# Patient Record
Sex: Male | Born: 1950 | Hispanic: No | Marital: Married | Smoking: Never smoker
Health system: Southern US, Community
[De-identification: ages and names within clinical notes are randomized; demographics above are authoritative.]

## PROBLEM LIST (undated history)

## (undated) DIAGNOSIS — G4730 Sleep apnea, unspecified: Secondary | ICD-10-CM

## (undated) DIAGNOSIS — N400 Enlarged prostate without lower urinary tract symptoms: Secondary | ICD-10-CM

## (undated) DIAGNOSIS — K589 Irritable bowel syndrome without diarrhea: Secondary | ICD-10-CM

## (undated) DIAGNOSIS — F329 Major depressive disorder, single episode, unspecified: Secondary | ICD-10-CM

## (undated) DIAGNOSIS — Z4502 Encounter for adjustment and management of automatic implantable cardiac defibrillator: Secondary | ICD-10-CM

## (undated) DIAGNOSIS — K760 Fatty (change of) liver, not elsewhere classified: Secondary | ICD-10-CM

## (undated) DIAGNOSIS — I2510 Atherosclerotic heart disease of native coronary artery without angina pectoris: Secondary | ICD-10-CM

## (undated) DIAGNOSIS — I472 Ventricular tachycardia: Principal | ICD-10-CM

## (undated) DIAGNOSIS — K219 Gastro-esophageal reflux disease without esophagitis: Secondary | ICD-10-CM

## (undated) DIAGNOSIS — Z951 Presence of aortocoronary bypass graft: Secondary | ICD-10-CM

## (undated) DIAGNOSIS — E785 Hyperlipidemia, unspecified: Secondary | ICD-10-CM

## (undated) DIAGNOSIS — I255 Ischemic cardiomyopathy: Secondary | ICD-10-CM

## (undated) DIAGNOSIS — I509 Heart failure, unspecified: Secondary | ICD-10-CM

## (undated) HISTORY — DX: Sleep apnea, unspecified: G47.30

## (undated) HISTORY — DX: Fatty (change of) liver, not elsewhere classified: K76.0

## (undated) HISTORY — DX: Atherosclerotic heart disease of native coronary artery without angina pectoris: I25.10

## (undated) HISTORY — DX: Ischemic cardiomyopathy: I25.5

## (undated) HISTORY — DX: Encounter for adjustment and management of automatic implantable cardiac defibrillator: Z45.02

## (undated) HISTORY — DX: Gastro-esophageal reflux disease without esophagitis: K21.9

## (undated) HISTORY — DX: Ventricular tachycardia: I47.2

## (undated) HISTORY — DX: Major depressive disorder, single episode, unspecified: F32.9

## (undated) HISTORY — DX: Irritable bowel syndrome, unspecified: K58.9

## (undated) HISTORY — DX: Hyperlipidemia, unspecified: E78.5

## (undated) HISTORY — DX: Benign prostatic hyperplasia without lower urinary tract symptoms: N40.0

## (undated) HISTORY — DX: Presence of aortocoronary bypass graft: Z95.1

## (undated) HISTORY — PX: APPENDECTOMY: SHX54

---

## 1997-10-24 HISTORY — PX: CORONARY ARTERY BYPASS GRAFT: SHX141

## 1998-08-25 ENCOUNTER — Encounter (HOSPITAL_COMMUNITY): Admission: RE | Admit: 1998-08-25 | Discharge: 1998-11-23 | Payer: Self-pay | Admitting: Interventional Cardiology

## 1998-11-24 ENCOUNTER — Encounter (HOSPITAL_COMMUNITY): Admission: RE | Admit: 1998-11-24 | Discharge: 1999-02-22 | Payer: Self-pay | Admitting: Interventional Cardiology

## 2000-03-21 ENCOUNTER — Encounter: Payer: Self-pay | Admitting: Emergency Medicine

## 2000-03-21 ENCOUNTER — Emergency Department (HOSPITAL_COMMUNITY): Admission: EM | Admit: 2000-03-21 | Discharge: 2000-03-21 | Payer: Self-pay | Admitting: Emergency Medicine

## 2000-03-22 ENCOUNTER — Encounter: Payer: Self-pay | Admitting: Family Medicine

## 2000-03-22 ENCOUNTER — Ambulatory Visit (HOSPITAL_COMMUNITY): Admission: RE | Admit: 2000-03-22 | Discharge: 2000-03-22 | Payer: Self-pay | Admitting: Family Medicine

## 2000-03-28 ENCOUNTER — Encounter: Payer: Self-pay | Admitting: Family Medicine

## 2000-03-28 ENCOUNTER — Ambulatory Visit (HOSPITAL_COMMUNITY): Admission: RE | Admit: 2000-03-28 | Discharge: 2000-03-28 | Payer: Self-pay | Admitting: Family Medicine

## 2001-10-15 ENCOUNTER — Encounter (INDEPENDENT_AMBULATORY_CARE_PROVIDER_SITE_OTHER): Payer: Self-pay | Admitting: *Deleted

## 2001-10-15 ENCOUNTER — Ambulatory Visit (HOSPITAL_COMMUNITY): Admission: RE | Admit: 2001-10-15 | Discharge: 2001-10-15 | Payer: Self-pay | Admitting: *Deleted

## 2008-02-05 ENCOUNTER — Ambulatory Visit (HOSPITAL_COMMUNITY): Admission: RE | Admit: 2008-02-05 | Discharge: 2008-02-05 | Payer: Self-pay | Admitting: Family Medicine

## 2008-08-26 ENCOUNTER — Ambulatory Visit (HOSPITAL_COMMUNITY): Admission: RE | Admit: 2008-08-26 | Discharge: 2008-08-26 | Payer: Self-pay | Admitting: Family Medicine

## 2008-08-28 ENCOUNTER — Ambulatory Visit (HOSPITAL_COMMUNITY): Admission: RE | Admit: 2008-08-28 | Discharge: 2008-08-28 | Payer: Self-pay | Admitting: Family Medicine

## 2010-06-09 ENCOUNTER — Inpatient Hospital Stay (HOSPITAL_COMMUNITY): Admission: EM | Admit: 2010-06-09 | Discharge: 2010-06-11 | Payer: Self-pay | Admitting: Emergency Medicine

## 2010-06-10 ENCOUNTER — Encounter (INDEPENDENT_AMBULATORY_CARE_PROVIDER_SITE_OTHER): Payer: Self-pay | Admitting: Internal Medicine

## 2010-06-11 ENCOUNTER — Encounter (INDEPENDENT_AMBULATORY_CARE_PROVIDER_SITE_OTHER): Payer: Self-pay | Admitting: Internal Medicine

## 2010-06-11 HISTORY — PX: COLONOSCOPY: SHX174

## 2010-06-11 LAB — HM COLONOSCOPY

## 2011-01-06 LAB — GIARDIA/CRYPTOSPORIDIUM SCREEN(EIA)
Cryptosporidium Screen (EIA): NEGATIVE
Giardia Screen - EIA: NEGATIVE

## 2011-01-06 LAB — STOOL CULTURE

## 2011-01-06 LAB — BASIC METABOLIC PANEL
Calcium: 8.8 mg/dL (ref 8.4–10.5)
Calcium: 9 mg/dL (ref 8.4–10.5)
Creatinine, Ser: 1.57 mg/dL — ABNORMAL HIGH (ref 0.4–1.5)
GFR calc Af Amer: 55 mL/min — ABNORMAL LOW (ref >60)
GFR calc Af Amer: >60 mL/min (ref >60)
GFR calc non Af Amer: 45 mL/min — ABNORMAL LOW (ref >60)
GFR calc non Af Amer: 56 mL/min — ABNORMAL LOW (ref >60)
Glucose, Bld: 108 mg/dL — ABNORMAL HIGH (ref 70–99)
Glucose, Bld: 111 mg/dL — ABNORMAL HIGH (ref 70–99)
Potassium: 3.8 mEq/L (ref 3.5–5.1)
Sodium: 139 mEq/L (ref 135–145)
Sodium: 143 mEq/L (ref 135–145)

## 2011-01-06 LAB — DIFFERENTIAL
Eosinophils Absolute: 0 10*3/uL (ref 0.0–0.7)
Eosinophils Relative: 1 % (ref 0–5)
Lymphocytes Relative: 17 % (ref 12–46)
Lymphs Abs: 1.1 10*3/uL (ref 0.7–4.0)
Monocytes Absolute: 1.2 10*3/uL — ABNORMAL HIGH (ref 0.1–1.0)
Monocytes Relative: 19 % — ABNORMAL HIGH (ref 3–12)
Neutro Abs: 4 10*3/uL (ref 1.7–7.7)

## 2011-01-06 LAB — CBC
HCT: 40.7 % (ref 39.0–52.0)
Hemoglobin: 13.8 g/dL (ref 13.0–17.0)
MCHC: 33.9 g/dL (ref 30.0–36.0)
MCHC: 34.4 g/dL (ref 30.0–36.0)
Platelets: 157 10*3/uL (ref 150–400)
RBC: 4.49 MIL/uL (ref 4.22–5.81)
WBC: 4.9 10*3/uL (ref 4.0–10.5)

## 2011-01-06 LAB — POCT I-STAT, CHEM 8
Creatinine, Ser: 2 mg/dL — ABNORMAL HIGH (ref 0.4–1.5)
Potassium: 3.9 mEq/L (ref 3.5–5.1)
Sodium: 136 mEq/L (ref 135–145)

## 2011-01-06 LAB — POCT I-STAT 3, ART BLOOD GAS (G3+)
Acid-base deficit: 3 mmol/L — ABNORMAL HIGH (ref 0.0–2.0)
Bicarbonate: 21.6 mEq/L (ref 20.0–24.0)
O2 Saturation: 95 %
Patient temperature: 98
TCO2: 23 mmol/L (ref 0–100)
pCO2 arterial: 36.8 mmHg (ref 35.0–45.0)
pH, Arterial: 7.376 (ref 7.350–7.450)
pO2, Arterial: 78 mmHg — ABNORMAL LOW (ref 80.0–100.0)

## 2011-01-06 LAB — CARDIAC PANEL(CRET KIN+CKTOT+MB+TROPI)
CK, MB: 0.8 ng/mL (ref 0.3–4.0)
Relative Index: INVALID (ref 0.0–2.5)
Total CK: 74 U/L (ref 7–232)
Troponin I: 0.01 ng/mL (ref 0.00–0.06)

## 2011-01-06 LAB — CK TOTAL AND CKMB (NOT AT ARMC): Total CK: 92 U/L (ref 7–232)

## 2011-01-06 LAB — CULTURE, BLOOD (ROUTINE X 2): Culture: NO GROWTH

## 2011-01-06 LAB — LIPID PANEL
Cholesterol: 117 mg/dL (ref 0–200)
HDL: 27 mg/dL — ABNORMAL LOW (ref >39)
Total CHOL/HDL Ratio: 4.3 RATIO
Triglycerides: 198 mg/dL — ABNORMAL HIGH (ref <150)

## 2011-01-06 LAB — URINALYSIS, ROUTINE W REFLEX MICROSCOPIC
Glucose, UA: NEGATIVE mg/dL
Protein, ur: NEGATIVE mg/dL
Specific Gravity, Urine: 1.015 (ref 1.005–1.030)

## 2011-01-06 LAB — CLOSTRIDIUM DIFFICILE EIA
C difficile Toxins A+B, EIA: NEGATIVE
C difficile Toxins A+B, EIA: NEGATIVE

## 2011-01-06 LAB — DIGOXIN LEVEL: Digoxin Level: 0.2 ng/mL — ABNORMAL LOW (ref 0.8–2.0)

## 2011-01-06 LAB — POCT CARDIAC MARKERS: Myoglobin, poc: 121 ng/mL (ref 12–200)

## 2011-01-06 LAB — HEMOGLOBIN A1C
Hgb A1c MFr Bld: 5.7 % — ABNORMAL HIGH (ref <5.7)
Mean Plasma Glucose: 117 mg/dL — ABNORMAL HIGH (ref <117)

## 2011-01-06 LAB — PROTIME-INR
INR: 1.15 (ref 0.00–1.49)
Prothrombin Time: 14.9 seconds (ref 11.6–15.2)

## 2011-01-06 LAB — LACTIC ACID, PLASMA: Lactic Acid, Venous: 1.6 mmol/L (ref 0.5–2.2)

## 2012-01-13 ENCOUNTER — Other Ambulatory Visit: Payer: Self-pay | Admitting: Cardiology

## 2012-01-13 ENCOUNTER — Ambulatory Visit
Admission: RE | Admit: 2012-01-13 | Discharge: 2012-01-13 | Disposition: A | Discharge status: Home / Self Care | Payer: BC Managed Care – PPO | Source: Ambulatory Visit | Attending: Cardiology | Admitting: Cardiology

## 2012-01-13 DIAGNOSIS — I2510 Atherosclerotic heart disease of native coronary artery without angina pectoris: Secondary | ICD-10-CM

## 2012-01-18 ENCOUNTER — Other Ambulatory Visit: Payer: Self-pay | Admitting: Cardiology

## 2012-01-18 ENCOUNTER — Encounter: Payer: Self-pay | Admitting: Cardiology

## 2012-01-18 DIAGNOSIS — G4730 Sleep apnea, unspecified: Secondary | ICD-10-CM

## 2012-01-18 DIAGNOSIS — I255 Ischemic cardiomyopathy: Secondary | ICD-10-CM

## 2012-01-18 DIAGNOSIS — I2510 Atherosclerotic heart disease of native coronary artery without angina pectoris: Secondary | ICD-10-CM

## 2012-01-18 DIAGNOSIS — R5383 Other fatigue: Secondary | ICD-10-CM

## 2012-01-18 DIAGNOSIS — Z951 Presence of aortocoronary bypass graft: Secondary | ICD-10-CM

## 2012-01-18 DIAGNOSIS — F32A Depression, unspecified: Secondary | ICD-10-CM

## 2012-01-18 DIAGNOSIS — F329 Major depressive disorder, single episode, unspecified: Secondary | ICD-10-CM | POA: Insufficient documentation

## 2012-01-18 DIAGNOSIS — E785 Hyperlipidemia, unspecified: Secondary | ICD-10-CM

## 2012-01-18 NOTE — H&P (Signed)
Paul [name]  Date of visit:  01/13/2012 DOB:  [birth year]    Age:  60 yrs. Medical record number:  [id number]     Account number:  [id number] Primary Care Provider: BADGER,MICHAEL C  CURRENT DIAGNOSES  1. Cardiomyopathy Ischemic  2. Hyperlipidemia  3. Surgery-Aortocoronary Bypass Grafting  4. Sleep apnea  5. Chest pain, other  6. CAD,Native  7. Fatigue/malaise  ALLERGIES  Coreg, Fatigue  Lexapro  Niaspan Extended-Release, Flushing  spironolactone, Gynecomastia  MEDICATIONS  1. vitamin E 400 unit capsule, 800u qd  2. Librax (with Clidinium) 5-2.5 mg Capsule, TID  3. CoQ10 SG 100 100-100 mg-unit Capsule, 1/2 tab daily  4. Multivitamins Tab, 1 p.o. daily  5. Vitamin C 1,000 mg Tablet, 1 p.o. daily  6. Lomotil 2.5-0.025 mg Tablet, PRN  7. nitroglycerin 0.4 mg Tablet, Sublingual, PRN  8. Nexium Packet 40 mg Susp,Delayed Release for Recon, 1 p.o. daily  9. doxepin 25 mg Capsule, QHS  10. INSPRA 25 mg Tablet, 1 p.o. daily  11. aspirin 81 mg tablet, chewable, 1 p.o. daily  12. Levitra 10 mg tablet, Take as directed  13. Lanoxin 125 mcg tablet, 1 p.o. daily  14. metoprolol succinate 100 mg tablet extended release 24 hr, 1 p.o. daily  15. ramipril 10 mg capsule, 1 p.o. daily  16. Plavix 75 mg tablet, 1 p.o. q.d.  17. Crestor 40 mg tablet, 1 p.o. daily  18. Allegra 180 mg tablet, 1 p.o. daily  CHIEF COMPLAINTS  Followup of CAD,Native  HISTORY OF PRESENT ILLNESS  Patient seen for cardiac followup. He remains depressed and complains of significant fatigue. He states that he is seeing his primary care physician and that he has had several changes in his medicines including he switch from metoprolol to Bystolic without change in symptoms as well as adjustments in several of his other medications. He complains of significant fatigue and feels unmotivated to exercise. He is stressed out over his work and his not enjoy his job and states that he is fatigued and sleeps about 10 hours per  day. He is quite worried about his cardiac grafts. I could not get a definite history of angina. He denies PND, orthopnea, edema, or claudication.  PAST HISTORY  Past Medical Illnesses:  hyperlipidemia, GERD, sleep apnea, irritable bowel syndrome;  Cardiovascular Illnesses:  CAD, cardiomyopathy(ischemic), S/P MI-inferior;  Surgical Procedures:  appendectomy, CABG;  Cardiology Procedures-Invasive:  cardiac cath (left) March 2005, CABG w Lima to LAD, SVG to OM, SVG to PD-PL  September1999;  Cardiology Procedures-Noninvasive:  cardiac CT 10/12/05 showing occlusion of SVG to OM, and patent LIMA and RCA grafts, T wave alternans, echocardiogram August 2011, treadmill cardiolite April 2012;  Cardiac Cath Results:  widely patent LAD LIMA graft, widely patent PDA PLR SVG, occluded Diag 1 OM 1 SVG;  LVEF of 35% documented via echocardiogram on 07/13/2011  CARDIO-PULMONARY TEST DATES EKG Date:  01/13/2012;  Nuclear Study Date:  02/10/2011;  Echocardiography Date: 07/13/2011;  Chest Xray Date: 01/13/2012;  CT Scan Date:  10/12/2005    SOCIAL HISTORY Alcohol Use:  wine 1-2 per day;  Smoking:  nonsmoker;  Diet:  regular diet;  Lifestyle:  married, 1 son and 1 daughter;  Exercise:  no regular exercise;  Occupation:  Pharmacist, community;  Residence:  lives with wife;    REVIEW OF SYSTEMS General:  mild obesity, malaise and fatigue, weight gain of approximately 5 lbs  Eyes:  wears eye glasses/contact lenses Ears, Nose, Throat, Mouth:  seasonal sinusitis  Respiratory:  denies dyspnea, cough, wheezing or hemoptysis.  Cardiovascular:  please review HPI Abdominal: Occasional dyspepsia and constipation Genitourinary-Male:  hesitancy, nocturia  Musculoskeletal:  arthritis of the left ankle  Psychiatric:  situational stress, chronic depression  PHYSICAL EXAMINATION VITAL SIGNS  Blood Pressure:  120/70 Sitting, Right arm, regular cuff  , 118/68 Standing, Right arm and regular cuff   Pulse:  78/min. Weight:   192.00 lbs. Height:  68"BMI: 29  Constitutional:  fretful depressed  appearing white male in no acute distress Skin:  warm and dry to touch, no apparent skin lesions, or masses noted. Head:  normocephalic, normal hair pattern, no masses or tenderness ENT:  ears, nose and throat reveal no gross abnormalities.  Dentition good. Neck:  supple, no masses, thyromegaly, JVD. Carotid pulses are full and equal bilaterally without bruits. Chest:  normal symmetry, clear to auscultation and percussion., healed median sternotomy scar Cardiac:  regular rhythm, normal S1 and S2, No S3 or S4, no murmurs, gallops or rubs detected. Peripheral Pulses:  the femoral,dorsalis pedis, and posterior tibial pulses are full and equal bilaterally with no bruits auscultated. Extremities & Back:  well healed saphenous vein donor site LLE Neurological:  no gross motor or sensory deficits noted, affect appropriate, oriented x3.  MOST RECENT LIPID PANEL 12/23/10  CHOL TOTL 175 mg/dl, LDL 098 Calc , HDL 38 mg/dl, TRIGLYCER 119 mg/dl,   IMPRESSIONS/PLAN  1. Excessive fatigue as well as dyspnea on exertion in a patient with depressed LV function and a previously unremarkable Cardiolite 2. Coronary artery disease with previous bypass grafting in 1999 3. Significant situational stress and depression 4. Hyperlipidemia under treatment 5. History of sleep apnea  Recommendations:  Long discussion with patient concerning symptoms. He is very worried about his cardiovascular status and is quite anxious and depressed also. He had a previously occluded graft when last assessed over 6 years ago. His graft age he is working years. After a long discussion with the patient I recommended that we proceed with cardiac catheterization. This will assess graft patency and enable [place] to plan further evaluation and workup of his fatigue and exercise intolerance. I still wonder about the contribution of depression.  Cardiac catheterization was  discussed with the patient fully including risks of myocardial infarction, death, stroke, bleeding, arrhythmia, dye allergy, renal insufficiency. He understands and is willing to proceed.  EKG shows a previous inferior infarction.    Cardiology Physician:  Darden Palmer MD [facility]

## 2012-01-19 ENCOUNTER — Encounter (HOSPITAL_BASED_OUTPATIENT_CLINIC_OR_DEPARTMENT_OTHER): Payer: Self-pay | Admitting: *Deleted

## 2012-01-19 ENCOUNTER — Encounter (HOSPITAL_BASED_OUTPATIENT_CLINIC_OR_DEPARTMENT_OTHER)
Admission: RE | Disposition: A | Discharge status: Home / Self Care | Payer: Self-pay | Source: Ambulatory Visit | Attending: Cardiology

## 2012-01-19 ENCOUNTER — Inpatient Hospital Stay (HOSPITAL_BASED_OUTPATIENT_CLINIC_OR_DEPARTMENT_OTHER)
Admission: RE | Admit: 2012-01-19 | Discharge: 2012-01-19 | Disposition: A | Discharge status: Home / Self Care | Payer: BC Managed Care – PPO | Source: Ambulatory Visit | Attending: Cardiology | Admitting: Cardiology

## 2012-01-19 DIAGNOSIS — I25810 Atherosclerosis of coronary artery bypass graft(s) without angina pectoris: Secondary | ICD-10-CM | POA: Insufficient documentation

## 2012-01-19 DIAGNOSIS — G4730 Sleep apnea, unspecified: Secondary | ICD-10-CM | POA: Insufficient documentation

## 2012-01-19 DIAGNOSIS — E785 Hyperlipidemia, unspecified: Secondary | ICD-10-CM | POA: Insufficient documentation

## 2012-01-19 DIAGNOSIS — I2510 Atherosclerotic heart disease of native coronary artery without angina pectoris: Secondary | ICD-10-CM | POA: Insufficient documentation

## 2012-01-19 DIAGNOSIS — I252 Old myocardial infarction: Secondary | ICD-10-CM | POA: Insufficient documentation

## 2012-01-19 DIAGNOSIS — I428 Other cardiomyopathies: Secondary | ICD-10-CM | POA: Insufficient documentation

## 2012-01-19 DIAGNOSIS — I472 Ventricular tachycardia: Secondary | ICD-10-CM

## 2012-01-19 SURGERY — JV LEFT HEART CATHETERIZATION WITH CORONARY/GRAFT ANGIOGRAM
Anesthesia: Moderate Sedation

## 2012-01-19 MED ORDER — SODIUM CHLORIDE 0.9 % IV SOLN: 1.0000 mL/kg/h | INTRAVENOUS | Status: DC

## 2012-01-19 MED ORDER — ACETAMINOPHEN 325 MG PO TABS: 650.0000 mg | ORAL_TABLET | ORAL | Status: DC | PRN

## 2012-01-19 MED ORDER — DIAZEPAM 5 MG PO TABS
10.0000 mg | ORAL_TABLET | ORAL | Status: AC
  Administered 2012-01-19: 10 mg via ORAL

## 2012-01-19 MED ORDER — SODIUM CHLORIDE 0.9 % IV SOLN
INTRAVENOUS | Status: DC
  Administered 2012-01-19: 07:00:00 via INTRAVENOUS

## 2012-01-19 NOTE — Progress Notes (Signed)
Discharge instructions completed, ambulated to bathroom without bleeding from right groin site.  Discharged to home via wheelchair with wife. 

## 2012-01-19 NOTE — Interval H&P Note (Signed)
History and Physical Interval Note:  01/19/2012 7:42 AM    Patient seen and examined.  No interval change in history and exam since last note.  Stable for procedure.  Darden Palmer. MD [facility]  01/19/2012      Donnie Aho JR,W SPENCER

## 2012-01-19 NOTE — H&P (View-Only) (Signed)
 Paul [name], Paul [name]  Date of visit:  01/13/2012 DOB:  09/14/1951    Age:  60 yrs. Medical record number:  [id number]     Account number:  [id number] Primary Care Provider: BADGER,MICHAEL C  CURRENT DIAGNOSES  1. Cardiomyopathy Ischemic  2. Hyperlipidemia  3. Surgery-Aortocoronary Bypass Grafting  4. Sleep apnea  5. Chest pain, other  6. CAD,Native  7. Fatigue/malaise  ALLERGIES  Coreg, Fatigue  Lexapro  Niaspan Extended-Release, Flushing  spironolactone, Gynecomastia  MEDICATIONS  1. vitamin E 400 unit capsule, 800u qd  2. Librax (with Clidinium) 5-2.5 mg Capsule, TID  3. CoQ10 SG 100 100-100 mg-unit Capsule, 1/2 tab daily  4. Multivitamins Tab, 1 p.o. daily  5. Vitamin C 1,000 mg Tablet, 1 p.o. daily  6. Lomotil 2.5-0.025 mg Tablet, PRN  7. nitroglycerin 0.4 mg Tablet, Sublingual, PRN  8. Nexium Packet 40 mg Susp,Delayed Release for Recon, 1 p.o. daily  9. doxepin 25 mg Capsule, QHS  10. INSPRA 25 mg Tablet, 1 p.o. daily  11. aspirin 81 mg tablet, chewable, 1 p.o. daily  12. Levitra 10 mg tablet, Take as directed  13. Lanoxin 125 mcg tablet, 1 p.o. daily  14. metoprolol succinate 100 mg tablet extended release 24 hr, 1 p.o. daily  15. ramipril 10 mg capsule, 1 p.o. daily  16. Plavix 75 mg tablet, 1 p.o. q.d.  17. Crestor 40 mg tablet, 1 p.o. daily  18. Allegra 180 mg tablet, 1 p.o. daily  CHIEF COMPLAINTS  Followup of CAD,Native  HISTORY OF PRESENT ILLNESS  Patient seen for cardiac followup. He remains depressed and complains of significant fatigue. He states that he is seeing his primary care physician and that he has had several changes in his medicines including he switch from metoprolol to Bystolic without change in symptoms as well as adjustments in several of his other medications. He complains of significant fatigue and feels unmotivated to exercise. He is stressed out over his work and his not enjoy his job and states that he is fatigued and sleeps about 10 hours per  day. He is quite worried about his cardiac grafts. I could not get a definite history of angina. He denies PND, orthopnea, edema, or claudication.  PAST HISTORY  Past Medical Illnesses:  hyperlipidemia, GERD, sleep apnea, irritable bowel syndrome;  Cardiovascular Illnesses:  CAD, cardiomyopathy(ischemic), S/P MI-inferior;  Surgical Procedures:  appendectomy, CABG;  Cardiology Procedures-Invasive:  cardiac cath (left) March 2005, CABG w Lima to LAD, SVG to OM, SVG to PD-PL  September1999;  Cardiology Procedures-Noninvasive:  cardiac CT 10/12/05 showing occlusion of SVG to OM, and patent LIMA and RCA grafts, T wave alternans, echocardiogram August 2011, treadmill cardiolite April 2012;  Cardiac Cath Results:  widely patent LAD LIMA graft, widely patent PDA PLR SVG, occluded Diag 1 OM 1 SVG;  LVEF of 35% documented via echocardiogram on 07/13/2011  CARDIO-PULMONARY TEST DATES EKG Date:  01/13/2012;  Nuclear Study Date:  02/10/2011;  Echocardiography Date: 07/13/2011;  Chest Xray Date: 01/13/2012;  CT Scan Date:  10/12/2005    SOCIAL HISTORY Alcohol Use:  wine 1-2 per day;  Smoking:  nonsmoker;  Diet:  regular diet;  Lifestyle:  married, 1 son and 1 daughter;  Exercise:  no regular exercise;  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  Cardiovascular:  please review HPI Abdominal: Occasional dyspepsia and constipation Genitourinary-Male:  hesitancy, nocturia  Musculoskeletal:  arthritis of the left ankle  Psychiatric:  situational stress, chronic depression  PHYSICAL EXAMINATION VITAL SIGNS  Blood Pressure:  120/70 Sitting, Right arm, regular cuff  , 118/68 Standing, Right arm and regular cuff   Pulse:  78/min. Weight:   192.00 lbs. Height:  68"BMI: 29  Constitutional:  fretful depressed  appearing white male in no acute distress Skin:  warm and dry to touch, no apparent skin lesions, or masses noted. Head:  normocephalic, normal hair pattern, no masses or tenderness ENT:  ears, nose and throat reveal no gross abnormalities.  Dentition good. Neck:  supple, no masses, thyromegaly, JVD. Carotid pulses are full and equal bilaterally without bruits. Chest:  normal symmetry, clear to auscultation and percussion., healed median sternotomy scar Cardiac:  regular rhythm, normal S1 and S2, No S3 or S4, no murmurs, gallops or rubs detected. Peripheral Pulses:  the femoral,dorsalis pedis, and posterior tibial pulses are full and equal bilaterally with no bruits auscultated. Extremities & Back:  well healed saphenous vein donor site LLE Neurological:  no gross motor or sensory deficits noted, affect appropriate, oriented x3.  MOST RECENT LIPID PANEL 12/23/10  CHOL TOTL 175 mg/dl, LDL 111 Calc , HDL 38 mg/dl, TRIGLYCER 132 mg/dl,   IMPRESSIONS/PLAN  1. Excessive fatigue as well as dyspnea on exertion in a patient with depressed LV function and a previously unremarkable Cardiolite 2. Coronary artery disease with previous bypass grafting in 1999 3. Significant situational stress and depression 4. Hyperlipidemia under treatment 5. History of sleep apnea  Recommendations:  Long discussion with patient concerning symptoms. He is very worried about his cardiovascular status and is quite anxious and depressed also. He had a previously occluded graft when last assessed over 6 years ago. His graft age he is working years. After a long discussion with the patient I recommended that we proceed with cardiac catheterization. This will assess graft patency and enable us to plan further evaluation and workup of his fatigue and exercise intolerance. I still wonder about the contribution of depression.  Cardiac catheterization was  discussed with the patient fully including risks of myocardial infarction, death, stroke, bleeding, arrhythmia, dye allergy, renal insufficiency. He understands and is willing to proceed.  EKG shows a previous inferior infarction.    Cardiology Physician:  W. Spencer Davanna He, Jr. MD [facility]    

## 2012-01-19 NOTE — Progress Notes (Deleted)
Patient ID: Paul [name], male   DOB: 06-09-1951, 61 y.o.   MRN: [id number] Subjective:  No chest pain.   Objective:  Vital Signs in the last 24 hours: Pulse Rate:  [55-59] 59  (03/28 0844) Resp:  [16] 16  (03/28 0844) BP: (112-132)/(8-75) 132/8 mmHg (03/28 0844) SpO2:  [96 %-98 %] 98 % (03/28 0844) Weight:  [87.091 kg (192 lb)] 87.091 kg (192 lb) (03/28 0659)  Intake/Output from previous day:   Intake/Output from this shift:    Physical Exam: Chronically ill appearing NAD HEENT: Unremarkable Neck:  No JVD, no thyromegally Lymphatics:  No adenopathy Back:  No CVA tenderness Lungs:  Clear with no wheezes HEART:  Regular rate rhythm, no murmurs, no rubs, no clicks. Soft S3. Abd:  Flat, positive bowel sounds, no organomegally, no rebound, no guarding Ext:  2 plus pulses, no edema, no cyanosis, no clubbing Skin:  No rashes no nodules Neuro:  CN II through XII intact, motor grossly intact  Lab Results: No results found for this basename: WBC:2,HGB:2,PLT:2 in the last 72 hours No results found for this basename: NA:2,K:2,CL:2,CO2:2,GLUCOSE:2,BUN:2,CREATININE:2 in the last 72 hours No results found for this basename: TROPONINI:2,CK,MB:2 in the last 72 hours Hepatic Function Panel No results found for this basename: PROT,ALBUMIN,AST,ALT,ALKPHOS,BILITOT,BILIDIR,IBILI in the last 72 hours No results found for this basename: CHOL in the last 72 hours No results found for this basename: PROTIME in the last 72 hours  Imaging: No results found.  Cardiac Studies: Tele - NSR with PVC's. Assessment/Plan:  1. VT - s/p ICD shock. Agree with initiation of Mexilitine 2. Hypokalemia/magnesemia - replete 3. Acute/chronic renal failure - will follow renal function. 4. Low output chronic systolic heart failure - agree with holding milrinone secondary to concerns about exacerbating ventricular ectopy.   LOS: 0 days    Lewayne Bunting 01/19/2012, 8:59 AM

## 2012-01-19 NOTE — Progress Notes (Signed)
Bedrest begins @ 0840.

## 2012-01-19 NOTE — Discharge Instructions (Addendum)
Heart Catheterization Home Care    A catheter was placed through the blood vessel in your groin, contrast was injected into the vessels, and pictures were taken.   You may feel some discomfort at the insertion site after the local anesthetic wears off. This discomfort should gradually improve over the next several days.  Only take over-the-counter or prescription medicines for pain, discomfort, or fever as directed by your caregiver.   Complications are very uncommon after this procedure.Call my office if you develop any of the following symptoms:   Worsening pain.   Bleeding.   Severe swelling at the puncture site.   Lightheadedness.   Dizziness or fainting.   Fever or chills.   If oozing, bleeding, or a lump appears at the puncture site, apply firm pressure directly to the site steadily for 15 minutes and go to the emergency department.   Keep the skin around the insertion site dry. You may take showers after 24 hours. If the area does get wet, dry the skin completely. Avoid baths until the skin puncture site heals, usually 5 to 7 days.   Rest  for the remainder of the day and avoid any heavy lifting (more than 10 pounds or 4.5 kg). Do not operate heavy machinery, drive, or make legal decisions for the first 24 hours after the procedure. Have a responsible person drive you home.   You may resume your usual diet after the procedure. Avoid alcoholic beverages for 24 hours after the procedure.    Continue all of your own home medications.

## 2012-01-19 NOTE — CV Procedure (Addendum)
CARDIAC CATHETERIZATION REPORT   Paul [name]    61 y.o. male  DOB: 1951/10/07  MRN: [id number]    01/19/2012    PROCEDURE:  Left heart catheterization with selective coronary angiography, bypass graft angiograms, and left ventriculogram.  INDICATIONS:  Previous bypass grafting, previous inferior infarction, excessive fatigue as well as shortness of breath. The risks, benefits, and details of the procedure were explained to the patient.  The patient verbalized understanding and wanted to proceed.  Informed written consent was obtained.  PROCEDURE TECHNIQUE:  After Xylocaine anesthesia a 44F sheath was placed in the right femoral artery with a single anterior needle wall stick.   Left coronary angiography was done using a Judkins L4 guide catheter.  Right coronary angiography was done using a Judkins R4 guide catheter. . The LIMA was selected with the right coronary catheter. The bypass grafts were selected using a right coronary bypass catheter.  Left ventriculography was done using a pigtail catheter with 30 cc of contrast. The patient tolerated the procedure well and was returned to the holding area for sheath removal.   CONTRAST:   110 cc.  COMPLICATIONS:  None.    HEMODYNAMICS:  Aortic postcontrast 120/63 left ventricle postcontrast 120/5-16. There was no gradient between the left ventricle and aorta.    ANGIOGRAPHIC DATA:    CORONARY ARTERIES:   Arise and distribute normally.  Right dominant. Mild-to-moderate coronary calcification is noted.  Left main coronary artery: Mild irregularity  Left anterior descending: Mild calcification and irregularity proximally. Severe 95% stenosis prior to a small moderate sized diagonal branch. The vessel is then occluded and fills by means of a patent mammary artery.  Circumflex coronary artery: First marginal branch is free of disease, there is then a severe 80-90% stenosis in the proximal circumflex after  the origin of the first marginal branch which supplies mainly a large second marginal branch.  Right coronary artery: Severe 80% stenosis proximally. The distal vessels fill largely through patent bypass grafts but there are also collaterals of the distal vessel coming from the left coronary system through the septal perforators. The posterior descending and posterior role.  Internal mammary graft to LAD: Widely patent with a patent distal anastomotic site. The anterior descending appears to terminate at the apex.  Saphenous vein graft to the obtuse marginal is occluded Saphenous vein graft to right coronary artery: This vessel has 2 limbs to it. One limb goes to the posterior lateral branch and is widely patent. 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 The insertion site is patent. There is somewhat slow flow of this limb of the graft.  LEFT VENTRICULOGRAM:  Performed in the 30 RAO projection.  The aortic and mitral valves are normal. The left ventricle is dilated with severe hypokinesis of the inferior wall. The estimated ejection fraction is 35%.  IMPRESSIONS:  1. Significant native vessel coronary artery disease with occlusion of the distal LAD, severe proximal stenosis the right coronary artery, proximal circumflex coronary artery, and diagonal branch. 2. 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 Potential sites of ischemia exists in the diagonal branch, distal right coronary circulation and circumflex. 4. Abnormal left ventricular r function with severe inferior hypokinesis and ejection fraction of 35%.  RECOMMENDATION:    Continue medical therapy. I will have an interventional cardiologist review the films to see if  percutaneous intervention would be of use although the vessels involved appear small and I am not convinced this would make a large difference.   Darden Palmer MD [facility]

## 2014-07-24 DIAGNOSIS — I472 Ventricular tachycardia, unspecified: Secondary | ICD-10-CM

## 2014-07-24 HISTORY — DX: Ventricular tachycardia, unspecified: I47.20

## 2014-07-24 HISTORY — DX: Ventricular tachycardia: I47.2

## 2014-07-29 ENCOUNTER — Emergency Department (HOSPITAL_COMMUNITY): Payer: BC Managed Care – PPO

## 2014-07-29 ENCOUNTER — Inpatient Hospital Stay (HOSPITAL_COMMUNITY)
Admission: EM | Admit: 2014-07-29 | Discharge: 2014-08-01 | DRG: 243 | Disposition: A | Discharge status: Home / Self Care | Payer: BC Managed Care – PPO | Attending: Cardiology | Admitting: Cardiology

## 2014-07-29 ENCOUNTER — Encounter (HOSPITAL_COMMUNITY): Payer: Self-pay | Admitting: Emergency Medicine

## 2014-07-29 DIAGNOSIS — G4730 Sleep apnea, unspecified: Secondary | ICD-10-CM

## 2014-07-29 DIAGNOSIS — Z951 Presence of aortocoronary bypass graft: Secondary | ICD-10-CM | POA: Diagnosis not present

## 2014-07-29 DIAGNOSIS — Z6829 Body mass index (BMI) 29.0-29.9, adult: Secondary | ICD-10-CM

## 2014-07-29 DIAGNOSIS — E669 Obesity, unspecified: Secondary | ICD-10-CM | POA: Diagnosis present

## 2014-07-29 DIAGNOSIS — F329 Major depressive disorder, single episode, unspecified: Secondary | ICD-10-CM | POA: Diagnosis present

## 2014-07-29 DIAGNOSIS — Z7982 Long term (current) use of aspirin: Secondary | ICD-10-CM | POA: Diagnosis not present

## 2014-07-29 DIAGNOSIS — E860 Dehydration: Secondary | ICD-10-CM | POA: Diagnosis present

## 2014-07-29 DIAGNOSIS — I248 Other forms of acute ischemic heart disease: Secondary | ICD-10-CM | POA: Diagnosis present

## 2014-07-29 DIAGNOSIS — Z7901 Long term (current) use of anticoagulants: Secondary | ICD-10-CM

## 2014-07-29 DIAGNOSIS — I252 Old myocardial infarction: Secondary | ICD-10-CM | POA: Diagnosis not present

## 2014-07-29 DIAGNOSIS — I25119 Atherosclerotic heart disease of native coronary artery with unspecified angina pectoris: Secondary | ICD-10-CM | POA: Diagnosis present

## 2014-07-29 DIAGNOSIS — E785 Hyperlipidemia, unspecified: Secondary | ICD-10-CM | POA: Diagnosis present

## 2014-07-29 DIAGNOSIS — I472 Ventricular tachycardia, unspecified: Secondary | ICD-10-CM

## 2014-07-29 DIAGNOSIS — I25790 Atherosclerosis of other coronary artery bypass graft(s) with unstable angina pectoris: Secondary | ICD-10-CM

## 2014-07-29 DIAGNOSIS — E876 Hypokalemia: Secondary | ICD-10-CM | POA: Diagnosis present

## 2014-07-29 DIAGNOSIS — I255 Ischemic cardiomyopathy: Secondary | ICD-10-CM | POA: Diagnosis present

## 2014-07-29 DIAGNOSIS — I10 Essential (primary) hypertension: Secondary | ICD-10-CM | POA: Diagnosis present

## 2014-07-29 DIAGNOSIS — I519 Heart disease, unspecified: Secondary | ICD-10-CM

## 2014-07-29 DIAGNOSIS — K219 Gastro-esophageal reflux disease without esophagitis: Secondary | ICD-10-CM | POA: Diagnosis present

## 2014-07-29 LAB — CBC
HCT: 42.4 % (ref 39.0–52.0)
Hemoglobin: 14.5 g/dL (ref 13.0–17.0)
MCH: 32.6 pg (ref 26.0–34.0)
MCHC: 34.2 g/dL (ref 30.0–36.0)
MCV: 95.3 fL (ref 78.0–100.0)
PLATELETS: 161 10*3/uL (ref 150–400)
RBC: 4.45 MIL/uL (ref 4.22–5.81)
RDW: 13.7 % (ref 11.5–15.5)
WBC: 6.2 10*3/uL (ref 4.0–10.5)

## 2014-07-29 LAB — TSH: TSH: 0.781 u[IU]/mL (ref 0.350–4.500)

## 2014-07-29 LAB — CBC WITH DIFFERENTIAL/PLATELET
Basophils Absolute: 0 10*3/uL (ref 0.0–0.1)
Basophils Relative: 0 % (ref 0–1)
EOS ABS: 0.1 10*3/uL (ref 0.0–0.7)
EOS PCT: 2 % (ref 0–5)
HEMATOCRIT: 44.2 % (ref 39.0–52.0)
Hemoglobin: 15.2 g/dL (ref 13.0–17.0)
LYMPHS ABS: 1.3 10*3/uL (ref 0.7–4.0)
LYMPHS PCT: 23 % (ref 12–46)
MCH: 32.5 pg (ref 26.0–34.0)
MCHC: 34.4 g/dL (ref 30.0–36.0)
MCV: 94.4 fL (ref 78.0–100.0)
MONO ABS: 0.7 10*3/uL (ref 0.1–1.0)
MONOS PCT: 11 % (ref 3–12)
Neutro Abs: 3.8 10*3/uL (ref 1.7–7.7)
Neutrophils Relative %: 64 % (ref 43–77)
Platelets: 172 10*3/uL (ref 150–400)
RBC: 4.68 MIL/uL (ref 4.22–5.81)
RDW: 13.6 % (ref 11.5–15.5)
WBC: 5.9 10*3/uL (ref 4.0–10.5)

## 2014-07-29 LAB — COMPREHENSIVE METABOLIC PANEL
ALT: 40 U/L (ref 0–53)
ANION GAP: 12 (ref 5–15)
AST: 48 U/L — ABNORMAL HIGH (ref 0–37)
Albumin: 3.7 g/dL (ref 3.5–5.2)
Alkaline Phosphatase: 53 U/L (ref 39–117)
BUN: 14 mg/dL (ref 6–23)
CALCIUM: 9 mg/dL (ref 8.4–10.5)
CO2: 24 meq/L (ref 19–32)
CREATININE: 1.51 mg/dL — AB (ref 0.50–1.35)
Chloride: 106 mEq/L (ref 96–112)
GFR, EST AFRICAN AMERICAN: 55 mL/min — AB (ref >90)
GFR, EST NON AFRICAN AMERICAN: 47 mL/min — AB (ref >90)
GLUCOSE: 97 mg/dL (ref 70–99)
Potassium: 4.4 mEq/L (ref 3.7–5.3)
Sodium: 142 mEq/L (ref 137–147)
TOTAL PROTEIN: 6.9 g/dL (ref 6.0–8.3)
Total Bilirubin: 0.7 mg/dL (ref 0.3–1.2)

## 2014-07-29 LAB — PRO B NATRIURETIC PEPTIDE: PRO B NATRI PEPTIDE: 566.9 pg/mL — AB (ref 0–125)

## 2014-07-29 LAB — CREATININE, SERUM
CREATININE: 1.32 mg/dL (ref 0.50–1.35)
GFR calc Af Amer: 65 mL/min — ABNORMAL LOW (ref >90)
GFR calc non Af Amer: 56 mL/min — ABNORMAL LOW (ref >90)

## 2014-07-29 LAB — TROPONIN I
TROPONIN I: 0.45 ng/mL — AB (ref <0.30)
TROPONIN I: 5.92 ng/mL — AB (ref <0.30)

## 2014-07-29 LAB — MRSA PCR SCREENING: MRSA by PCR: NEGATIVE

## 2014-07-29 MED ORDER — LIDOCAINE HCL (CARDIAC) 20 MG/ML IV SOLN
INTRAVENOUS | Status: AC
  Filled 2014-07-29: qty 5

## 2014-07-29 MED ORDER — NITROGLYCERIN 0.4 MG SL SUBL: 0.4000 mg | SUBLINGUAL_TABLET | SUBLINGUAL | Status: DC | PRN

## 2014-07-29 MED ORDER — ETOMIDATE 2 MG/ML IV SOLN
INTRAVENOUS | Status: AC
  Administered 2014-07-29: 14 mg via INTRAVENOUS
  Filled 2014-07-29: qty 20

## 2014-07-29 MED ORDER — SODIUM CHLORIDE 0.9 % IJ SOLN: 3.0000 mL | INTRAMUSCULAR | Status: DC | PRN

## 2014-07-29 MED ORDER — SODIUM CHLORIDE 0.9 % IV SOLN: INTRAVENOUS | Status: DC

## 2014-07-29 MED ORDER — RAMIPRIL 10 MG PO CAPS
10.0000 mg | ORAL_CAPSULE | Freq: Every day | ORAL | Status: DC
  Administered 2014-07-30 – 2014-07-31 (×2): 10 mg via ORAL
  Filled 2014-07-29 (×3): qty 1

## 2014-07-29 MED ORDER — SPIRONOLACTONE 25 MG PO TABS
25.0000 mg | ORAL_TABLET | Freq: Every day | ORAL | Status: DC
  Administered 2014-07-30 – 2014-07-31 (×2): 25 mg via ORAL
  Filled 2014-07-29 (×3): qty 1

## 2014-07-29 MED ORDER — METOPROLOL SUCCINATE ER 100 MG PO TB24
100.0000 mg | ORAL_TABLET | Freq: Every day | ORAL | Status: DC
  Administered 2014-07-30: 50 mg via ORAL
  Administered 2014-07-31: 100 mg via ORAL
  Filled 2014-07-29 (×3): qty 1

## 2014-07-29 MED ORDER — SODIUM CHLORIDE 0.9 % IV SOLN: 250.0000 mL | INTRAVENOUS | Status: DC | PRN

## 2014-07-29 MED ORDER — ONDANSETRON HCL 4 MG/2ML IJ SOLN: 4.0000 mg | Freq: Four times a day (QID) | INTRAMUSCULAR | Status: DC | PRN

## 2014-07-29 MED ORDER — SUCCINYLCHOLINE CHLORIDE 20 MG/ML IJ SOLN
INTRAMUSCULAR | Status: AC
  Filled 2014-07-29: qty 1

## 2014-07-29 MED ORDER — AMIODARONE IV BOLUS ONLY 150 MG/100ML: 150.0000 mg | Freq: Once | INTRAVENOUS | Status: AC

## 2014-07-29 MED ORDER — HEPARIN (PORCINE) IN NACL 100-0.45 UNIT/ML-% IJ SOLN
1250.0000 [IU]/h | INTRAMUSCULAR | Status: DC
  Administered 2014-07-29: 1250 [IU]/h via INTRAVENOUS
  Filled 2014-07-29 (×2): qty 250

## 2014-07-29 MED ORDER — ASPIRIN 81 MG PO CHEW
81.0000 mg | CHEWABLE_TABLET | ORAL | Status: AC
  Administered 2014-07-30: 81 mg via ORAL
  Filled 2014-07-29: qty 1

## 2014-07-29 MED ORDER — FOLIC ACID 0.5 MG HALF TAB
0.5000 mg | ORAL_TABLET | Freq: Every day | ORAL | Status: DC
  Administered 2014-07-30: 0.5 mg via ORAL
  Filled 2014-07-29 (×3): qty 1

## 2014-07-29 MED ORDER — ACETAMINOPHEN 325 MG PO TABS: 650.0000 mg | ORAL_TABLET | ORAL | Status: DC | PRN

## 2014-07-29 MED ORDER — AMIODARONE IV BOLUS ONLY 150 MG/100ML: 150.0000 mg | Freq: Once | INTRAVENOUS | Status: DC

## 2014-07-29 MED ORDER — ASPIRIN EC 81 MG PO TBEC: 81.0000 mg | DELAYED_RELEASE_TABLET | Freq: Every day | ORAL | Status: DC

## 2014-07-29 MED ORDER — HEPARIN BOLUS VIA INFUSION
4000.0000 [IU] | Freq: Once | INTRAVENOUS | Status: AC
  Administered 2014-07-29: 4000 [IU] via INTRAVENOUS
  Filled 2014-07-29: qty 4000

## 2014-07-29 MED ORDER — CILIDINIUM-CHLORDIAZEPOXIDE 2.5-5 MG PO CAPS
1.0000 | ORAL_CAPSULE | Freq: Three times a day (TID) | ORAL | Status: DC
  Filled 2014-07-29 (×10): qty 1

## 2014-07-29 MED ORDER — CLOPIDOGREL BISULFATE 75 MG PO TABS
75.0000 mg | ORAL_TABLET | Freq: Every day | ORAL | Status: DC
  Administered 2014-07-30 – 2014-07-31 (×2): 75 mg via ORAL
  Filled 2014-07-29 (×4): qty 1

## 2014-07-29 MED ORDER — ROSUVASTATIN CALCIUM 40 MG PO TABS
40.0000 mg | ORAL_TABLET | Freq: Every day | ORAL | Status: DC
  Administered 2014-07-29 – 2014-07-31 (×3): 40 mg via ORAL
  Filled 2014-07-29 (×5): qty 1

## 2014-07-29 MED ORDER — ASPIRIN EC 81 MG PO TBEC
81.0000 mg | DELAYED_RELEASE_TABLET | Freq: Every day | ORAL | Status: DC
  Administered 2014-07-31: 81 mg via ORAL
  Filled 2014-07-29 (×3): qty 1

## 2014-07-29 MED ORDER — ENOXAPARIN SODIUM 40 MG/0.4ML ~~LOC~~ SOLN
40.0000 mg | SUBCUTANEOUS | Status: DC
  Filled 2014-07-29: qty 0.4

## 2014-07-29 MED ORDER — SODIUM CHLORIDE 0.9 % IJ SOLN: 3.0000 mL | Freq: Two times a day (BID) | INTRAMUSCULAR | Status: DC

## 2014-07-29 MED ORDER — AMIODARONE HCL IN DEXTROSE 360-4.14 MG/200ML-% IV SOLN
60.0000 mg/h | INTRAVENOUS | Status: AC
  Administered 2014-07-29: 60 mg/h via INTRAVENOUS
  Filled 2014-07-29 (×2): qty 200

## 2014-07-29 MED ORDER — PANTOPRAZOLE SODIUM 40 MG PO TBEC
40.0000 mg | DELAYED_RELEASE_TABLET | Freq: Every day | ORAL | Status: DC
  Administered 2014-07-30 – 2014-07-31 (×2): 40 mg via ORAL
  Filled 2014-07-29 (×3): qty 1

## 2014-07-29 MED ORDER — ADULT MULTIVITAMIN W/MINERALS CH
1.0000 | ORAL_TABLET | Freq: Every day | ORAL | Status: DC
  Administered 2014-07-30 – 2014-07-31 (×2): 1 via ORAL
  Filled 2014-07-29 (×3): qty 1

## 2014-07-29 MED ORDER — AMIODARONE HCL 150 MG/3ML IV SOLN
INTRAVENOUS | Status: AC
  Administered 2014-07-29: 150 mg via INTRAVENOUS
  Filled 2014-07-29: qty 3

## 2014-07-29 MED ORDER — FOLIC ACID 800 MCG PO TABS: 400.0000 ug | ORAL_TABLET | Freq: Every day | ORAL | Status: DC

## 2014-07-29 MED ORDER — ROCURONIUM BROMIDE 50 MG/5ML IV SOLN
INTRAVENOUS | Status: AC
  Filled 2014-07-29: qty 2

## 2014-07-29 MED ORDER — SODIUM CHLORIDE 0.9 % IJ SOLN
3.0000 mL | Freq: Two times a day (BID) | INTRAMUSCULAR | Status: DC
  Administered 2014-07-31: 3 mL via INTRAVENOUS

## 2014-07-29 MED ORDER — AMIODARONE HCL IN DEXTROSE 360-4.14 MG/200ML-% IV SOLN
30.0000 mg/h | INTRAVENOUS | Status: DC
  Administered 2014-07-29 – 2014-07-31 (×4): 30 mg/h via INTRAVENOUS
  Filled 2014-07-29 (×8): qty 200

## 2014-07-29 NOTE — Progress Notes (Signed)
eLink Physician-Brief Progress Note Patient Name: Paul [name] DOB: [birth year] MRN: [id number]   Date of Service  07/29/2014  HPI/Events of Note  Cardiac patient from [facility], VT in [facility], amio and cardioversion, in ICU for observation.   eICU Interventions          Corra Kaine 07/29/2014, 7:04 PM

## 2014-07-29 NOTE — ED Notes (Signed)
Pt now alert and oriented x 4, denies cp/sob/dizziness.  C/o mild nausea.  [facility] in route.  NSR on monitor.

## 2014-07-29 NOTE — Progress Notes (Signed)
CRITICAL VALUE ALERT  Critical value received: Trop 5.92  Date of notification:  07/29/14  Time of notification:  2135  Critical value read back:Yes.    Nurse who received alert:  Melene Plan RN  MD notified (1st page):  Dr Tommi Rumps (covering for Dr Wynonia Lawman)  Time of first page:  2140  MD notified (2nd page):  Time of second page:  Responding MD: Dr Tommi Rumps  Time MD responded:  2150

## 2014-07-29 NOTE — ED Notes (Signed)
Pt states he started having pain and SOB about ab hour ago.

## 2014-07-29 NOTE — ED Notes (Signed)
Pt has taken 10 81mg  of baby ASA at home

## 2014-07-29 NOTE — H&P (Signed)
History and Physical   Admit date: 07/29/2014 Name:  Paul [name] Medical record number: [id number] DOB/Age:  01/14/1951  63 y.o. male  Primary Cardiologist: Wynonia Lawman  Primary Physician:  Melford Aase  Chief complaint/reason for admission: Chest pain-recent cardioversion for ventricular tachycardia  HPI:  Patient well-known to me. Has history of coronary artery disease previously previous inferior infarction and ischemic cardiomyopathy. He has had bypass grafting in September of 1999 and most recently catheterization done in March of 2013 showed a patent mammary graft to LAD, a patent vein graft to the right coronary artery with a questionable organized thrombus and a distally on, an occluded diagonal and marginal saphenous vein graft. He was not felt to be a good candidate for percutaneous intervention. He has been on disability from work because of fatigue as well as chronic angina which is not felt to be suitable for revascularization.   He had been doing well with stable angina but had been understood stress for he was taking care of a flooded basement. He was in his usual state of health today when he was walking up stairs and developed severe sharp chest pain that was different than his previous angina. He took nitroglycerin without relief and decided he wanted to come to [facility] had his wife start driving him when he was refusing to go by EMS. On the way he became somewhat dizzy and presented to [facility] where he was found to be in ventricular tachycardia at a rate of 188. He underwent cardioversion and was put to sleep at the time and when he awoke he was having no chest pain. Initial EKG showed ST depression anteriorly but subsequent EKG shows resolution of the ST depression post cardioversion. Troponin was minimally elevated at 5:00. He currently feels fine but is scared.    Past Medical History  Diagnosis Date  . CAD (coronary artery disease)     widely patent LAD LIMA graft,  widely patent PDA PLR SVG, occluded Diag 1 OM 1 SVG by cardiac CT 2007   . Ischemic cardiomyopathy     Prior inferior infarct EF 35% by ECHO 07/13/11    . Depression   . S/P CABG (coronary artery bypass graft)     CABG 06/1998 [place], [place] with LIMA to LAD, SVG to OM, SVG to PD-PL.   OM graft occluded 10/12/2005 by CTA   . Sleep apnea   . Hyperlipidemia   . GERD (gastroesophageal reflux disease)      Past Surgical History  Procedure Laterality Date  . Coronary artery bypass graft  1999  . Appendectomy     Allergies: is allergic to carvedilol; escitalopram oxalate; niacin and related; and spironolactone.   Medications: Prior to Admission medications   Medication Sig Start Date End Date Taking? Authorizing Provider  Ascorbic Acid (VITAMIN C) 1000 MG tablet Take 1,000 mg by mouth daily.   Yes Historical Provider, MD  aspirin EC 81 MG tablet Take 81 mg by mouth daily.   Yes Historical Provider, MD  Choline Fenofibrate (TRILIPIX) 135 MG capsule Take 135 mg by mouth daily.   Yes Historical Provider, MD  clidinium-chlordiazePOXIDE (LIBRAX) 5-2.5 MG per capsule Take 1 capsule by mouth 3 (three) times daily before meals.   Yes Historical Provider, MD  clopidogrel (PLAVIX) 75 MG tablet Take 75 mg by mouth daily.   Yes Historical Provider, MD  Coenzyme Q10 50 MG CAPS Take 1 capsule by mouth daily.   Yes Historical Provider, MD  digoxin (LANOXIN) 0.125  MG tablet Take 0.125 mg by mouth daily.   Yes Historical Provider, MD  eplerenone (INSPRA) 25 MG tablet Take 25 mg by mouth daily.   Yes Historical Provider, MD  folic acid (FOLVITE) 008 MCG tablet Take 400 mcg by mouth daily.   Yes Historical Provider, MD  metoprolol succinate (TOPROL-XL) 100 MG 24 hr tablet Take 100 mg by mouth daily. Take with or immediately following a meal.   Yes Historical Provider, MD  Multiple Vitamin (MULTIVITAMIN WITH MINERALS) TABS tablet Take 1 tablet by mouth daily.   Yes Historical Provider, MD  pantoprazole (PROTONIX)  40 MG tablet Take 40 mg by mouth daily.   Yes Historical Provider, MD  ramipril (ALTACE) 10 MG capsule Take 10 mg by mouth daily.   Yes Historical Provider, MD  rosuvastatin (CRESTOR) 40 MG tablet Take 40 mg by mouth daily.   Yes Historical Provider, MD  Vitamin E 400 UNITS TABS Take 800 Units by mouth daily.   Yes Historical Provider, MD   Family History:  Family Status  Relation Status Death Age  . Mother Alive   . Father Deceased   . Brother Deceased    Social History:   reports that he has never smoked. He has never used smokeless tobacco. He reports that he drinks about 1.2 ounces of alcohol per week. He reports that he does not use illicit drugs.   History   Social History Narrative  . No narrative on file    Review of Systems:   Other than as noted above, the remainder of the review of systems is normal  Physical Exam: BP 115/62  Pulse 61  Temp(Src) 98.5 F (36.9 C) (Oral)  Resp 17  Ht 5\' 8"  (1.727 m)  Wt 88.4 kg (194 lb 14.2 oz)  BMI 29.64 kg/m2  SpO2 98% General appearance: Pleasant, somewhat anxious white male in no acute distress Head: Normocephalic, without obvious abnormality, atraumatic Eyes: conjunctivae/corneas clear. PERRL, EOM's intact. Fundi not examined  Neck: no adenopathy, no carotid bruit, no JVD and supple, symmetrical, trachea midline Lungs: clear to auscultation bilaterally healed median sternotomy scar Heart: regular rate and rhythm, S1, S2 normal, no murmur, click, rub or gallop Abdomen: soft, non-tender; bowel sounds normal; no masses,  no organomegaly Rectal: deferred Extremities: extremities normal, atraumatic, no cyanosis or edema, prior scar saphenous vein graft harvesting Pulses: 2+ and symmetric Skin: Skin color, texture, turgor normal. No rashes or lesions Neurologic: Grossly normal   Labs: CBC  Recent Labs  07/29/14 1703  WBC 5.9  RBC 4.68  HGB 15.2  HCT 44.2  PLT 172  MCV 94.4  MCH 32.5  MCHC 34.4  RDW 13.6   LYMPHSABS 1.3  MONOABS 0.7  EOSABS 0.1  BASOSABS 0.0   CMP   Recent Labs  07/29/14 1703  NA 142  K 4.4  CL 106  CO2 24  GLUCOSE 97  BUN 14  CREATININE 1.51*  CALCIUM 9.0  PROT 6.9  ALBUMIN 3.7  AST 48*  ALT 40  ALKPHOS 53  BILITOT 0.7  GFRNONAA 47*  GFRAA 55*   BNP (last 3 results)  Recent Labs  07/29/14 1703  PROBNP 566.9*   Cardiac Panel (last 3 results)  Recent Labs  07/29/14 1703  TROPONINI 0.45*   Thyroid  Lab Results  Component Value Date   TSH 0.899 06/09/2010    EKG: Previous inferior infarction, sinus bradycardia, resolution of prior ST depression  Radiology: Cardiomegaly   IMPRESSIONS: IMPRESSIONS:   1. Sustained ventricular  tachycardia with recent cardioversion now stable on amiodarone  2. Coronary artery disease with previous inferior infarction and previous bypass grafting with ejection fraction slightly above 35% previously.  3. History of depression and fatigue  4. Sleep apnea  5. Hyperlipidemia   Recommendations:  I don't think that he needs to have urgent cath tonight. I think the presentation is that of sustained ventricular tachycardia. He is currently stable on amiodarone and does not have ischemic changes on EKG or ST elevation. We will plan elective cardiac catheterization tomorrow and he will continue on amiodarone overnight. Obtain echocardiogram. I think you will need to have an EP consultation and unless something is found that would explain it at catheterization likely will need a defibrillator.  Cardiac catheterization was discussed with the patient fully including risks of myocardial infarction, death, stroke, bleeding, arrhythmia, dye allergy, renal insufficiency or bleeding. The patient understands and is willing to proceed.  Signed: Kerry Hough MD [facility] Cardiology  07/29/2014, 7:29 PM

## 2014-07-29 NOTE — Consult Note (Addendum)
CARDIOLOGY CONSULT NOTE  Patient ID: Paul [name] MRN: [id number] DOB/AGE: 1951/07/22 63 y.o.  Admit date: 07/29/2014 Primary Physician Chesley Noon, MD Primary cardiologist: Tollie Eth MD Reason for Consultation: ventricular tachycardia,  CAD  HPI: The patient is a 63 yr old male with a PMH significant for CAD and CABG and is normally followed by Dr. Tollie Eth (thus no notes in Epic). I was called by Dr. Milton Ferguson to evaluate patient as he presented with 1 hour of antecedent chest pain, shortness of breath, and dizziness. At time of ED arrival, noted to be in ventricular tachycardia, 188 bpm. He was given 150 mg IV amiodarone and was successfully electrically cardioverted to normal sinus rhythm with 150 J biphasic shock. Amiodarone infusion was subsequently initiated. CBC normal, CXR demonstrates cardiomegaly without CHF. All remaining labs are pending.  Cath report by Dr. 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 EF by LV gram 35%. Dr. Hillery Jacks note from this time mentions VT with concomitant hypokalemia and hypomagnesemia.  Patient is currently stable on amiodarone infusion and denies chest pain, and says "I am scared".   Post-cardioversion ECG demonstrates sinus rhythm with 1.5-2 mm precordial ST depressions (V2, V4-6).  Home meds: ASA 81 mg Ramipril 10 mg Metoprolol 100 mg Crestor 40 mg Trilipix 135 mg Plavix 75 mg Digoxin 0.125 mg PPI MVI Inspra    Allergies  Allergen Reactions  . Carvedilol     Fatigue   . Escitalopram Oxalate   . Niacin And Related     Flushing   . Spironolactone     gynecomastia    Current Facility-Administered Medications  Medication Dose Route Frequency Provider Last Rate Last Dose  . amiodarone (NEXTERONE PREMIX) 360 MG/200ML (1.8 mg/mL) IV infusion  60 mg/hr Intravenous  Continuous Herminio Commons, MD 33.3 mL/hr at 07/29/14 1706 60 mg/hr at 07/29/14 1706  . amiodarone (NEXTERONE PREMIX) 360 MG/200ML (1.8 mg/mL) IV infusion  30 mg/hr Intravenous Continuous Herminio Commons, MD      . lidocaine (cardiac) 100 mg/9ml (XYLOCAINE) 20 MG/ML injection 2%           . rocuronium (ZEMURON) 50 MG/5ML injection           . succinylcholine (ANECTINE) 20 MG/ML injection            No current outpatient prescriptions on file.    Past Medical History  Diagnosis Date  . CAD (coronary artery disease)     widely patent LAD LIMA graft, widely patent PDA PLR SVG, occluded Diag 1 OM 1 SVG by cardiac CT 2007   . Ischemic cardiomyopathy     Prior inferior infarct EF 35% by ECHO 07/13/11    . Depression   . S/P CABG (coronary artery bypass graft)     CABG 06/1998 [place], [place] with LIMA to LAD, SVG to OM, SVG to PD-PL.   OM graft occluded 10/12/2005 by CTA   . Sleep apnea   . Hyperlipidemia   . GERD (gastroesophageal reflux disease)     Past Surgical History  Procedure Laterality Date  . Coronary artery bypass graft    . Appendectomy      History   Social History  . Marital Status: Married    Spouse Name: N/A    Number of Children: N/A  . Years of Education: N/A  Occupational History  . Not on file.   Social History Main Topics  . Smoking status: Never Smoker   . Smokeless tobacco: Never Used  . Alcohol Use: 1.2 oz/week    2 Glasses of wine per week  . Drug Use: No  . Sexual Activity:    Other Topics Concern  . Not on file   Social History Narrative  . No narrative on file     No family history of premature CAD in 1st degree relatives.  Prior to Admission medications   Not on File     Review of systems complete and found to be negative unless listed above in HPI     Physical exam Blood pressure 109/73, pulse 63, resp. rate 16, height 5\' 8"  (1.727 m), weight 198 lb (89.812 kg), SpO2 96.00%. General: NAD Neck: No JVD, no thyromegaly or  thyroid nodule.  Lungs: Clear to auscultation bilaterally with normal respiratory effort. CV: Nondisplaced PMI. Regular rate and rhythm, normal S1/S2, no S3/S4, no murmur.  No peripheral edema.  No carotid bruit.  Normal pedal pulses.  Abdomen: Soft, nontender, no hepatosplenomegaly, no distention.  Skin: Intact without lesions or rashes.  Neurologic: Alert and oriented x 3.  Psych: Normal affect. Extremities: No clubbing or cyanosis.  HEENT: Normal.   ECG: Most recent ECG reviewed.  Labs:   Lab Results  Component Value Date   WBC 5.9 07/29/2014   HGB 15.2 07/29/2014   HCT 44.2 07/29/2014   MCV 94.4 07/29/2014   PLT 172 07/29/2014   No results found for this basename: NA, K, CL, CO2, BUN, CREATININE, CALCIUM, LABALBU, PROT, BILITOT, ALKPHOS, ALT, AST, GLUCOSE,  in the last 168 hours Lab Results  Component Value Date   CKTOTAL 74 06/10/2010   CKMB 0.9 06/10/2010   TROPONINI  Value: 0.01        NO INDICATION OF MYOCARDIAL INJURY. 06/10/2010    Lab Results  Component Value Date   CHOL  Value: 117        ATP III CLASSIFICATION:  <200     mg/dL   Desirable  200-239  mg/dL   Borderline High  >=240    mg/dL   High        06/10/2010   Lab Results  Component Value Date   HDL 27* 06/10/2010   Lab Results  Component Value Date   LDLCALC  Value: 50        Total Cholesterol/HDL:CHD Risk Coronary Heart Disease Risk Table                     Men   Women  1/2 Average Risk   3.4   3.3  Average Risk       5.0   4.4  2 X Average Risk   9.6   7.1  3 X Average Risk  23.4   11.0        Use the calculated Patient Ratio above and the CHD Risk Table to determine the patient's CHD Risk.        ATP III CLASSIFICATION (LDL):  <100     mg/dL   Optimal  100-129  mg/dL   Near or Above                    Optimal  130-159  mg/dL   Borderline  160-189  mg/dL   High  >190     mg/dL   Very High 06/10/2010   Lab Results  Component Value Date   TRIG 198* 06/10/2010   Lab Results  Component Value Date   CHOLHDL 4.3  06/10/2010   No results found for this basename: LDLDIRECT         Studies: Dg Chest Portable 1 View  07/29/2014   CLINICAL DATA:  Chest pain  EXAM: PORTABLE CHEST - 1 VIEW  COMPARISON:  01/13/2012  FINDINGS: Mild cardiomegaly. Low volumes. Lungs are grossly clear allowing for central vascular crowding. No pneumothorax.  IMPRESSION: Cardiomegaly without decompensation.  Low lung volumes.   Electronically Signed   By: Maryclare Bean M.D.   On: 07/29/2014 17:08   Cath (01-19-2012): ANGIOGRAPHIC DATA:  CORONARY ARTERIES: Arise and distribute normally. Right dominant. Mild-to-moderate coronary calcification is noted.  Left main coronary artery: Mild irregularity  Left anterior descending: Mild calcification and irregularity proximally. Severe 95% stenosis prior to a small moderate sized diagonal branch. The vessel is then occluded and fills by means of a patent mammary artery.  Circumflex coronary artery: First marginal branch is free of disease, there is then a severe 80-90% stenosis in the proximal circumflex after the origin of the first marginal branch which supplies mainly a large second marginal branch.  Right coronary artery: Severe 80% stenosis proximally. The distal vessels fill largely through patent bypass grafts but there are also collaterals of the distal vessel coming from the left coronary system through the septal perforators. The posterior descending and posterior role.  Internal mammary graft to LAD: Widely patent with a patent distal anastomotic site. The anterior descending appears to terminate at the apex.  Saphenous vein graft to the obtuse marginal is occluded  Saphenous vein graft to right coronary artery: This vessel has 2 limbs to it. One limb goes to the posterior lateral branch and is widely patent. 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 The insertion site is patent. There is  somewhat slow flow of this limb of the graft.   LEFT VENTRICULOGRAM: Performed in the 30 RAO projection. The aortic and mitral valves are normal. The left ventricle is dilated with severe hypokinesis of the inferior wall. The estimated ejection fraction is 35%.   IMPRESSIONS:  1. Significant native vessel coronary artery disease with occlusion of the distal LAD, severe proximal stenosis the right coronary artery, proximal circumflex coronary artery, and diagonal branch. 2. 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 Potential sites of ischemia exists in the diagonal branch, distal right coronary circulation and circumflex. 4. Abnormal left ventricular r function with severe inferior hypokinesis and ejection fraction of 35%.   ASSESSMENT AND PLAN:  1. Ventricular tachycardia: EF 35% by LV gram in 2013. Most recent echo reports likely in Dr. Thurman Coyer records. Unclear whether arrhythmia is scar-related vs ischemia-driven. Given 1 hour of antecedent chest pain and ECG findings, I would suspect he needs coronary angiography sooner than later. Currently on amiodarone infusion. 2. CAD/CABG: Presented with 1 hour of antecedent chest pain. Most recent cath as noted above. Taking ASA 81 mg, metoprolol 100 mg, ramipril 10 mg, Plavix 75 mg, digoxin 0.125 mg, and Crestor 40 mg. Will need coronary angiography.  3. Hyperlipidemia: On Crestor 40 mg and Trilipix 135 mg. 4. Essential HTN: On ramipril 10 mg.  Dispo:  I have spoken with Dr. Pernell Dupre at [facility] who will accept patient and agrees with this strategy. I have also communicated with Dr. Tollie Eth. I have discussed management strategy with both the patient and his wife and they are in agreement.  Critical care time: 60 minutes.  Signed: Kate Sable, M.D.,  F.A.C.C.  07/29/2014, 5:19 PM

## 2014-07-29 NOTE — Progress Notes (Signed)
ANTICOAGULATION CONSULT NOTE - Initial Consult  Pharmacy Consult for heparin Indication: chest pain/ACS  Allergies  Allergen Reactions  . Carvedilol     Fatigue   . Escitalopram Oxalate   . Niacin And Related     Flushing   . Spironolactone     gynecomastia    Patient Measurements: Height: 5\' 8"  (172.7 cm) Weight: 194 lb 14.2 oz (88.4 kg) IBW/kg (Calculated) : 68.4 Heparin Dosing Weight:  Vital Signs: Temp: 98.4 F (36.9 C) (10/06 2000) Temp Source: Oral (10/06 2000) BP: 113/70 mmHg (10/06 2100) Pulse Rate: 52 (10/06 2100)  Labs:  Recent Labs  07/29/14 1703 07/29/14 2036  HGB 15.2 14.5  HCT 44.2 42.4  PLT 172 161  CREATININE 1.51* 1.32  TROPONINI 0.45* 5.92*    Estimated Creatinine Clearance: 61.9 ml/min (by C-G formula based on Cr of 1.32).   Medical History: Past Medical History  Diagnosis Date  . CAD (coronary artery disease)     widely patent LAD LIMA graft, widely patent PDA PLR SVG, occluded Diag 1 OM 1 SVG by cardiac CT 2007   . Ischemic cardiomyopathy     Prior inferior infarct EF 35% by ECHO 07/13/11    . Depression   . S/P CABG (coronary artery bypass graft)     CABG 06/1998 [place], [place] with LIMA to LAD, SVG to OM, SVG to PD-PL.   OM graft occluded 10/12/2005 by CTA   . Sleep apnea   . Hyperlipidemia   . GERD (gastroesophageal reflux disease)     Medications:  Scheduled:  . [START ON 07/30/2014] aspirin  81 mg Oral Pre-Cath  . [START ON 07/30/2014] aspirin EC  81 mg Oral Daily  . [START ON 07/30/2014] clidinium-chlordiazePOXIDE  1 capsule Oral TID AC  . [START ON 07/30/2014] clopidogrel  75 mg Oral Daily  . [START ON 44/12/1538] folic acid  0.5 mg Oral Daily  . heparin  4,000 Units Intravenous Once  . lidocaine (cardiac) 100 mg/75ml      . [START ON 07/30/2014] metoprolol succinate  100 mg Oral Daily  . [START ON 07/30/2014] multivitamin with minerals  1 tablet Oral Daily  . [START ON 07/30/2014] pantoprazole  40 mg Oral Daily  . [START ON  07/30/2014] ramipril  10 mg Oral Daily  . rocuronium      . rosuvastatin  40 mg Oral q1800  . sodium chloride  3 mL Intravenous Q12H  . sodium chloride  3 mL Intravenous Q12H  . [START ON 07/30/2014] spironolactone  25 mg Oral Daily  . succinylcholine        Assessment: 63 yr old male with a history of CAD and CABG in 1999, started to experience chest pain while working with a flooded basement. As his wife was driving him to [facility], they had to stop at [facility] when he became dizzy. He underwent cardioversion for ventricular tachycardia. He was transferred to [facility] and is set up for a cardiac cath tomorrow.  Goal of Therapy:  Heparin level 0.3-0.7 units/ml Monitor platelets by anticoagulation protocol: Yes   Plan:  Heparin 4000 units bolus, then 1250 units/hr. Check heparin level and CBC with AM labs and daily while on heparin.  Paul [name] 07/29/2014,10:26 PM

## 2014-07-29 NOTE — ED Notes (Signed)
PT HR 190 ER MD verbal order and delivered 150J external defib.

## 2014-07-29 NOTE — Progress Notes (Signed)
Patient well-known to me. Has history of coronary artery disease previously previous inferior infarction and ischemic cardiomyopathy. He has had bypass grafting in September of 1999 and most recently catheterization done in March of 2013 showed a patent mammary graft to LAD, a patent vein graft to the right coronary artery with a questionable organized thrombus and a distally on, an occluded diagonal and marginal saphenous vein graft. He was not felt to be a good candidate for percutaneous intervention. He has been on disability from work because of fatigue as well as chronic angina which is not felt to be suitable for revascularization.  He had been doing well with stable angina but had been understood stress for he was taking care of a flooded basement. He was in his usual state of health today when he was walking up stairs and developed severe sharp chest pain that was different than his previous angina. He took nitroglycerin without relief and decided he wanted to come to [facility] had him Y. start driving him refusing to go by EMS. On the way he became somewhat dizzy and presented to [facility] where he was found to be in ventricular tachycardia at a rate of 188. He underwent cardioversion and was put to sleep at the time and when he awoke he was having no chest pain. Initial EKG showed ST depression anteriorly but subsequent EKG shows resolution of the ST depression post cardioversion. Troponin was minimally elevated at 5:00. He currently feels fine but is scared.   IMPRESSIONS:  1. Sustained ventricular tachycardia with recent cardioversion now stable on amiodarone 2. Coronary artery disease with previous inferior infarction and previous bypass grafting with ejection fraction slightly above 35% previously. 3. History of depression and fatigue 4. Sleep apnea 5. Hyperlipidemia  Recommendations:  I don't think that he needs to have urgent cath tonight. I think the presentation is that of  sustained ventricular tachycardia. He is currently stable on amiodarone and does not have ischemic changes on EKG or ST elevation. We will plan elective cardiac catheterization tomorrow and he will continue on amiodarone overnight. Obtain echocardiogram. I think you will need to have an EP consultation and unless something is found that would explain it at catheterization likely will need a defibrillator.  Cardiac catheterization was discussed with the patient fully including risks of myocardial infarction, death, stroke, bleeding, arrhythmia, dye allergy, renal insufficiency or bleeding.  The patient understands and is willing to proceed.

## 2014-07-29 NOTE — ED Provider Notes (Signed)
CSN: [id number]     Arrival date & time 07/29/14  1614 History   First MD Initiated Contact with Patient 07/29/14 1622     Chief Complaint  Patient presents with  . Chest Pain     (Consider location/radiation/quality/duration/timing/severity/associated sxs/prior Treatment) Patient is a 63 y.o. male presenting with chest pain. The history is provided by the patient (the pt complains of chest pain with sob.   starting about one hour ago).  Chest Pain Pain location:  L chest Pain quality: aching   Pain radiates to:  Does not radiate Pain radiates to the back: no   Pain severity:  Moderate Onset quality:  Sudden Timing:  Constant Progression:  Waxing and waning Chronicity:  Recurrent Context: not breathing   Relieved by:  Nothing Worsened by:  Nothing tried Associated symptoms: no abdominal pain, no back pain, no cough, no fatigue and no headache     Past Medical History  Diagnosis Date  . CAD (coronary artery disease)     widely patent LAD LIMA graft, widely patent PDA PLR SVG, occluded Diag 1 OM 1 SVG by cardiac CT 2007   . Ischemic cardiomyopathy     Prior inferior infarct EF 35% by ECHO 07/13/11    . Depression   . S/P CABG (coronary artery bypass graft)     CABG 06/1998 [place] with LIMA to LAD, SVG to OM, SVG to PD-PL.   OM graft occluded 10/12/2005 by CTA   . Sleep apnea   . Hyperlipidemia   . GERD (gastroesophageal reflux disease)    Past Surgical History  Procedure Laterality Date  . Coronary artery bypass graft    . Appendectomy     Family History  Problem Relation Age of Onset  . Cerebral aneurysm Father   . Hepatitis Brother    History  Substance Use Topics  . Smoking status: Never Smoker   . Smokeless tobacco: Never Used  . Alcohol Use: 1.2 oz/week    2 Glasses of wine per week    Review of Systems  Constitutional: Negative for appetite change and fatigue.  HENT: Negative for congestion, ear discharge and sinus pressure.   Eyes: Negative for  discharge.  Respiratory: Negative for cough.   Cardiovascular: Positive for chest pain.  Gastrointestinal: Negative for abdominal pain and diarrhea.  Genitourinary: Negative for frequency and hematuria.  Musculoskeletal: Negative for back pain.  Skin: Negative for rash.  Neurological: Negative for seizures and headaches.  Psychiatric/Behavioral: Negative for hallucinations.      Allergies  Carvedilol; Escitalopram oxalate; Niacin and related; and Spironolactone  Home Medications   Prior to Admission medications   Not on File   BP 101/67  Pulse 64  Resp 22  Ht 5\' 8"  (1.727 m)  Wt 198 lb (89.812 kg)  BMI 30.11 kg/m2  SpO2 100% Physical Exam  Constitutional: He is oriented to person, place, and time. He appears well-developed.  HENT:  Head: Normocephalic.  Eyes: Conjunctivae and EOM are normal. No scleral icterus.  Neck: Neck supple. No thyromegaly present.  Cardiovascular: Exam reveals no gallop and no friction rub.   No murmur heard. tachycardia  Pulmonary/Chest: No stridor. He has no wheezes. He has no rales. He exhibits no tenderness.  Abdominal: He exhibits no distension. There is no tenderness. There is no rebound.  Musculoskeletal: Normal range of motion. He exhibits no edema.  Lymphadenopathy:    He has no cervical adenopathy.  Neurological: He is oriented to person, place, and time. He exhibits  normal muscle tone. Coordination normal.  Skin: No rash noted. No erythema.  Psychiatric: He has a normal mood and affect. His behavior is normal.    ED Course  Procedures (including critical care time) Labs Review Labs Reviewed  CBC WITH DIFFERENTIAL  COMPREHENSIVE METABOLIC PANEL  TROPONIN I  PRO B NATRIURETIC PEPTIDE    Imaging Review No results found.   EKG Interpretation   Date/Time:  Tuesday July 29 2014 16:51:33 EDT Ventricular Rate:  62 PR Interval:  168 QRS Duration: 115 QT Interval:  408 QTC Calculation: 414 R Axis:   139 Text  Interpretation:  Sinus rhythm Nonspecific intraventricular conduction  delay Repol abnrm, severe global ischemia (LM/MVD) Baseline wander in  lead(s) V5 V6 Confirmed by Kindred Reidinger  MD, Vanita Cannell [phone]) on 07/29/2014 5:06:52  PM     First ekg shows v tach     CRITICAL CARE Performed by: Kaneshia Cater L Total critical care time: 35 Critical care time was exclusive of separately billable procedures and treating other patients. Critical care was necessary to treat or prevent imminent or life-threatening deterioration. Critical care was time spent personally by me on the following activities: development of treatment plan with patient and/or surrogate as well as nursing, discussions with consultants, evaluation of patient's response to treatment, examination of patient, obtaining history from patient or surrogate, ordering and performing treatments and interventions, ordering and review of laboratory studies, ordering and review of radiographic studies, pulse oximetry and re-evaluation of patient's condition. Pt was given amniodorone without any good results.   Cardiology was consulted and the pt was cardioverted at 150 joules and converted to nsr MDM   Final diagnoses:  None   Pt was seen by cardiology and the [facility]        Maudry Diego, MD 07/29/14 1712

## 2014-07-30 ENCOUNTER — Encounter (HOSPITAL_COMMUNITY)
Admission: EM | Disposition: A | Discharge status: Home / Self Care | Payer: Self-pay | Source: Home / Self Care | Attending: Cardiology

## 2014-07-30 DIAGNOSIS — I214 Non-ST elevation (NSTEMI) myocardial infarction: Secondary | ICD-10-CM

## 2014-07-30 HISTORY — PX: LEFT HEART CATHETERIZATION WITH CORONARY/GRAFT ANGIOGRAM: SHX5450

## 2014-07-30 LAB — LIPID PANEL
CHOL/HDL RATIO: 3.4 ratio
Cholesterol: 137 mg/dL (ref 0–200)
HDL: 40 mg/dL (ref >39)
LDL CALC: 80 mg/dL (ref 0–99)
Triglycerides: 87 mg/dL (ref <150)
VLDL: 17 mg/dL (ref 0–40)

## 2014-07-30 LAB — TROPONIN I
TROPONIN I: 3.37 ng/mL — AB (ref <0.30)
TROPONIN I: 3.87 ng/mL — AB (ref <0.30)

## 2014-07-30 LAB — BASIC METABOLIC PANEL
Anion gap: 10 (ref 5–15)
BUN: 12 mg/dL (ref 6–23)
CO2: 27 mEq/L (ref 19–32)
Calcium: 8.7 mg/dL (ref 8.4–10.5)
Chloride: 107 mEq/L (ref 96–112)
Creatinine, Ser: 1.11 mg/dL (ref 0.50–1.35)
GFR calc Af Amer: 80 mL/min — ABNORMAL LOW (ref >90)
GFR, EST NON AFRICAN AMERICAN: 69 mL/min — AB (ref >90)
Glucose, Bld: 103 mg/dL — ABNORMAL HIGH (ref 70–99)
Potassium: 4.1 mEq/L (ref 3.7–5.3)
Sodium: 144 mEq/L (ref 137–147)

## 2014-07-30 LAB — CBC
HEMATOCRIT: 41.5 % (ref 39.0–52.0)
Hemoglobin: 14.2 g/dL (ref 13.0–17.0)
MCH: 32.1 pg (ref 26.0–34.0)
MCHC: 34.2 g/dL (ref 30.0–36.0)
MCV: 93.7 fL (ref 78.0–100.0)
PLATELETS: 154 10*3/uL (ref 150–400)
RBC: 4.43 MIL/uL (ref 4.22–5.81)
RDW: 13.8 % (ref 11.5–15.5)
WBC: 6.5 10*3/uL (ref 4.0–10.5)

## 2014-07-30 LAB — HEPARIN LEVEL (UNFRACTIONATED): Heparin Unfractionated: 0.65 IU/mL (ref 0.30–0.70)

## 2014-07-30 LAB — PROTIME-INR
INR: 1.29 (ref 0.00–1.49)
PROTHROMBIN TIME: 16.1 s — AB (ref 11.6–15.2)

## 2014-07-30 LAB — POCT ACTIVATED CLOTTING TIME: Activated Clotting Time: 146 seconds

## 2014-07-30 LAB — DIGOXIN LEVEL: DIGOXIN LVL: 0.7 ng/mL — AB (ref 0.8–2.0)

## 2014-07-30 LAB — MAGNESIUM: Magnesium: 1.2 mg/dL — ABNORMAL LOW (ref 1.5–2.5)

## 2014-07-30 SURGERY — LEFT HEART CATHETERIZATION WITH CORONARY/GRAFT ANGIOGRAM
Anesthesia: LOCAL

## 2014-07-30 MED ORDER — SODIUM CHLORIDE 0.9 % IV SOLN: INTRAVENOUS | Status: DC

## 2014-07-30 MED ORDER — MAGNESIUM SULFATE 40 MG/ML IJ SOLN
2.0000 g | Freq: Once | INTRAMUSCULAR | Status: AC
  Administered 2014-07-30: 2 g via INTRAVENOUS
  Filled 2014-07-30: qty 50

## 2014-07-30 MED ORDER — CHLORHEXIDINE GLUCONATE 4 % EX LIQD
60.0000 mL | Freq: Once | CUTANEOUS | Status: AC
  Administered 2014-07-30: 4 via TOPICAL
  Filled 2014-07-30: qty 60

## 2014-07-30 MED ORDER — ENOXAPARIN SODIUM 40 MG/0.4ML ~~LOC~~ SOLN
40.0000 mg | SUBCUTANEOUS | Status: DC
  Filled 2014-07-30: qty 0.4

## 2014-07-30 MED ORDER — ONDANSETRON HCL 4 MG/2ML IJ SOLN
4.0000 mg | Freq: Four times a day (QID) | INTRAMUSCULAR | Status: DC | PRN
  Administered 2014-07-30: 4 mg via INTRAVENOUS
  Filled 2014-07-30: qty 2

## 2014-07-30 MED ORDER — ACETAMINOPHEN 325 MG PO TABS: 650.0000 mg | ORAL_TABLET | ORAL | Status: DC | PRN

## 2014-07-30 MED ORDER — METOPROLOL TARTRATE 50 MG PO TABS
50.0000 mg | ORAL_TABLET | Freq: Once | ORAL | Status: DC
  Filled 2014-07-30: qty 1

## 2014-07-30 MED ORDER — SODIUM CHLORIDE 0.9 % IV SOLN: 1.0000 mL/kg/h | INTRAVENOUS | Status: AC

## 2014-07-30 MED ORDER — CEFAZOLIN SODIUM-DEXTROSE 2-3 GM-% IV SOLR
2.0000 g | INTRAVENOUS | Status: DC
  Filled 2014-07-30 (×2): qty 50

## 2014-07-30 MED ORDER — NITROGLYCERIN 1 MG/10 ML FOR IR/CATH LAB
INTRA_ARTERIAL | Status: AC
  Filled 2014-07-30: qty 10

## 2014-07-30 MED ORDER — LIDOCAINE HCL (PF) 1 % IJ SOLN
INTRAMUSCULAR | Status: AC
  Filled 2014-07-30: qty 30

## 2014-07-30 MED ORDER — ALPRAZOLAM 0.25 MG PO TABS
0.2500 mg | ORAL_TABLET | Freq: Every evening | ORAL | Status: DC | PRN
  Administered 2014-07-30 – 2014-08-01 (×2): 0.25 mg via ORAL
  Filled 2014-07-30 (×2): qty 1

## 2014-07-30 MED ORDER — SODIUM CHLORIDE 0.9 % IR SOLN
80.0000 mg | Status: DC
  Filled 2014-07-30: qty 2

## 2014-07-30 MED ORDER — CHLORHEXIDINE GLUCONATE 4 % EX LIQD
60.0000 mL | Freq: Once | CUTANEOUS | Status: AC
  Administered 2014-07-31: 4 via TOPICAL
  Filled 2014-07-30: qty 60

## 2014-07-30 MED ORDER — CETYLPYRIDINIUM CHLORIDE 0.05 % MT LIQD
7.0000 mL | Freq: Two times a day (BID) | OROMUCOSAL | Status: DC
  Administered 2014-07-30 – 2014-07-31 (×2): 7 mL via OROMUCOSAL

## 2014-07-30 MED ORDER — HEPARIN (PORCINE) IN NACL 2-0.9 UNIT/ML-% IJ SOLN
INTRAMUSCULAR | Status: AC
  Filled 2014-07-30: qty 1500

## 2014-07-30 NOTE — Progress Notes (Signed)
Pt resting quietly hob flat, sheath in place, aspirated bright red blood from sheath with no resistance. sheath pulled at 1522, tip intact. Pressure held until 2585, no complication noted, no obvious hematoma, no bleeding, dressed with gauze and tegaderm. Pedal pulse present. Advised pt to remain flat and to keep lle immobile.  until instructed otherwise. Denies need to void, urinal on railing, no discomfort.

## 2014-07-30 NOTE — CV Procedure (Signed)
CARDIAC CATHETERIZATION REPORT   Paul [name]  DOB/Age:  10/29/1950  63 y.o. male  MRN: [id number]  Today's date: 07/30/2014   PROCEDURE:  Left heart catheterization with selective coronary angiography, bypass graft angiograms, and left ventriculogram.  INDICATIONS:  Ventricular tachycardia requiring cardioversion following bypass grafting.  The risks, benefits, and details of the procedure were explained to the patient.  The patient verbalized understanding and wanted to proceed.  Informed written consent was obtained.  PROCEDURE TECHNIQUE:  After Xylocaine anesthesia a 55F sheath was placed in the right femoral artery with a single anterior needle wall stick.   Left coronary angiography was done using a Judkins L4 guide catheter.  Right coronary angiography was done using a Judkins R4 guide catheter. Bypass angiograms were made with the right bypass catheter catheter. The LIMA was selected with the right coronary catheter.  Left ventriculography was done using a pigtail catheter. The patient tolerated the procedure well and was returned to the holding area for sheath removal.   CONTRAST:  85 cc.  COMPLICATIONS:  None.    HEMODYNAMICS:  Aortic post contrast 130/71. LV postcontrast 130/5-23 There was no gradient between the left ventricle and aorta.    ANGIOGRAPHIC DATA:    CORONARY ARTERIES:   Arise and distribute normally.  Right dominant. Mild coronary calcification is noted.  Left main coronary artery: Normal  Left anterior descending: Mild irregularity proximally. Severe stenosis in the mid LAD prior to a mild moderate-sized diagonal branch. Bidirectional flow is seen in the distal LAD after the severe stenosis from the mammary graft. Lateral filling through a septal perforator system is seen of the distal posterolateral branch of the right coronary artery.  Circumflex coronary artery: Mild irregularity proximally. First marginal branch is free  of disease. 50% stenosis prior to the continuation branch and second marginal branch.  Right coronary artery: 70-80% proximal stenosis. Appears to be old recanalized thrombus in the distal continuation branch. There is bidirectional flow into the posterolateral branch noted. There is a large acute marginal branch that arises after the moderately severe stenosis.  SVG to RCA: This is a two-part graft. The proximal anastomosis is normal. There is a separate graft that goes to what appears to be the posterior descending and has what appears to be old recanalized thrombus in its distal portion prior to filling the posterior descending artery. The other limb going to the posterolateral branch is widely patent.  SVG to OM:  Previously occluded  LIMA to LAD:  Widely patent  LEFT VENTRICULOGRAM:  Performed in the 30 RAO projection.  The aortic and mitral valves are normal. The left ventricle is dilated with a large area of inferior wall akinesis and an estimated ejection fraction of 30%.   IMPRESSIONS:  1. Severe native two-vessel coronary artery disease with occlusion of the mid LAD, severe stenosis prior to a moderate-sized diagonal branch and moderate disease prior to the circumflex marginal branch of the circumflex. Occluded right coronary artery distally with a severe proximal stenosis. 2.  Patent saphenous vein graft to place the right coronary artery and patent mammary graft to LAD, previously occluded vein graft to the marginal system 3. Potential sites of ischemia are the distal posterolateral branch of the right coronary artery and moderate size diagonal branch  RECOMMENDATION:  Await electrophysiology recommendations but likely will need defibrillator placement.  Kerry Hough MD [facility]

## 2014-07-30 NOTE — Progress Notes (Signed)
Utilization Review Completed.Paul [name] T10/04/2014  

## 2014-07-30 NOTE — Progress Notes (Addendum)
Nutrition Brief Note  Patient identified on the Malnutrition Screening Tool (MST) Report For weight loss and poor appetite.  Per pt he has cut back his portions slightly so that he could lose a few pounds. Pt has followed a low sodium diet since 1998 after his first heart attack. No questions about diet.   Wt Readings from Last 15 Encounters:  07/29/14 194 lb 14.2 oz (88.4 kg)  07/29/14 194 lb 14.2 oz (88.4 kg)  01/19/12 192 lb (87.091 kg)  01/19/12 192 lb (87.091 kg)    Body mass index is 29.64 kg/(m^2). Patient meets criteria for overweight based on current BMI.   Current diet order is NPO. Labs and medications reviewed.   No nutrition interventions warranted at this time. Will follow up for any nutrition issues.    [place], [place], [place] Pager [phone] After Hours Pager

## 2014-07-30 NOTE — Progress Notes (Signed)
Subjective:  Did not sleep well. No SOB or chest pain.  No VT overnight.  EKG with T wave inversions anterolaterally today.  Mild elevation of troponin.  Objective:  Vital Signs in the last 24 hours: BP 115/63  Pulse 49  Temp(Src) 98.1 F (36.7 C) (Oral)  Resp 16  Ht 5\' 8"  (1.727 m)  Wt 88.4 kg (194 lb 14.2 oz)  BMI 29.64 kg/m2  SpO2 95%  Physical Exam: Pleasant WM in NAD Lungs:  Clear Cardiac:  Regular rhythm, normal S1 and S2, no S3 Extremities:  No edema present  Intake/Output from previous day: 10/06 0701 - 10/07 0700 In: 753.5 [I.V.:753.5] Out: 250 [Urine:250]  Weight Filed Weights   07/29/14 1625 07/29/14 1845  Weight: 89.812 kg (198 lb) 88.4 kg (194 lb 14.2 oz)    Lab Results: Basic Metabolic Panel:  Recent Labs  07/29/14 1703 07/29/14 2036 07/30/14 0650  NA 142  --  144  K 4.4  --  4.1  CL 106  --  107  CO2 24  --  27  GLUCOSE 97  --  103*  BUN 14  --  12  CREATININE 1.51* 1.32 1.11   CBC:  Recent Labs  07/29/14 1703 07/29/14 2036 07/30/14 0650  WBC 5.9 6.2 6.5  NEUTROABS 3.8  --   --   HGB 15.2 14.5 14.2  HCT 44.2 42.4 41.5  MCV 94.4 95.3 93.7  PLT 172 161 154   Cardiac Enzymes:  Recent Labs  07/29/14 2036 07/30/14 0149 07/30/14 0650  TROPONINI 5.92* 3.37* 3.87*    Telemetry: Sinus tachycardia, no more VT  Assessment/Plan:  1. Ventricular tachycardia - no recurrence on amiodarone 2. NonSTEMI  Cannot tell if was primary or a secondary event to the VT  Pain was dfifferent 3. Acute renal insufficiency resolved 4. Ischemic cardiomyopathy  Rec:  O:n IV heparin.  Continue amiodarone.  Cath later this am. EP consult   W. Doristine Church  MD [facility]  07/30/2014, 8:37 AM

## 2014-07-30 NOTE — Progress Notes (Signed)
Transported by bed to 2 h, cath site intact, no bleeding, no hematoma. Bedside report given. bandage remains intact. No chest pain , no sob. Hob flat.

## 2014-07-30 NOTE — Progress Notes (Signed)
ANTICOAGULATION CONSULT NOTE - Follow Up Consult  Pharmacy Consult for heparin Indication: chest pain/ACS  Allergies  Allergen Reactions  . Carvedilol     Fatigue   . Escitalopram Oxalate   . Niacin And Related     Flushing   . Spironolactone     gynecomastia    Patient Measurements: Height: 5\' 8"  (172.7 cm) Weight: 194 lb 14.2 oz (88.4 kg) IBW/kg (Calculated) : 68.4 Heparin Dosing Weight:  Vital Signs: Temp: 98.1 F (36.7 C) (10/07 0700) Temp Source: Oral (10/07 0700) BP: 115/63 mmHg (10/07 0700) Pulse Rate: 49 (10/07 0700)  Labs:  Recent Labs  07/29/14 1703 07/29/14 2036 07/30/14 0149 07/30/14 0650  HGB 15.2 14.5  --  14.2  HCT 44.2 42.4  --  41.5  PLT 172 161  --  154  LABPROT  --   --   --  16.1*  INR  --   --   --  1.29  HEPARINUNFRC  --   --   --  0.65  CREATININE 1.51* 1.32  --  1.11  TROPONINI 0.45* 5.92* 3.37* 3.87*    Estimated Creatinine Clearance: 73.6 ml/min (by C-G formula based on Cr of 1.11).   Medical History: Past Medical History  Diagnosis Date  . CAD (coronary artery disease)     widely patent LAD LIMA graft, widely patent PDA PLR SVG, occluded Diag 1 OM 1 SVG by cardiac CT 2007   . Ischemic cardiomyopathy     Prior inferior infarct EF 35% by ECHO 07/13/11    . Depression   . S/P CABG (coronary artery bypass graft)     CABG 06/1998 [place], [place] with LIMA to LAD, SVG to OM, SVG to PD-PL.   OM graft occluded 10/12/2005 by CTA   . Sleep apnea   . Hyperlipidemia   . GERD (gastroesophageal reflux disease)     Medications:  Scheduled:  . antiseptic oral rinse  7 mL Mouth Rinse BID  . aspirin EC  81 mg Oral Daily  . clidinium-chlordiazePOXIDE  1 capsule Oral TID AC  . clopidogrel  75 mg Oral Daily  . folic acid  0.5 mg Oral Daily  . metoprolol succinate  100 mg Oral Daily  . multivitamin with minerals  1 tablet Oral Daily  . pantoprazole  40 mg Oral Daily  . ramipril  10 mg Oral Daily  . rosuvastatin  40 mg Oral q1800  . sodium  chloride  3 mL Intravenous Q12H  . spironolactone  25 mg Oral Daily    Assessment: 63 yr old male with a history of CAD and CABG in 1999, started to experience chest pain while working with a flooded basement. As his wife was driving him to [facility], they had to stop at [facility] when he became dizzy. He underwent cardioversion for ventricular tachycardia. He was transferred to [facility] and is set up for a cardiac cath 10/7.  Heparin drip 1250 uts/hr HL 0.65 at goal, CBC stable, no bleeding noted.    Goal of Therapy:  Heparin level 0.3-0.7 units/ml Monitor platelets by anticoagulation protocol: Yes   Plan:  Heparin  1250 units/hr. Check heparin level and CBC with AM labs and daily while on heparin. F/u after cath   Bonnita Nasuti Pharm.D. CPP, BCPS Clinical Pharmacist [phone] 07/30/2014 8:38 AM

## 2014-07-30 NOTE — Consult Note (Signed)
Reason for Consult: Ventricular tach Referring Physician:    KARANDEEP Paul [name] is an 63 y.o. male.  HPI:   The patient is a 63 yo male with a history of CAD, CABG-1999, LIMA to LAD, SVG to PDA/PLR, SVG to OM1(occluded by cardiac CT 2007).  He has an ischemic CM with EF of 35% by echo 07/13/11, OSA, HLD, GERD.  He was living in [place] from 2004-2007 and his cardiologist at the time had mentioned the possibility of a defibrillator.   He and his wife were working in the yard yesterday.  Not too long after that he became dizzy, with blurry vision, generalized weakness and severe, sharp, CP.  His wife drove him to [facility] where he was found to be in VT. Cardioversion performed.  He was started on amiodarone.  Pain free since.  Troponin started at 5.92 and is down but went back up slightly(3.37>>3.87).  SCr improved.  The patient currently denies nausea, vomiting, fever,  shortness of breath, orthopnea, PND, cough, congestion, abdominal pain, hematochezia, melena, lower extremity edema.   Past Medical History  Diagnosis Date  . CAD (coronary artery disease)     widely patent LAD LIMA graft, widely patent PDA PLR SVG, occluded Diag 1 OM 1 SVG by cardiac CT 2007   . Ischemic cardiomyopathy     Prior inferior infarct EF 35% by ECHO 07/13/11    . Depression   . S/P CABG (coronary artery bypass graft)     CABG 06/1998 [place], [place] with LIMA to LAD, SVG to OM, SVG to PD-PL.   OM graft occluded 10/12/2005 by CTA   . Sleep apnea   . Hyperlipidemia   . GERD (gastroesophageal reflux disease)     Past Surgical History  Procedure Laterality Date  . Coronary artery bypass graft  1999  . Appendectomy      Family History  Problem Relation Age of Onset  . Cerebral aneurysm Father   . Hepatitis Brother     Social History:  reports that he has never smoked. He has never used smokeless tobacco. He reports that he drinks about 1.2 ounces of alcohol per week. He reports that he does not use illicit  drugs.  Allergies:  Allergies  Allergen Reactions  . Carvedilol     Fatigue   . Escitalopram Oxalate   . Niacin And Related     Flushing   . Spironolactone     gynecomastia    Medications:  Scheduled Meds: . antiseptic oral rinse  7 mL Mouth Rinse BID  . aspirin EC  81 mg Oral Daily  . clidinium-chlordiazePOXIDE  1 capsule Oral TID AC  . clopidogrel  75 mg Oral Daily  . folic acid  0.5 mg Oral Daily  . metoprolol tartrate  50 mg Oral Once  . metoprolol succinate  100 mg Oral Daily  . multivitamin with minerals  1 tablet Oral Daily  . pantoprazole  40 mg Oral Daily  . ramipril  10 mg Oral Daily  . rosuvastatin  40 mg Oral q1800  . sodium chloride  3 mL Intravenous Q12H  . spironolactone  25 mg Oral Daily   Continuous Infusions: . sodium chloride 75 mL/hr at 07/30/14 0400  . amiodarone 30 mg/hr (07/30/14 0925)  . heparin 1,250 Units/hr (07/29/14 2329)   PRN Meds:.sodium chloride, sodium chloride, acetaminophen, nitroGLYCERIN, ondansetron (ZOFRAN) IV, sodium chloride   Results for orders placed during the hospital encounter of 07/29/14 (from the past 48 hour(s))  CBC WITH DIFFERENTIAL     Status: None   Collection Time    07/29/14  5:03 PM      Result Value Ref Range   WBC 5.9  4.0 - 10.5 K/uL   RBC 4.68  4.22 - 5.81 MIL/uL   Hemoglobin 15.2  13.0 - 17.0 g/dL   HCT 44.2  39.0 - 52.0 %   MCV 94.4  78.0 - 100.0 fL   MCH 32.5  26.0 - 34.0 pg   MCHC 34.4  30.0 - 36.0 g/dL   RDW 13.6  11.5 - 15.5 %   Platelets 172  150 - 400 K/uL   Neutrophils Relative % 64  43 - 77 %   Neutro Abs 3.8  1.7 - 7.7 K/uL   Lymphocytes Relative 23  12 - 46 %   Lymphs Abs 1.3  0.7 - 4.0 K/uL   Monocytes Relative 11  3 - 12 %   Monocytes Absolute 0.7  0.1 - 1.0 K/uL   Eosinophils Relative 2  0 - 5 %   Eosinophils Absolute 0.1  0.0 - 0.7 K/uL   Basophils Relative 0  0 - 1 %   Basophils Absolute 0.0  0.0 - 0.1 K/uL  COMPREHENSIVE METABOLIC PANEL     Status: Abnormal   Collection Time      07/29/14  5:03 PM      Result Value Ref Range   Sodium 142  137 - 147 mEq/L   Potassium 4.4  3.7 - 5.3 mEq/L   Chloride 106  96 - 112 mEq/L   CO2 24  19 - 32 mEq/L   Glucose, Bld 97  70 - 99 mg/dL   BUN 14  6 - 23 mg/dL   Creatinine, Ser 1.51 (*) 0.50 - 1.35 mg/dL   Calcium 9.0  8.4 - 10.5 mg/dL   Total Protein 6.9  6.0 - 8.3 g/dL   Albumin 3.7  3.5 - 5.2 g/dL   AST 48 (*) 0 - 37 U/L   ALT 40  0 - 53 U/L   Alkaline Phosphatase 53  39 - 117 U/L   Total Bilirubin 0.7  0.3 - 1.2 mg/dL   GFR calc non Af Amer 47 (*) >90 mL/min   GFR calc Af Amer 55 (*) >90 mL/min   Comment: (NOTE)     The eGFR has been calculated using the CKD EPI equation.     This calculation has not been validated in all clinical situations.     eGFR's persistently <90 mL/min signify possible Chronic Kidney     Disease.   Anion gap 12  5 - 15  TROPONIN I     Status: Abnormal   Collection Time    07/29/14  5:03 PM      Result Value Ref Range   Troponin I 0.45 (*) <0.30 ng/mL   Comment:            Due to the release kinetics of cTnI,     a negative result within the first hours     of the onset of symptoms does not rule out     myocardial infarction with certainty.     If myocardial infarction is still suspected,     repeat the test at appropriate intervals.     CRITICAL RESULT CALLED TO, READ BACK BY AND VERIFIED WITH:     ROBERSON,J AT 1857 ON 07/29/2014 BY ISLEY,B  PRO B NATRIURETIC PEPTIDE     Status: Abnormal  Collection Time    07/29/14  5:03 PM      Result Value Ref Range   Pro B Natriuretic peptide (BNP) 566.9 (*) 0 - 125 pg/mL  MRSA PCR SCREENING     Status: None   Collection Time    07/29/14  6:51 PM      Result Value Ref Range   MRSA by PCR NEGATIVE  NEGATIVE   Comment:            The GeneXpert MRSA Assay (FDA     approved for NASAL specimens     only), is one component of a     comprehensive MRSA colonization     surveillance program. It is not     intended to diagnose MRSA      infection nor to guide or     monitor treatment for     MRSA infections.  TROPONIN I     Status: Abnormal   Collection Time    07/29/14  8:36 PM      Result Value Ref Range   Troponin I 5.92 (*) <0.30 ng/mL   Comment:            Due to the release kinetics of cTnI,     a negative result within the first hours     of the onset of symptoms does not rule out     myocardial infarction with certainty.     If myocardial infarction is still suspected,     repeat the test at appropriate intervals.     CRITICAL RESULT CALLED TO, READ BACK BY AND VERIFIED WITH:     TDemaris Callander RN [phone] [id number] GREEN R  TSH     Status: None   Collection Time    07/29/14  8:36 PM      Result Value Ref Range   TSH 0.781  0.350 - 4.500 uIU/mL  CBC     Status: None   Collection Time    07/29/14  8:36 PM      Result Value Ref Range   WBC 6.2  4.0 - 10.5 K/uL   RBC 4.45  4.22 - 5.81 MIL/uL   Hemoglobin 14.5  13.0 - 17.0 g/dL   HCT 42.4  39.0 - 52.0 %   MCV 95.3  78.0 - 100.0 fL   MCH 32.6  26.0 - 34.0 pg   MCHC 34.2  30.0 - 36.0 g/dL   RDW 13.7  11.5 - 15.5 %   Platelets 161  150 - 400 K/uL  CREATININE, SERUM     Status: Abnormal   Collection Time    07/29/14  8:36 PM      Result Value Ref Range   Creatinine, Ser 1.32  0.50 - 1.35 mg/dL   GFR calc non Af Amer 56 (*) >90 mL/min   GFR calc Af Amer 65 (*) >90 mL/min   Comment: (NOTE)     The eGFR has been calculated using the CKD EPI equation.     This calculation has not been validated in all clinical situations.     eGFR's persistently <90 mL/min signify possible Chronic Kidney     Disease.  TROPONIN I     Status: Abnormal   Collection Time    07/30/14  1:49 AM      Result Value Ref Range   Troponin I 3.37 (*) <0.30 ng/mL   Comment:            Due to the release kinetics of  cTnI,     a negative result within the first hours     of the onset of symptoms does not rule out     myocardial infarction with certainty.     If myocardial infarction is  still suspected,     repeat the test at appropriate intervals.     CRITICAL VALUE NOTED.  VALUE IS CONSISTENT WITH PREVIOUSLY REPORTED AND CALLED VALUE.  TROPONIN I     Status: Abnormal   Collection Time    07/30/14  6:50 AM      Result Value Ref Range   Troponin I 3.87 (*) <0.30 ng/mL   Comment:            Due to the release kinetics of cTnI,     a negative result within the first hours     of the onset of symptoms does not rule out     myocardial infarction with certainty.     If myocardial infarction is still suspected,     repeat the test at appropriate intervals.     CRITICAL VALUE NOTED.  VALUE IS CONSISTENT WITH PREVIOUSLY REPORTED AND CALLED VALUE.  BASIC METABOLIC PANEL     Status: Abnormal   Collection Time    07/30/14  6:50 AM      Result Value Ref Range   Sodium 144  137 - 147 mEq/L   Potassium 4.1  3.7 - 5.3 mEq/L   Chloride 107  96 - 112 mEq/L   CO2 27  19 - 32 mEq/L   Glucose, Bld 103 (*) 70 - 99 mg/dL   BUN 12  6 - 23 mg/dL   Creatinine, Ser 1.11  0.50 - 1.35 mg/dL   Calcium 8.7  8.4 - 10.5 mg/dL   GFR calc non Af Amer 69 (*) >90 mL/min   GFR calc Af Amer 80 (*) >90 mL/min   Comment: (NOTE)     The eGFR has been calculated using the CKD EPI equation.     This calculation has not been validated in all clinical situations.     eGFR's persistently <90 mL/min signify possible Chronic Kidney     Disease.   Anion gap 10  5 - 15  CBC     Status: None   Collection Time    07/30/14  6:50 AM      Result Value Ref Range   WBC 6.5  4.0 - 10.5 K/uL   RBC 4.43  4.22 - 5.81 MIL/uL   Hemoglobin 14.2  13.0 - 17.0 g/dL   HCT 41.5  39.0 - 52.0 %   MCV 93.7  78.0 - 100.0 fL   MCH 32.1  26.0 - 34.0 pg   MCHC 34.2  30.0 - 36.0 g/dL   RDW 13.8  11.5 - 15.5 %   Platelets 154  150 - 400 K/uL  LIPID PANEL     Status: None   Collection Time    07/30/14  6:50 AM      Result Value Ref Range   Cholesterol 137  0 - 200 mg/dL   Triglycerides 87  <150 mg/dL   HDL 40  >39 mg/dL     Total CHOL/HDL Ratio 3.4     VLDL 17  0 - 40 mg/dL   LDL Cholesterol 80  0 - 99 mg/dL   Comment:            Total Cholesterol/HDL:CHD Risk     Coronary Heart Disease Risk Table  Men   Women      1/2 Average Risk   3.4   3.3      Average Risk       5.0   4.4      2 X Average Risk   9.6   7.1      3 X Average Risk  23.4   11.0                Use the calculated Patient Ratio     above and the CHD Risk Table     to determine the patient's CHD Risk.                ATP III CLASSIFICATION (LDL):      <100     mg/dL   Optimal      100-129  mg/dL   Near or Above                        Optimal      130-159  mg/dL   Borderline      160-189  mg/dL   High      >190     mg/dL   Very High  PROTIME-INR     Status: Abnormal   Collection Time    07/30/14  6:50 AM      Result Value Ref Range   Prothrombin Time 16.1 (*) 11.6 - 15.2 seconds   INR 1.29  0.00 - 1.49  HEPARIN LEVEL (UNFRACTIONATED)     Status: None   Collection Time    07/30/14  6:50 AM      Result Value Ref Range   Heparin Unfractionated 0.65  0.30 - 0.70 IU/mL   Comment:            IF HEPARIN RESULTS ARE BELOW     EXPECTED VALUES, AND PATIENT     DOSAGE HAS BEEN CONFIRMED,     SUGGEST FOLLOW UP TESTING     OF ANTITHROMBIN III LEVELS.  MAGNESIUM     Status: Abnormal   Collection Time    07/30/14  6:50 AM      Result Value Ref Range   Magnesium 1.2 (*) 1.5 - 2.5 mg/dL  DIGOXIN LEVEL     Status: Abnormal   Collection Time    07/30/14  6:50 AM      Result Value Ref Range   Digoxin Level 0.7 (*) 0.8 - 2.0 ng/mL    Dg Chest Portable 1 View  07/29/2014   CLINICAL DATA:  Chest pain  EXAM: PORTABLE CHEST - 1 VIEW  COMPARISON:  01/13/2012  FINDINGS: Mild cardiomegaly. Low volumes. Lungs are grossly clear allowing for central vascular crowding. No pneumothorax.  IMPRESSION: Cardiomegaly without decompensation.  Low lung volumes.   Electronically Signed   By: Maryclare Bean M.D.   On: 07/29/2014 17:08     Review of Systems  Constitutional: Positive for malaise/fatigue. Negative for fever and diaphoresis.  HENT: Negative for congestion and sore throat.   Eyes: Positive for blurred vision.  Respiratory: Negative for cough and shortness of breath.   Cardiovascular: Positive for chest pain. Negative for palpitations, orthopnea, leg swelling and PND.  Gastrointestinal: Negative for nausea, vomiting, abdominal pain, blood in stool and melena.  Genitourinary: Negative for hematuria.  Musculoskeletal: Negative for myalgias.  Neurological: Positive for dizziness and weakness.  All other systems reviewed and are negative.  Blood pressure 115/63, pulse 49, temperature 98.1 F (36.7 C), temperature  source Oral, resp. rate 16, height 5' 8" (1.727 m), weight 194 lb 14.2 oz (88.4 kg), SpO2 95.00%. Physical Exam  Nursing note and vitals reviewed. Constitutional: He is oriented to person, place, and time. He appears well-developed and well-nourished. No distress.  HENT:  Head: Normocephalic and atraumatic.  Eyes: EOM are normal. Pupils are equal, round, and reactive to light.  Neck: Normal range of motion. Neck supple.  Cardiovascular: Regular rhythm, S1 normal and S2 normal.  Bradycardia present.   No murmur heard. Pulses:      Radial pulses are 2+ on the right side, and 2+ on the left side.       Dorsalis pedis pulses are 1+ on the right side, and 1+ on the left side.  No carotid Bruit  Respiratory: Effort normal and breath sounds normal. He has no wheezes. He has no rales.  GI: Soft. Bowel sounds are normal. He exhibits no distension. There is no tenderness.  Musculoskeletal: He exhibits no edema.  Lymphadenopathy:    He has no cervical adenopathy.  Neurological: He is alert and oriented to person, place, and time. He exhibits normal muscle tone.  Skin: Skin is warm and dry.  Psychiatric: He has a normal mood and affect.    Assessment/Plan: Active Problems:   Ventricular tachycardia On  IV amiodarone with no further VT episodes.  Maintaining sinus bradycardia. Also on toprol 197m.  2D echo.  pending.  Was this an acute ischemic event or triggered by mild dehydration(working in the yard)and low magnesium?   Need for ICD?       NSTEMI  Troponin trending down and back up slightly.  Left heart cath today.     Ischemic Cardiomyopathy:  EF 35%-40 by echo in 05/2010 Appears euvolemic.  Was being hydrated for cath. monitor fluid status.  On ramipril 149mand spironolactone.     Acute renal insufficiency  Resolved with IV fluids:   Hypomagnesemia   Replace mag with 2g IV.      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

## 2014-07-30 NOTE — Interval H&P Note (Signed)
History and Physical Interval Note:  07/30/2014 2:08 PM    Patient seen and examined.  No interval change in history and exam since last note.  Stable for procedure.  Kerry Hough. MD [facility]  07/30/2014

## 2014-07-31 ENCOUNTER — Encounter (HOSPITAL_COMMUNITY)
Admission: EM | Disposition: A | Discharge status: Home / Self Care | Payer: Self-pay | Source: Home / Self Care | Attending: Cardiology

## 2014-07-31 DIAGNOSIS — Z95810 Presence of automatic (implantable) cardiac defibrillator: Secondary | ICD-10-CM | POA: Insufficient documentation

## 2014-07-31 DIAGNOSIS — I255 Ischemic cardiomyopathy: Secondary | ICD-10-CM

## 2014-07-31 HISTORY — PX: IMPLANTABLE CARDIOVERTER DEFIBRILLATOR IMPLANT: SHX5473

## 2014-07-31 LAB — CBC
HCT: 41.4 % (ref 39.0–52.0)
Hemoglobin: 14 g/dL (ref 13.0–17.0)
MCH: 32.6 pg (ref 26.0–34.0)
MCHC: 33.8 g/dL (ref 30.0–36.0)
MCV: 96.3 fL (ref 78.0–100.0)
PLATELETS: 147 10*3/uL — AB (ref 150–400)
RBC: 4.3 MIL/uL (ref 4.22–5.81)
RDW: 14 % (ref 11.5–15.5)
WBC: 5.6 10*3/uL (ref 4.0–10.5)

## 2014-07-31 LAB — BASIC METABOLIC PANEL
ANION GAP: 13 (ref 5–15)
BUN: 7 mg/dL (ref 6–23)
CALCIUM: 8.4 mg/dL (ref 8.4–10.5)
CHLORIDE: 108 meq/L (ref 96–112)
CO2: 21 mEq/L (ref 19–32)
Creatinine, Ser: 1.08 mg/dL (ref 0.50–1.35)
GFR calc non Af Amer: 71 mL/min — ABNORMAL LOW (ref >90)
GFR, EST AFRICAN AMERICAN: 82 mL/min — AB (ref >90)
Glucose, Bld: 93 mg/dL (ref 70–99)
Potassium: 4.2 mEq/L (ref 3.7–5.3)
Sodium: 142 mEq/L (ref 137–147)

## 2014-07-31 LAB — MAGNESIUM: MAGNESIUM: 2.4 mg/dL (ref 1.5–2.5)

## 2014-07-31 SURGERY — IMPLANTABLE CARDIOVERTER DEFIBRILLATOR IMPLANT
Anesthesia: LOCAL

## 2014-07-31 MED ORDER — CEFAZOLIN SODIUM 1-5 GM-% IV SOLN
1.0000 g | Freq: Four times a day (QID) | INTRAVENOUS | Status: AC
  Administered 2014-07-31 – 2014-08-01 (×3): 1 g via INTRAVENOUS
  Filled 2014-07-31 (×4): qty 50

## 2014-07-31 MED ORDER — SODIUM CHLORIDE 0.9 % IJ SOLN: 3.0000 mL | INTRAMUSCULAR | Status: DC | PRN

## 2014-07-31 MED ORDER — SODIUM CHLORIDE 0.9 % IJ SOLN
3.0000 mL | Freq: Two times a day (BID) | INTRAMUSCULAR | Status: DC
Start: 2014-07-31 — End: 2014-08-01
  Administered 2014-07-31: 3 mL via INTRAVENOUS

## 2014-07-31 MED ORDER — FENTANYL CITRATE 0.05 MG/ML IJ SOLN
INTRAMUSCULAR | Status: AC
  Filled 2014-07-31: qty 2

## 2014-07-31 MED ORDER — MIDAZOLAM HCL 5 MG/5ML IJ SOLN
INTRAMUSCULAR | Status: AC
  Filled 2014-07-31: qty 5

## 2014-07-31 MED ORDER — HEPARIN (PORCINE) IN NACL 2-0.9 UNIT/ML-% IJ SOLN
INTRAMUSCULAR | Status: AC
  Filled 2014-07-31: qty 500

## 2014-07-31 MED ORDER — HYDROCODONE-ACETAMINOPHEN 5-325 MG PO TABS
1.0000 | ORAL_TABLET | ORAL | Status: DC | PRN
  Administered 2014-07-31: 2 via ORAL
  Filled 2014-07-31: qty 2

## 2014-07-31 MED ORDER — ACETAMINOPHEN 325 MG PO TABS: 325.0000 mg | ORAL_TABLET | ORAL | Status: DC | PRN

## 2014-07-31 MED ORDER — LIDOCAINE HCL (PF) 1 % IJ SOLN
INTRAMUSCULAR | Status: AC
  Filled 2014-07-31: qty 30

## 2014-07-31 MED ORDER — CEFAZOLIN SODIUM-DEXTROSE 2-3 GM-% IV SOLR
INTRAVENOUS | Status: AC
  Filled 2014-07-31: qty 50

## 2014-07-31 MED ORDER — SODIUM CHLORIDE 0.9 % IV SOLN: 250.0000 mL | INTRAVENOUS | Status: DC | PRN

## 2014-07-31 MED ORDER — PERFLUTREN LIPID MICROSPHERE
1.0000 mL | INTRAVENOUS | Status: AC | PRN
  Filled 2014-07-31: qty 10

## 2014-07-31 MED ORDER — ONDANSETRON HCL 4 MG/2ML IJ SOLN: 4.0000 mg | Freq: Four times a day (QID) | INTRAMUSCULAR | Status: DC | PRN

## 2014-07-31 MED FILL — Medication: Qty: 1 | Status: AC

## 2014-07-31 NOTE — Progress Notes (Signed)
SUBJECTIVE: The patient is doing well today.  At this time, he denies chest pain, shortness of breath, or any new concerns.  Catheterization yesterday demonstrated severe native two-vessel coronary artery disease with occlusion of the mid LAD, severe stenosis prior to a moderate-sized diagonal branch and moderate disease prior to the circumflex marginal branch of the circumflex. Occluded right coronary artery distally with a severe proximal stenosis. Patent saphenous vein graft to place the right coronary artery and patent mammary graft to LAD, previously occluded vein graft to the marginal system Potential sites of ischemia are the distal posterolateral branch of the right coronary artery and moderate size diagonal branch.  No intervention planned.  EF 30%.  Echo 2011 demonstrated EF 35-40%; repeat echo pending this admission.  Home medications included beta blocker, ACE-I, digoxin, and eplerenone.    CURRENT MEDICATIONS: . antiseptic oral rinse  7 mL Mouth Rinse BID  . aspirin EC  81 mg Oral Daily  .  ceFAZolin (ANCEF) IV  2 g Intravenous On Call to OR  . chlorhexidine  60 mL Topical Once  . clidinium-chlordiazePOXIDE  1 capsule Oral TID AC  . clopidogrel  75 mg Oral Daily  . folic acid  0.5 mg Oral Daily  . gentamicin irrigation  80 mg Irrigation On Call  . metoprolol tartrate  50 mg Oral Once  . metoprolol succinate  100 mg Oral Daily  . multivitamin with minerals  1 tablet Oral Daily  . pantoprazole  40 mg Oral Daily  . ramipril  10 mg Oral Daily  . rosuvastatin  40 mg Oral q1800  . sodium chloride  3 mL Intravenous Q12H  . spironolactone  25 mg Oral Daily   . sodium chloride    . sodium chloride 50 mL/hr at 07/31/14 0600  . amiodarone 30 mg/hr (07/30/14 2205)    OBJECTIVE: Physical Exam: Filed Vitals:   07/31/14 0500 07/31/14 0600 07/31/14 0700 07/31/14 0726  BP: 98/54 101/58 122/69   Pulse:    48  Temp:    98.2 F (36.8 C)  TempSrc:    Oral  Resp: 12 13 17      Height:      Weight:      SpO2:    91%    Intake/Output Summary (Last 24 hours) at 07/31/14 0911 Last data filed at 07/31/14 0600  Gross per 24 hour  Intake 1567.21 ml  Output   1350 ml  Net 217.21 ml    Telemetry reveals sinus bradycardia  GEN- The patient is well appearing, alert and oriented x 3 today.   Head- normocephalic, atraumatic Eyes-  Sclera clear, conjunctiva pink Ears- hearing intact Oropharynx- clear Neck- supple,   Lungs- Clear to ausculation bilaterally, normal work of breathing Heart- Regular rate and rhythm, no murmurs, rubs or gallops, PMI not laterally displaced GI- soft, NT, ND, + BS Extremities- no clubbing, cyanosis, or edema Skin- no rash or lesion Psych- euthymic mood, full affect Neuro- strength and sensation are intact  LABS: Basic Metabolic Panel:  Recent Labs  07/30/14 0650 07/31/14 0355  NA 144 142  K 4.1 4.2  CL 107 108  CO2 27 21  GLUCOSE 103* 93  BUN 12 7  CREATININE 1.11 1.08  CALCIUM 8.7 8.4  MG 1.2* 2.4   Liver Function Tests:  Recent Labs  07/29/14 1703  AST 48*  ALT 40  ALKPHOS 53  BILITOT 0.7  PROT 6.9  ALBUMIN 3.7   CBC:  Recent Labs  07/29/14 1703  07/30/14 0650 07/31/14 0355  WBC 5.9  < > 6.5 5.6  NEUTROABS 3.8  --   --   --   HGB 15.2  < > 14.2 14.0  HCT 44.2  < > 41.5 41.4  MCV 94.4  < > 93.7 96.3  PLT 172  < > 154 147*  < > = values in this interval not displayed. Cardiac Enzymes:  Recent Labs  07/29/14 2036 07/30/14 0149 07/30/14 0650  TROPONINI 5.92* 3.37* 3.87*   Fasting Lipid Panel:  Recent Labs  07/30/14 0650  CHOL 137  HDL 40  LDLCALC 80  TRIG 87  CHOLHDL 3.4   Thyroid Function Tests:  Recent Labs  07/29/14 2036  TSH 0.781   RADIOLOGY: Dg Chest Portable 1 View 07/29/2014   CLINICAL DATA:  Chest pain  EXAM: PORTABLE CHEST - 1 VIEW  COMPARISON:  01/13/2012  FINDINGS: Mild cardiomegaly. Low volumes. Lungs are grossly clear allowing for central vascular crowding. No  pneumothorax.  IMPRESSION: Cardiomegaly without decompensation.  Low lung volumes.   Electronically Signed   By: Maryclare Bean M.D.   On: 07/29/2014 17:08    ASSESSMENT AND PLAN:  Active Problems:   Ventricular tachycardia  1. Symptomatic hemodynamically unstable VT VT appears to arise from the inferolateral LV, likely in an area of prior scar.  I feel sure that his troponin elevation is due to sustained hemodynamically significant VT and does not represent a primary MI event.  He has been treated with an appropriate medical regimen previously for his ischemic CM.   The patient has an ischemic CM (EF 30%), NYHA Class II CHF, CAD and sustained VT. At this time, he meets criteria for ICD implantation for secondary prevention of VT.  He has a narrow QRS and therefore does not meet criteria for CRT.  He has sinus bradycardia and will likely require additional medicine for VT treatment in the future.  I will therefore plan to place an atrial lead.  Risks, benefits, alternatives to ICD implantation were discussed in detail with the patient today. The patient  understands that the risks include but are not limited to bleeding, infection, pneumothorax, perforation, tamponade, vascular damage, renal failure, MI, stroke, death, inappropriate shocks, and lead dislodgement and wishes to proceed.  We will schedule this procedure later today as our schedule allows. No driving x 6 months- pt is aware.

## 2014-07-31 NOTE — Progress Notes (Addendum)
  Echocardiogram 2D Echocardiogram with Definity has been performed.  Anique Beckley 07/31/2014, 9:16 AM

## 2014-07-31 NOTE — Interval H&P Note (Signed)
History and Physical Interval Note:  ICD Criteria  Current LVEF:30% ;Obtained < 1 month ago.  NYHA Functional Classification: Class II  Heart Failure History:  Yes, Duration of heart failure since onset is > 9 months  Non-Ischemic Dilated Cardiomyopathy History:  No.  Atrial Fibrillation/Atrial Flutter:  No.  Ventricular Tachycardia History:  Yes, Hemodynamic instability present, VT Type:  SVT - Monomorphic.  Cardiac Arrest History:  No  History of Syndromes with Risk of Sudden Death:  No.  Previous ICD:  No.  Electrophysiology Study: No.  Prior MI: Yes, Most recent MI timeframe is > 40 days.  PPM: No.  OSA:  No  Patient Life Expectancy of >=1 year: Yes.  Anticoagulation Therapy:  Patient is NOT on anticoagulation therapy.   Beta Blocker Therapy:  Yes.   Ace Inhibitor/ARB Therapy:  Yes.     07/31/2014 11:41 AM  Paul [name]  has presented today for surgery, with the diagnosis of v-tach  The various methods of treatment have been discussed with the patient and family. After consideration of risks, benefits and other options for treatment, the patient has consented to  Procedure(s): IMPLANTABLE CARDIOVERTER DEFIBRILLATOR IMPLANT (N/A) as a surgical intervention .  The patient's history has been reviewed, patient examined, no change in status, stable for surgery.  I have reviewed the patient's chart and labs.  Questions were answered to the patient's satisfaction.     Thompson Grayer

## 2014-07-31 NOTE — H&P (View-Only) (Signed)
SUBJECTIVE: The patient is doing well today.  At this time, he denies chest pain, shortness of breath, or any new concerns.  Catheterization yesterday demonstrated severe native two-vessel coronary artery disease with occlusion of the mid LAD, severe stenosis prior to a moderate-sized diagonal branch and moderate disease prior to the circumflex marginal branch of the circumflex. Occluded right coronary artery distally with a severe proximal stenosis. Patent saphenous vein graft to place the right coronary artery and patent mammary graft to LAD, previously occluded vein graft to the marginal system Potential sites of ischemia are the distal posterolateral branch of the right coronary artery and moderate size diagonal branch.  No intervention planned.  EF 30%.  Echo 2011 demonstrated EF 35-40%; repeat echo pending this admission.  Home medications included beta blocker, ACE-I, digoxin, and eplerenone.    CURRENT MEDICATIONS: . antiseptic oral rinse  7 mL Mouth Rinse BID  . aspirin EC  81 mg Oral Daily  .  ceFAZolin (ANCEF) IV  2 g Intravenous On Call to OR  . chlorhexidine  60 mL Topical Once  . clidinium-chlordiazePOXIDE  1 capsule Oral TID AC  . clopidogrel  75 mg Oral Daily  . folic acid  0.5 mg Oral Daily  . gentamicin irrigation  80 mg Irrigation On Call  . metoprolol tartrate  50 mg Oral Once  . metoprolol succinate  100 mg Oral Daily  . multivitamin with minerals  1 tablet Oral Daily  . pantoprazole  40 mg Oral Daily  . ramipril  10 mg Oral Daily  . rosuvastatin  40 mg Oral q1800  . sodium chloride  3 mL Intravenous Q12H  . spironolactone  25 mg Oral Daily   . sodium chloride    . sodium chloride 50 mL/hr at 07/31/14 0600  . amiodarone 30 mg/hr (07/30/14 2205)    OBJECTIVE: Physical Exam: Filed Vitals:   07/31/14 0500 07/31/14 0600 07/31/14 0700 07/31/14 0726  BP: 98/54 101/58 122/69   Pulse:    48  Temp:    98.2 F (36.8 C)  TempSrc:    Oral  Resp: 12 13 17      Height:      Weight:      SpO2:    91%    Intake/Output Summary (Last 24 hours) at 07/31/14 0911 Last data filed at 07/31/14 0600  Gross per 24 hour  Intake 1567.21 ml  Output   1350 ml  Net 217.21 ml    Telemetry reveals sinus bradycardia  GEN- The patient is well appearing, alert and oriented x 3 today.   Head- normocephalic, atraumatic Eyes-  Sclera clear, conjunctiva pink Ears- hearing intact Oropharynx- clear Neck- supple,   Lungs- Clear to ausculation bilaterally, normal work of breathing Heart- Regular rate and rhythm, no murmurs, rubs or gallops, PMI not laterally displaced GI- soft, NT, ND, + BS Extremities- no clubbing, cyanosis, or edema Skin- no rash or lesion Psych- euthymic mood, full affect Neuro- strength and sensation are intact  LABS: Basic Metabolic Panel:  Recent Labs  07/30/14 0650 07/31/14 0355  NA 144 142  K 4.1 4.2  CL 107 108  CO2 27 21  GLUCOSE 103* 93  BUN 12 7  CREATININE 1.11 1.08  CALCIUM 8.7 8.4  MG 1.2* 2.4   Liver Function Tests:  Recent Labs  07/29/14 1703  AST 48*  ALT 40  ALKPHOS 53  BILITOT 0.7  PROT 6.9  ALBUMIN 3.7   CBC:  Recent Labs  07/29/14 1703  07/30/14 0650 07/31/14 0355  WBC 5.9  < > 6.5 5.6  NEUTROABS 3.8  --   --   --   HGB 15.2  < > 14.2 14.0  HCT 44.2  < > 41.5 41.4  MCV 94.4  < > 93.7 96.3  PLT 172  < > 154 147*  < > = values in this interval not displayed. Cardiac Enzymes:  Recent Labs  07/29/14 2036 07/30/14 0149 07/30/14 0650  TROPONINI 5.92* 3.37* 3.87*   Fasting Lipid Panel:  Recent Labs  07/30/14 0650  CHOL 137  HDL 40  LDLCALC 80  TRIG 87  CHOLHDL 3.4   Thyroid Function Tests:  Recent Labs  07/29/14 2036  TSH 0.781   RADIOLOGY: Dg Chest Portable 1 View 07/29/2014   CLINICAL DATA:  Chest pain  EXAM: PORTABLE CHEST - 1 VIEW  COMPARISON:  01/13/2012  FINDINGS: Mild cardiomegaly. Low volumes. Lungs are grossly clear allowing for central vascular crowding. No  pneumothorax.  IMPRESSION: Cardiomegaly without decompensation.  Low lung volumes.   Electronically Signed   By: Maryclare Bean M.D.   On: 07/29/2014 17:08    ASSESSMENT AND PLAN:  Active Problems:   Ventricular tachycardia  1. Symptomatic hemodynamically unstable VT VT appears to arise from the inferolateral LV, likely in an area of prior scar.  I feel sure that his troponin elevation is due to sustained hemodynamically significant VT and does not represent a primary MI event.  He has been treated with an appropriate medical regimen previously for his ischemic CM.   The patient has an ischemic CM (EF 30%), NYHA Class II CHF, CAD and sustained VT. At this time, he meets criteria for ICD implantation for secondary prevention of VT.  He has a narrow QRS and therefore does not meet criteria for CRT.  He has sinus bradycardia and will likely require additional medicine for VT treatment in the future.  I will therefore plan to place an atrial lead.  Risks, benefits, alternatives to ICD implantation were discussed in detail with the patient today. The patient  understands that the risks include but are not limited to bleeding, infection, pneumothorax, perforation, tamponade, vascular damage, renal failure, MI, stroke, death, inappropriate shocks, and lead dislodgement and wishes to proceed.  We will schedule this procedure later today as our schedule allows. No driving x 6 months- pt is aware.

## 2014-07-31 NOTE — Op Note (Signed)
SURGEON:  Thompson Grayer, MD      PREPROCEDURE DIAGNOSES:   1. Ischemic cardiomyopathy.   2. New York Heart Association class II, heart failure chronically.   3. Hemodynamically unstable ventricular tachycardia     POSTPROCEDURE DIAGNOSES:   1. Ischemic cardiomyopathy.   2. New York Heart Association class II, heart failure chronically.   3. Hemodynamically unstable ventricular tachycardia     PROCEDURES:    1. ICD implantation.  2. Defibrillation threshold testing     INTRODUCTION:  Paul [name] is a 63 y.o. male with an ischemic CM (EF 30%), NYHA Class II CHF, and CAD.  He is admitted with hemodynamically unstable VT at 188 bpm for which he required defibrillation.  At this time, he meets criteria for ICD implantation for secondary prevention of VT.  The patient has a narrow QRS and does not meet criteria for revascularization.  He has chronic sinus bradycardia and an atrial lead is therefore placed to allow for additional management of VT if needed. The patient has been treated with an optimal medical regimen chronically.  The patient therefore  presents today for ICD implantation.      DESCRIPTION OF PROCEDURE:  Informed written consent was obtained and the patient was brought to the electrophysiology lab in the fasting state. The patient was adequately sedated with intravenous Versed, and fentanyl as outlined in the nursing report.  The patient's left chest was prepped and draped in the usual sterile fashion by the EP lab staff.  The skin overlying the left deltopectoral region was infiltrated with lidocaine for local analgesia.  A 5-cm incision was made over the left deltopectoral region.  A left subcutaneous defibrillator pocket was fashioned using a combination of sharp and blunt dissection.  Electrocautery was used to assure hemostasis.    RA/RV Lead Placement: The left axillary vein was cannulated with fluoroscopic visualization.  No contrast was required for this endeavor.  Through  the left axillary vein, a Medtronic model P8572387  (serial # [id number]  ) right atrial lead and a Medtronic, model [id number] (serial number [id number]) right ventricular defibrillator lead were advanced with fluoroscopic visualization into the right atrial appendage and right ventricular apex positions respectively.  Initial atrial lead P-waves measured 1.7 mV with an impedance of 599 ohms and a threshold of 1.3 volts at 0.5 milliseconds.  The right ventricular lead R-wave measured 9.9 mV with impedance of 756 ohms and a threshold of 1.1 volts at 0.5 milliseconds.   The leads were secured to the pectoralis  fascia using #2 silk suture over the suture sleeves.  The pocket then  irrigated with copious gentamicin solution.  The leads were then  connected to a Medtronic Evera XT DR model DDBB1D1 (serial  Number [id number]) ICD.  The defibrillator was placed into the  pocket.  The pocket was then closed in 2 layers with 2.0 Vicryl suture  for the subcutaneous and subcuticular layers. EBL<26ml.  Steri-Strips and a  sterile dressing were then applied.   DFT Testing: Defibrillation Threshold testing was then performed. Ventricular fibrillation was induced with a T shock.  Adequate sensing of ventricular  fibrillation was observed with minimal dropout with a programmed sensitivity of 1.57mV.  The patient was successfully defibrillated to sinus rhythm with a single 15 joules shock delivered from the device with an impedance of 64 ohms in a duration of 4  seconds.  The patient remained in sinus rhythm thereafter.  There were no early apparent complications.  CONCLUSIONS:   1. Ischemic cardiomyopathy with chronic New York Heart Association class II heart failure and hemodynamically unstable VT (188 bpm).   2. Successful ICD implantation.   3. DFT less than or equal to 15 joules.   4. No early apparent complications.

## 2014-08-01 ENCOUNTER — Inpatient Hospital Stay (HOSPITAL_COMMUNITY): Payer: BC Managed Care – PPO

## 2014-08-01 LAB — CBC
HCT: 40.8 % (ref 39.0–52.0)
Hemoglobin: 13.9 g/dL (ref 13.0–17.0)
MCH: 32.1 pg (ref 26.0–34.0)
MCHC: 34.1 g/dL (ref 30.0–36.0)
MCV: 94.2 fL (ref 78.0–100.0)
PLATELETS: 129 10*3/uL — AB (ref 150–400)
RBC: 4.33 MIL/uL (ref 4.22–5.81)
RDW: 13.7 % (ref 11.5–15.5)
WBC: 6.4 10*3/uL (ref 4.0–10.5)

## 2014-08-01 LAB — GLUCOSE, CAPILLARY: Glucose-Capillary: 102 mg/dL — ABNORMAL HIGH (ref 70–99)

## 2014-08-01 LAB — BASIC METABOLIC PANEL
Anion gap: 12 (ref 5–15)
BUN: 8 mg/dL (ref 6–23)
CHLORIDE: 104 meq/L (ref 96–112)
CO2: 23 mEq/L (ref 19–32)
CREATININE: 1.02 mg/dL (ref 0.50–1.35)
Calcium: 8.6 mg/dL (ref 8.4–10.5)
GFR, EST AFRICAN AMERICAN: 88 mL/min — AB (ref >90)
GFR, EST NON AFRICAN AMERICAN: 76 mL/min — AB (ref >90)
Glucose, Bld: 119 mg/dL — ABNORMAL HIGH (ref 70–99)
Potassium: 4.4 mEq/L (ref 3.7–5.3)
Sodium: 139 mEq/L (ref 137–147)

## 2014-08-01 MED ORDER — HYDROCODONE-ACETAMINOPHEN 5-325 MG PO TABS: 1.0000 | ORAL_TABLET | ORAL | Status: DC | PRN

## 2014-08-01 NOTE — Progress Notes (Signed)
Pt discharged to home. All discharge instructions reviewed and pt questions/concerns addressed. IV removed clean dry and intact upon removal. Pt alert, oriented and awake upon discharge.

## 2014-08-01 NOTE — Progress Notes (Signed)
Called to bedside by patient. He complains of cold sweats, restlessness and general discomfort. Vital signs obtained, WDL for patient. No complaint of incisional pain or bleeding. No local or distal swelling noted. No complaint of CP or SOB. Afebrile. Patient without PMH of DM; CBG obtained as precautionary measure = 102.  Educated patient re: inflammatory response, post surgical pain and frequently observed symptoms.  Very anxious appearing. Xanax 0.25mg  PO given for comfort/anxiety.  Will reassess in one hour.

## 2014-08-01 NOTE — Progress Notes (Signed)
Patient stated he has been feeling "weird twinges" and something "pulling" on his chest this AM.  He believes that he is feeling pacing impulses from his new dual-chamber ICD.  Vitals are WDL for him. ECG this AM shows sinus bradycardia with rate in mid 50's (also WDL for him); no ST elevation or depression noted. The dressing on his left upper chest is intact with no evidence of bleeding, bruising, localized swelling or distal edema of the left arm or hand. He is not complaining of increased pain at the site and the skin surrounding the area is not erythematous or abnormally warm. He has received three doses of Ancef 1gm IV overnight on schedule.  On telemetry review, Mr. Gomes has been atrial pacing appropriately on-demand  overnight, as recently as 05:49 (~10 minutes prior to this note).  He is still very anxious concerning his new ICD's functions, and many other details in relation to his surgery.  He has been educated regarding his ICD, recovery from surgery, medications, limitations and expectations for discharge.  This education has been reinforced several times overnight as well.  Possible that he may be able to perceive atrial lead capture. Will communicate to oncoming shift. Will also communicate to ICD tech if able.  Mr. Broxson will require additional educational reinforcement upon discharge regarding his affected readiness to learn related to his high anxiety level.  Noted here for MD review.

## 2014-08-01 NOTE — Discharge Summary (Signed)
Physician Discharge Summary  Patient ID: Paul [name] MRN: [id number] DOB/AGE: September 12, 1951 63 y.o.  Admit date: 07/29/2014 Discharge date: 08/01/2014  Primary Discharge Diagnosis:  1. Sustained ventricular tachycardia which is symptomatic  Secondary Discharge Diagnosis: 2. Ischemic cardiomyopathy 3. Old inferior wall myocardial infarction 4. Previous coronary bypass grafting with occluded graft to the marginal and patent graft to right coronary artery, potential sites of ischemia include the distal posterolateral branch which is non-revascularizable and a small diagonal branch 5. Obesity 6. Depression 7. Hyperlipidemia under treatment 8. Sleep apnea 9. Demand ischemia  Procedures:  Cardiac catheterization, implantation of an implantable defibrillator, echocardiogram, cardioversion  Consults:  Electrophysiology-Dr. Caryl Comes and [facility] Course: This 63 year old male has a prior history of coronary artery disease with an inferior infarction 1999. He had bypass grafting and has had an ischemic cardiomyopathy with an ejection fraction slightly above 35% for years. He has chronic angina due to 2 occlusion of some small branch vessels and is currently on disability. He has stable symptoms and had been in his usual state of health until the day of admission when he developed severe sharp chest pain different than his previous angina. This pain persisted despite the use of nitroglycerin and his wife eventually drove him to the hospital where he was found to be in sustained ventricular tachycardia. He presented initially to the hospital in [place] and was given cardioversion and symptoms went away after he was cardioverted. He was transferred to [facility] for further evaluation.  The patient had serial troponins which showed a minimal rise to was thought due to to be rapid ventricular tachycardia and demand ischemia. Cardiac catheterization done the next day showed an occluded distal LAD  with filling from the mammary artery. The circumflex was patent with a 50% proximal stenosis. The right coronary had a 80% proximal stenosis and is occluded distally. The vein graft to the posterior descending and posterolateral branch was patent. Potential sites of ischemia exists and a distal posterolateral branch filled by collaterals as well as a small diagonal branch that also filled through the mammary graft. It was recommended that this be treated medically. The patient was continued on intravenous amiodarone after the cardioversion and was seen by electrophysiology. It was felt that he would need to have a defibrillator implanted. He underwent implantation of a Medtronic Evera XT RAXT DR model DDBB1D1serial Number [id number], the right atrial lead was a Medtronic [id number] serial # [id number] , and the right ventricular lead was a Medtronic model 6935-65serial number [id number] by Dr. Rayann Heman. He tolerated the insertion well. Amiodarone was discontinued afterwards. He did not have any recurrent ventricular tachycardia and defibrillator testing showed good function.  He was discharged in improved condition and will follow pacemaker clinic for wound check in 7-10 days and followup with me in 2 weeks. He was instructed on the defibrillator by the defibrillator nurse also.   Discharge Exam: Blood pressure 116/69, pulse 51, temperature 98.1 F (36.7 C), temperature source Oral, resp. rate 15, height 5\' 8"  (1.727 m), weight 89.404 kg (197 lb 1.6 oz), SpO2 94.00%. Weight: 89.404 kg (197 lb 1.6 oz) Lungs clear, no pericardial rub is heard, device implant site is clean and dry without hematoma  Labs: CBC:   Lab Results  Component Value Date   WBC 6.4 08/01/2014   HGB 13.9 08/01/2014   HCT 40.8 08/01/2014   MCV 94.2 08/01/2014   PLT 129* 08/01/2014    CMP:  Recent Labs Lab 07/29/14 1703  08/01/14 0440  NA 142  < > 139  K 4.4  < > 4.4  CL 106  < > 104  CO2 24  < > 23  BUN 14  < > 8  CREATININE  1.51*  < > 1.02  CALCIUM 9.0  < > 8.6  PROT 6.9  --   --   BILITOT 0.7  --   --   ALKPHOS 53  --   --   ALT 40  --   --   AST 48*  --   --   GLUCOSE 97  < > 119*  < > = values in this interval not displayed.  Lipid Panel     Component Value Date/Time   CHOL 137 07/30/2014 0650   TRIG 87 07/30/2014 0650   HDL 40 07/30/2014 0650   CHOLHDL 3.4 07/30/2014 0650   VLDL 17 07/30/2014 0650   LDLCALC 80 07/30/2014 0650    Cardiac Enzymes:  Recent Labs  07/29/14 2036 07/30/14 0149 07/30/14 0650  TROPONINI 5.92* 3.37* 3.87*    BNP (last 3 results)  Recent Labs  07/29/14 1703  PROBNP 566.9*   Thyroid: Lab Results  Component Value Date   TSH 0.781 07/29/2014   Hemoglobin A1C: Lab Results  Component Value Date   HGBA1C  Value: 5.7 (NOTE)                                                                       According to the ADA Clinical Practice Recommendations for 2011, when HbA1c is used as a screening test:   >=6.5%   Diagnostic of Diabetes Mellitus           (if abnormal result  is confirmed)  5.7-6.4%   Increased risk of developing Diabetes Mellitus  References:Diagnosis and Classification of Diabetes Mellitus,Diabetes QIHK,7425,95(GLOVF 1):S62-S69 and Standards of Medical Care in         Diabetes - 2011,Diabetes Care,2011,34  (Suppl 1):S11-S61.* 06/09/2010     Radiology: Cardiomegaly, post-insertion chest x-ray shows no pneumothorax, small left pleural effusion, device in good position  EKG: Sinus rhythm with PVCs, old inferior infarction, nonspecific ST and T-wave changes  Discharge Medications:   Medication List         aspirin EC 81 MG tablet  Take 81 mg by mouth daily.     atorvastatin 80 MG tablet  Commonly known as:  LIPITOR  Take 80 mg by mouth daily.     cetirizine 10 MG tablet  Commonly known as:  ZYRTEC  Take 10 mg by mouth daily.     clidinium-chlordiazePOXIDE 5-2.5 MG per capsule  Commonly known as:  LIBRAX  Take 1 capsule by mouth 3 (three) times  daily before meals.     clopidogrel 75 MG tablet  Commonly known as:  PLAVIX  Take 75 mg by mouth daily.     Coenzyme Q10 50 MG Caps  Take 1 capsule by mouth daily.     digoxin 0.125 MG tablet  Commonly known as:  LANOXIN  Take 0.125 mg by mouth daily.     eplerenone 25 MG tablet  Commonly known as:  INSPRA  Take 25 mg by mouth daily.     fenofibrate 160 MG tablet  Take 160 mg by  mouth daily.     Fish Oil 1000 MG Caps  Take 1 capsule by mouth daily.     folic acid 701 MCG tablet  Commonly known as:  FOLVITE  Take 400 mcg by mouth daily.     HYDROcodone-acetaminophen 5-325 MG per tablet  Commonly known as:  NORCO/VICODIN  Take 1-2 tablets by mouth every 4 (four) hours as needed for moderate pain.     metoprolol succinate 100 MG 24 hr tablet  Commonly known as:  TOPROL-XL  Take 100 mg by mouth daily. Take with or immediately following a meal.     multivitamin with minerals Tabs tablet  Take 1 tablet by mouth daily.     pantoprazole 40 MG tablet  Commonly known as:  PROTONIX  Take 40 mg by mouth daily.     ramipril 10 MG capsule  Commonly known as:  ALTACE  Take 10 mg by mouth daily.     vitamin C 1000 MG tablet  Take 1,000 mg by mouth daily.     Vitamin E 400 UNITS Tabs  Take 800 Units by mouth daily.         Followup plans and appointments: Follow up Dr. Wynonia Lawman in 2 weeks. He is to follow up with the device clinic for wound check and also to followup with Dr. Rayann Heman down the Road.  Time spent with patient to include physician time:  30 minutes  Signed: W. Doristine Church. MD [facility] 08/01/2014, 8:37 AM

## 2014-08-01 NOTE — Progress Notes (Signed)
Subjective: Very anxious, complains of awareness of heart beating.  Objective: Vital signs in last 24 hours: Temp:  [98.1 F (36.7 C)-98.6 F (37 C)] 98.1 F (36.7 C) (10/09 0622) Pulse Rate:  [49-74] 51 (10/09 0622) Resp:  [13-22] 15 (10/09 0622) BP: (104-129)/(57-73) 116/69 mmHg (10/09 0622) SpO2:  [91 %-97 %] 94 % (10/09 0622) Weight:  [197 lb 1.6 oz (89.404 kg)] 197 lb 1.6 oz (89.404 kg) (10/09 0622) Weight change:  Last BM Date: 07/29/14 Intake/Output from previous day: 10/08 0701 - 10/09 0700 In: 1083.6 [P.O.:480; I.V.:603.6] Out: 1525 [Urine:1525] Intake/Output this shift:    PE: General:Pleasant affect, NAD Skin:Warm and dry, brisk capillary refill HEENT:normocephalic, sclera clear, mucus membranes moist Heart:S1S2 RRR without murmur, gallup, rub or click Lungs:clear without rales, rhonchi, or wheezes ZOX:WRUE, non tender, + BS, do not palpate liver spleen or masses Ext:no lower ext edema, 2+ pedal pulses, 2+ radial pulses Neuro:alert and oriented X 3, MAE, follows commands, + facial symmetry Tele: Sr-SB occ atrial pacing  Lab Results:  Recent Labs  07/31/14 0355 08/01/14 0440  WBC 5.6 6.4  HGB 14.0 13.9  HCT 41.4 40.8  PLT 147* 129*   BMET  Recent Labs  07/31/14 0355 08/01/14 0440  NA 142 139  K 4.2 4.4  CL 108 104  CO2 21 23  GLUCOSE 93 119*  BUN 7 8  CREATININE 1.08 1.02  CALCIUM 8.4 8.6    Recent Labs  07/30/14 0149 07/30/14 0650  TROPONINI 3.37* 3.87*    Lab Results  Component Value Date   CHOL 137 07/30/2014   HDL 40 07/30/2014   LDLCALC 80 07/30/2014   TRIG 87 07/30/2014   CHOLHDL 3.4 07/30/2014   Lab Results  Component Value Date   HGBA1C  Value: 5.7 (NOTE)                                                                       According to the ADA Clinical Practice Recommendations for 2011, when HbA1c is used as a screening test:   >=6.5%   Diagnostic of Diabetes Mellitus           (if abnormal result  is  confirmed)  5.7-6.4%   Increased risk of developing Diabetes Mellitus  References:Diagnosis and Classification of Diabetes Mellitus,Diabetes AVWU,9811,91(YNWGN 1):S62-S69 and Standards of Medical Care in         Diabetes - 2011,Diabetes Care,2011,34  (Suppl 1):S11-S61.* 06/09/2010     Lab Results  Component Value Date   TSH 0.781 07/29/2014    Hepatic Function Panel  Recent Labs  07/29/14 1703  PROT 6.9  ALBUMIN 3.7  AST 48*  ALT 40  ALKPHOS 53  BILITOT 0.7    Recent Labs  07/30/14 0650  CHOL 137   No results found for this basename: PROTIME,  in the last 72 hours     Studies/Results: Dg Chest 2 View  08/01/2014   CLINICAL DATA:  Coronary artery disease. Status post CABG procedure. Device implantation.  EXAM: CHEST  2 VIEW  COMPARISON:  07/29/2014  FINDINGS: Interval insertion of dual lead AICD device with leads projecting the expected location of the right atrium and right ventricle. Stable cardiomegaly. Lungs are well  aerated. Minimal heterogeneous opacities left lung base. Small left pleural effusion. No pneumothorax. Leads overlie the patient.  IMPRESSION: Interval insertion dual lead AICD device with leads projecting over the expected location of the right atrium and right ventricle.  Small left pleural effusion. Minimal left basilar opacities suggestive of atelectasis.   Electronically Signed   By: Lovey Newcomer M.D.   On: 08/01/2014 08:17    Medications: I have reviewed the patient's current medications. Scheduled Meds: . aspirin EC  81 mg Oral Daily  .  ceFAZolin (ANCEF) IV  1 g Intravenous Q6H  . clidinium-chlordiazePOXIDE  1 capsule Oral TID AC  . clopidogrel  75 mg Oral Daily  . folic acid  0.5 mg Oral Daily  . metoprolol tartrate  50 mg Oral Once  . metoprolol succinate  100 mg Oral Daily  . multivitamin with minerals  1 tablet Oral Daily  . pantoprazole  40 mg Oral Daily  . ramipril  10 mg Oral Daily  . rosuvastatin  40 mg Oral q1800  . sodium chloride  3 mL  Intravenous Q12H  . spironolactone  25 mg Oral Daily   Continuous Infusions:  PRN Meds:.sodium chloride, acetaminophen, ALPRAZolam, HYDROcodone-acetaminophen, nitroGLYCERIN, ondansetron (ZOFRAN) IV, sodium chloride  Assessment/Plan: POSTPROCEDURE DIAGNOSES:  1. Ischemic cardiomyopathy.  2. New York Heart Association class II, heart failure chronically.  3. Hemodynamically unstable ventricular tachycardia Medtronic Evera XT DR  Active Problems:   Ventricular tachycardia- sustained, with cardioversion.  No driving for 6 months  s/p dual chamber ICD.  CXR stable with small lt pl effusion   Discharge instructions reviewed, no driving for 6 months.   LOS: 3 days   Time spent with pt. : 15 minutes. [facility] R  Nurse Practitioner Certified Pager [phone] or after 5pm and on weekends call [phone] 08/01/2014, 8:39 AM   I have seen, examined the patient, and reviewed the above assessment and plan.  Changes to above are made where necessary.    Device interrogation is reviewed and normal.  Doing well s/p ICD.  Routine wound care and follow-up  Co Sign: Thompson Grayer, MD 08/01/2014 9:32 AM

## 2014-08-01 NOTE — Discharge Instructions (Signed)
° ° °  Supplemental Discharge Instructions for  Pacemaker/Defibrillator Patients  Activity No heavy lifting or vigorous activity with your left arm for 6 to 8 weeks.  Do not raise your left arm above your head for one week.  Gradually raise your affected arm as drawn below.           __10/10/15                     08/04/14                   08/06/14               08/08/14  NO DRIVING for 6 months     ; you may begin driving on  0/9/64.  WOUND CARE   Keep the wound area clean and dry.  Do not get this area wet for one week. No showers for one week; you may shower on 08/08/14    .   The tape/steri-strips on your wound will fall off; do not pull them off.  No bandage is needed on the site.  DO  NOT apply any creams, oils, or ointments to the wound area.   If you notice any drainage or discharge from the wound, any swelling or bruising at the site, or you develop a fever > 101? F after you are discharged home, call the office at once.  Special Instructions   You are still able to use cellular telephones; use the ear opposite the side where you have your pacemaker/defibrillator.  Avoid carrying your cellular phone near your device.   When traveling through airports, show security personnel your identification card to avoid being screened in the metal detectors.  Ask the security personnel to use the hand wand.   Avoid arc welding equipment, MRI testing (magnetic resonance imaging), TENS units (transcutaneous nerve stimulators).  Call the office for questions about other devices.   Avoid electrical appliances that are in poor condition or are not properly grounded.   Microwave ovens are safe to be near or to operate.  Additional information for defibrillator patients should your device go off:   If your device goes off ONCE and you feel fine afterward, notify the device clinic nurses.   If your device goes off ONCE and you do not feel well afterward, call 911.   If your device goes off TWICE,  call 911.   If your device goes off THREE times in one day, call 911.  DO NOT DRIVE YOURSELF OR A FAMILY MEMBER WITH A DEFIBRILLATOR TO THE HOSPITAL--CALL 911.

## 2014-08-11 ENCOUNTER — Encounter: Payer: Self-pay | Admitting: Internal Medicine

## 2014-08-11 ENCOUNTER — Ambulatory Visit (INDEPENDENT_AMBULATORY_CARE_PROVIDER_SITE_OTHER): Payer: BC Managed Care – PPO | Admitting: *Deleted

## 2014-08-11 DIAGNOSIS — I472 Ventricular tachycardia, unspecified: Secondary | ICD-10-CM

## 2014-08-11 LAB — MDC_IDC_ENUM_SESS_TYPE_INCLINIC
Battery Remaining Longevity: 137 mo
Brady Statistic AS VS Percent: 0.1 %
Brady Statistic RV Percent Paced: 0.1 %
Date Time Interrogation Session: 20151019160009
HighPow Impedance: 171 Ohm
HighPow Impedance: 67 Ohm
Lead Channel Impedance Value: 570 Ohm
Lead Channel Pacing Threshold Amplitude: 0.75 V
Lead Channel Pacing Threshold Amplitude: 1.25 V
Lead Channel Pacing Threshold Pulse Width: 0.4 ms
Lead Channel Sensing Intrinsic Amplitude: 10.4 mV
Lead Channel Sensing Intrinsic Amplitude: 14 mV
Lead Channel Sensing Intrinsic Amplitude: 3.6 mV
Lead Channel Setting Pacing Amplitude: 3.5 V
Lead Channel Setting Sensing Sensitivity: 0.3 mV
MDC IDC MSMT LEADCHNL RA IMPEDANCE VALUE: 532 Ohm
MDC IDC MSMT LEADCHNL RA PACING THRESHOLD PULSEWIDTH: 0.4 ms
MDC IDC SET LEADCHNL RV PACING AMPLITUDE: 3.5 V
MDC IDC SET LEADCHNL RV PACING PULSEWIDTH: 0.4 ms
MDC IDC SET ZONE DETECTION INTERVAL: 300 ms
MDC IDC STAT BRADY RA PERCENT PACED: 92.2 %
Zone Setting Detection Interval: 450 ms

## 2014-08-11 NOTE — Progress Notes (Signed)

## 2014-10-02 ENCOUNTER — Encounter (HOSPITAL_COMMUNITY): Payer: Self-pay | Admitting: Cardiology

## 2014-11-03 ENCOUNTER — Encounter: Payer: BC Managed Care – PPO | Admitting: Internal Medicine

## 2014-12-01 ENCOUNTER — Ambulatory Visit (INDEPENDENT_AMBULATORY_CARE_PROVIDER_SITE_OTHER): Payer: PPO | Admitting: Internal Medicine

## 2014-12-01 ENCOUNTER — Encounter: Payer: Self-pay | Admitting: Internal Medicine

## 2014-12-01 VITALS — BP 138/70 | HR 58 | Ht 68.0 in | Wt 197.0 lb

## 2014-12-01 DIAGNOSIS — I25810 Atherosclerosis of coronary artery bypass graft(s) without angina pectoris: Secondary | ICD-10-CM

## 2014-12-01 DIAGNOSIS — G4730 Sleep apnea, unspecified: Secondary | ICD-10-CM

## 2014-12-01 DIAGNOSIS — I255 Ischemic cardiomyopathy: Secondary | ICD-10-CM

## 2014-12-01 DIAGNOSIS — I472 Ventricular tachycardia, unspecified: Secondary | ICD-10-CM

## 2014-12-01 LAB — MDC_IDC_ENUM_SESS_TYPE_INCLINIC
Brady Statistic AP VP Percent: 0.1 % — CL
Brady Statistic AP VS Percent: 4.2 %
Brady Statistic AS VS Percent: 95.8 %
HighPow Impedance: 66 Ohm
Lead Channel Impedance Value: 475 Ohm
Lead Channel Pacing Threshold Amplitude: 0.75 V
Lead Channel Pacing Threshold Pulse Width: 0.4 ms
Lead Channel Pacing Threshold Pulse Width: 0.4 ms
Lead Channel Sensing Intrinsic Amplitude: 2.8 mV
Lead Channel Sensing Intrinsic Amplitude: 8.8 mV
Lead Channel Setting Pacing Amplitude: 3.5 V
Lead Channel Setting Pacing Pulse Width: 0.4 ms
Lead Channel Setting Sensing Sensitivity: 0.3 mV
MDC IDC MSMT LEADCHNL RV IMPEDANCE VALUE: 361 Ohm
MDC IDC MSMT LEADCHNL RV PACING THRESHOLD AMPLITUDE: 1 V
MDC IDC SET LEADCHNL RV PACING AMPLITUDE: 3.5 V
MDC IDC STAT BRADY AS VP PERCENT: 0.1 % — AB
Zone Setting Detection Interval: 300 ms
Zone Setting Detection Interval: 450 ms

## 2014-12-01 NOTE — Patient Instructions (Signed)
Remote monitoring is used to monitor your Pacemaker or ICD from home. This monitoring reduces the number of office visits required to check your device to one time per year. It allows [place] to keep an eye on the functioning of your device to ensure it is working properly. You are scheduled for a device check from home on 03/02/15. You may send your transmission at any time that day. If you have a wireless device, the transmission will be sent automatically. After your physician reviews your transmission, you will receive a postcard with your next transmission date.  Your physician wants you to follow-up in: 12 months with Dr Vallery Ridge will receive a reminder letter in the mail two months in advance. If you don't receive a letter, please call our office to schedule the follow-up appointment.

## 2014-12-01 NOTE — Progress Notes (Signed)
Electrophysiology Office Note   Date:  12/01/2014   ID:  Paul [name], DOB 19-Mar-1951, MRN [id number]  PCP:  Chesley Noon, MD  Cardiologist:  Dr Wynonia Lawman Primary Electrophysiologist: Thompson Grayer, MD    Chief Complaint  Patient presents with  . Follow-up    VT & ICM     History of Present Illness: Paul [name] is a 64 y.o. male who presents today for electrophysiology evaluation.   The patient has done well since his ICD was implanted.  He has not required ICD therapy or had any sustained VT.  He is returning to normal activity. Today, he denies symptoms of palpitations, chest pain, shortness of breath, orthopnea, PND, lower extremity edema, claudication, dizziness, presyncope, syncope, bleeding, or neurologic sequela. The patient is tolerating medications without difficulties and is otherwise without complaint today.    Past Medical History  Diagnosis Date  . CAD (coronary artery disease)     widely patent LAD LIMA graft, widely patent PDA PLR SVG, occluded Diag 1 OM 1 SVG by cardiac CT 2007   . Ischemic cardiomyopathy     Prior inferior infarct EF 35% by ECHO 07/13/11    . Depression   . S/P CABG (coronary artery bypass graft)     CABG 06/1998 [place], [place] with LIMA to LAD, SVG to OM, SVG to PD-PL.   OM graft occluded 10/12/2005 by CTA   . Sleep apnea   . Hyperlipidemia   . GERD (gastroesophageal reflux disease)   . Ventricular tachycardia 10/15    VT at 188 bpm- unstable requiring external defibrillation   Past Surgical History  Procedure Laterality Date  . Coronary artery bypass graft  1999  . Appendectomy    . Left heart catheterization with coronary/graft angiogram N/A 07/30/2014    Procedure: LEFT HEART CATHETERIZATION WITH Beatrix [name];  Surgeon: Jacolyn Reedy, MD;  Location: [facility] CATH LAB;  Service: Cardiovascular;  Laterality: N/A;  . Implantable cardioverter defibrillator implant N/A 07/31/2014    MDT Gwyneth Revels XT DR ICD implanted by Dr Rayann Heman for  secondary treatent of VT     Current Outpatient Prescriptions  Medication Sig Dispense Refill  . ALPRAZolam (XANAX) 0.5 MG tablet Take 1/2 to 1 tablet by mouth daily as needed for anxiety    . Ascorbic Acid (VITAMIN C) 1000 MG tablet Take 1,000 mg by mouth daily.    Marland Kitchen aspirin EC 81 MG tablet Take 81 mg by mouth daily.    Marland Kitchen atorvastatin (LIPITOR) 80 MG tablet Take 80 mg by mouth daily.    . cetirizine (ZYRTEC) 10 MG tablet Take 10 mg by mouth daily.    . clopidogrel (PLAVIX) 75 MG tablet Take 75 mg by mouth daily.    . Coenzyme Q10 50 MG CAPS Take 1 capsule by mouth daily.    . digoxin (LANOXIN) 0.125 MG tablet Take 0.125 mg by mouth daily.    . diphenoxylate-atropine (LOMOTIL) 2.5-0.025 MG per tablet Take 1 tablet by mouth 4 (four) times daily as needed. diarrhea    . eplerenone (INSPRA) 25 MG tablet Take 25 mg by mouth daily.    . fenofibrate 160 MG tablet Take 160 mg by mouth daily.    . folic acid (FOLVITE) 010 MCG tablet Take 400 mcg by mouth daily.    Marland Kitchen HYDROcodone-acetaminophen (NORCO/VICODIN) 5-325 MG per tablet Take 1-2 tablets by mouth every 4 (four) hours as needed for moderate pain. 30 tablet 0  . metoprolol succinate (TOPROL-XL) 100 MG 24 hr tablet  Take 100 mg by mouth daily. Take with or immediately following a meal.    . Multiple Vitamin (MULTIVITAMIN WITH MINERALS) TABS tablet Take 1 tablet by mouth daily.    . Omega-3 Fatty Acids (FISH OIL) 1000 MG CAPS Take 1 capsule by mouth daily.    Marland Kitchen omeprazole (PRILOSEC) 20 MG capsule Take 20 mg by mouth daily.    . ramipril (ALTACE) 10 MG capsule Take 10 mg by mouth daily.    . Vitamin E 400 UNITS TABS Take 800 Units by mouth daily.    Marland Kitchen dicyclomine (BENTYL) 10 MG capsule Take 10 mg by mouth 30 minutes before meals     No current facility-administered medications for this visit.    Allergies:   Carvedilol; Escitalopram oxalate; Niacin and related; and Spironolactone   Social History:  The patient  reports that he has never  smoked. He has never used smokeless tobacco. He reports that he drinks about 1.2 oz of alcohol per week. He reports that he does not use illicit drugs.   Family History:  The patient's family history includes Cerebral aneurysm in his father; Hepatitis in his brother.    ROS:  Please see the history of present illness.   All other systems are reviewed and negative.    PHYSICAL EXAM: VS:  BP 138/70 mmHg  Pulse 58  Ht 5\' 8"  (1.727 m)  Wt 197 lb (89.359 kg)  BMI 29.96 kg/m2 , BMI Body mass index is 29.96 kg/(m^2). GEN: Well nourished, well developed, in no acute distress HEENT: normal Neck: no JVD, carotid bruits, or masses Cardiac: RRR; no murmurs, rubs, or gallops,no edema  Respiratory:  clear to auscultation bilaterally, normal work of breathing GI: soft, nontender, nondistended, + BS MS: no deformity or atrophy Skin: warm and dry, device pocket is well healed Neuro:  Strength and sensation are intact Psych: euthymic mood, full affect  EKG:  EKG is ordered today. The ekg ordered today shows sinus rhythm 58 bpm, inferior infarct, nonspecific ST/T changes  Device interrogation is reviewed today in detail.  See PaceArt for details.   Recent Labs: 07/29/2014: ALT 40; Pro B Natriuretic peptide (BNP) 566.9*; TSH 0.781 07/31/2014: Magnesium 2.4 08/01/2014: BUN 8; Creatinine 1.02; Hemoglobin 13.9; Platelets 129*; Potassium 4.4; Sodium 139    Lipid Panel     Component Value Date/Time   CHOL 137 07/30/2014 0650   TRIG 87 07/30/2014 0650   HDL 40 07/30/2014 0650   CHOLHDL 3.4 07/30/2014 0650   VLDL 17 07/30/2014 0650   LDLCALC 80 07/30/2014 0650     Wt Readings from Last 3 Encounters:  12/01/14 197 lb (89.359 kg)  08/01/14 197 lb 1.6 oz (89.404 kg)  01/19/12 192 lb (87.091 kg)      Other studies Reviewed: Additional studies/ records that were reviewed today include: Hospital records  Review of the above records today demonstrates: Vt at 188 bpm for which he required  external defibrillation and subsequent ICD implant   ASSESSMENT AND PLAN:  1.  VT Doing well s/p ICD No driving x 6 months from prior VT episode (patient is aware) Normal ICD function See Pace Art report No changes today  2. Ischemic CM/ chronic systolic dysfunction No ischemic symptoms euvolemic today No changes  3. OSA Compliance with dental device is advised  Current medicines are reviewed at length with the patient today.   The patient does not have concerns regarding his medicines.  The following changes were made today:  none   Follow-up:  Follow-up with Dr Wynonia Lawman as scheduled EP to follow carelink Return to see me in 1 year  Signed, Thompson Grayer, MD  12/01/2014 1:00 PM     [place] [place] [address] [phone] (office) [phone] (fax)

## 2014-12-03 ENCOUNTER — Encounter: Payer: Self-pay | Admitting: Cardiology

## 2015-01-01 ENCOUNTER — Telehealth: Payer: Self-pay | Admitting: Internal Medicine

## 2015-01-01 NOTE — Telephone Encounter (Signed)
Called patient back and he says last night he noticed 2 knots below the incision line of his device. No pain, no redness, and no drainage.  I have offered him an appointment for today at 1:45.  He says he is already in [place] about to watch the [place] game and can he come tomorrow.  I let him know I would ask in the clinic if there is an appointment for tomorrow and call him back.  Spoke with Cyril Mourning in device and she said we could add him of tomorrow around 10.  Left a message for him to come tomorrow at 10:00am

## 2015-01-01 NOTE — Telephone Encounter (Signed)
New message      1. Has your device fired? no 2. Is you device beeping? no  3. Are you experiencing draining or swelling at device site? no  4. Are you calling to see if we received your device transmission? no  5. Have you passed out?pt had defib put in chest in oct, he noticed 2 pea size knots below the scar---not painful.  What does this mean?

## 2015-01-02 ENCOUNTER — Ambulatory Visit (INDEPENDENT_AMBULATORY_CARE_PROVIDER_SITE_OTHER): Payer: PPO | Admitting: *Deleted

## 2015-01-02 DIAGNOSIS — I255 Ischemic cardiomyopathy: Secondary | ICD-10-CM

## 2015-01-02 NOTE — Progress Notes (Signed)
Pt seen for site check--bumps at site and had never noticed before. Site checked and all was normal.

## 2015-08-31 ENCOUNTER — Other Ambulatory Visit: Payer: Self-pay

## 2015-08-31 ENCOUNTER — Ambulatory Visit (INDEPENDENT_AMBULATORY_CARE_PROVIDER_SITE_OTHER): Payer: PPO | Admitting: Internal Medicine

## 2015-08-31 ENCOUNTER — Encounter: Payer: Self-pay | Admitting: Internal Medicine

## 2015-08-31 VITALS — BP 120/74 | HR 62 | Ht 68.0 in | Wt 206.2 lb

## 2015-08-31 DIAGNOSIS — I472 Ventricular tachycardia, unspecified: Secondary | ICD-10-CM

## 2015-08-31 DIAGNOSIS — I25810 Atherosclerosis of coronary artery bypass graft(s) without angina pectoris: Secondary | ICD-10-CM

## 2015-08-31 DIAGNOSIS — Z95810 Presence of automatic (implantable) cardiac defibrillator: Secondary | ICD-10-CM

## 2015-08-31 DIAGNOSIS — I255 Ischemic cardiomyopathy: Secondary | ICD-10-CM

## 2015-08-31 MED ORDER — METOPROLOL SUCCINATE ER 100 MG PO TB24: ORAL_TABLET | ORAL | Status: DC

## 2015-08-31 NOTE — Progress Notes (Signed)
Electrophysiology Office Note   Date:  08/31/2015   ID:  LANTZ HERMANN, DOB [birth year], MRN [id number]  PCP:  Chesley Noon, MD  Cardiologist:  Dr Wynonia Lawman Primary Electrophysiologist: Thompson Grayer, MD    Chief Complaint  Patient presents with  . VT  . ICM     History of Present Illness: ROSTON GRUNEWALD is a 64 y.o. male who presents today for electrophysiology evaluation.  The patient is doing well, without symptoms. Today, he denies symptoms of palpitations, chest pain, shortness of breath, orthopnea, PND, lower extremity edema, claudication, dizziness, presyncope, syncope, bleeding, or neurologic sequela. The patient is tolerating medications without difficulties and is otherwise without complaint today.    Past Medical History  Diagnosis Date  . CAD (coronary artery disease)     widely patent LAD LIMA graft, widely patent PDA PLR SVG, occluded Diag 1 OM 1 SVG by cardiac CT 2007   . Ischemic cardiomyopathy     Prior inferior infarct EF 35% by ECHO 07/13/11    . Depression   . S/P CABG (coronary artery bypass graft)     CABG 06/1998 [place], [place] with LIMA to LAD, SVG to OM, SVG to PD-PL.   OM graft occluded 10/12/2005 by CTA   . Sleep apnea   . Hyperlipidemia   . GERD (gastroesophageal reflux disease)   . Ventricular tachycardia ([place]) 10/15    VT at 188 bpm- unstable requiring external defibrillation   Past Surgical History  Procedure Laterality Date  . Coronary artery bypass graft  1999  . Appendectomy    . Left heart catheterization with coronary/graft angiogram N/A 07/30/2014    Procedure: LEFT HEART CATHETERIZATION WITH Beatrix Fetters;  Surgeon: Jacolyn Reedy, MD;  Location: [facility] CATH LAB;  Service: Cardiovascular;  Laterality: N/A;  . Implantable cardioverter defibrillator implant N/A 07/31/2014    MDT Gwyneth Revels XT DR ICD implanted by Dr Rayann Heman for secondary treatent of VT     Current Outpatient Prescriptions  Medication Sig Dispense Refill  . ALPRAZolam  (XANAX) 0.5 MG tablet Take 1/2 to 1 tablet by mouth daily as needed for anxiety    . Ascorbic Acid (VITAMIN C) 1000 MG tablet Take 1,000 mg by mouth daily.    Marland Kitchen aspirin EC 81 MG tablet Take 81 mg by mouth daily.    Marland Kitchen atorvastatin (LIPITOR) 80 MG tablet Take 80 mg by mouth daily.    . cetirizine (ZYRTEC) 10 MG tablet Take 10 mg by mouth daily.    . clopidogrel (PLAVIX) 75 MG tablet Take 75 mg by mouth daily.    . Coenzyme Q10 50 MG CAPS Take 1 capsule by mouth daily.    . digoxin (LANOXIN) 0.125 MG tablet Take 0.125 mg by mouth daily.    . diphenoxylate-atropine (LOMOTIL) 2.5-0.025 MG per tablet Take 1 tablet by mouth 4 (four) times daily as needed. diarrhea    . eplerenone (INSPRA) 25 MG tablet Take 25 mg by mouth daily.    . fenofibrate 160 MG tablet Take 160 mg by mouth daily.    . folic acid (FOLVITE) 242 MCG tablet Take 400 mcg by mouth daily.    Marland Kitchen HYDROcodone-acetaminophen (NORCO/VICODIN) 5-325 MG per tablet Take 1-2 tablets by mouth every 4 (four) hours as needed for moderate pain. 30 tablet 0  . metoprolol succinate (TOPROL-XL) 100 MG 24 hr tablet Take 100 mg by mouth daily. Take with or immediately following a meal.    . Multiple Vitamin (MULTIVITAMIN WITH MINERALS) TABS tablet Take  1 tablet by mouth daily.    . Omega-3 Fatty Acids (FISH OIL) 1000 MG CAPS Take 1 capsule by mouth daily.    Marland Kitchen omeprazole (PRILOSEC) 20 MG capsule Take 20 mg by mouth daily.    . ramipril (ALTACE) 10 MG capsule Take 10 mg by mouth daily.    . Vitamin E 400 UNITS TABS Take 800 Units by mouth daily.     No current facility-administered medications for this visit.    Allergies:   Carvedilol; Escitalopram oxalate; Niacin and related; and Spironolactone   Social History:  The patient  reports that he has never smoked. He has never used smokeless tobacco. He reports that he drinks about 1.2 oz of alcohol per week. He reports that he does not use illicit drugs.   Family History:  The patient's family history  includes Cerebral aneurysm in his father; Hepatitis in his brother.    ROS:  Please see the history of present illness.   All other systems are reviewed and negative.    PHYSICAL EXAM: VS:  BP 120/74 mmHg  Pulse 62  Ht 5\' 8"  (1.727 m)  Wt 206 lb 3.2 oz (93.532 kg)  BMI 31.36 kg/m2 , BMI Body mass index is 31.36 kg/(m^2). GEN: Well nourished, well developed, in no acute distress HEENT: normal Neck: no JVD, carotid bruits, or masses Cardiac: RRR; no murmurs, rubs, or gallops,no edema  Respiratory:  clear to auscultation bilaterally, normal work of breathing GI: soft, nontender, nondistended, + BS MS: no deformity or atrophy Skin: warm and dry, device pocket is well healed Neuro:  Strength and sensation are intact Psych: euthymic mood, full affect  Device interrogation is reviewed today in detail.  See PaceArt for details.   Lipid Panel     Component Value Date/Time   CHOL 137 07/30/2014 0650   TRIG 87 07/30/2014 0650   HDL 40 07/30/2014 0650   CHOLHDL 3.4 07/30/2014 0650   VLDL 17 07/30/2014 0650   LDLCALC 80 07/30/2014 0650     Wt Readings from Last 3 Encounters:  08/31/15 206 lb 3.2 oz (93.532 kg)  12/01/14 197 lb (89.359 kg)  08/01/14 197 lb 1.6 oz (89.404 kg)     ASSESSMENT AND PLAN:  1.  VT ICD interrogation reveals 5 episodes of ATP treated VT (174 bpm).  Most episodes are nocturnal and are asymptomatic He says that Dr Thurman Coyer office told him that his remotes are normal and does not recall being told not to drive. Normal ICD function See Claudia Desanctis Art report I have lowered VT zone from 171 to 167 bpm due to VT below detection also seen on the device Increase toprol to 100mg  qam and 50mg  qpm at this time Pt instructed to not drive x 6 months from time of VT in September  2. Ischemic CM/ chronic systolic dysfunction No ischemic symptoms euvolemic today No changes  3. OSA Compliance with dental device is advised  Current medicines are reviewed at length  with the patient today.   The patient does not have concerns regarding his medicines.  The following changes were made today:  none   Follow-up:  Follow-up with Dr Wynonia Lawman as scheduled I will request from Dr Wynonia Lawman that EP follow carelink Return to see EP NP in 1 year  Signed, Thompson Grayer, MD  08/31/2015 2:48 PM     [place] [address] [place] [address] [phone] (office) [phone] (fax)

## 2015-08-31 NOTE — Patient Instructions (Addendum)
Medication Instructions:  Your physician has recommended you make the following change in your medication:  1) Increase Toprol to 100mg  in the morning and 50 mg at night   Labwork: None ordered    Testing/Procedures: None ordered   Follow-Up:  Your physician wants you to follow-up in: 12 months with [name], NP     Any Other Special Instructions Will Be Listed Below (If Applicable).     If you need a refill on your cardiac medications before your next appointment, please call your pharmacy.

## 2015-09-01 LAB — CUP PACEART INCLINIC DEVICE CHECK
Battery Remaining Longevity: 124 mo
Battery Voltage: 3 V
Brady Statistic AP VP Percent: 0.01 %
Brady Statistic AS VS Percent: 96.11 %
Brady Statistic RV Percent Paced: 0.06 %
HighPow Impedance: 66 Ohm
Implantable Lead Implant Date: 20151008
Implantable Lead Location: 753859
Implantable Lead Location: 753860
Implantable Lead Model: 6935
Lead Channel Impedance Value: 304 Ohm
Lead Channel Impedance Value: 342 Ohm
Lead Channel Impedance Value: 475 Ohm
Lead Channel Pacing Threshold Amplitude: 1 V
Lead Channel Pacing Threshold Pulse Width: 0.4 ms
Lead Channel Pacing Threshold Pulse Width: 0.4 ms
Lead Channel Sensing Intrinsic Amplitude: 3 mV
Lead Channel Setting Pacing Amplitude: 2.25 V
Lead Channel Setting Pacing Amplitude: 2.5 V
MDC IDC LEAD IMPLANT DT: 20151008
MDC IDC MSMT LEADCHNL RV PACING THRESHOLD AMPLITUDE: 1 V
MDC IDC MSMT LEADCHNL RV SENSING INTR AMPL: 9.625 mV
MDC IDC SESS DTM: 20161107204209
MDC IDC SET LEADCHNL RV PACING PULSEWIDTH: 0.4 ms
MDC IDC SET LEADCHNL RV SENSING SENSITIVITY: 0.3 mV
MDC IDC STAT BRADY AP VS PERCENT: 3.83 %
MDC IDC STAT BRADY AS VP PERCENT: 0.05 %
MDC IDC STAT BRADY RA PERCENT PACED: 3.84 %

## 2015-09-14 ENCOUNTER — Encounter: Payer: Self-pay | Admitting: Physician Assistant

## 2015-09-29 ENCOUNTER — Encounter: Payer: Self-pay | Admitting: Cardiology

## 2015-10-02 ENCOUNTER — Ambulatory Visit: Payer: PPO | Admitting: Physician Assistant

## 2015-10-14 ENCOUNTER — Telehealth: Payer: Self-pay | Admitting: Internal Medicine

## 2015-10-14 NOTE — Telephone Encounter (Signed)
Spoke w/ pt wife and she is going to have pt return my call.

## 2015-10-14 NOTE — Telephone Encounter (Signed)
Spoke w/ pt and informed him that his remote transmission was received. Pt had further questions. Forwarded call to device tech, RN

## 2015-10-14 NOTE — Telephone Encounter (Signed)
°  1. Has your device fired? No    2. Is you device beeping? no  3. Are you experiencing draining or swelling at device site? no  4. Are you calling to see if we received your device transmission? yes  5. Have you passed out? no   Pt stated there is some confusion about his transmission and he want to know the results, b/c Dr Wynonia Lawman stated he can't read it anymore. Please call pt.

## 2015-10-14 NOTE — Telephone Encounter (Signed)
Spoke with patient. Informed him of device clinic protocol for follow up. Pt informed that he could not be followed remotely by more than one clinic. Assured that if there were any episodes we would be alerted remotely and there have been no alerts since last OV with Dr. Rayann Heman. Pt will send manual transmission today to verify there have been no episodes, I will call if there are issues on remote transmission. Pt agreeable.   Todays remote reviewed- no episodes.

## 2015-10-19 ENCOUNTER — Ambulatory Visit (INDEPENDENT_AMBULATORY_CARE_PROVIDER_SITE_OTHER): Payer: PPO | Admitting: Family Medicine

## 2015-10-19 VITALS — BP 116/70 | HR 57 | Temp 97.6°F | Resp 16 | Ht 68.0 in | Wt 200.4 lb

## 2015-10-19 DIAGNOSIS — H9203 Otalgia, bilateral: Secondary | ICD-10-CM | POA: Diagnosis not present

## 2015-10-19 DIAGNOSIS — H65193 Other acute nonsuppurative otitis media, bilateral: Secondary | ICD-10-CM | POA: Diagnosis not present

## 2015-10-19 MED ORDER — AMOXICILLIN 500 MG PO CAPS: 1000.0000 mg | ORAL_CAPSULE | Freq: Two times a day (BID) | ORAL | Status: DC

## 2015-10-19 NOTE — Progress Notes (Signed)
[facility] [address], [place] [address] [phone]  Date:  10/19/2015   Name:  Paul [name]   DOB:  04/26/1951   MRN:  [id number]  PCP:  Chesley Noon, MD    Chief Complaint: Sinusitis; Nasal Congestion; and Sore Throat   History of Present Illness:  Paul [name] is a 64 y.o. very pleasant male patient who presents with the following:  Here today with illness- new patient.  He has a history of CAD, CABG, VT with defibrillator in place He notes illness today- started 2 days ago.  He went to bed early and noted that his right ear hurt.  Last night he had the ear pain again- he has noted some PND and his left ear hurt last night.  He did not sleep well.  No drainage from his ears but they wer both "popping and cracking."  He had painful popping in his ears with nose blowing.   He has had some mild sneezing, runny nose prior to eat pain beginning.  No cough noted He is blowing clear material out of his nose.   He has the worst drainage when he tries to lie down.   He felt hot last night but does not know if he had a fever at all.   He has noted some sinus pressure and pain also   Patient Active Problem List   Diagnosis Date Noted  . AICD (automatic cardioverter/defibrillator) present 07/31/2014  . Ventricular tachycardia ([place]) 07/29/2014  . CAD (coronary artery disease)   . S/P CABG (coronary artery bypass graft)   . Hyperlipidemia   . Sleep apnea   . Depression   . Ischemic cardiomyopathy     Past Medical History  Diagnosis Date  . CAD (coronary artery disease)     widely patent LAD LIMA graft, widely patent PDA PLR SVG, occluded Diag 1 OM 1 SVG by cardiac CT 2007   . Ischemic cardiomyopathy     Prior inferior infarct EF 35% by ECHO 07/13/11    . Depression   . S/P CABG (coronary artery bypass graft)     CABG 06/1998 [place], [place] with LIMA to LAD, SVG to OM, SVG to PD-PL.   OM graft occluded 10/12/2005 by CTA   . Sleep apnea   .  Hyperlipidemia   . GERD (gastroesophageal reflux disease)   . Ventricular tachycardia ([place]) 10/15    VT at 188 bpm- unstable requiring external defibrillation    Past Surgical History  Procedure Laterality Date  . Coronary artery bypass graft  1999  . Appendectomy    . Left heart catheterization with coronary/graft angiogram N/A 07/30/2014    Procedure: LEFT HEART CATHETERIZATION WITH Beatrix Fetters;  Surgeon: Jacolyn Reedy, MD;  Location: [facility] CATH LAB;  Service: Cardiovascular;  Laterality: N/A;  . Implantable cardioverter defibrillator implant N/A 07/31/2014    MDT Evalyn Casco DR ICD implanted by Dr Rayann Heman for secondary treatent of VT    Social History  Substance Use Topics  . Smoking status: Never Smoker   . Smokeless tobacco: Never Used  . Alcohol Use: 1.2 oz/week    2 Glasses of wine per week    Family History  Problem Relation Age of Onset  . Cerebral aneurysm Father   . Hepatitis Brother     Allergies  Allergen Reactions  . Carvedilol     Fatigue   . Escitalopram Oxalate     unknown  . Niacin And Related  [place]   . Spironolactone     gynecomastia    Medication list has been reviewed and updated.  Current Outpatient Prescriptions on File Prior to Visit  Medication Sig Dispense Refill  . ALPRAZolam (XANAX) 0.5 MG tablet Take 1/2 to 1 tablet by mouth daily as needed for anxiety    . Ascorbic Acid (VITAMIN C) 1000 MG tablet Take 1,000 mg by mouth daily.    Marland Kitchen aspirin EC 81 MG tablet Take 81 mg by mouth daily.    Marland Kitchen atorvastatin (LIPITOR) 80 MG tablet Take 80 mg by mouth daily.    . cetirizine (ZYRTEC) 10 MG tablet Take 10 mg by mouth daily.    . clopidogrel (PLAVIX) 75 MG tablet Take 75 mg by mouth daily.    . Coenzyme Q10 50 MG CAPS Take 1 capsule by mouth daily.    . digoxin (LANOXIN) 0.125 MG tablet Take 0.125 mg by mouth daily.    . diphenoxylate-atropine (LOMOTIL) 2.5-0.025 MG per tablet Take 1 tablet by mouth 4 (four) times daily as needed.  diarrhea    . eplerenone (INSPRA) 25 MG tablet Take 25 mg by mouth daily.    . fenofibrate 160 MG tablet Take 160 mg by mouth daily.    . folic acid (FOLVITE) Q000111Q MCG tablet Take 400 mcg by mouth daily.    . metoprolol succinate (TOPROL-XL) 100 MG 24 hr tablet Take 100 mg ion the morning and 50mg (1/2 tablet)at night 135 tablet 3  . Multiple Vitamin (MULTIVITAMIN WITH MINERALS) TABS tablet Take 1 tablet by mouth daily.    . Omega-3 Fatty Acids (FISH OIL) 1000 MG CAPS Take 1 capsule by mouth daily.    Marland Kitchen omeprazole (PRILOSEC) 20 MG capsule Take 20 mg by mouth daily.    . ramipril (ALTACE) 10 MG capsule Take 10 mg by mouth daily.    . Vitamin E 400 UNITS TABS Take 800 Units by mouth daily.     No current facility-administered medications on file prior to visit.    Review of Systems:  As per HPI- otherwise negative. He took lexpro years ago and is not sure why it is listed as an allergy- he thinks he just did not tolerate this well but was not actually allergic.  States that he has used amox in the last year or so wihout any problem    Physical Examination: Filed Vitals:   10/19/15 1413  BP: 116/70  Pulse: 57  Temp: 97.6 F (36.4 C)  Resp: 16   Filed Vitals:   10/19/15 1413  Height: 5\' 8"  (1.727 m)  Weight: 200 lb 6.4 oz (90.901 kg)   Body mass index is 30.48 kg/(m^2). Ideal Body Weight: Weight in (lb) to have BMI = 25: 164.1  GEN: WDWN, NAD, Non-toxic, A & O x 3, looks well, overweight HEENT: Atraumatic, Normocephalic. Neck supple. No masses, No LAD.  Bilateral TM are inflamed and red, oropharynx normal.  PEERL,EOMI.   Ears and Nose: No external deformity. CV: RRR, No M/G/R. No JVD. No thrill. No extra heart sounds. PULM: CTA B, no wheezes, crackles, rhonchi. No retractions. No resp. distress. No accessory muscle use. EXTR: No c/c/e NEURO Normal gait.  PSYCH: Normally interactive. Conversant. Not depressed or anxious appearing.  Calm demeanor.    Assessment and Plan: Acute  nonsuppurative otitis media of both ears - Plan: amoxicillin (AMOXIL) 500 MG capsule  Ear pain, bilateral  Treat for OM with amoxicillin There is a paraben cross reac between lexapro and amox- however  he reports no true allergy to lexapro and he has taken amox in the last year without a problem.  He will let me know if not better in the next few days- Sooner if worse.     Signed Lamar Blinks, MD

## 2015-10-19 NOTE — Patient Instructions (Signed)
We are going to treat your ear infection with amoxicillin.  Take this as directed for the next 10 days. If your ears do not feel better please let us know!

## 2015-10-26 ENCOUNTER — Telehealth: Payer: Self-pay

## 2015-10-26 DIAGNOSIS — R0981 Nasal congestion: Secondary | ICD-10-CM

## 2015-10-26 MED ORDER — FLUTICASONE PROPIONATE 50 MCG/ACT NA SUSP: 2.0000 | Freq: Every day | NASAL | Status: DC

## 2015-10-26 NOTE — Telephone Encounter (Signed)
Patient is returning a missed phone call from Dr. Lorelei Pont. Patient can be reached on his home phone.

## 2015-10-26 NOTE — Telephone Encounter (Signed)
Paul Mclean, MD at 10/26/2015 2:59 PM     Status: Signed       Expand All Collapse All   Called and LMOM as he did not answer. I am sorry that he is still stuffy. We could try prednisone but I would prefer to be very cautious with this given his heart problems and other medications.  Would suggest that we try OTC afrin for a few days for his nasal congestion. If already doing this and not helpful can try an rx nasal spray that I will send to his pharmacy. Let me know if these measures are not helpful

## 2015-10-26 NOTE — Telephone Encounter (Signed)
Pt states he was given AMOXIL and it isn't helping at all, states his ears are still stopped up and he head is stuffy. Please call [place]

## 2015-10-26 NOTE — Telephone Encounter (Signed)
Called and LMOM as he did not answer.  I am sorry that he is still stuffy. We could try prednisone but I would prefer to be very cautious with this given his heart problems and other medications.   Would suggest that we try OTC afrin for a few days for his nasal congestion.  If already doing this and not helpful can try an rx nasal spray that I will send to his pharmacy.  Let me know if these measures are not helpful

## 2015-10-28 MED ORDER — CEFDINIR 300 MG PO CAPS: 300.0000 mg | ORAL_CAPSULE | Freq: Two times a day (BID) | ORAL | Status: DC

## 2015-10-28 NOTE — Telephone Encounter (Signed)
Call the house phone.

## 2015-10-28 NOTE — Telephone Encounter (Signed)
Called him back- unfortunately did not reach and LMOM again Has he tried afrin OTC?  If not would try this. I will change his abx to [facility] for 5 days However if he is not getting better he should consider coming in to see [place] again as generally this will get better with what we have done so far

## 2015-10-28 NOTE — Telephone Encounter (Signed)
Spoke with pt, he already has a Rx for [facility] and he has been taking that. He states nothing is helping and he needs something. His ears still hurt and would even try another antibiotic.

## 2015-10-28 NOTE — Telephone Encounter (Signed)
[place] at 10/28/2015 3:25 PM     Status: Signed       Expand All Collapse All   Spoke with pt, he already has a Rx for Flonase and he has been taking that. He states nothing is helping and he needs something. His ears still hurt and would even try another antibiotic.

## 2015-10-28 NOTE — Addendum Note (Signed)
Addended by: Lamar Blinks C on: 10/28/2015 05:43 PM   Modules accepted: Orders

## 2015-11-03 ENCOUNTER — Encounter (HOSPITAL_COMMUNITY): Payer: PPO

## 2015-11-04 ENCOUNTER — Ambulatory Visit: Payer: PPO | Admitting: Internal Medicine

## 2015-11-10 ENCOUNTER — Encounter (HOSPITAL_COMMUNITY)
Admission: RE | Admit: 2015-11-10 | Discharge: 2015-11-10 | Disposition: A | Discharge status: Home / Self Care | Payer: PPO | Source: Ambulatory Visit | Attending: Cardiology | Admitting: Cardiology

## 2015-11-10 VITALS — BP 100/58 | HR 52 | Ht 68.0 in | Wt 201.9 lb

## 2015-11-10 DIAGNOSIS — I208 Other forms of angina pectoris: Secondary | ICD-10-CM | POA: Diagnosis not present

## 2015-11-10 DIAGNOSIS — I5023 Acute on chronic systolic (congestive) heart failure: Secondary | ICD-10-CM

## 2015-11-10 DIAGNOSIS — Z951 Presence of aortocoronary bypass graft: Secondary | ICD-10-CM | POA: Diagnosis not present

## 2015-11-10 DIAGNOSIS — I2510 Atherosclerotic heart disease of native coronary artery without angina pectoris: Secondary | ICD-10-CM | POA: Diagnosis not present

## 2015-11-10 NOTE — Progress Notes (Signed)
Patient arrived for 1st visit/orientation/education at 1430. Patient was referred to CR by Dr. Wynonia Lawman due to Stable Angina/CHF.  During orientation advised patient on arrival and appointment times what to wear, what to do before, during and after exercise. Reviewed attendance and class policy. Talked about inclement weather and class consultation policy. Pt is scheduled to return Cardiac Rehab on 11/16/15 at 11:00. Pt was advised to come to class 5 minutes before class starts. He was also given instructions on meeting with the dietician and attending the Family Structure classes. Pt is eager to get started. Patient was able to complete 6 minute walk test. He said he experienced some chest tightness. Chest discomfort was a 2 on pain scale. It subsided immediately after the test was done. Patient was measured for the equipment. Discussed equipment safety with patient. Took patient pre-anthropometric measurements. Patient scored 4 on PHQ-2 and 11 on PHQ-9. Patient stated he is depressed and experiencing anxiety. He feels it is all due to his recent heart event. He said he will seek a referral from his PCP if he needs to see a counselor. Patient finished visit at 1648.

## 2015-11-10 NOTE — Progress Notes (Signed)
Cardiac/Pulmonary Rehab Medication Review by a Pharmacist  Does the patient  feel that his/her medications are working for him/her?  yes  Has the patient been experiencing any side effects to the medications prescribed?  He feels he may have some light headedness if he stands up too fast.  Does the patient measure his/her own blood pressure or blood glucose at home?  no   Does the patient have any problems obtaining medications due to transportation or finances?   no  Understanding of regimen: good Understanding of indications: good Potential of compliance: excellent  Mr Paul [name] feels his medications are working for him.  He had some of his medications changed to alternatives as they were not covered by insurance. He has good complicance with his medications    Beverlee Nims 11/10/2015 3:18 PM

## 2015-11-10 NOTE — Patient Instructions (Signed)
Pt has finished orientation and is scheduled to return to CR on 11/16/15 at 11:00. Pt has been instructed to arrive to class 15 minutes early for scheduled class. Pt has been instructed to wear comfortable clothing and shoes with rubber soles. Pt has been told to take their medications 1 hour prior to coming to class.  If the patient is not going to attend class, he/she has been instructed to call.

## 2015-11-13 ENCOUNTER — Inpatient Hospital Stay (HOSPITAL_COMMUNITY)
Admission: EM | Admit: 2015-11-13 | Discharge: 2015-11-16 | DRG: 309 | Disposition: A | Discharge status: Home / Self Care | Payer: PPO | Attending: Cardiology | Admitting: Cardiology

## 2015-11-13 ENCOUNTER — Encounter (HOSPITAL_COMMUNITY): Payer: Self-pay

## 2015-11-13 DIAGNOSIS — I472 Ventricular tachycardia, unspecified: Secondary | ICD-10-CM

## 2015-11-13 DIAGNOSIS — I25810 Atherosclerosis of coronary artery bypass graft(s) without angina pectoris: Secondary | ICD-10-CM | POA: Diagnosis not present

## 2015-11-13 DIAGNOSIS — Z7982 Long term (current) use of aspirin: Secondary | ICD-10-CM | POA: Diagnosis not present

## 2015-11-13 DIAGNOSIS — I499 Cardiac arrhythmia, unspecified: Secondary | ICD-10-CM | POA: Diagnosis not present

## 2015-11-13 DIAGNOSIS — N179 Acute kidney failure, unspecified: Secondary | ICD-10-CM | POA: Diagnosis present

## 2015-11-13 DIAGNOSIS — I252 Old myocardial infarction: Secondary | ICD-10-CM

## 2015-11-13 DIAGNOSIS — Z4502 Encounter for adjustment and management of automatic implantable cardiac defibrillator: Secondary | ICD-10-CM

## 2015-11-13 DIAGNOSIS — K219 Gastro-esophageal reflux disease without esophagitis: Secondary | ICD-10-CM | POA: Diagnosis not present

## 2015-11-13 DIAGNOSIS — R778 Other specified abnormalities of plasma proteins: Secondary | ICD-10-CM

## 2015-11-13 DIAGNOSIS — Z888 Allergy status to other drugs, medicaments and biological substances status: Secondary | ICD-10-CM | POA: Diagnosis not present

## 2015-11-13 DIAGNOSIS — N400 Enlarged prostate without lower urinary tract symptoms: Secondary | ICD-10-CM | POA: Diagnosis present

## 2015-11-13 DIAGNOSIS — R001 Bradycardia, unspecified: Secondary | ICD-10-CM | POA: Diagnosis not present

## 2015-11-13 DIAGNOSIS — R7989 Other specified abnormal findings of blood chemistry: Secondary | ICD-10-CM

## 2015-11-13 DIAGNOSIS — Z7902 Long term (current) use of antithrombotics/antiplatelets: Secondary | ICD-10-CM

## 2015-11-13 DIAGNOSIS — Z95810 Presence of automatic (implantable) cardiac defibrillator: Secondary | ICD-10-CM | POA: Diagnosis present

## 2015-11-13 DIAGNOSIS — Z0389 Encounter for observation for other suspected diseases and conditions ruled out: Secondary | ICD-10-CM | POA: Diagnosis not present

## 2015-11-13 DIAGNOSIS — T82198A Other mechanical complication of other cardiac electronic device, initial encounter: Secondary | ICD-10-CM | POA: Diagnosis not present

## 2015-11-13 DIAGNOSIS — R0600 Dyspnea, unspecified: Secondary | ICD-10-CM | POA: Diagnosis not present

## 2015-11-13 DIAGNOSIS — G4733 Obstructive sleep apnea (adult) (pediatric): Secondary | ICD-10-CM | POA: Diagnosis not present

## 2015-11-13 DIAGNOSIS — Z951 Presence of aortocoronary bypass graft: Secondary | ICD-10-CM

## 2015-11-13 DIAGNOSIS — F329 Major depressive disorder, single episode, unspecified: Secondary | ICD-10-CM | POA: Diagnosis present

## 2015-11-13 DIAGNOSIS — F32A Depression, unspecified: Secondary | ICD-10-CM | POA: Diagnosis present

## 2015-11-13 DIAGNOSIS — E785 Hyperlipidemia, unspecified: Secondary | ICD-10-CM | POA: Diagnosis present

## 2015-11-13 DIAGNOSIS — I255 Ischemic cardiomyopathy: Secondary | ICD-10-CM | POA: Diagnosis present

## 2015-11-13 DIAGNOSIS — R002 Palpitations: Secondary | ICD-10-CM | POA: Diagnosis not present

## 2015-11-13 DIAGNOSIS — I25110 Atherosclerotic heart disease of native coronary artery with unstable angina pectoris: Secondary | ICD-10-CM | POA: Diagnosis not present

## 2015-11-13 DIAGNOSIS — G4730 Sleep apnea, unspecified: Secondary | ICD-10-CM | POA: Diagnosis present

## 2015-11-13 DIAGNOSIS — I2510 Atherosclerotic heart disease of native coronary artery without angina pectoris: Secondary | ICD-10-CM | POA: Diagnosis present

## 2015-11-13 DIAGNOSIS — R748 Abnormal levels of other serum enzymes: Secondary | ICD-10-CM | POA: Diagnosis not present

## 2015-11-13 NOTE — ED Notes (Signed)
Pt states his defib fired x 2 tonight, states he felt his heart racing and then it fired.  Pt denies cp at this time.

## 2015-11-14 ENCOUNTER — Emergency Department (HOSPITAL_COMMUNITY): Payer: PPO

## 2015-11-14 DIAGNOSIS — I25810 Atherosclerosis of coronary artery bypass graft(s) without angina pectoris: Secondary | ICD-10-CM | POA: Diagnosis not present

## 2015-11-14 DIAGNOSIS — Z0389 Encounter for observation for other suspected diseases and conditions ruled out: Secondary | ICD-10-CM

## 2015-11-14 DIAGNOSIS — Z4502 Encounter for adjustment and management of automatic implantable cardiac defibrillator: Secondary | ICD-10-CM

## 2015-11-14 DIAGNOSIS — Z888 Allergy status to other drugs, medicaments and biological substances status: Secondary | ICD-10-CM | POA: Diagnosis not present

## 2015-11-14 DIAGNOSIS — N179 Acute kidney failure, unspecified: Secondary | ICD-10-CM | POA: Diagnosis not present

## 2015-11-14 DIAGNOSIS — Z951 Presence of aortocoronary bypass graft: Secondary | ICD-10-CM | POA: Diagnosis not present

## 2015-11-14 DIAGNOSIS — E785 Hyperlipidemia, unspecified: Secondary | ICD-10-CM | POA: Diagnosis not present

## 2015-11-14 DIAGNOSIS — F329 Major depressive disorder, single episode, unspecified: Secondary | ICD-10-CM | POA: Diagnosis not present

## 2015-11-14 DIAGNOSIS — Z7902 Long term (current) use of antithrombotics/antiplatelets: Secondary | ICD-10-CM | POA: Diagnosis not present

## 2015-11-14 DIAGNOSIS — N400 Enlarged prostate without lower urinary tract symptoms: Secondary | ICD-10-CM | POA: Diagnosis not present

## 2015-11-14 DIAGNOSIS — Z7982 Long term (current) use of aspirin: Secondary | ICD-10-CM | POA: Diagnosis not present

## 2015-11-14 DIAGNOSIS — I472 Ventricular tachycardia: Principal | ICD-10-CM

## 2015-11-14 DIAGNOSIS — I252 Old myocardial infarction: Secondary | ICD-10-CM | POA: Diagnosis not present

## 2015-11-14 DIAGNOSIS — R001 Bradycardia, unspecified: Secondary | ICD-10-CM | POA: Diagnosis not present

## 2015-11-14 DIAGNOSIS — I25110 Atherosclerotic heart disease of native coronary artery with unstable angina pectoris: Secondary | ICD-10-CM | POA: Diagnosis not present

## 2015-11-14 DIAGNOSIS — Z95810 Presence of automatic (implantable) cardiac defibrillator: Secondary | ICD-10-CM | POA: Diagnosis not present

## 2015-11-14 DIAGNOSIS — K219 Gastro-esophageal reflux disease without esophagitis: Secondary | ICD-10-CM | POA: Diagnosis not present

## 2015-11-14 DIAGNOSIS — G4733 Obstructive sleep apnea (adult) (pediatric): Secondary | ICD-10-CM | POA: Diagnosis not present

## 2015-11-14 DIAGNOSIS — R002 Palpitations: Secondary | ICD-10-CM | POA: Diagnosis not present

## 2015-11-14 DIAGNOSIS — I255 Ischemic cardiomyopathy: Secondary | ICD-10-CM | POA: Diagnosis not present

## 2015-11-14 LAB — CBC WITH DIFFERENTIAL/PLATELET
Basophils Absolute: 0 10*3/uL (ref 0.0–0.1)
Basophils Relative: 0 %
EOS ABS: 0.2 10*3/uL (ref 0.0–0.7)
EOS PCT: 4 %
HCT: 44.2 % (ref 39.0–52.0)
Hemoglobin: 15.3 g/dL (ref 13.0–17.0)
LYMPHS ABS: 1.8 10*3/uL (ref 0.7–4.0)
Lymphocytes Relative: 33 %
MCH: 32.5 pg (ref 26.0–34.0)
MCHC: 34.6 g/dL (ref 30.0–36.0)
MCV: 93.8 fL (ref 78.0–100.0)
MONO ABS: 0.6 10*3/uL (ref 0.1–1.0)
Monocytes Relative: 11 %
Neutro Abs: 2.9 10*3/uL (ref 1.7–7.7)
Neutrophils Relative %: 52 %
PLATELETS: 163 10*3/uL (ref 150–400)
RBC: 4.71 MIL/uL (ref 4.22–5.81)
RDW: 12.9 % (ref 11.5–15.5)
WBC: 5.4 10*3/uL (ref 4.0–10.5)

## 2015-11-14 LAB — CREATININE, URINE, RANDOM: CREATININE, URINE: 47.31 mg/dL

## 2015-11-14 LAB — COMPREHENSIVE METABOLIC PANEL
ALBUMIN: 3.6 g/dL (ref 3.5–5.0)
ALT: 24 U/L (ref 17–63)
ANION GAP: 7 (ref 5–15)
AST: 30 U/L (ref 15–41)
Alkaline Phosphatase: 55 U/L (ref 38–126)
BUN: 6 mg/dL (ref 6–20)
CO2: 26 mmol/L (ref 22–32)
Calcium: 9 mg/dL (ref 8.9–10.3)
Chloride: 108 mmol/L (ref 101–111)
Creatinine, Ser: 1.23 mg/dL (ref 0.61–1.24)
GFR calc Af Amer: >60 mL/min (ref >60)
GFR calc non Af Amer: >60 mL/min (ref >60)
GLUCOSE: 107 mg/dL — AB (ref 65–99)
POTASSIUM: 4.4 mmol/L (ref 3.5–5.1)
SODIUM: 141 mmol/L (ref 135–145)
TOTAL PROTEIN: 6.2 g/dL — AB (ref 6.5–8.1)
Total Bilirubin: 0.8 mg/dL (ref 0.3–1.2)

## 2015-11-14 LAB — BASIC METABOLIC PANEL
ANION GAP: 10 (ref 5–15)
BUN: 10 mg/dL (ref 6–20)
CHLORIDE: 106 mmol/L (ref 101–111)
CO2: 25 mmol/L (ref 22–32)
Calcium: 9.5 mg/dL (ref 8.9–10.3)
Creatinine, Ser: 1.26 mg/dL — ABNORMAL HIGH (ref 0.61–1.24)
GFR calc Af Amer: >60 mL/min (ref >60)
GFR, EST NON AFRICAN AMERICAN: 59 mL/min — AB (ref >60)
GLUCOSE: 139 mg/dL — AB (ref 65–99)
POTASSIUM: 3.6 mmol/L (ref 3.5–5.1)
Sodium: 141 mmol/L (ref 135–145)

## 2015-11-14 LAB — BRAIN NATRIURETIC PEPTIDE: B NATRIURETIC PEPTIDE 5: 179 pg/mL — AB (ref 0.0–100.0)

## 2015-11-14 LAB — CBC
HCT: 43.4 % (ref 39.0–52.0)
Hemoglobin: 14.9 g/dL (ref 13.0–17.0)
MCH: 32.3 pg (ref 26.0–34.0)
MCHC: 34.3 g/dL (ref 30.0–36.0)
MCV: 93.9 fL (ref 78.0–100.0)
PLATELETS: 144 10*3/uL — AB (ref 150–400)
RBC: 4.62 MIL/uL (ref 4.22–5.81)
RDW: 13.1 % (ref 11.5–15.5)
WBC: 5.9 10*3/uL (ref 4.0–10.5)

## 2015-11-14 LAB — MRSA PCR SCREENING: MRSA by PCR: NEGATIVE

## 2015-11-14 LAB — TSH: TSH: 1.676 u[IU]/mL (ref 0.350–4.500)

## 2015-11-14 LAB — MAGNESIUM: MAGNESIUM: 1.5 mg/dL — AB (ref 1.7–2.4)

## 2015-11-14 LAB — NA AND K (SODIUM & POTASSIUM), RAND UR
POTASSIUM UR: 6 mmol/L
Sodium, Ur: 92 mmol/L

## 2015-11-14 LAB — DIGOXIN LEVEL: Digoxin Level: 0.6 ng/mL — ABNORMAL LOW (ref 0.8–2.0)

## 2015-11-14 LAB — TROPONIN I: Troponin I: 0.05 ng/mL — ABNORMAL HIGH (ref <0.031)

## 2015-11-14 MED ORDER — FOLIC ACID 1 MG PO TABS
1.0000 mg | ORAL_TABLET | Freq: Every day | ORAL | Status: DC
  Administered 2015-11-14 – 2015-11-16 (×3): 1 mg via ORAL
  Filled 2015-11-14 (×3): qty 1

## 2015-11-14 MED ORDER — SODIUM CHLORIDE 0.9 % IJ SOLN
3.0000 mL | Freq: Two times a day (BID) | INTRAMUSCULAR | Status: DC
  Administered 2015-11-14 – 2015-11-15 (×3): 3 mL via INTRAVENOUS

## 2015-11-14 MED ORDER — ASPIRIN EC 81 MG PO TBEC
81.0000 mg | DELAYED_RELEASE_TABLET | Freq: Every day | ORAL | Status: DC
  Administered 2015-11-14 – 2015-11-16 (×3): 81 mg via ORAL
  Filled 2015-11-14 (×3): qty 1

## 2015-11-14 MED ORDER — SORBITOL 70 % SOLN: 30.0000 mL | Freq: Every day | Status: DC | PRN

## 2015-11-14 MED ORDER — HYDROCODONE-ACETAMINOPHEN 5-325 MG PO TABS: 1.0000 | ORAL_TABLET | ORAL | Status: DC | PRN

## 2015-11-14 MED ORDER — METOPROLOL SUCCINATE ER 100 MG PO TB24
100.0000 mg | ORAL_TABLET | Freq: Two times a day (BID) | ORAL | Status: DC
  Administered 2015-11-14 – 2015-11-16 (×4): 100 mg via ORAL
  Filled 2015-11-14 (×5): qty 1

## 2015-11-14 MED ORDER — ATORVASTATIN CALCIUM 80 MG PO TABS
80.0000 mg | ORAL_TABLET | Freq: Every day | ORAL | Status: DC
  Administered 2015-11-14 – 2015-11-16 (×3): 80 mg via ORAL
  Filled 2015-11-14 (×3): qty 1

## 2015-11-14 MED ORDER — HEPARIN SODIUM (PORCINE) 5000 UNIT/ML IJ SOLN
5000.0000 [IU] | Freq: Three times a day (TID) | INTRAMUSCULAR | Status: DC
  Administered 2015-11-14 – 2015-11-16 (×6): 5000 [IU] via SUBCUTANEOUS
  Filled 2015-11-14 (×5): qty 1

## 2015-11-14 MED ORDER — AMIODARONE HCL IN DEXTROSE 360-4.14 MG/200ML-% IV SOLN
60.0000 mg/h | INTRAVENOUS | Status: AC
  Administered 2015-11-14 (×2): 60 mg/h via INTRAVENOUS
  Filled 2015-11-14 (×3): qty 200

## 2015-11-14 MED ORDER — CLOPIDOGREL BISULFATE 75 MG PO TABS
75.0000 mg | ORAL_TABLET | Freq: Every day | ORAL | Status: DC
  Administered 2015-11-14 – 2015-11-16 (×3): 75 mg via ORAL
  Filled 2015-11-14 (×3): qty 1

## 2015-11-14 MED ORDER — SODIUM CHLORIDE 0.9 % IV SOLN: INTRAVENOUS | Status: DC

## 2015-11-14 MED ORDER — MAGNESIUM SULFATE 2 GM/50ML IV SOLN
2.0000 g | Freq: Once | INTRAVENOUS | Status: AC
  Administered 2015-11-14: 2 g via INTRAVENOUS
  Filled 2015-11-14: qty 50

## 2015-11-14 MED ORDER — ALPRAZOLAM 0.25 MG PO TABS: 0.2500 mg | ORAL_TABLET | Freq: Two times a day (BID) | ORAL | Status: DC | PRN

## 2015-11-14 MED ORDER — ZOLPIDEM TARTRATE 5 MG PO TABS: 5.0000 mg | ORAL_TABLET | Freq: Every evening | ORAL | Status: DC | PRN

## 2015-11-14 MED ORDER — AMIODARONE LOAD VIA INFUSION
150.0000 mg | Freq: Once | INTRAVENOUS | Status: AC
  Administered 2015-11-14: 150 mg via INTRAVENOUS
  Filled 2015-11-14: qty 83.34

## 2015-11-14 MED ORDER — DIGOXIN 125 MCG PO TABS
0.1250 mg | ORAL_TABLET | Freq: Every day | ORAL | Status: DC
  Administered 2015-11-14 – 2015-11-15 (×2): 0.125 mg via ORAL
  Filled 2015-11-14 (×3): qty 1

## 2015-11-14 MED ORDER — RAMIPRIL 10 MG PO CAPS
10.0000 mg | ORAL_CAPSULE | Freq: Every day | ORAL | Status: DC
  Administered 2015-11-14 – 2015-11-16 (×3): 10 mg via ORAL
  Filled 2015-11-14 (×3): qty 1

## 2015-11-14 MED ORDER — AMIODARONE HCL IN DEXTROSE 360-4.14 MG/200ML-% IV SOLN
30.0000 mg/h | INTRAVENOUS | Status: DC
  Administered 2015-11-14: 30 mg/h via INTRAVENOUS

## 2015-11-14 MED ORDER — SODIUM CHLORIDE 0.9 % IV SOLN
INTRAVENOUS | Status: DC
  Administered 2015-11-14 (×3): via INTRAVENOUS

## 2015-11-14 MED ORDER — FENOFIBRATE 160 MG PO TABS
160.0000 mg | ORAL_TABLET | Freq: Every day | ORAL | Status: DC
  Administered 2015-11-14 – 2015-11-16 (×3): 160 mg via ORAL
  Filled 2015-11-14 (×3): qty 1

## 2015-11-14 NOTE — ED Provider Notes (Signed)
CSN: [id number]     Arrival date & time 11/13/15  2321 History   First MD Initiated Contact with Patient 11/13/15 2359     Chief Complaint  Patient presents with  . Defibrillator fired       HPI  Pt was seen at [place]. Per pt and his wife, c/o sudden onset and resolution of 2 episodes of "my defib fired" that occurred PTA. Pt states he felt his "heart racing" for approximately 1 hour before the defib fired. Pt states he was "getting lightheaded" and "then the defib went off once, followed by another time." Pt states he "feels fine now." Pt states "this is the first time his defib fired since I got it put in." States he called Cards Dr. Bronson Ing while he was en route to the ED. Denies CP, no SOB/cough, no abd pain, no N/V/D, no back pain.   Cards: Dr. Wynonia Lawman, Dr. Rayann Heman Past Medical History  Diagnosis Date  . CAD (coronary artery disease)     widely patent LAD LIMA graft, widely patent PDA PLR SVG, occluded Diag 1 OM 1 SVG by cardiac CT 2007   . Ischemic cardiomyopathy     Prior inferior infarct EF 35% by ECHO 07/13/11    . Depression   . S/P CABG (coronary artery bypass graft)     CABG 06/1998 [place], [place] with LIMA to LAD, SVG to OM, SVG to PD-PL.   OM graft occluded 10/12/2005 by CTA   . Sleep apnea   . Hyperlipidemia   . GERD (gastroesophageal reflux disease)   . Ventricular tachycardia ([place]) 10/15    VT at 188 bpm- unstable requiring external defibrillation  . IBS (irritable bowel syndrome)   . BPH (benign prostatic hyperplasia)    Past Surgical History  Procedure Laterality Date  . Coronary artery bypass graft  1999  . Appendectomy    . Left heart catheterization with coronary/graft angiogram N/A 07/30/2014    Procedure: LEFT HEART CATHETERIZATION WITH Beatrix Fetters;  Surgeon: Jacolyn Reedy, MD;  Location: [facility] CATH LAB;  Service: Cardiovascular;  Laterality: N/A;  . Implantable cardioverter defibrillator implant N/A 07/31/2014    MDT Gwyneth Revels XT DR ICD implanted by Dr  Rayann Heman for secondary treatent of VT   Family History  Problem Relation Age of Onset  . Cerebral aneurysm Father   . Hepatitis Brother    Social History  Substance Use Topics  . Smoking status: Never Smoker   . Smokeless tobacco: Never Used  . Alcohol Use: 1.2 oz/week    2 Glasses of wine per week    Review of Systems ROS: Statement: All systems negative except as marked or noted in the HPI; Constitutional: Negative for fever and chills. ; ; Eyes: Negative for eye pain, redness and discharge. ; ; ENMT: Negative for ear pain, hoarseness, nasal congestion, sinus pressure and sore throat. ; ; Cardiovascular: +palpitations. Negative for chest pain, diaphoresis, dyspnea and peripheral edema. ; ; Respiratory: Negative for cough, wheezing and stridor. ; ; Gastrointestinal: Negative for nausea, vomiting, diarrhea, abdominal pain, blood in stool, hematemesis, jaundice and rectal bleeding. . ; ; Genitourinary: Negative for dysuria, flank pain and hematuria. ; ; Musculoskeletal: Negative for back pain and neck pain. Negative for swelling and trauma.; ; Skin: Negative for pruritus, rash, abrasions, blisters, bruising and skin lesion.; ; Neuro: +lightheadedness. Negative for headache and neck stiffness. Negative for weakness, altered level of consciousness , altered mental status, extremity weakness, paresthesias, involuntary movement, seizure and syncope.  Allergies  Carvedilol; Escitalopram oxalate; Niacin and related; and Spironolactone  Home Medications   Prior to Admission medications   Medication Sig Start Date End Date Taking? Authorizing Provider  ALPRAZolam Duanne Moron) 0.5 MG tablet Take 1/2 to 1 tablet by mouth daily as needed for anxiety 10/14/14   Historical Provider, MD  amoxicillin (AMOXIL) 500 MG capsule Take 2 capsules (1,000 mg total) by mouth 2 (two) times daily. 10/19/15   Darreld Mclean, MD  Ascorbic Acid (VITAMIN C) 1000 MG tablet Take 1,000 mg by mouth daily.    Historical  Provider, MD  aspirin EC 81 MG tablet Take 81 mg by mouth daily.    Historical Provider, MD  atorvastatin (LIPITOR) 80 MG tablet Take 80 mg by mouth daily.    Historical Provider, MD  cefdinir (OMNICEF) 300 MG capsule Take 1 capsule (300 mg total) by mouth 2 (two) times daily. 10/28/15   Gay Filler Copland, MD  cetirizine (ZYRTEC) 10 MG tablet Take 10 mg by mouth daily.    Historical Provider, MD  clopidogrel (PLAVIX) 75 MG tablet Take 75 mg by mouth daily.    Historical Provider, MD  Coenzyme Q10 50 MG CAPS Take 1 capsule by mouth daily.    Historical Provider, MD  digoxin (LANOXIN) 0.125 MG tablet Take 0.125 mg by mouth daily.    Historical Provider, MD  diphenoxylate-atropine (LOMOTIL) 2.5-0.025 MG tablet Take by mouth 4 (four) times daily as needed for diarrhea or loose stools.    Historical Provider, MD  eplerenone (INSPRA) 25 MG tablet Take 25 mg by mouth daily.    Historical Provider, MD  fenofibrate 160 MG tablet Take 160 mg by mouth daily.    Historical Provider, MD  fluticasone (FLONASE) 50 MCG/ACT nasal spray Place 2 sprays into both nostrils daily. 10/26/15   Gay Filler Copland, MD  folic acid (FOLVITE) Q000111Q MCG tablet Take 800 mcg by mouth daily.     Historical Provider, MD  hyoscyamine (LEVSIN, ANASPAZ) 0.125 MG tablet Take 0.125 mg by mouth every 4 (four) hours as needed for cramping.    Historical Provider, MD  metoprolol succinate (TOPROL-XL) 100 MG 24 hr tablet Take 100 mg ion the morning and 50mg (1/2 tablet)at night 08/31/15   Thompson Grayer, MD  Multiple Vitamin (MULTIVITAMIN WITH MINERALS) TABS tablet Take 1 tablet by mouth daily.    Historical Provider, MD  Omega-3 Fatty Acids (FISH OIL) 1000 MG CAPS Take 1 capsule by mouth daily.    Historical Provider, MD  omeprazole (PRILOSEC) 20 MG capsule Take 20 mg by mouth 2 (two) times daily before a meal.  10/14/14 10/19/15  Historical Provider, MD  ramipril (ALTACE) 10 MG capsule Take 10 mg by mouth daily.    Historical Provider, MD  Vitamin  E 400 UNITS TABS Take 800 Units by mouth daily.    Historical Provider, MD   BP 115/68 mmHg  Pulse 64  Temp(Src) 98.3 F (36.8 C) (Oral)  Resp 18  Ht 5\' 8"  (1.727 m)  Wt 200 lb (90.719 kg)  BMI 30.42 kg/m2  SpO2 98% Physical Exam  0015: Physical examination:  Nursing notes reviewed; Vital signs and O2 SAT reviewed;  Constitutional: Well developed, Well nourished, Well hydrated, In no acute distress; Head:  Normocephalic, atraumatic; Eyes: EOMI, PERRL, No scleral icterus; ENMT: Mouth and pharynx normal, Mucous membranes moist; Neck: Supple, Full range of motion, No lymphadenopathy; Cardiovascular: Regular rate and rhythm, No gallop; Respiratory: Breath sounds clear & equal bilaterally, No wheezes.  Speaking full sentences  with ease, Normal respiratory effort/excursion; Chest: Nontender, Movement normal; Abdomen: Soft, Nontender, Nondistended, Normal bowel sounds; Genitourinary: No CVA tenderness; Extremities: Pulses normal, No tenderness, No edema, No calf edema or asymmetry.; Neuro: AA&Ox3, Major CN grossly intact.  Speech clear. No gross focal motor or sensory deficits in extremities.; Skin: Color normal, Warm, Dry.   ED Course  Procedures (including critical care time) Labs Review  Imaging Review  I have personally reviewed and evaluated these images and lab results as part of my medical decision-making.   EKG Interpretation   Date/Time:  Friday November 13 2015 23:35:18 EST Ventricular Rate:  65 PR Interval:  137 QRS Duration: 121 QT Interval:  381 QTC Calculation: 396 R Axis:   -10 Text Interpretation:  Sinus rhythm Left bundle branch block No old tracing  to compare Confirmed by [facility]  MD, Nunzio Cory [id number]) on 11/14/2015  12:31:07 AM      MDM  MDM Reviewed: previous chart, nursing note and vitals Reviewed previous: labs Interpretation: labs, ECG and x-ray      Results for orders placed or performed during the hospital encounter of [id number]  Basic metabolic panel   Result Value Ref Range   Sodium 141 135 - 145 mmol/L   Potassium 3.6 3.5 - 5.1 mmol/L   Chloride 106 101 - 111 mmol/L   CO2 25 22 - 32 mmol/L   Glucose, Bld 139 (H) 65 - 99 mg/dL   BUN 10 6 - 20 mg/dL   Creatinine, Ser 1.26 (H) 0.61 - 1.24 mg/dL   Calcium 9.5 8.9 - 10.3 mg/dL   GFR calc non Af Amer 59 (L) >60 mL/min   GFR calc Af Amer >60 >60 mL/min   Anion gap 10 5 - 15  Troponin I  Result Value Ref Range   Troponin I 0.05 (H) <0.031 ng/mL  Brain natriuretic peptide  Result Value Ref Range   B Natriuretic Peptide 179.0 (H) 0.0 - 100.0 pg/mL  CBC with Differential  Result Value Ref Range   WBC 5.4 4.0 - 10.5 K/uL   RBC 4.71 4.22 - 5.81 MIL/uL   Hemoglobin 15.3 13.0 - 17.0 g/dL   HCT 44.2 39.0 - 52.0 %   MCV 93.8 78.0 - 100.0 fL   MCH 32.5 26.0 - 34.0 pg   MCHC 34.6 30.0 - 36.0 g/dL   RDW 12.9 11.5 - 15.5 %   Platelets 163 150 - 400 K/uL   Neutrophils Relative % 52 %   Neutro Abs 2.9 1.7 - 7.7 K/uL   Lymphocytes Relative 33 %   Lymphs Abs 1.8 0.7 - 4.0 K/uL   Monocytes Relative 11 %   Monocytes Absolute 0.6 0.1 - 1.0 K/uL   Eosinophils Relative 4 %   Eosinophils Absolute 0.2 0.0 - 0.7 K/uL   Basophils Relative 0 %   Basophils Absolute 0.0 0.0 - 0.1 K/uL  Magnesium  Result Value Ref Range   Magnesium 1.5 (L) 1.7 - 2.4 mg/dL  Digoxin level  Result Value Ref Range   Digoxin Level 0.6 (L) 0.8 - 2.0 ng/mL   Dg Chest 2 View 11/14/2015  CLINICAL DATA:  65 year old male with palpitation. EXAM: CHEST  2 VIEW COMPARISON:  None. FINDINGS: Two views of the chest demonstrate mild left basilar densities, likely atelectatic changes. Pneumonia is less likely. No focal consolidation, pleural effusion, or pneumothorax. Mild cardiomegaly. Median sternotomy wires and left pectoral AICD device. IMPRESSION: Left lung base subsegmental atelectasis.  Pneumonia is less likely. Cardiomegaly. Electronically Signed   By:  Anner Crete M.D.   On: 11/14/2015 01:23    0210:  Defib interrogated,  report received. Pt remains NSR rates 50-60's on monitor while in the ED. Denies CP/palpitations. Will replete magnesium IV. Pt states he is taking his digoxin regularly. BUN/Cr mildly elevated; will dose judicious IVF.  T/C to [facility] Cards Dr. Eula Fried, case discussed, including:  HPI, pertinent PM/SHx, VS/PE, dx testing, ED course and treatment:  Agreeable to accept transfer, requests to write temporary orders, obtain CCU bed to his service.     Francine Graven, DO 11/17/15 1330

## 2015-11-14 NOTE — H&P (Signed)
Primary Care Physician: Chesley Noon, MD Referring Physician:  Admit Date: 11/13/2015  Reason for consultation:  Paul [name] is a 65 y.o. male with a h/o CAD s/p CABG (patent LIMA to LAD, occluded SVG to OM and patent SVG to PDA) after inferior MI in 1999 and then admission in 07/2014 for sharp chest pain when he was found to have a sustained VT @188  bpm (unstable) and ended up getting dual chamber Medtronic ICD for secondary prevention of symptomatic VT. Patient has also a known history of chronic angina (2/2 occluded small branch vessel dz), ischemic cardiomyopathy (last LVEF 30-35%), OSA, HLD, depression, obesity, GERD, HTN and anxiety.   He was doing well the day of admission when he started feeling palpitations.  These lasted approximately one hour.  He began to feel lightheaded and his ICD went off.  He had another episode of defibrillation.  He has had no chest pain, SOB, PND or orthopnea.  He was planning to start cardiac rehab on next week.  He says that he was able to do all of his activities without issue and was not having chest pain or palpitations prior to this episode.  Device interrogation showed 2 episodes of VT.  The first episode was thought to be SVT until the rate increaed and he received an ICD shock.  The second episode he received one shock which did not convert him (25J) and a second shock which did convert (35J).    Past Medical History  Diagnosis Date  . CAD (coronary artery disease)     widely patent LAD LIMA graft, widely patent PDA PLR SVG, occluded Diag 1 OM 1 SVG by cardiac CT 2007   . Ischemic cardiomyopathy     Prior inferior infarct EF 35% by ECHO 07/13/11    . Depression   . S/P CABG (coronary artery bypass graft)     CABG 06/1998 [place], [place] with LIMA to LAD, SVG to OM, SVG to PD-PL.   OM graft occluded 10/12/2005 by CTA   . Sleep apnea   . Hyperlipidemia   . GERD (gastroesophageal reflux disease)   . Ventricular tachycardia ([name]) 10/15    VT at 188  bpm- unstable requiring external defibrillation  . IBS (irritable bowel syndrome)   . BPH (benign prostatic hyperplasia)    Past Surgical History  Procedure Laterality Date  . Coronary artery bypass graft  1999  . Appendectomy    . Left heart catheterization with coronary/graft angiogram N/A 07/30/2014    Procedure: LEFT HEART CATHETERIZATION WITH Beatrix Fetters;  Surgeon: Jacolyn Reedy, MD;  Location: [facility];  Service: Cardiovascular;  Laterality: N/A;  . Implantable cardioverter defibrillator implant N/A 07/31/2014    MDT Gwyneth Revels XT DR ICD implanted by Dr Rayann Heman for secondary treatent of VT    . amiodarone  150 mg Intravenous Once  . aspirin EC  81 mg Oral Daily  . atorvastatin  80 mg Oral Daily  . clopidogrel  75 mg Oral Daily  . digoxin  0.125 mg Oral Daily  . fenofibrate  160 mg Oral Daily  . folic acid  1 mg Oral Daily  . heparin  5,000 Units Subcutaneous 3 times per day  . magnesium sulfate 1 - 4 g bolus IVPB  2 g Intravenous Once  . metoprolol succinate  100 mg Oral BID  . ramipril  10 mg Oral Daily  . sodium chloride  3 mL Intravenous Q12H   . sodium chloride 75 mL/hr at 11/14/15 0525  .  amiodarone     Followed by  . amiodarone      Allergies  Allergen Reactions  . Carvedilol     Fatigue   . Escitalopram Oxalate     unknown  . Niacin And Related     Flushing   . Spironolactone     gynecomastia    Social History   Social History  . Marital Status: Married    Spouse Name: N/A  . Number of Children: N/A  . Years of Education: N/A   Occupational History  . Not on file.   Social History Main Topics  . Smoking status: Never Smoker   . Smokeless tobacco: Never Used  . Alcohol Use: 1.2 oz/week    2 Glasses of wine per week  . Drug Use: No  . Sexual Activity: Not on file   Other Topics Concern  . Not on file   Social History Narrative    Family History  Problem Relation Age of Onset  . Cerebral aneurysm Father   . Hepatitis  Brother     ROS- All systems are reviewed and negative except as per the HPI above  Physical Exam: Telemetry: Filed Vitals:   11/14/15 0600 11/14/15 0615 11/14/15 0630 11/14/15 0700  BP: 114/61 117/65 129/80 130/75  Pulse: 50 50 50 50  Temp:    98.5 F (36.9 C)  TempSrc:    Oral  Resp: 16 15 14 12   Height:      Weight:      SpO2: 95% 95% 94% 94%    GEN- The patient is well appearing, alert and oriented x 3 today.   Head- normocephalic, atraumatic Eyes-  Sclera clear, conjunctiva pink Ears- hearing intact Oropharynx- clear Neck- supple, no JVP Lymph- no cervical lymphadenopathy Lungs- Clear to ausculation bilaterally, normal work of breathing Heart- Regular rate and rhythm, no murmurs, rubs or gallops, PMI not laterally displaced GI- soft, NT, ND, + BS Extremities- no clubbing, cyanosis, or edema MS- no significant deformity or atrophy Skin- no rash or lesion Psych- euthymic mood, full affect Neuro- strength and sensation are intact  EKG-  Labs:   Lab Results  Component Value Date   WBC 5.9 11/14/2015   HGB 14.9 11/14/2015   HCT 43.4 11/14/2015   MCV 93.9 11/14/2015   PLT 144* 11/14/2015    Recent Labs Lab 11/14/15 0010  NA 141  K 3.6  CL 106  CO2 25  BUN 10  CREATININE 1.26*  CALCIUM 9.5  GLUCOSE 139*   Lab Results  Component Value Date   CKTOTAL 74 06/10/2010   CKMB 0.9 06/10/2010   TROPONINI 0.05* 11/14/2015    Lab Results  Component Value Date   CHOL 137 07/30/2014   CHOL  06/10/2010    117        ATP III CLASSIFICATION:  <200     mg/dL   Desirable  200-239  mg/dL   Borderline High  >=240    mg/dL   High          Lab Results  Component Value Date   HDL 40 07/30/2014   HDL 27* 06/10/2010   Lab Results  Component Value Date   LDLCALC 80 07/30/2014   [place]  06/10/2010    50        Total Cholesterol/HDL:CHD Risk Coronary Heart Disease Risk Table                     Men   Women  1/2  Average Risk   3.4   3.3  Average Risk        5.0   4.4  2 X Average Risk   9.6   7.1  3 X Average Risk  23.4   11.0        Use the calculated Patient Ratio above and the CHD Risk Table to determine the patient's CHD Risk.        ATP III CLASSIFICATION (LDL):  <100     mg/dL   Optimal  100-129  mg/dL   Near or Above                    Optimal  130-159  mg/dL   Borderline  160-189  mg/dL   High  >190     mg/dL   Very High   Lab Results  Component Value Date   TRIG 87 07/30/2014   TRIG 198* 06/10/2010   Lab Results  Component Value Date   CHOLHDL 3.4 07/30/2014   CHOLHDL 4.3 06/10/2010   No results found for: LDLDIRECT    Radiology: CXR Left lung base subsegmental atelectasis. Pneumonia is less likely.  Cardiomegaly.  Echo:- Left ventricle: The cavity size was moderately dilated. Systolic function was moderately to severely reduced. The estimated ejection fraction was in the range of 30% to 35%. Akinesis of the inferolateral and inferior myocardium. Hypokinesis of the anteroseptal myocardium. - Aortic valve: There was trivial regurgitation. - Left atrium: The atrium was moderately dilated.  ASSESSMENT AND PLAN:  1. Ventricular tachycardia: Had episode of VT with ICD shocks.  Apparently single shock did not get him out of VT and he required an extra shock.  Adaley Kiene plan to start amiodarone today for VT.  He has not had chest pain, shortness of breath, or any other symptoms consistent with angina and has had a catheterization in 2015 which showed no furthering of his disease.  Neeva Trew therefore not plan to cath at this time.  He did not convert with 25J shock but did convert with 35J shock.  Savana Spina ask medtronic to adjust shock to maximum output.  2. Ischemic cardiomyopathy: continue ACEI, ASA, statin, beta blocker 3. HLD: continue statin 4. OSA: Eugina Row refer to sleep clinic as outpatient for evaluation of OSA and mask fitting  Dory Demont Meredith Leeds, MD 11/14/2015  9:08 AM

## 2015-11-14 NOTE — Progress Notes (Signed)
Spoke with Dr. Radford Pax, instructed to give lopressor

## 2015-11-14 NOTE — H&P (Signed)
CARDIOLOGY INPATIENT HISTORY AND PHYSICAL EXAMINATION NOTE  Patient ID: Paul [name] MRN: [id number], DOB/AGE: [birth year]   Admit date: 11/13/2015   Primary Physician: Chesley Noon, MD Primary Cardiologist: Landry Corporal MD Primary EP: Thompson Grayer MD  Reason for admission: Defibrillator discharge  HPI: This is a 65 y.o.white male with known history of CAD s/p CABG (patent LIMA to LAD, occluded SVG to OM and patent SVG to PDA) after inferior MI in 1999 and then admission in 07/2014 for sharp chest pain when he was found to have a sustained VT @188  bpm (unstable) and ended up getting dual chamber Medtronic ICD for secondary prevention of symptomatic VT. Patient has also a known history of chronic angina (2/2 occluded small branch vessel dz), ischemic cardiomyopathy (last LVEF 30-35%), OSA, HLD, depression, obesity, GERD, HTN and anxiety.  Patient today was doing well when he said that he started feeling palpitations which lasted for one hour and then he felt lightheaded and the defibrillator went off. He never passed out during the episode. He then again had another episode of defibrillation. He denied any chest pain, changes in his habits, PND, orthopnea, leg swelling, SOB more than usual. He saw Dr. Rayann Heman on 08/31/2015. Of note, patient has been going to cardiac rehabilitation regularly. Patient denied any nausea/vomiting, diarrhea or dehydration. He had feeling of palpitations when he had ATP. He has been regularly taking his medications. Patient had a heavy salt intake.  Regarding his VT, patient has known ischemic substrate which has been managed medically until now. He had a sustained VT episode in 07/2014 at which time he had implantation of Medtronic Evera device (details below). He was initially treated with amiodarone which was then stopped at discharge. Later on, he did okay until he followed in 08/2015 and was found to have 5 episodes of ATP treated VT (174 bpm) with nocturnal  episodes probably due to sleep apnea. He doe snot recall any of these episodes. During the visit, toproxl was increased to 100 mg qam and 50 mg qpm and was told not to drive for 6 months.  He initially went to [facility] via EMS and there he remained asymptomatic and underwent interrogation of the device. His initial vitals were: BP 115/65 mmHg  Pulse 57  Temp(Src) 98.3 F (36.8 C) (Oral)  Resp 16  Ht 5\' 8"  (1.727 m)  Wt 90.719 kg (200 lb)  BMI 30.42 kg/m2  SpO2 97% His creatinine is 1.6. BNP 179. Magnesium normal. Potassium is normal. Digoxin is undetectable.    EP interrogation Appropriate shock per interrogation. The device tried to withhold therapy since device was thinking it as SVT but then led to shock when his HR increased to 250  He received a ATP x 3 and VT broke (176 bpm). He then again went into faster VT (300 bpm) and received 25 J x 1 without breaking VT and then had 35J x 1 shock that broke the VT.  From yesterday, he received ATP converted VT around 2 AM VT zone = 167 - 200   VF >200  Therapies ATP burst for VT x 3, ramp x 3, 25 J and 35 J Last check 08/2015 Device details: Medtronic Evera XT RAXT DR model [id number] Number [id number], the right atrial lead is a Medtronic [id number] serial # [id number] and the right ventricular lead is a Medtronic model [id number] serial number [id number] placed by Dr. Rayann Heman.  Cardiographis:  EKG today showed normal sinus rhythm with normal axis, poor R wave  progression, inferior Q waves and lateral V5-V6 ST depression and non specific T wave changes Echo 08/01/2015 - LV is moderately dilated, LVEF is moderated reduced to 30-35%, with akinessis of the inferolateral and inferior myocardium in the OM territory. There is HK of the anteroseptal myocardium. Trivial AI and LA is moderately dilated.  Cardiac cath 07/30/2014:  Indication: unstable angina Findings: Severe native two-vessel coronary artery disease with occlusion of the mid LAD,  severe stenosis prior to a moderate-sized diagonal branch and moderate 50% disease prior to the circumflex marginal branch of the circumflex. Occluded right coronary artery distally with a severe 80% proximal stenosis. Patent saphenous vein graft to place the right coronary artery and patent mammary graft to LAD, previously occluded vein graft to the marginal system. Potential sites of ischemia are the distal posterolateral branch of the right coronary artery and moderate size diagonal branch.The vein graft to the posterior descending and posterolateral branch was patent  Problem List: Past Medical History  Diagnosis Date  . CAD (coronary artery disease)     widely patent LAD LIMA graft, widely patent PDA PLR SVG, occluded Diag 1 OM 1 SVG by cardiac CT 2007   . Ischemic cardiomyopathy     Prior inferior infarct EF 35% by ECHO 07/13/11    . Depression   . S/P CABG (coronary artery bypass graft)     CABG 06/1998 [place], [place] with LIMA to LAD, SVG to OM, SVG to PD-PL.   OM graft occluded 10/12/2005 by CTA   . Sleep apnea   . Hyperlipidemia   . GERD (gastroesophageal reflux disease)   . Ventricular tachycardia ([place]) 10/15    VT at 188 bpm- unstable requiring external defibrillation  . IBS (irritable bowel syndrome)   . BPH (benign prostatic hyperplasia)     Past Surgical History  Procedure Laterality Date  . Coronary artery bypass graft  1999  . Appendectomy    . Left heart catheterization with coronary/graft angiogram N/A 07/30/2014    Procedure: LEFT HEART CATHETERIZATION WITH Beatrix Fetters;  Surgeon: Jacolyn Reedy, MD;  Location: [facility] CATH LAB;  Service: Cardiovascular;  Laterality: N/A;  . Implantable cardioverter defibrillator implant N/A 07/31/2014    MDT Gwyneth Revels XT DR ICD implanted by Dr Rayann Heman for secondary treatent of VT     Allergies:  Allergies  Allergen Reactions  . Carvedilol     Fatigue   . Escitalopram Oxalate     unknown  . Niacin And Related     Flushing     . Spironolactone     gynecomastia     Home Medications No current facility-administered medications for this encounter.   Current Outpatient Prescriptions  Medication Sig Dispense Refill  . ALPRAZolam (XANAX) 0.5 MG tablet Take 1/2 to 1 tablet by mouth daily as needed for anxiety    . amoxicillin (AMOXIL) 500 MG capsule Take 2 capsules (1,000 mg total) by mouth 2 (two) times daily. 40 capsule 0  . Ascorbic Acid (VITAMIN C) 1000 MG tablet Take 1,000 mg by mouth daily.    Marland Kitchen aspirin EC 81 MG tablet Take 81 mg by mouth daily.    Marland Kitchen atorvastatin (LIPITOR) 80 MG tablet Take 80 mg by mouth daily.    . cefdinir (OMNICEF) 300 MG capsule Take 1 capsule (300 mg total) by mouth 2 (two) times daily. 10 capsule 0  . cetirizine (ZYRTEC) 10 MG tablet Take 10 mg by mouth daily.    . clopidogrel (PLAVIX) 75 MG tablet Take 75 mg  by mouth daily.    . Coenzyme Q10 50 MG CAPS Take 1 capsule by mouth daily.    . digoxin (LANOXIN) 0.125 MG tablet Take 0.125 mg by mouth daily.    . diphenoxylate-atropine (LOMOTIL) 2.5-0.025 MG tablet Take by mouth 4 (four) times daily as needed for diarrhea or loose stools.    Marland Kitchen eplerenone (INSPRA) 25 MG tablet Take 25 mg by mouth daily.    . fenofibrate 160 MG tablet Take 160 mg by mouth daily.    . fluticasone (FLONASE) 50 MCG/ACT nasal spray Place 2 sprays into both nostrils daily. 16 g 6  . folic acid (FOLVITE) Q000111Q MCG tablet Take 800 mcg by mouth daily.     . hyoscyamine (LEVSIN, ANASPAZ) 0.125 MG tablet Take 0.125 mg by mouth every 4 (four) hours as needed for cramping.    . metoprolol succinate (TOPROL-XL) 100 MG 24 hr tablet Take 100 mg ion the morning and 50mg (1/2 tablet)at night 135 tablet 3  . Multiple Vitamin (MULTIVITAMIN WITH MINERALS) TABS tablet Take 1 tablet by mouth daily.    . Omega-3 Fatty Acids (FISH OIL) 1000 MG CAPS Take 1 capsule by mouth daily.    Marland Kitchen omeprazole (PRILOSEC) 20 MG capsule Take 20 mg by mouth 2 (two) times daily before a meal.     .  ramipril (ALTACE) 10 MG capsule Take 10 mg by mouth daily.    . Vitamin E 400 UNITS TABS Take 800 Units by mouth daily.       Family History  Problem Relation Age of Onset  . Cerebral aneurysm Father   . Hepatitis Brother      Social History   Social History  . Marital Status: Married    Spouse Name: N/A  . Number of Children: N/A  . Years of Education: N/A   Occupational History  . Not on file.   Social History Main Topics  . Smoking status: Never Smoker   . Smokeless tobacco: Never Used  . Alcohol Use: 1.2 oz/week    2 Glasses of wine per week  . Drug Use: No  . Sexual Activity: Not on file   Other Topics Concern  . Not on file   Social History Narrative     Review of Systems: General: negative for chills, fever, night sweats or weight changes.  Cardiovascular: palpitations and presyncope negative for dyspnea on exertion, edema, orthopnea, paroxysmal nocturnal dyspnea or shortness of breath  Dermatological: negative for rash Respiratory: negative for cough or wheezing Urologic: negative for hematuria Abdominal: negative for nausea, vomiting, diarrhea, bright red blood per rectum, melena, or hematemesis Neurologic: negative for visual changes, syncope, or dizziness Endocrine: no diabetes, no hypothyroidism Immunological: no lymph adenopathy Psych: non homicidal/suicidal  Physical Exam: Vitals: BP 102/61 mmHg  Pulse 60  Temp(Src) 98.3 F (36.8 C) (Oral)  Resp 18  Ht 5\' 8"  (1.727 m)  Wt 90.719 kg (200 lb)  BMI 30.42 kg/m2  SpO2 96% General: not in acute distress Neck: JVP flat, neck supple Heart: regular rate and rhythm, S1, S2, no murmurs  Lungs: CTAB  GI: non tender, non distended, bowel sounds present Extremities: no edema Neuro: AAO x 3  Psych: normal affect, no anxiety   Labs:   Results for orders placed or performed during the hospital encounter of 11/13/15 (from the past 24 hour(s))  Basic metabolic panel     Status: Abnormal   Collection  Time: 11/14/15 12:10 AM  Result Value Ref Range   Sodium 141 135 -  145 mmol/L   Potassium 3.6 3.5 - 5.1 mmol/L   Chloride 106 101 - 111 mmol/L   CO2 25 22 - 32 mmol/L   Glucose, Bld 139 (H) 65 - 99 mg/dL   BUN 10 6 - 20 mg/dL   Creatinine, Ser 1.26 (H) 0.61 - 1.24 mg/dL   Calcium 9.5 8.9 - 10.3 mg/dL   GFR calc non Af Amer 59 (L) >60 mL/min   GFR calc Af Amer >60 >60 mL/min   Anion gap 10 5 - 15  Troponin I     Status: Abnormal   Collection Time: 11/14/15 12:10 AM  Result Value Ref Range   Troponin I 0.05 (H) <0.031 ng/mL  Brain natriuretic peptide     Status: Abnormal   Collection Time: 11/14/15 12:10 AM  Result Value Ref Range   B Natriuretic Peptide 179.0 (H) 0.0 - 100.0 pg/mL  CBC with Differential     Status: None   Collection Time: 11/14/15 12:10 AM  Result Value Ref Range   WBC 5.4 4.0 - 10.5 K/uL   RBC 4.71 4.22 - 5.81 MIL/uL   Hemoglobin 15.3 13.0 - 17.0 g/dL   HCT 44.2 39.0 - 52.0 %   MCV 93.8 78.0 - 100.0 fL   MCH 32.5 26.0 - 34.0 pg   MCHC 34.6 30.0 - 36.0 g/dL   RDW 12.9 11.5 - 15.5 %   Platelets 163 150 - 400 K/uL   Neutrophils Relative % 52 %   Neutro Abs 2.9 1.7 - 7.7 K/uL   Lymphocytes Relative 33 %   Lymphs Abs 1.8 0.7 - 4.0 K/uL   Monocytes Relative 11 %   Monocytes Absolute 0.6 0.1 - 1.0 K/uL   Eosinophils Relative 4 %   Eosinophils Absolute 0.2 0.0 - 0.7 K/uL   Basophils Relative 0 %   Basophils Absolute 0.0 0.0 - 0.1 K/uL  Magnesium     Status: Abnormal   Collection Time: 11/14/15 12:10 AM  Result Value Ref Range   Magnesium 1.5 (L) 1.7 - 2.4 mg/dL  Digoxin level     Status: Abnormal   Collection Time: 11/14/15 12:10 AM  Result Value Ref Range   Digoxin Level 0.6 (L) 0.8 - 2.0 ng/mL     Radiology/Studies: Dg Chest 2 View  11/14/2015  CLINICAL DATA:  65 year old male with palpitation. EXAM: CHEST  2 VIEW COMPARISON:  None. FINDINGS: Two views of the chest demonstrate mild left basilar densities, likely atelectatic changes. Pneumonia is less  likely. No focal consolidation, pleural effusion, or pneumothorax. Mild cardiomegaly. Median sternotomy wires and left pectoral AICD device. IMPRESSION: Left lung base subsegmental atelectasis.  Pneumonia is less likely. Cardiomegaly. Electronically Signed   By: Anner Crete M.D.   On: 11/14/2015 01:23     Medical decision making:  Discussed care with the patient Discussed care with the ED physician on the phone Reviewed labs and imaging personally Reviewed prior records  ASSESSMENT AND PLAN:  This is a 65 y.o.white male with known history of CAD s/p CABG (patent LIMA to LAD, occluded SVG to OM and patent SVG to PDA) after inferior MI in 1999 and then admission in 07/2014 for sharp chest pain when he was found to have a sustained VT @188  bpm (unstable) and ended up getting dual chamber Medtronic ICD for secondary prevention of symptomatic VT. Patient has also a known history of chronic angina (2/2 occluded small branch vessel dz), ischemic cardiomyopathy (last LVEF 30-35%), OSA, HLD, depression, obesity, GERD, HTN and anxiety  who presented with 2 x defibrillator discharge, lightheadedness and palpitations today. Interrogation revealed appropriate shocks.    Principal Problem:   Ventricular tachycardia (HCC) Active Problems:   CAD (coronary artery disease)   S/P CABG (coronary artery bypass graft)   Hyperlipidemia   Sleep apnea   Depression   Ischemic cardiomyopathy   AICD (automatic cardioverter/defibrillator) present   Defibrillator discharge   AKI (acute kidney injury) ([place])  Ventricular tachycardia Increased the dose of metoprolol xl to 100 mg BID If continues to have rhythm issues, will need to either add mexiletine or amiodarone. Sotalol could be an option but due to current renal failure and low LVEF might not be appropriate at the moment.  Will repeat echocardiogram  I am not sure if repeating ischemic evaluation would add to management since patient is asymptomatic while  doing cardiac rehabilitation Outpatient sleep apnea evaluation as several of the VT episodes are nocturnal and patient might need adjustment of CPAP machine No driving for 6 months  Ischemic CMP - continue ACEi, aspirin, statin AKI - checking urine lytes. Likely dehydration.  Hypomagnesemia - replace magnesium HLD - continue statin  Sleep apnea - not on CPAP, unable to use CPAP in the past. He has a dental device which he does not use every day Depression - continue home meds  Signed, Flossie Dibble, MD MS 11/14/2015, 2:01 AM

## 2015-11-15 ENCOUNTER — Inpatient Hospital Stay (HOSPITAL_COMMUNITY): Payer: PPO

## 2015-11-15 DIAGNOSIS — I472 Ventricular tachycardia: Secondary | ICD-10-CM | POA: Diagnosis not present

## 2015-11-15 MED ORDER — AMIODARONE HCL 200 MG PO TABS
400.0000 mg | ORAL_TABLET | Freq: Two times a day (BID) | ORAL | Status: DC
  Administered 2015-11-15 – 2015-11-16 (×3): 400 mg via ORAL
  Filled 2015-11-15 (×3): qty 2

## 2015-11-15 NOTE — Progress Notes (Signed)
SUBJECTIVE: The patient is doing well today.  At this time, he denies chest pain, shortness of breath, or any new concerns.  Marland Kitchen aspirin EC  81 mg Oral Daily  . atorvastatin  80 mg Oral Daily  . clopidogrel  75 mg Oral Daily  . digoxin  0.125 mg Oral Daily  . fenofibrate  160 mg Oral Daily  . folic acid  1 mg Oral Daily  . heparin  5,000 Units Subcutaneous 3 times per day  . metoprolol succinate  100 mg Oral BID  . ramipril  10 mg Oral Daily  . sodium chloride  3 mL Intravenous Q12H   . sodium chloride 10 mL/hr at 11/14/15 2211  . amiodarone 30 mg/hr (11/14/15 2229)    OBJECTIVE: Physical Exam: Filed Vitals:   11/14/15 1900 11/14/15 2000 11/14/15 2100 11/14/15 2200  BP: 126/68 112/69 112/71   Pulse: 50 50 50 50  Temp:  98.2 F (36.8 C)    TempSrc:  Oral    Resp: 15 15 14 15   Height:      Weight:      SpO2: 96% 94% 93% 94%    Intake/Output Summary (Last 24 hours) at 11/15/15 0754 Last data filed at 11/15/15 0700  Gross per 24 hour  Intake 3199.92 ml  Output   1500 ml  Net 1699.92 ml    Telemetry reveals sinus rhythm  GEN- The patient is well appearing, alert and oriented x 3 today.   Head- normocephalic, atraumatic Eyes-  Sclera clear, conjunctiva pink Ears- hearing intact Oropharynx- clear Neck- supple, no JVP Lymph- no cervical lymphadenopathy Lungs- Clear to ausculation bilaterally, normal work of breathing Heart- Regular rate and rhythm, no murmurs, rubs or gallops, PMI not laterally displaced GI- soft, NT, ND, + BS Extremities- no clubbing, cyanosis, or edema Skin- no rash or lesion Psych- euthymic mood, full affect Neuro- strength and sensation are intact  LABS: Basic Metabolic Panel:  Recent Labs  11/14/15 0010 11/14/15 0750  NA 141 141  K 3.6 4.4  CL 106 108  CO2 25 26  GLUCOSE 139* 107*  BUN 10 6  CREATININE 1.26* 1.23  CALCIUM 9.5 9.0  MG 1.5*  --    Liver Function Tests:  Recent Labs  11/14/15 0750  AST 30  ALT 24  ALKPHOS  55  BILITOT 0.8  PROT 6.2*  ALBUMIN 3.6   No results for input(s): LIPASE, AMYLASE in the last 72 hours. CBC:  Recent Labs  11/14/15 0010 11/14/15 0750  WBC 5.4 5.9  NEUTROABS 2.9  --   HGB 15.3 14.9  HCT 44.2 43.4  MCV 93.8 93.9  PLT 163 144*   Cardiac Enzymes:  Recent Labs  11/14/15 0010  TROPONINI 0.05*   BNP: Invalid input(s): POCBNP D-Dimer: No results for input(s): DDIMER in the last 72 hours. Hemoglobin A1C: No results for input(s): HGBA1C in the last 72 hours. Fasting Lipid Panel: No results for input(s): CHOL, HDL, LDLCALC, TRIG, CHOLHDL, LDLDIRECT in the last 72 hours. Thyroid Function Tests:  Recent Labs  11/14/15 0750  TSH 1.676   Anemia Panel: No results for input(s): VITAMINB12, FOLATE, FERRITIN, TIBC, IRON, RETICCTPCT in the last 72 hours.  RADIOLOGY: Dg Chest 2 View  11/14/2015  CLINICAL DATA:  65 year old male with palpitation. EXAM: CHEST  2 VIEW COMPARISON:  None. FINDINGS: Two views of the chest demonstrate mild left basilar densities, likely atelectatic changes. Pneumonia is less likely. No focal consolidation, pleural effusion, or pneumothorax. Mild cardiomegaly. Median sternotomy  wires and left pectoral AICD device. IMPRESSION: Left lung base subsegmental atelectasis.  Pneumonia is less likely. Cardiomegaly. Electronically Signed   By: Anner Crete M.D.   On: 11/14/2015 01:23    ASSESSMENT AND PLAN:  Principal Problem:   Ventricular tachycardia (HCC) Active Problems:   CAD (coronary artery disease)   S/P CABG (coronary artery bypass graft)   Hyperlipidemia   Sleep apnea   Depression   Ischemic cardiomyopathy   AICD (automatic cardioverter/defibrillator) present   Defibrillator discharge   AKI (acute kidney injury) ([place])  No further VT on amiodarone.  Amiodarone drip almost over, Noelly Lasseigne start him on oral amiodarone after the IV load ends.  He Yoceline Bazar also be transferred to the floor today.  As he endorses no chest pain, cath may not  be warranted at this time.  2. Ischemic cardiomyopathy: continue ACEI, ASA, statin, beta blocker 3. HLD: continue statin 4. OSA: Evangelina Delancey refer to sleep clinic as outpatient for evaluation of OSA and mask fitting   Jayda White Meredith Leeds, MD 11/15/2015 7:54 AM

## 2015-11-15 NOTE — Progress Notes (Signed)
  Echocardiogram 2D Echocardiogram has been performed.  Paul [name] M 11/15/2015, 5:09 PM

## 2015-11-16 ENCOUNTER — Encounter (HOSPITAL_COMMUNITY): Payer: PPO

## 2015-11-16 DIAGNOSIS — I255 Ischemic cardiomyopathy: Secondary | ICD-10-CM | POA: Diagnosis not present

## 2015-11-16 DIAGNOSIS — Z951 Presence of aortocoronary bypass graft: Secondary | ICD-10-CM

## 2015-11-16 DIAGNOSIS — I472 Ventricular tachycardia: Secondary | ICD-10-CM | POA: Diagnosis not present

## 2015-11-16 DIAGNOSIS — Z95810 Presence of automatic (implantable) cardiac defibrillator: Secondary | ICD-10-CM | POA: Diagnosis not present

## 2015-11-16 MED ORDER — AMIODARONE HCL 400 MG PO TABS: ORAL_TABLET | ORAL | Status: DC

## 2015-11-16 MED ORDER — METOPROLOL SUCCINATE ER 100 MG PO TB24: 100.0000 mg | ORAL_TABLET | Freq: Two times a day (BID) | ORAL | Status: DC

## 2015-11-16 NOTE — Progress Notes (Signed)
SUBJECTIVE: The patient is doing well today.  At this time, he denies chest pain, shortness of breath, or any new concerns. He is anxious about repeat ICD therapy.   Admitted 11/13/15 with recurrent VT and ICD shocks. Device originally withheld therapy for VT with ST discriminator even though V>A (that episode lasted 1 hour 46 minutes).  Device then detected VT at a cycle length of 333msec.  3rd spin of  ATP delivered and VT broke but resulted in AP next beat followed by PVC with long short into faster VT at 288msec.  1st shock failed to terminate, episode terminated with second shock. Pt has been loaded with IV amiodarone with no recurrent sustained VT.   Echo 11/15/15 demonstrated EF 25-30% (previously 30-35%), severe diffuse hypokinesis of inferior myocardium.   CURRENT MEDICATIONS: . amiodarone  400 mg Oral BID  . aspirin EC  81 mg Oral Daily  . atorvastatin  80 mg Oral Daily  . clopidogrel  75 mg Oral Daily  . digoxin  0.125 mg Oral Daily  . fenofibrate  160 mg Oral Daily  . folic acid  1 mg Oral Daily  . heparin  5,000 Units Subcutaneous 3 times per day  . metoprolol succinate  100 mg Oral BID  . ramipril  10 mg Oral Daily  . sodium chloride  3 mL Intravenous Q12H   . sodium chloride 10 mL/hr at 11/15/15 0800    OBJECTIVE: Physical Exam: Filed Vitals:   11/15/15 1300 11/15/15 2012 11/16/15 0114 11/16/15 0615  BP: 103/76 127/65 117/60 128/68  Pulse: 54 51 49 45  Temp:  98.7 F (37.1 C) 98.7 F (37.1 C) 98.2 F (36.8 C)  TempSrc:  Oral Oral Oral  Resp: 14 15 18 20   Height: 5\' 8"  (1.727 m)     Weight: 190 lb 4.8 oz (86.32 kg)   193 lb 14.4 oz (87.952 kg)  SpO2: 97% 93% 92% 96%    Intake/Output Summary (Last 24 hours) at 11/16/15 0740 Last data filed at 11/16/15 0200  Gross per 24 hour  Intake 1186.8 ml  Output    200 ml  Net  986.8 ml    Telemetry reveals atrial pacing with intrinsic ventricular conduction, PVC's  GEN- The patient is well appearing, alert and  oriented x 3 today.   Head- normocephalic, atraumatic Eyes-  Sclera clear, conjunctiva pink Ears- hearing intact Oropharynx- clear Neck- supple Lungs- Clear to ausculation bilaterally, normal work of breathing Heart- Regular rate and rhythm  GI- soft, NT, ND, + BS Extremities- no clubbing, cyanosis, or edema Skin- no rash or lesion Psych- euthymic mood, full affect Neuro- strength and sensation are intact  LABS: Basic Metabolic Panel:  Recent Labs  11/14/15 0010 11/14/15 0750  NA 141 141  K 3.6 4.4  CL 106 108  CO2 25 26  GLUCOSE 139* 107*  BUN 10 6  CREATININE 1.26* 1.23  CALCIUM 9.5 9.0  MG 1.5*  --    Liver Function Tests:  Recent Labs  11/14/15 0750  AST 30  ALT 24  ALKPHOS 55  BILITOT 0.8  PROT 6.2*  ALBUMIN 3.6   CBC:  Recent Labs  11/14/15 0010 11/14/15 0750  WBC 5.4 5.9  NEUTROABS 2.9  --   HGB 15.3 14.9  HCT 44.2 43.4  MCV 93.8 93.9  PLT 163 144*   Cardiac Enzymes:  Recent Labs  11/14/15 0010  TROPONINI 0.05*   Thyroid Function Tests:  Recent Labs  11/14/15 0750  TSH  1.676    RADIOLOGY: Dg Chest 2 View 11/14/2015  CLINICAL DATA:  65 year old male with palpitation. EXAM: CHEST  2 VIEW COMPARISON:  None. FINDINGS: Two views of the chest demonstrate mild left basilar densities, likely atelectatic changes. Pneumonia is less likely. No focal consolidation, pleural effusion, or pneumothorax. Mild cardiomegaly. Median sternotomy wires and left pectoral AICD device. IMPRESSION: Left lung base subsegmental atelectasis.  Pneumonia is less likely. Cardiomegaly. Electronically Signed   By: Anner Crete M.D.   On: 11/14/2015 01:23    ASSESSMENT AND PLAN:  Principal Problem:   Ventricular tachycardia (HCC) Active Problems:   CAD (coronary artery disease)   S/P CABG (coronary artery bypass graft)   Hyperlipidemia   Sleep apnea   Depression   Ischemic cardiomyopathy   AICD (automatic cardioverter/defibrillator) present   Defibrillator  discharge   AKI (acute kidney injury) ([place])    1.  Ventricular tachycardia Device reprogrammed today and ST discriminator turned off, rate response turned on and another spin of ATP given in VT zone.  Continue amiodarone 400mg  bid x 1 week then 400mg  daily for 1 week then 200mg  daily (LFT's and TSH normal this admission, will need outpatient PFT's) No driving x6 months - patient aware Keep K >3.9, Mg> 1.8 (will check labs this morning) No driving x 6 months EF slightly decreased this admission.  Patient without anginal symptoms. Dr Rayann Heman discussed with Dr Wynonia Lawman, no plans for ischemic eval at this time.  2. ICM/CAD No recent ischemic symptoms, however, the patient has not been very active at home. He was scheduled to begin cardiac rehab today.  Continue medical therapy  3.  OSA Pt previously unable to tolerate CPAP He has been using dental appliance without effective results He is planning to call and have repeat sleep evaluation done  4.  Sinus bradycardia Rate response turned on today - anticipate patient will need brady support with increased metoprolol and amiodarone Base rate currently at 50bpm, not changed today, will re-evaluate in the office next week  Plan discharge home later today. Follow up with me in the office next week. Will schedule PFT's at that time. Enroll in [facility] clinic for closer follow up of volume status and VT burden - discussed with patient today who is agreeable.   [name], NP 11/16/2015 8:23 AM  I have seen, examined the patient, and reviewed the above assessment and plan.  Pt discussed with Dr Wynonia Lawman.  No indication for further ischemic workup presently.  SVT discriminators adjusted to allow for appropriate detection and treatment of VT.  Will likely increase lower pacing rate upon follow-up in the office.  Changes to above are made where necessary.    Co Sign: Thompson Grayer, MD 11/16/2015 9:57 AM

## 2015-11-16 NOTE — Discharge Summary (Signed)
ELECTROPHYSIOLOGY PROCEDURE DISCHARGE SUMMARY    Patient ID: Paul [name],  MRN: [id number], DOB/AGE: 1951/04/26 65 y.o.  Admit date: 11/13/2015 Discharge date: 11/16/2015  Primary Care Physician: Chesley Noon, MD Primary Cardiologist: Wynonia Lawman Electrophysiologist: Harleigh Civello  Primary Discharge Diagnosis:  Principal Problem:   Ventricular tachycardia [facility]) Active Problems:   CAD (coronary artery disease)   S/P CABG (coronary artery bypass graft)   Hyperlipidemia   Sleep apnea   Depression   Ischemic cardiomyopathy   AKI (acute kidney injury) ([place])   Allergies  Allergen Reactions  . Carvedilol     Fatigue   . Escitalopram Oxalate     unknown  . Niacin And Related     Flushing   . Spironolactone     gynecomastia   Brief HPI/Hospital Course:  Paul [name] is a 65 y.o. male with a past medical history significant for CAD s/p CABG, hemodynamically unstable VT s/p ICD, hyperlipidemia, and depression.  He has had recent increase in VT burden and his Metoprolol was increased as an outpatient.  On the day of admission, he developed sustained tachypalpitations that did not resolve.  He ultimately received 2 ICD shocks and called EMS for further evaluation.  He was seen at [facility] where his device was interrogated and transferred to [facility].  Device interrogation demonstrated that the device originally withheld therapy for VT with ST discriminator even though V>A (that episode lasted 1 hour 46 minutes). Device then detected VT at a cycle length of 360msec. 3rd spin of ATP delivered and VT broke but resulted in AP next beat followed by PVC with long short into faster VT at 242msec. 1st shock failed to terminate, episode terminated with second shock. Pt was loaded with amiodarone with no recurrent sustained VT.  Echocardiogram demonstrated EF 25-30% with diffuse hypokinesis of inferior myocardium.  On the day of discharge, he was seen by Dr Rayann Heman who considered him stable for  discharge to home. Dr Rayann Heman discussed need for ischemic eval with Dr Wynonia Lawman. Both felt that in the absence of symptoms, no ischemic eval was needed at this time. Device was reprogrammed on the day of discharge to turn ST discriminator off, rate response turned on, and another round of ATP was added in VT zone. Base rate left at 50 for now, will evaluate need to increase base rate as an outpatient.  TSH and LFT's normal this admission, pt will need outpatient PFT's (will schedule at follow up visit). No driving x6 months (pt aware).   The patient has also previously been diagnosed with sleep apnea but was unable to tolerate CPAP in the past. He will follow up on repeat outpatient sleep evaluation.    Physical Exam: Filed Vitals:   11/15/15 1300 11/15/15 2012 11/16/15 0114 11/16/15 0615  BP: 103/76 127/65 117/60 128/68  Pulse: 54 51 49 45  Temp:  98.7 F (37.1 C) 98.7 F (37.1 C) 98.2 F (36.8 C)  TempSrc:  Oral Oral Oral  Resp: 14 15 18 20   Height: 5\' 8"  (1.727 m)     Weight: 190 lb 4.8 oz (86.32 kg)   193 lb 14.4 oz (87.952 kg)  SpO2: 97% 93% 92% 96%    Labs:   Lab Results  Component Value Date   WBC 5.9 11/14/2015   HGB 14.9 11/14/2015   HCT 43.4 11/14/2015   MCV 93.9 11/14/2015   PLT 144* 11/14/2015     Recent Labs Lab 11/14/15 0750  NA 141  K 4.4  CL 108  CO2 26  BUN 6  CREATININE 1.23  CALCIUM 9.0  PROT 6.2*  BILITOT 0.8  ALKPHOS 55  ALT 24  AST 30  GLUCOSE 107*     Discharge Medications:  Current Discharge Medication List    START taking these medications   Details  amiodarone (PACERONE) 400 MG tablet Take 400mg  twice daily for 1 week then 400mg  daily for 1 week then 200mg  daily Qty: 120 tablet, Refills: 1      CONTINUE these medications which have CHANGED   Details  metoprolol succinate (TOPROL-XL) 100 MG 24 hr tablet Take 1 tablet (100 mg total) by mouth 2 (two) times daily. Qty: 120 tablet, Refills: 3      CONTINUE these medications which  have NOT CHANGED   Details  ALPRAZolam (XANAX) 0.5 MG tablet Take 1/2 to 1 tablet by mouth daily as needed for anxiety    Ascorbic Acid (VITAMIN C) 1000 MG tablet Take 1,000 mg by mouth daily.    aspirin EC 81 MG tablet Take 81 mg by mouth daily.    atorvastatin (LIPITOR) 80 MG tablet Take 80 mg by mouth daily.    cetirizine (ZYRTEC) 10 MG tablet Take 10 mg by mouth daily.    clopidogrel (PLAVIX) 75 MG tablet Take 75 mg by mouth daily.    Coenzyme Q10 50 MG CAPS Take 1 capsule by mouth daily.    digoxin (LANOXIN) 0.125 MG tablet Take 0.125 mg by mouth daily.    diphenoxylate-atropine (LOMOTIL) 2.5-0.025 MG tablet Take by mouth 4 (four) times daily as needed for diarrhea or loose stools.    eplerenone (INSPRA) 25 MG tablet Take 25 mg by mouth daily.    fenofibrate 160 MG tablet Take 160 mg by mouth daily.    folic acid (FOLVITE) Q000111Q MCG tablet Take 800 mcg by mouth daily.     hyoscyamine (LEVSIN, ANASPAZ) 0.125 MG tablet Take 0.125 mg by mouth every 4 (four) hours as needed for cramping.    Multiple Vitamin (MULTIVITAMIN WITH MINERALS) TABS tablet Take 1 tablet by mouth daily.    Omega-3 Fatty Acids (FISH OIL) 1000 MG CAPS Take 1 capsule by mouth daily.    ramipril (ALTACE) 10 MG capsule Take 10 mg by mouth daily.    Vitamin E 400 UNITS TABS Take 800 Units by mouth daily.    fluticasone (FLONASE) 50 MCG/ACT nasal spray Place 2 sprays into both nostrils daily. Qty: 16 g, Refills: 6   Associated Diagnoses: Nasal congestion    omeprazole (PRILOSEC) 20 MG capsule Take 20 mg by mouth 2 (two) times daily before a meal.       STOP taking these medications     amoxicillin (AMOXIL) 500 MG capsule      cefdinir (OMNICEF) 300 MG capsule         Disposition: Pt is being discharged home today in good condition. Discharge Instructions    Diet - low sodium heart healthy    Complete by:  As directed      Discharge instructions    Complete by:  As directed   No driving x6  months     Increase activity slowly    Complete by:  As directed           Follow-up Information    Follow up with Patsey Berthold, NP On 11/27/2015.   Specialty:  Cardiology   Why:  at 8:20AM   Contact information:   [place] [address] [phone]  Duration of Discharge Encounter: Greater than 30 minutes including physician time.  Signed, [name], NP 11/16/2015 9:07 AM   I have seen, examined the patient, and reviewed the above assessment and plan.  Pt discussed with Dr Wynonia Lawman.  No indication for further ischemic workup presently.  SVT discriminators adjusted to allow for appropriate detection and treatment of VT.  Will likely increase lower pacing rate upon follow-up in the office.  Amiodarone initiated by Dr Curt Bears.  Risks of this medicine discussed at length with the patient.  He would like to continue amiodarone at this time.  Changes to above are made where necessary.    Co Sign: Thompson Grayer, MD 11/16/2015 9:57 AM

## 2015-11-18 ENCOUNTER — Encounter (HOSPITAL_COMMUNITY): Payer: PPO

## 2015-11-20 ENCOUNTER — Telehealth: Payer: Self-pay

## 2015-11-20 ENCOUNTER — Encounter (HOSPITAL_COMMUNITY): Payer: PPO

## 2015-11-20 NOTE — Telephone Encounter (Signed)
Call back to patient and advised Paul Marshall, NP stated he can hold off on starting cardiac rehab until he sees her in the office.  Provided patient with earlier appointment with [name] and now scheduled on 11/25/2015 at 1:40PM.  Advised I will set up the remote transmission after he visits [name] on 2/1.     Patient referred to [facility] clinic by Paul Marshall, NP.  Call to patient and provided ICM intro and he agreed to monthly ICM follow up.  He reported he was discharged from the hospital on 10/27/2015 and having difficulty to adjusting to his heart rate.  He stated sitting it is around 50 but when he stands it increases to 90.  He asked if he should start cardiac rehab on Monday.  He currently has an appointment with Paul [name] on 11/27/2015.  I stated I would check with [name] if he should start rehab and if she had earlier appointment.

## 2015-11-23 ENCOUNTER — Encounter (HOSPITAL_COMMUNITY): Payer: PPO

## 2015-11-25 ENCOUNTER — Encounter: Payer: Self-pay | Admitting: Internal Medicine

## 2015-11-25 ENCOUNTER — Encounter (HOSPITAL_COMMUNITY): Payer: PPO

## 2015-11-25 ENCOUNTER — Encounter: Payer: Self-pay | Admitting: Nurse Practitioner

## 2015-11-25 ENCOUNTER — Ambulatory Visit (INDEPENDENT_AMBULATORY_CARE_PROVIDER_SITE_OTHER): Payer: PPO | Admitting: Nurse Practitioner

## 2015-11-25 VITALS — BP 110/80 | HR 60 | Ht 67.5 in | Wt 195.0 lb

## 2015-11-25 DIAGNOSIS — I255 Ischemic cardiomyopathy: Secondary | ICD-10-CM | POA: Diagnosis not present

## 2015-11-25 DIAGNOSIS — Z79899 Other long term (current) drug therapy: Secondary | ICD-10-CM

## 2015-11-25 DIAGNOSIS — I5022 Chronic systolic (congestive) heart failure: Secondary | ICD-10-CM | POA: Diagnosis not present

## 2015-11-25 DIAGNOSIS — I472 Ventricular tachycardia, unspecified: Secondary | ICD-10-CM

## 2015-11-25 NOTE — Progress Notes (Signed)
Electrophysiology Office Note Date: 11/25/2015  ID:  Paul [name], DOB [birth year], MRN [id number]  PCP: Chesley Noon, MD Primary Cardiologist: Wynonia Lawman Electrophysiologist: Allred  CC: hospital follow-up  Paul [name] is a 65 y.o. male seen today for Dr Rayann Heman. He was admitted 11/13/15 with VT and ICD shocks. His device originally withheld therapy for VT with ST discriminator even though V>A (that episode lasted 1 hour 46 minutes). Device then detected VT at a cycle length of 368msec. 3rd spin of ATP delivered and VT broke but resulted in AP next beat followed by PVC with long short into faster VT at 278msec. 1st shock failed to terminate, episode terminated with second shock. Pt was loaded with amiodarone.  He presents today for post hospital electrophysiology followup.  Since discharge, the patient reports doing reasonably well.  He has a fair amount of anxiety around recurrent VT.  He also has an awareness of increased heart rate while walking around the house (he has a pulse ox and checks his pulse often at home).  He denies chest pain, palpitations, dyspnea, PND, orthopnea, nausea, vomiting, dizziness, syncope, edema, weight gain, or early satiety.  He has not had ICD shocks.   Device History: MDT dual chamber ICD implanted 2015 for ICM and VT History of appropriate therapy: Yes History of AAD therapy: Yes - amiodarone    Past Medical History  Diagnosis Date  . CAD (coronary artery disease)     widely patent LAD LIMA graft, widely patent PDA PLR SVG, occluded Diag 1 OM 1 SVG by cardiac CT 2007   . Ischemic cardiomyopathy     Prior inferior infarct EF 35% by ECHO 07/13/11    . Depression   . S/P CABG (coronary artery bypass graft)     CABG 06/1998 [place], [place] with LIMA to LAD, SVG to OM, SVG to PD-PL.   OM graft occluded 10/12/2005 by CTA   . Sleep apnea   . Hyperlipidemia   . GERD (gastroesophageal reflux disease)   . Ventricular tachycardia ([place]) 10/15    VT at 188 bpm-  unstable requiring external defibrillation  . IBS (irritable bowel syndrome)   . BPH (benign prostatic hyperplasia)    Past Surgical History  Procedure Laterality Date  . Coronary artery bypass graft  1999  . Appendectomy    . Left heart catheterization with coronary/graft angiogram N/A 07/30/2014    Procedure: LEFT HEART CATHETERIZATION WITH Beatrix Fetters;  Surgeon: Jacolyn Reedy, MD;  Location: [facility] CATH LAB;  Service: Cardiovascular;  Laterality: N/A;  . Implantable cardioverter defibrillator implant N/A 07/31/2014    MDT Gwyneth Revels XT DR ICD implanted by Dr Rayann Heman for secondary treatent of VT    Current Outpatient Prescriptions  Medication Sig Dispense Refill  . ALPRAZolam (XANAX) 0.5 MG tablet Take 1/2 to 1 tablet by mouth daily as needed for anxiety    . amiodarone (PACERONE) 400 MG tablet Take 400mg  twice daily for 1 week then 400mg  daily for 1 week then 200mg  daily (Patient taking differently: Take 400 mg by mouth daily. Take 400mg  twice daily for 1 week then 400mg  daily for 1 week then 200mg  daily) 120 tablet 1  . Ascorbic Acid (VITAMIN C) 1000 MG tablet Take 1,000 mg by mouth daily.    Marland Kitchen aspirin EC 81 MG tablet Take 81 mg by mouth daily.    Marland Kitchen atorvastatin (LIPITOR) 80 MG tablet Take 80 mg by mouth daily.    . cetirizine (ZYRTEC) 10 MG tablet Take 10  mg by mouth daily.    . clopidogrel (PLAVIX) 75 MG tablet Take 75 mg by mouth daily.    . Coenzyme Q10 50 MG CAPS Take 1 capsule by mouth daily.    . diphenoxylate-atropine (LOMOTIL) 2.5-0.025 MG tablet Take by mouth 4 (four) times daily as needed for diarrhea or loose stools.    Marland Kitchen eplerenone (INSPRA) 25 MG tablet Take 25 mg by mouth daily.    . fenofibrate 160 MG tablet Take 160 mg by mouth daily.    . fluticasone (FLONASE) 50 MCG/ACT nasal spray Place 2 sprays into both nostrils daily. 16 g 6  . folic acid (FOLVITE) Q000111Q MCG tablet Take 800 mcg by mouth daily.     . hyoscyamine (LEVSIN, ANASPAZ) 0.125 MG tablet Take 0.125 mg  by mouth every 4 (four) hours as needed for cramping.    . metoprolol succinate (TOPROL-XL) 100 MG 24 hr tablet Take 1 tablet (100 mg total) by mouth 2 (two) times daily. 120 tablet 3  . Multiple Vitamin (MULTIVITAMIN WITH MINERALS) TABS tablet Take 1 tablet by mouth daily.    . Omega-3 Fatty Acids (FISH OIL) 1000 MG CAPS Take 1 capsule by mouth daily.    Marland Kitchen omeprazole (PRILOSEC) 20 MG capsule Take 20 mg by mouth 2 (two) times daily before a meal.     . ramipril (ALTACE) 10 MG capsule Take 10 mg by mouth daily.    . Vitamin E 400 UNITS TABS Take 800 Units by mouth daily.    . digoxin (LANOXIN) 0.125 MG tablet Take 0.125 mg by mouth daily. Reported on 11/25/2015     No current facility-administered medications for this visit.    Allergies:   Carvedilol; Escitalopram oxalate; Niacin and related; and Spironolactone   Social History: Social History   Social History  . Marital Status: Married    Spouse Name: N/A  . Number of Children: N/A  . Years of Education: N/A   Occupational History  . Not on file.   Social History Main Topics  . Smoking status: Never Smoker   . Smokeless tobacco: Never Used  . Alcohol Use: 1.2 oz/week    2 Glasses of wine per week  . Drug Use: No  . Sexual Activity: Not on file   Other Topics Concern  . Not on file   Social History Narrative    Family History: Family History  Problem Relation Age of Onset  . Cerebral aneurysm Father   . Hepatitis Brother   . Diabetes Neg Hx   . Heart attack Neg Hx   . Stroke Maternal Aunt   . Hypertension Neg Hx     Review of Systems: All other systems reviewed and are otherwise negative except as noted above.   Physical Exam: VS:  BP 110/80 mmHg  Pulse 60  Ht 5' 7.5" (1.715 m)  Wt 195 lb (88.451 kg)  BMI 30.07 kg/m2 , BMI Body mass index is 30.07 kg/(m^2).  GEN- The patient is well appearing, alert and oriented x 3 today.   HEENT: normocephalic, atraumatic; sclera clear, conjunctiva pink; hearing intact;  oropharynx clear; neck supple  Lungs- Clear to ausculation bilaterally, normal work of breathing.  No wheezes, rales, rhonchi Heart- Regular rate and rhythm  GI- soft, non-tender, non-distended, bowel sounds present  Extremities- no clubbing, cyanosis, or edema; DP/PT/radial pulses 2+ bilaterally MS- no significant deformity or atrophy Skin- warm and dry, no rash or lesion; ICD pocket well healed Psych- euthymic mood, full affect Neuro- strength and  sensation are intact  ICD interrogation- reviewed in detail today,  See PACEART report  EKG:  EKG is ordered today. The ekg ordered today shows atrial pacing with intrinsic ventricular conduction   Recent Labs: 11/14/2015: ALT 24; B Natriuretic Peptide 179.0*; BUN 6; Creatinine, Ser 1.23; Hemoglobin 14.9; Magnesium 1.5*; Platelets 144*; Potassium 4.4; Sodium 141; TSH 1.676   Wt Readings from Last 3 Encounters:  11/25/15 195 lb (88.451 kg)  11/16/15 193 lb 14.4 oz (87.952 kg)  11/10/15 201 lb 14.4 oz (91.581 kg)     Other studies Reviewed: Additional studies/ records that were reviewed today include: hospital records   Assessment and Plan:  1.  Ventricular tachycardia Cycle length 349msec No recurrence since discharge Continue amiodarone 200mg  daily after 6 more days of 400mg  daily. LFT's, TSH normal during hospitalization, will need to repeat in 3 months. Annual eye exams recommended. Will obtain baseline PFT's.  No driving x6 months  Reassurance given today  2.  Chronic systolic dysfunction euvolemic today Stable on an appropriate medical regimen Normal ICD function See Pace Art report No changes today  3.  ICM/CAD No recent ischemic symptoms Dr Wynonia Lawman and Dr Rayann Heman discussed patient during recent hospitalization and did not feel there was a need for ischemic eval at this time.   4.  Sinus bradycardia Patient sensitive to rate response by symptoms Rate response adjusted today to make less sensitive   Current medicines  are reviewed at length with the patient today.   The patient does not have concerns regarding his medicines.  The following changes were made today:  Decrease amiodarone to 200mg  daily   Labs/ tests ordered today include: PFT's   Disposition:   Follow up with [facility] transmissions, ICM clinic, me in 3 months   Signed, [name], NP 11/25/2015 3:30 PM  [address] [place] [address] [phone] (office) [phone] (fax)

## 2015-11-25 NOTE — Patient Instructions (Addendum)
Medication Instructions:   Your physician recommends that you continue on your current medications as directed. Please refer to the Current Medication list given to you today.   If you need a refill on your cardiac medications before your next appointment, please call your pharmacy.  Labwork:  NONE ORDER TODAY    Testing/Procedures:  Your physician has recommended that you have a pulmonary function test. Pulmonary Function Tests are a group of tests that measure how well air moves in and out of your lungs.   Follow-Up:  3 MONTHS WITH AMBER    Any Other Special Instructions Will Be Listed Below (If Applicable).

## 2015-11-26 ENCOUNTER — Ambulatory Visit (HOSPITAL_BASED_OUTPATIENT_CLINIC_OR_DEPARTMENT_OTHER): Payer: PPO | Attending: Cardiology

## 2015-11-26 VITALS — Ht 68.0 in | Wt 193.0 lb

## 2015-11-26 DIAGNOSIS — I110 Hypertensive heart disease with heart failure: Secondary | ICD-10-CM

## 2015-11-27 ENCOUNTER — Encounter: Payer: PPO | Admitting: Nurse Practitioner

## 2015-11-27 ENCOUNTER — Encounter (HOSPITAL_COMMUNITY): Payer: PPO

## 2015-11-30 ENCOUNTER — Encounter (HOSPITAL_COMMUNITY): Payer: PPO

## 2015-12-02 ENCOUNTER — Encounter (HOSPITAL_COMMUNITY)
Admission: RE | Admit: 2015-12-02 | Discharge: 2015-12-02 | Disposition: A | Discharge status: Home / Self Care | Payer: PPO | Source: Ambulatory Visit | Attending: Cardiology | Admitting: Cardiology

## 2015-12-02 DIAGNOSIS — I5023 Acute on chronic systolic (congestive) heart failure: Secondary | ICD-10-CM | POA: Insufficient documentation

## 2015-12-02 DIAGNOSIS — I208 Other forms of angina pectoris: Secondary | ICD-10-CM | POA: Diagnosis not present

## 2015-12-02 DIAGNOSIS — I2510 Atherosclerotic heart disease of native coronary artery without angina pectoris: Secondary | ICD-10-CM | POA: Insufficient documentation

## 2015-12-02 DIAGNOSIS — Z951 Presence of aortocoronary bypass graft: Secondary | ICD-10-CM | POA: Insufficient documentation

## 2015-12-04 ENCOUNTER — Encounter (HOSPITAL_COMMUNITY)
Admission: RE | Admit: 2015-12-04 | Discharge: 2015-12-04 | Disposition: A | Discharge status: Home / Self Care | Payer: PPO | Source: Ambulatory Visit | Attending: Cardiology | Admitting: Cardiology

## 2015-12-04 DIAGNOSIS — I5023 Acute on chronic systolic (congestive) heart failure: Secondary | ICD-10-CM | POA: Diagnosis not present

## 2015-12-05 DIAGNOSIS — R0683 Snoring: Secondary | ICD-10-CM | POA: Diagnosis not present

## 2015-12-05 NOTE — Progress Notes (Signed)
  Patient Name: Paul [name], Paul [name] Date: 11/26/2015 Gender: Male D.O.B: Oct 17, 1951 Age (years): 64 Referring Provider: Landry Corporal Height (inches): 17 Interpreting Physician: Baird Lyons MD, ABSM Weight (lbs): 193 RPSGT: Baxter Flattery BMI: 29 MRN: [id number] Neck Size: 16.00 CLINICAL INFORMATION Sleep Study Type: NPSG Indication for sleep study: Fatigue, Hypertension, OSA, Snoring, Witnessed Apneas Epworth Sleepiness Score: 4  SLEEP STUDY TECHNIQUE As per the AASM Manual for the Scoring of Sleep and Associated Events v2.3 (April 2016) with a hypopnea requiring 4% desaturations. The channels recorded and monitored were frontal, central and occipital EEG, electrooculogram (EOG), submentalis EMG (chin), nasal and oral airflow, thoracic and abdominal wall motion, anterior tibialis EMG, snore microphone, electrocardiogram, and pulse oximetry.  MEDICATIONS Patient's medications include: charted for review Medications self-administered by patient during sleep study : Xanax  SLEEP ARCHITECTURE The study was initiated at 11:02:24 PM and ended at 5:11:12 AM. Sleep onset time was 3.5 minutes and the sleep efficiency was 74.8%. The total sleep time was 275.8 minutes. Stage REM latency was 101.5 minutes. The patient spent 7.80% of the night in stage N1 sleep, 74.44% in stage N2 sleep, 0.00% in stage N3 and 17.77% in REM. Alpha intrusion was absent. Supine sleep was 59.03%. Wake after sleep onset 89.5 minutes  RESPIRATORY PARAMETERS The overall apnea/hypopnea index (AHI) was 0.0 per hour. There were 0 total apneas, including 0 obstructive, 0 central and 0 mixed apneas. There were 0 hypopneas and 0 RERAs. The AHI during Stage REM sleep was 0.0 per hour. AHI while supine was 0.0 per hour. The mean oxygen saturation was 91.59%. The minimum SpO2 during sleep was 78.00%. Loud snoring was noted during this study.  CARDIAC DATA The 2 lead EKG demonstrated sinus rhythm. The mean heart  rate was 50.12 beats per minute. Other EKG findings include: None.  LEG MOVEMENT DATA The total PLMS were 0 with a resulting PLMS index of 0.00. Associated arousal with leg movement index was 0.0 .  IMPRESSIONS - No significant obstructive sleep apnea occurred during this study (AHI = 0.0/h). - No significant central sleep apnea occurred during this study (CAI = 0.0/h). - Oxygen desaturation was noted during this study (Min O2 = 78.00%). - The patient snored with Loud snoring volume. - No cardiac abnormalities were noted during this study. - Clinically significant periodic limb movements did not occur during sleep. No significant associated arousals.  DIAGNOSIS - Primary Snoring (786.09 [R06.83 ICD-10]) - Normal study  RECOMMENDATIONS - Avoid alcohol, sedatives and other CNS depressants that may worsen sleep apnea and disrupt normal sleep architecture. - Sleep hygiene should be reviewed to assess factors that may improve sleep quality. - Weight management and regular exercise should be initiated or continued if appropriate.  Deneise Lever Diplomate, American Board of Sleep Medicine  ELECTRONICALLY SIGNED ON:  12/05/2015, 11:29 AM [place] PH: [phone]   FX: [phone] [place]

## 2015-12-07 ENCOUNTER — Encounter (HOSPITAL_COMMUNITY)
Admission: RE | Admit: 2015-12-07 | Discharge: 2015-12-07 | Disposition: A | Discharge status: Home / Self Care | Payer: PPO | Source: Ambulatory Visit | Attending: Cardiology | Admitting: Cardiology

## 2015-12-07 ENCOUNTER — Telehealth: Payer: Self-pay | Admitting: Cardiology

## 2015-12-07 ENCOUNTER — Ambulatory Visit (INDEPENDENT_AMBULATORY_CARE_PROVIDER_SITE_OTHER): Payer: PPO | Admitting: *Deleted

## 2015-12-07 ENCOUNTER — Telehealth: Payer: Self-pay | Admitting: Internal Medicine

## 2015-12-07 DIAGNOSIS — I472 Ventricular tachycardia, unspecified: Secondary | ICD-10-CM

## 2015-12-07 DIAGNOSIS — I5023 Acute on chronic systolic (congestive) heart failure: Secondary | ICD-10-CM | POA: Diagnosis not present

## 2015-12-07 DIAGNOSIS — I255 Ischemic cardiomyopathy: Secondary | ICD-10-CM

## 2015-12-07 NOTE — Telephone Encounter (Signed)
LMOVM informing pt that when I called earlier it was to inform him that his transmission had been received.

## 2015-12-07 NOTE — Progress Notes (Signed)
EPIC Encounter for ICM Monitoring  Patient Name: Paul [name] is a 65 y.o. male Date: 12/07/2015 Primary Care Physican: Chesley Noon, MD Primary Cardiologist: Wynonia Lawman Electrophysiologist: Allred Dry Weight: 188 lbs       In the past month, have you:  1. Gained more than 2 pounds in a day or more than 5 pounds in a week? no  2. Had changes in your medications (with verification of current medications)? no  3. Had more shortness of breath than is usual for you? no  4. Limited your activity because of shortness of breath? no  5. Not been able to sleep because of shortness of breath? no  6. Had increased swelling in your feet or ankles? no  7. Had symptoms of dehydration (dizziness, dry mouth, increased thirst, decreased urine output) no  8. Had changes in sodium restriction? no  9. Been compliant with medication? Yes   ICM trend: 3 month view    ICM trend: 1 year view   Follow-up plan: ICM clinic phone appointment on 01/12/2016.  1st ICM encounter.  Optivol thoracic impedance below reference line 11/09/2015 to 11/16/2015 suggesting fluid retention.  He denied any symptoms at that time.  Thoracic impedance returned to reference line on 11/17/2015.   Patient did have GI virus this past weekend and lost fluids but he stated he tried to stay well hydrated.  He is feeling better today and started cardiac rehab.   He reported some dizziness and he thinks it is related to his rate.  His heart rate at resting has been 48-52 and when exercising it was 62 and he requested to inform Amber is case there could be another device adjustment.  Advised I would review with [name], NP and call back if she has any recommendations. No changes today.   Reviewed patients concerns regarding dizziness related to low heart rate with [name], NP.  She stated his lower base rate is 50 and upper track rate is 130.  She stated the lower base heart rate can be increased to 60 if patient thinks he would  feel better and to have his schedule appointment with device clinic if needed.    Call back to patient to discuss [name]'s recommendations and left message for return call.     Copy of note sent to patient's primary care physician, primary cardiologist, and device following physician.  [name], RN, CCM 12/07/2015 2:08 PM   12/08/2015 Call back to patient and provided [name] NP recommendations.  Advised that base heart rate can be increased to 60 if he thinks that would make him feel better and can be done in the device clinic.  He stated for now he will keep the programming as it is and if anything changes he will make an appointment.

## 2015-12-07 NOTE — Telephone Encounter (Signed)
Returning your call. °

## 2015-12-07 NOTE — Progress Notes (Signed)
Remote ICD transmission.   

## 2015-12-07 NOTE — Telephone Encounter (Signed)
Informed pt wife that we did receive pt transmission.

## 2015-12-09 ENCOUNTER — Encounter (HOSPITAL_COMMUNITY)
Admission: RE | Admit: 2015-12-09 | Discharge: 2015-12-09 | Disposition: A | Discharge status: Home / Self Care | Payer: PPO | Source: Ambulatory Visit | Attending: Cardiology | Admitting: Cardiology

## 2015-12-09 ENCOUNTER — Encounter: Payer: Self-pay | Admitting: Cardiology

## 2015-12-09 DIAGNOSIS — I5023 Acute on chronic systolic (congestive) heart failure: Secondary | ICD-10-CM | POA: Diagnosis not present

## 2015-12-09 LAB — CUP PACEART REMOTE DEVICE CHECK
Brady Statistic AS VS Percent: 0.1 %
Brady Statistic RA Percent Paced: 49.2 %
Brady Statistic RV Percent Paced: 0.1 %
Date Time Interrogation Session: 20170215083742
HIGH POWER IMPEDANCE MEASURED VALUE: 79 Ohm
Implantable Lead Implant Date: 20151008
Implantable Lead Location: 753859
Implantable Lead Model: 6935
Lead Channel Pacing Threshold Amplitude: 1.125 V
Lead Channel Sensing Intrinsic Amplitude: 2.4 mV
Lead Channel Setting Pacing Amplitude: 2 V
Lead Channel Setting Pacing Amplitude: 2.5 V
MDC IDC LEAD IMPLANT DT: 20151008
MDC IDC LEAD LOCATION: 753860
MDC IDC MSMT LEADCHNL RA IMPEDANCE VALUE: 475 Ohm
MDC IDC MSMT LEADCHNL RA PACING THRESHOLD PULSEWIDTH: 0.4 ms
MDC IDC MSMT LEADCHNL RV IMPEDANCE VALUE: 342 Ohm
MDC IDC MSMT LEADCHNL RV PACING THRESHOLD AMPLITUDE: 1.125 V
MDC IDC MSMT LEADCHNL RV PACING THRESHOLD PULSEWIDTH: 0.4 ms
MDC IDC MSMT LEADCHNL RV SENSING INTR AMPL: 10.3 mV
MDC IDC SET LEADCHNL RV PACING PULSEWIDTH: 0.4 ms
MDC IDC SET LEADCHNL RV SENSING SENSITIVITY: 0.3 mV

## 2015-12-11 ENCOUNTER — Encounter (HOSPITAL_COMMUNITY)
Admission: RE | Admit: 2015-12-11 | Discharge: 2015-12-11 | Disposition: A | Discharge status: Home / Self Care | Payer: PPO | Source: Ambulatory Visit | Attending: Cardiology | Admitting: Cardiology

## 2015-12-11 DIAGNOSIS — G4733 Obstructive sleep apnea (adult) (pediatric): Secondary | ICD-10-CM | POA: Diagnosis not present

## 2015-12-11 DIAGNOSIS — E785 Hyperlipidemia, unspecified: Secondary | ICD-10-CM | POA: Diagnosis not present

## 2015-12-11 DIAGNOSIS — I255 Ischemic cardiomyopathy: Secondary | ICD-10-CM | POA: Diagnosis not present

## 2015-12-11 DIAGNOSIS — Z951 Presence of aortocoronary bypass graft: Secondary | ICD-10-CM | POA: Diagnosis not present

## 2015-12-11 DIAGNOSIS — I2510 Atherosclerotic heart disease of native coronary artery without angina pectoris: Secondary | ICD-10-CM | POA: Diagnosis not present

## 2015-12-11 DIAGNOSIS — I5023 Acute on chronic systolic (congestive) heart failure: Secondary | ICD-10-CM | POA: Diagnosis not present

## 2015-12-11 DIAGNOSIS — Z95810 Presence of automatic (implantable) cardiac defibrillator: Secondary | ICD-10-CM | POA: Diagnosis not present

## 2015-12-11 DIAGNOSIS — I472 Ventricular tachycardia: Secondary | ICD-10-CM | POA: Diagnosis not present

## 2015-12-14 ENCOUNTER — Encounter (HOSPITAL_COMMUNITY)
Admission: RE | Admit: 2015-12-14 | Discharge: 2015-12-14 | Disposition: A | Discharge status: Home / Self Care | Payer: PPO | Source: Ambulatory Visit | Attending: Cardiology | Admitting: Cardiology

## 2015-12-14 DIAGNOSIS — I5023 Acute on chronic systolic (congestive) heart failure: Secondary | ICD-10-CM | POA: Diagnosis not present

## 2015-12-16 ENCOUNTER — Encounter (HOSPITAL_COMMUNITY)
Admission: RE | Admit: 2015-12-16 | Discharge: 2015-12-16 | Disposition: A | Discharge status: Home / Self Care | Payer: PPO | Source: Ambulatory Visit | Attending: Cardiology | Admitting: Cardiology

## 2015-12-16 DIAGNOSIS — I5023 Acute on chronic systolic (congestive) heart failure: Secondary | ICD-10-CM | POA: Diagnosis not present

## 2015-12-16 NOTE — Progress Notes (Signed)
Cardiac Rehabilitation Program Outcomes Report   Orientation:  11/10/15 Graduate Date:  tbd Discharge Date:  tbd # of sessions completed: 3  Cardiologist: Rosalita Chessman MD:  Eustace Pen Time:  1100  A.  Exercise Program:  Tolerates exercise @ 2.15 METS for 15 minutes and Walk Test Results:  Pre: 2.88 mets  B.  Mental Health:  Good mental attitude and PHQ-9: 11.  states will let his PCP know if he decides to talk with counselor.   C.  Education/Instruction/Skills  Accurately checks own pulse.  Rest:  55  Exercise:  69  Uses Perceived Exertion Scale and/or Dyspnea Scale  D.  Nutrition/Weight Control/Body Composition:  Adherence to prescribed nutrition program: fair    E.  Blood Lipids    Lab Results  Component Value Date   CHOL 137 07/30/2014   HDL 40 07/30/2014   LDLCALC 80 07/30/2014   TRIG 87 07/30/2014   CHOLHDL 3.4 07/30/2014    F.  Lifestyle Changes:  Making positive lifestyle changes and Not smoking:  Quit NEVER smoker  G.  Symptoms noted with exercise:  Asymptomatic  Report Completed By:  Stevphen Rochester RN   Comments:  This is the patients first week progress note for AP CR.

## 2015-12-17 ENCOUNTER — Encounter: Payer: Self-pay | Admitting: Pulmonary Disease

## 2015-12-17 ENCOUNTER — Ambulatory Visit (INDEPENDENT_AMBULATORY_CARE_PROVIDER_SITE_OTHER): Payer: PPO | Admitting: Pulmonary Disease

## 2015-12-17 VITALS — BP 120/68 | HR 61 | Ht 68.0 in | Wt 193.2 lb

## 2015-12-17 DIAGNOSIS — G4719 Other hypersomnia: Secondary | ICD-10-CM

## 2015-12-17 DIAGNOSIS — I255 Ischemic cardiomyopathy: Secondary | ICD-10-CM

## 2015-12-17 DIAGNOSIS — G4733 Obstructive sleep apnea (adult) (pediatric): Secondary | ICD-10-CM

## 2015-12-17 DIAGNOSIS — E669 Obesity, unspecified: Secondary | ICD-10-CM | POA: Insufficient documentation

## 2015-12-17 DIAGNOSIS — G4710 Hypersomnia, unspecified: Secondary | ICD-10-CM | POA: Insufficient documentation

## 2015-12-17 NOTE — Assessment & Plan Note (Signed)
Plan for home sleep study.

## 2015-12-17 NOTE — Assessment & Plan Note (Signed)
Weight reduction 

## 2015-12-17 NOTE — Progress Notes (Signed)
Subjective:    Patient ID: Paul [name], male    DOB: 1951/03/06, 65 y.o.   MRN: [id number]  HPI  This is the case of Paul [name], 65 y.o. Male, who was referred by Dr. Landry Corporal  in consultation regarding hypersomnia.   As you very well know, patient was diagnosed with sleep apnea in 2002. He used CPAP but only for a couple months. He was not tolerant to the mask. Through the years, his symptoms worsen.  He has hypersomnia, fatigue, frequent awakening, witnessed apneas, gasping, choking.  Patient ended up getting a mouth guard from a dentist for several years but these have not helped the last couple years. His symptoms have gotten worse. Hypersomnia affects functionality.  Patient has cardiac issues. He has arrhythmia and congestive heart failure. Difficult to control arrhythmia. A sleep study was done recently which was normal.  Persistence of symptoms prompted consult.  Review of Systems  Constitutional: Negative for fever and unexpected weight change.       Lost 15 lbs -- intentional.   HENT: Negative for congestion, dental problem, ear pain, nosebleeds, postnasal drip, rhinorrhea, sinus pressure, sneezing, sore throat and trouble swallowing.   Eyes: Negative for redness and itching.  Respiratory: Negative for cough, chest tightness, shortness of breath and wheezing.   Cardiovascular: Positive for palpitations. Negative for leg swelling.  Gastrointestinal: Negative for nausea and vomiting.  Genitourinary: Negative for dysuria.  Musculoskeletal: Negative for joint swelling.  Skin: Negative for rash.  Neurological: Negative for headaches.  Hematological: Bruises/bleeds easily.  Psychiatric/Behavioral: Negative for dysphoric mood. The patient is not nervous/anxious.   All other systems reviewed and are negative.  Past Medical History  Diagnosis Date  . CAD (coronary artery disease)     widely patent LAD LIMA graft, widely patent PDA PLR SVG, occluded Diag 1 OM 1 SVG  by cardiac CT 2007   . Ischemic cardiomyopathy     Prior inferior infarct EF 35% by ECHO 07/13/11    . Depression   . S/P CABG (coronary artery bypass graft)     CABG 06/1998 [place], [place] with LIMA to LAD, SVG to OM, SVG to PD-PL.   OM graft occluded 10/12/2005 by CTA   . Sleep apnea   . Hyperlipidemia   . GERD (gastroesophageal reflux disease)   . Ventricular tachycardia ([place]) 10/15    VT at 188 bpm- unstable requiring external defibrillation  . IBS (irritable bowel syndrome)   . BPH (benign prostatic hyperplasia)    (-) asthma, copd. (-) Lung CA, DVT  Family History  Problem Relation Age of Onset  . Cerebral aneurysm Father   . Hepatitis Brother   . Diabetes Neg Hx   . Heart attack Neg Hx   . Stroke Maternal Aunt   . Hypertension Neg Hx      Past Surgical History  Procedure Laterality Date  . Coronary artery bypass graft  1999  . Appendectomy    . Left heart catheterization with coronary/graft angiogram N/A 07/30/2014    Procedure: LEFT HEART CATHETERIZATION WITH Beatrix Fetters;  Surgeon: Jacolyn Reedy, MD;  Location[facility];  Service: Cardiovascular;  Laterality: N/A;  . Implantable cardioverter defibrillator implant N/A 07/31/2014    MDT Evalyn Casco DR ICD implanted by Dr Rayann Heman for secondary treatent of VT    Social History   Social History  . Marital Status: Married    Spouse Name: N/A  . Number of Children: N/A  . Years of Education:  N/A   Occupational History  . Not on file.   Social History Main Topics  . Smoking status: Never Smoker   . Smokeless tobacco: Never Used  . Alcohol Use: 1.2 oz/week    2 Glasses of wine per week  . Drug Use: No  . Sexual Activity: Not on file   Other Topics Concern  . Not on file   Social History Narrative   married --with 2 children. 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   Allergies  Allergen Reactions  . Carvedilol     Fatigue   . Escitalopram Oxalate     unknown  . Niacin  And Related     Flushing   . Spironolactone     gynecomastia     Outpatient Prescriptions Prior to Visit  Medication Sig Dispense Refill  . amiodarone (PACERONE) 400 MG tablet Take 400mg  twice daily for 1 week then 400mg  daily for 1 week then 200mg  daily (Patient taking differently: Take 400 mg by mouth daily. Take 400mg  twice daily for 1 week then 400mg  daily for 1 week then 200mg  daily) 120 tablet 1  . Ascorbic Acid (VITAMIN C) 1000 MG tablet Take 1,000 mg by mouth daily.    Marland Kitchen aspirin EC 81 MG tablet Take 81 mg by mouth daily.    Marland Kitchen atorvastatin (LIPITOR) 80 MG tablet Take 80 mg by mouth daily.    . cetirizine (ZYRTEC) 10 MG tablet Take 10 mg by mouth daily.    . clopidogrel (PLAVIX) 75 MG tablet Take 75 mg by mouth daily.    . Coenzyme Q10 50 MG CAPS Take 1 capsule by mouth daily.    Marland Kitchen eplerenone (INSPRA) 25 MG tablet Take 25 mg by mouth daily.    . fenofibrate 160 MG tablet Take 160 mg by mouth daily.    . folic acid (FOLVITE) Q000111Q MCG tablet Take 800 mcg by mouth daily.     . metoprolol succinate (TOPROL-XL) 100 MG 24 hr tablet Take 1 tablet (100 mg total) by mouth 2 (two) times daily. 120 tablet 3  . Multiple Vitamin (MULTIVITAMIN WITH MINERALS) TABS tablet Take 1 tablet by mouth daily.    . Omega-3 Fatty Acids (FISH OIL) 1000 MG CAPS Take 1 capsule by mouth daily.    . ramipril (ALTACE) 10 MG capsule Take 10 mg by mouth daily.    . Vitamin E 400 UNITS TABS Take 800 Units by mouth daily.    Marland Kitchen omeprazole (PRILOSEC) 20 MG capsule Take 20 mg by mouth 2 (two) times daily before a meal.     . ALPRAZolam (XANAX) 0.5 MG tablet Reported on 12/17/2015    . digoxin (LANOXIN) 0.125 MG tablet Take 0.125 mg by mouth daily. Reported on 12/17/2015    . diphenoxylate-atropine (LOMOTIL) 2.5-0.025 MG tablet Take by mouth 4 (four) times daily as needed for diarrhea or loose stools. Reported on 12/17/2015    . fluticasone (FLONASE) 50 MCG/ACT nasal spray Place 2 sprays into both nostrils daily. (Patient not  taking: Reported on 12/17/2015) 16 g 6  . hyoscyamine (LEVSIN, ANASPAZ) 0.125 MG tablet Take 0.125 mg by mouth every 4 (four) hours as needed for cramping. Reported on 12/17/2015     No facility-administered medications prior to visit.   No orders of the defined types were placed in this encounter.         Objective:   Physical Exam Vitals:  Filed Vitals:   12/17/15 1406  BP: 120/68  Pulse: 61  Height: 5\' 8"  (1.727 m)  Weight: 193 lb 3.2 oz (87.635 kg)  SpO2: 94%    Constitutional/General:  Pleasant, well-nourished, well-developed, not in any distress,  Comfortably seating.  Well kempt  Body mass index is 29.38 kg/(m^2). Wt Readings from Last 3 Encounters:  12/17/15 193 lb 3.2 oz (87.635 kg)  11/26/15 193 lb (87.544 kg)  11/25/15 195 lb (88.451 kg)    Neck circumference:  16 inches.  HEENT: Pupils equal and reactive to light and accommodation. Anicteric sclerae. Normal nasal mucosa.   No oral  lesions,  mouth clear,  oropharynx clear, no postnasal drip. (-) Oral thrush. No dental caries.  Airway - Mallampati class IV  Neck: No masses. Midline trachea. No JVD, (-) LAD. (-) bruits appreciated.  Respiratory/Chest: Grossly normal chest. (-) deformity. (-) Accessory muscle use.  Symmetric expansion. (-) Tenderness on palpation.  Resonant on percussion.  Diminished BS on both lower lung zones. (-) wheezing, crackles, rhonchi (-) egophony  Cardiovascular: Regular rate and  rhythm, heart sounds normal, no murmur or gallops, no peripheral edema  Gastrointestinal:  Normal bowel sounds. Soft, non-tender. No hepatosplenomegaly.  (-) masses.   Musculoskeletal:  Normal muscle tone. Normal gait.   Extremities: Grossly normal. (-) clubbing, cyanosis.  (-) edema  Skin: (-) rash,lesions seen.   Neurological/Psychiatric : alert, oriented to time, place, person. Normal mood and affect             Assessment & Plan:  OSA (obstructive sleep apnea) patient was  diagnosed with sleep apnea in 2002. He used CPAP but only for a couple months. He was not tolerant to the mask. Through the years, his symptoms worsen.  He has hypersomnia, fatigue, frequent awakening, witnessed apneas, gasping, choking.  Patient ended up getting a mouth guard from a dentist for several years but these have not helped the last couple years. His symptoms have gotten worse. Hypersomnia affects functionality.  Patient has cardiac issues. He has arrhythmia and congestive heart failure. Difficult to control arrhythmia. A sleep study was done recently which was normal.  Persistence of symptoms prompted consult.  Plan : 1. Need a home sleep study.  2. If sleep study is (-), will need to investigate the reason behind sx. 3. If (+), will try autocpap unless severe. May have central apneas with cardiac issues. 4. Advised on good sleep hygiene. 5. Patient not to drive when sleepy.  Ischemic cardiomyopathy Patient being followed by cardiology. If sleep study is negative, he may need oxygen at bedtime. May need PAP for cardiac issues.   Hypersomnia Plan for home sleep study.  Obesity Weight reduction    Thank you very much for letting me participate in this patient's care. Please do not hesitate to give me a call if you have any questions or concerns regarding the treatment plan.   Patient will follow up with me in 2 mos.    Monica Becton, MD [facility] [place] Pager: [phone] Office: [phone], Fax: [phone]

## 2015-12-17 NOTE — Assessment & Plan Note (Signed)
Patient being followed by cardiology. If sleep study is negative, he may need oxygen at bedtime. May need PAP for cardiac issues.

## 2015-12-17 NOTE — Assessment & Plan Note (Signed)
patient was diagnosed with sleep apnea in 2002. He used CPAP but only for a couple months. He was not tolerant to the mask. Through the years, his symptoms worsen.  He has hypersomnia, fatigue, frequent awakening, witnessed apneas, gasping, choking.  Patient ended up getting a mouth guard from a dentist for several years but these have not helped the last couple years. His symptoms have gotten worse. Hypersomnia affects functionality.  Patient has cardiac issues. He has arrhythmia and congestive heart failure. Difficult to control arrhythmia. A sleep study was done recently which was normal.  Persistence of symptoms prompted consult.  Plan : 1. Need a home sleep study.  2. If sleep study is (-), will need to investigate the reason behind sx. 3. If (+), will try autocpap unless severe. May have central apneas with cardiac issues. 4. Advised on good sleep hygiene. 5. Patient not to drive when sleepy.

## 2015-12-17 NOTE — Patient Instructions (Signed)
1. We will schedule you for a home sleep study.  2. We will call you with results.   Return to clinic in 2 months.   Monica Becton, MD [facility] Pager [phone] After 3pm or if no response, call [phone] Office: [phone], Fax: [phone]

## 2015-12-18 ENCOUNTER — Encounter (HOSPITAL_COMMUNITY)
Admission: RE | Admit: 2015-12-18 | Discharge: 2015-12-18 | Disposition: A | Discharge status: Home / Self Care | Payer: PPO | Source: Ambulatory Visit | Attending: Cardiology | Admitting: Cardiology

## 2015-12-18 DIAGNOSIS — I5023 Acute on chronic systolic (congestive) heart failure: Secondary | ICD-10-CM | POA: Diagnosis not present

## 2015-12-18 NOTE — Progress Notes (Signed)
Patient was given individual home exercise plan. Handout was reviewed and discussed with patient. Patient signed plan and verbalized an understanding.  

## 2015-12-21 ENCOUNTER — Encounter (HOSPITAL_COMMUNITY)
Admission: RE | Admit: 2015-12-21 | Discharge: 2015-12-21 | Disposition: A | Discharge status: Home / Self Care | Payer: PPO | Source: Ambulatory Visit | Attending: Cardiology | Admitting: Cardiology

## 2015-12-21 DIAGNOSIS — I5023 Acute on chronic systolic (congestive) heart failure: Secondary | ICD-10-CM | POA: Diagnosis not present

## 2015-12-23 ENCOUNTER — Encounter (HOSPITAL_COMMUNITY)
Admission: RE | Admit: 2015-12-23 | Discharge: 2015-12-23 | Disposition: A | Discharge status: Home / Self Care | Payer: PPO | Source: Ambulatory Visit | Attending: Cardiology | Admitting: Cardiology

## 2015-12-23 DIAGNOSIS — I2510 Atherosclerotic heart disease of native coronary artery without angina pectoris: Secondary | ICD-10-CM | POA: Diagnosis not present

## 2015-12-23 DIAGNOSIS — I5023 Acute on chronic systolic (congestive) heart failure: Secondary | ICD-10-CM | POA: Insufficient documentation

## 2015-12-23 DIAGNOSIS — I208 Other forms of angina pectoris: Secondary | ICD-10-CM | POA: Insufficient documentation

## 2015-12-23 DIAGNOSIS — Z951 Presence of aortocoronary bypass graft: Secondary | ICD-10-CM | POA: Diagnosis not present

## 2015-12-25 ENCOUNTER — Ambulatory Visit (INDEPENDENT_AMBULATORY_CARE_PROVIDER_SITE_OTHER): Payer: PPO | Admitting: Internal Medicine

## 2015-12-25 ENCOUNTER — Encounter (HOSPITAL_COMMUNITY)
Admission: RE | Admit: 2015-12-25 | Discharge: 2015-12-25 | Disposition: A | Discharge status: Home / Self Care | Payer: PPO | Source: Ambulatory Visit | Attending: Cardiology | Admitting: Cardiology

## 2015-12-25 DIAGNOSIS — Z79899 Other long term (current) drug therapy: Secondary | ICD-10-CM

## 2015-12-25 DIAGNOSIS — I5022 Chronic systolic (congestive) heart failure: Secondary | ICD-10-CM

## 2015-12-25 DIAGNOSIS — I255 Ischemic cardiomyopathy: Secondary | ICD-10-CM

## 2015-12-25 DIAGNOSIS — I5023 Acute on chronic systolic (congestive) heart failure: Secondary | ICD-10-CM | POA: Diagnosis not present

## 2015-12-25 DIAGNOSIS — I472 Ventricular tachycardia, unspecified: Secondary | ICD-10-CM

## 2015-12-25 LAB — PULMONARY FUNCTION TEST
DL/VA % pred: 93 %
DL/VA: 4.2 ml/min/mmHg/L
DLCO COR % PRED: 87 %
DLCO COR: 25.98 ml/min/mmHg
DLCO UNC: 27.02 ml/min/mmHg
DLCO unc % pred: 91 %
FEF 25-75 POST: 3.68 L/s
FEF 25-75 Pre: 3.72 L/sec
FEF2575-%Change-Post: -1 %
FEF2575-%PRED-PRE: 146 %
FEF2575-%Pred-Post: 144 %
FEV1-%Change-Post: 1 %
FEV1-%PRED-POST: 113 %
FEV1-%Pred-Pre: 111 %
FEV1-POST: 3.63 L
FEV1-Pre: 3.56 L
FEV1FVC-%Change-Post: 1 %
FEV1FVC-%PRED-PRE: 109 %
FEV6-%Change-Post: 0 %
FEV6-%Pred-Post: 106 %
FEV6-%Pred-Pre: 106 %
FEV6-POST: 4.32 L
FEV6-Pre: 4.33 L
FEV6FVC-%CHANGE-POST: 0 %
FEV6FVC-%PRED-POST: 105 %
FEV6FVC-%Pred-Pre: 104 %
FVC-%Change-Post: 0 %
FVC-%PRED-PRE: 101 %
FVC-%Pred-Post: 101 %
FVC-POST: 4.36 L
FVC-PRE: 4.35 L
PRE FEV1/FVC RATIO: 82 %
Post FEV1/FVC ratio: 83 %
Post FEV6/FVC ratio: 100 %
Pre FEV6/FVC Ratio: 100 %
RV % pred: 106 %
RV: 2.38 L
TLC % PRED: 107 %
TLC: 7.12 L

## 2015-12-25 NOTE — Progress Notes (Signed)
PFT done today. 12/25/2015

## 2015-12-28 ENCOUNTER — Encounter (HOSPITAL_COMMUNITY)
Admission: RE | Admit: 2015-12-28 | Discharge: 2015-12-28 | Disposition: A | Discharge status: Home / Self Care | Payer: PPO | Source: Ambulatory Visit | Attending: Cardiology | Admitting: Cardiology

## 2015-12-28 ENCOUNTER — Telehealth: Payer: Self-pay | Admitting: *Deleted

## 2015-12-28 DIAGNOSIS — I5023 Acute on chronic systolic (congestive) heart failure: Secondary | ICD-10-CM | POA: Diagnosis not present

## 2015-12-28 NOTE — Telephone Encounter (Signed)
-----   Message from Patsey Berthold, NP sent at 12/26/2015  9:47 AM EST ----- Please notify patient of normal PFT's.  Thank you

## 2015-12-29 ENCOUNTER — Encounter: Payer: Self-pay | Admitting: Internal Medicine

## 2015-12-29 LAB — HEPATIC FUNCTION PANEL: Bilirubin, Total: 0.9 mg/dL

## 2015-12-30 ENCOUNTER — Encounter (HOSPITAL_COMMUNITY)
Admission: RE | Admit: 2015-12-30 | Discharge: 2015-12-30 | Disposition: A | Discharge status: Home / Self Care | Payer: PPO | Source: Ambulatory Visit | Attending: Cardiology | Admitting: Cardiology

## 2015-12-30 DIAGNOSIS — I5023 Acute on chronic systolic (congestive) heart failure: Secondary | ICD-10-CM | POA: Diagnosis not present

## 2016-01-01 ENCOUNTER — Encounter (HOSPITAL_COMMUNITY)
Admission: RE | Admit: 2016-01-01 | Discharge: 2016-01-01 | Disposition: A | Discharge status: Home / Self Care | Payer: PPO | Source: Ambulatory Visit | Attending: Cardiology | Admitting: Cardiology

## 2016-01-01 DIAGNOSIS — I5023 Acute on chronic systolic (congestive) heart failure: Secondary | ICD-10-CM | POA: Diagnosis not present

## 2016-01-04 ENCOUNTER — Encounter (HOSPITAL_COMMUNITY)
Admission: RE | Admit: 2016-01-04 | Discharge: 2016-01-04 | Disposition: A | Discharge status: Home / Self Care | Payer: PPO | Source: Ambulatory Visit | Attending: Cardiology | Admitting: Cardiology

## 2016-01-04 DIAGNOSIS — G4733 Obstructive sleep apnea (adult) (pediatric): Secondary | ICD-10-CM | POA: Diagnosis not present

## 2016-01-04 DIAGNOSIS — I5023 Acute on chronic systolic (congestive) heart failure: Secondary | ICD-10-CM | POA: Diagnosis not present

## 2016-01-05 ENCOUNTER — Telehealth: Payer: Self-pay | Admitting: Pulmonary Disease

## 2016-01-05 ENCOUNTER — Institutional Professional Consult (permissible substitution): Payer: Self-pay | Admitting: Pulmonary Disease

## 2016-01-05 ENCOUNTER — Ambulatory Visit (INDEPENDENT_AMBULATORY_CARE_PROVIDER_SITE_OTHER): Payer: PPO

## 2016-01-05 ENCOUNTER — Telehealth: Payer: Self-pay | Admitting: Internal Medicine

## 2016-01-05 DIAGNOSIS — Z95810 Presence of automatic (implantable) cardiac defibrillator: Secondary | ICD-10-CM

## 2016-01-05 DIAGNOSIS — G4733 Obstructive sleep apnea (adult) (pediatric): Secondary | ICD-10-CM

## 2016-01-05 DIAGNOSIS — I5022 Chronic systolic (congestive) heart failure: Secondary | ICD-10-CM

## 2016-01-05 NOTE — Progress Notes (Signed)
EPIC Encounter for ICM Monitoring  Patient Name: Paul [name] is a 65 y.o. male Date: 01/05/2016 Primary Care Physican: Chesley Noon, MD Primary Cardiologist: Wynonia Lawman Electrophysiologist: Allred Dry Weight: 191 lbs   In the past month, have you:  1. Gained more than 2 pounds in a day or more than 5 pounds in a week? no  2. Had changes in your medications (with verification of current medications)? no  3. Had more shortness of breath than is usual for you? no  4. Limited your activity because of shortness of breath? no  5. Not been able to sleep because of shortness of breath? no  6. Had increased swelling in your feet or ankles? no  7. Had symptoms of dehydration (dizziness, dry mouth, increased thirst, decreased urine output) no  8. Had changes in sodium restriction? no  9. Been compliant with medication? Yes   ICM trend: 3 month view for 01/04/2016   ICM trend: 1 year view for 01/04/2016   Follow-up plan: ICM clinic phone appointment 01/14/2016.  Thoracic impedance below reference line from 12/10/2015 to 01/04/2016 suggesting fluid accumulation.  Patient denied any symptoms.  Reviewed diet can cause fluid retention and education given to limit sodium intake to < 2000 mg and fluid intake to 64 oz daily.  Reviewed heart failure symptoms.    Advised would send to Dr Wynonia Lawman and Dr Rayann Heman for review and if any recommendations will call him back.  Repeat ICM remote transmission 01/14/2016.    Copy of note sent to patient's primary care physician, primary cardiologist, and device following physician.  [name], RN, CCM 01/05/2016 4:13 PM   Call Dr Thurman Coyer office and left message for his nurse Tye Maryland.  Advised I sent a copy of note to Dr Wynonia Lawman and patient is asymptomatic but device suggests fluid retention.  Advised to call or send message if Dr Wynonia Lawman has any recommendations. Call to patient and advised have not received any recommendations and if no recommendations by  tomorrow will make a plan to repeat transmission in a couple of weeks to recheck fluid level.          Call to patient and advised would recheck fluid levels on 01/14/2016.  He has not fluid symptoms at this time.

## 2016-01-05 NOTE — Telephone Encounter (Signed)
Spoke w/ pt and informed him that his transmission was received, pt wanted results from either ICM clinic nurse or NP. Call routed to [facility] clinic nurse.

## 2016-01-05 NOTE — Telephone Encounter (Signed)
Paul [name],  I read home sleep study on this pt. He has moderate OSA. Stops breathing 24x/hr.  Pls order autocpap 5-15 cm H2O. He will need a mask fitting session. Pls order a 1 month download and compliance.   I need to see him at least 1 month after having the cpap machine. Thanks.

## 2016-01-05 NOTE — Telephone Encounter (Signed)
New Message:   Pt wants to know if you received his transmission yesterday?

## 2016-01-05 NOTE — Telephone Encounter (Signed)
Spoke with patient for ICM follow up.

## 2016-01-06 ENCOUNTER — Other Ambulatory Visit: Payer: Self-pay | Admitting: *Deleted

## 2016-01-06 ENCOUNTER — Encounter (HOSPITAL_COMMUNITY)
Admission: RE | Admit: 2016-01-06 | Discharge: 2016-01-06 | Disposition: A | Discharge status: Home / Self Care | Payer: PPO | Source: Ambulatory Visit | Attending: Cardiology | Admitting: Cardiology

## 2016-01-06 DIAGNOSIS — I5023 Acute on chronic systolic (congestive) heart failure: Secondary | ICD-10-CM | POA: Diagnosis not present

## 2016-01-06 DIAGNOSIS — G4719 Other hypersomnia: Secondary | ICD-10-CM

## 2016-01-06 NOTE — Telephone Encounter (Signed)
LMTCB

## 2016-01-07 NOTE — Telephone Encounter (Signed)
Called spoke with pt. He is aware of results and had no questions. Order placed for mask fit and CPAP. He will call back for appt once he is set up on CPAP. Nothing further needed

## 2016-01-08 ENCOUNTER — Ambulatory Visit (HOSPITAL_BASED_OUTPATIENT_CLINIC_OR_DEPARTMENT_OTHER): Payer: Self-pay

## 2016-01-08 ENCOUNTER — Encounter (HOSPITAL_COMMUNITY): Payer: PPO

## 2016-01-08 DIAGNOSIS — E785 Hyperlipidemia, unspecified: Secondary | ICD-10-CM | POA: Diagnosis not present

## 2016-01-08 DIAGNOSIS — R5383 Other fatigue: Secondary | ICD-10-CM | POA: Diagnosis not present

## 2016-01-08 LAB — LIPID PANEL
CHOLESTEROL: 131 mg/dL (ref 0–200)
HDL: 53 mg/dL (ref 35–70)
LDL Cholesterol: 64 mg/dL
Triglycerides: 71 mg/dL (ref 40–160)

## 2016-01-08 LAB — BASIC METABOLIC PANEL
BUN: 15 mg/dL (ref 4–21)
Creatinine: 1.2 mg/dL (ref <=1.3)
Glucose: 95 mg/dL
POTASSIUM: 4.6 mmol/L (ref 3.4–5.3)
SODIUM: 139 mmol/L (ref 137–147)

## 2016-01-08 LAB — TSH: TSH: 2.24 u[IU]/mL (ref <=5.90)

## 2016-01-11 ENCOUNTER — Encounter (HOSPITAL_COMMUNITY)
Admission: RE | Admit: 2016-01-11 | Discharge: 2016-01-11 | Disposition: A | Discharge status: Home / Self Care | Payer: PPO | Source: Ambulatory Visit | Attending: Cardiology | Admitting: Cardiology

## 2016-01-11 DIAGNOSIS — I5023 Acute on chronic systolic (congestive) heart failure: Secondary | ICD-10-CM | POA: Diagnosis not present

## 2016-01-11 NOTE — Progress Notes (Signed)
Cardiac Rehabilitation Program Outcomes Report   Orientation:  11/10/15 Graduate Date:  tbd Discharge Date:  tbd # of sessions completed: 18  Cardiologist: Rosalita Chessman MD:  Eustace Pen Time:  1100  A.  Exercise Program:  Tolerates exercise @ 3.58 METS for 15 minutes  B.  Mental Health:  Good mental attitude  C.  Education/Instruction/Skills  Accurately checks own pulse.  Rest:  58  Exercise:  79 and Knows THR for exercise  Uses Perceived Exertion Scale and/or Dyspnea Scale  D.  Nutrition/Weight Control/Body Composition:  Adherence to prescribed nutrition program: good    E.  Blood Lipids    Lab Results  Component Value Date   CHOL 137 07/30/2014   HDL 40 07/30/2014   LDLCALC 80 07/30/2014   TRIG 87 07/30/2014   CHOLHDL 3.4 07/30/2014    F.  Lifestyle Changes:  Making positive lifestyle changes and Not smoking:  Quit Never Smoker  G.  Symptoms noted with exercise:  Asymptomatic  Report Completed By:  Stevphen Rochester RN   Comments:  This is the patients halfway progress note for [facility].

## 2016-01-12 ENCOUNTER — Telehealth: Payer: Self-pay | Admitting: Pulmonary Disease

## 2016-01-12 NOTE — Telephone Encounter (Signed)
Called and spoke to pt. Pt states he contacted [facility] and was informed that they are not going to be able to process his CPAP order for a while because they are backed up (order placed on 01/07/16), pt also states they were rude and it was very difficult to reach a representative to speak to. Pt is requesting to change DME companies from [place] to [facility] in [place].   Dr. Corrie Dandy please advise. Thanks.

## 2016-01-12 NOTE — Telephone Encounter (Signed)
Per pt's chart we sent order to [place]. Pt given name and number. No further questions or concerns.

## 2016-01-13 ENCOUNTER — Encounter (HOSPITAL_COMMUNITY): Payer: PPO

## 2016-01-13 DIAGNOSIS — E7800 Pure hypercholesterolemia, unspecified: Secondary | ICD-10-CM | POA: Diagnosis not present

## 2016-01-13 DIAGNOSIS — R5383 Other fatigue: Secondary | ICD-10-CM | POA: Diagnosis not present

## 2016-01-13 DIAGNOSIS — M79605 Pain in left leg: Secondary | ICD-10-CM | POA: Diagnosis not present

## 2016-01-13 DIAGNOSIS — I10 Essential (primary) hypertension: Secondary | ICD-10-CM | POA: Diagnosis not present

## 2016-01-13 DIAGNOSIS — R200 Anesthesia of skin: Secondary | ICD-10-CM | POA: Diagnosis not present

## 2016-01-13 DIAGNOSIS — M79604 Pain in right leg: Secondary | ICD-10-CM | POA: Diagnosis not present

## 2016-01-13 NOTE — Telephone Encounter (Signed)
Pt called & left me a vm about this.  Requested change to [facility] or [facility] in [place].  I was able to send the original order to [facility] - they are in network with his insurance.  I called pt & made him aware.  Nothing further needed.

## 2016-01-14 ENCOUNTER — Ambulatory Visit (INDEPENDENT_AMBULATORY_CARE_PROVIDER_SITE_OTHER): Payer: PPO

## 2016-01-14 ENCOUNTER — Telehealth: Payer: Self-pay

## 2016-01-14 DIAGNOSIS — Z95810 Presence of automatic (implantable) cardiac defibrillator: Secondary | ICD-10-CM

## 2016-01-14 DIAGNOSIS — I5022 Chronic systolic (congestive) heart failure: Secondary | ICD-10-CM

## 2016-01-14 NOTE — Telephone Encounter (Signed)
Call to Dr Melford Aase office.  Spoke with office person and requested to send latest copy of labs.  She stated she will fax now.

## 2016-01-14 NOTE — Progress Notes (Signed)
EPIC Encounter for ICM Monitoring  Patient Name: Paul [name] is a 65 y.o. male Date: 01/14/2016 Primary Care Physican: Chesley Noon, MD Primary Cardiologist: Wynonia Lawman Electrophysiologist: Allred Dry Weight: 188 lb   In the past month, have you:  1. Gained more than 2 pounds in a day or more than 5 pounds in a week? no  2. Had changes in your medications (with verification of current medications)? no  3. Had more shortness of breath than is usual for you? no  4. Limited your activity because of shortness of breath? no  5. Not been able to sleep because of shortness of breath? no  6. Had increased swelling in your feet or ankles? no  7. Had symptoms of dehydration (dizziness, dry mouth, increased thirst, decreased urine output) no  8. Had changes in sodium restriction? no  9. Been compliant with medication? Yes   ICM trend: 3 month view for 01/14/2016   ICM trend: 1 year view for 01/14/2016   Follow-up plan: ICM clinic phone appointment on 4/17.  Thoracic impedance trending on reference line suggesting stable fluid levels.  Patient denied any fluid symptoms.   Encouraged to call for any fluid symptoms.  No changes today.    Copy of note sent to patient's primary care physician, primary cardiologist, and device following physician.  [name], RN, CCM 01/14/2016 10:03 AM

## 2016-01-14 NOTE — Telephone Encounter (Signed)
Received faxed copy of lab results completed on 01/08/2016.  BUN 15, Creatinine 1.17, eGFR 65, Potassium 4.6

## 2016-01-15 ENCOUNTER — Encounter (HOSPITAL_COMMUNITY)
Admission: RE | Admit: 2016-01-15 | Discharge: 2016-01-15 | Disposition: A | Discharge status: Home / Self Care | Payer: PPO | Source: Ambulatory Visit | Attending: Cardiology | Admitting: Cardiology

## 2016-01-15 DIAGNOSIS — I5023 Acute on chronic systolic (congestive) heart failure: Secondary | ICD-10-CM | POA: Diagnosis not present

## 2016-01-15 DIAGNOSIS — E538 Deficiency of other specified B group vitamins: Secondary | ICD-10-CM | POA: Diagnosis not present

## 2016-01-18 ENCOUNTER — Institutional Professional Consult (permissible substitution): Payer: Self-pay | Admitting: Internal Medicine

## 2016-01-18 ENCOUNTER — Encounter (HOSPITAL_COMMUNITY)
Admission: RE | Admit: 2016-01-18 | Discharge: 2016-01-18 | Disposition: A | Discharge status: Home / Self Care | Payer: PPO | Source: Ambulatory Visit | Attending: Cardiology | Admitting: Cardiology

## 2016-01-18 DIAGNOSIS — I5023 Acute on chronic systolic (congestive) heart failure: Secondary | ICD-10-CM | POA: Diagnosis not present

## 2016-01-18 DIAGNOSIS — G4733 Obstructive sleep apnea (adult) (pediatric): Secondary | ICD-10-CM | POA: Diagnosis not present

## 2016-01-20 ENCOUNTER — Encounter (HOSPITAL_COMMUNITY): Payer: PPO

## 2016-01-22 ENCOUNTER — Encounter (HOSPITAL_COMMUNITY)
Admission: RE | Admit: 2016-01-22 | Discharge: 2016-01-22 | Disposition: A | Discharge status: Home / Self Care | Payer: PPO | Source: Ambulatory Visit | Attending: Cardiology | Admitting: Cardiology

## 2016-01-22 DIAGNOSIS — I5023 Acute on chronic systolic (congestive) heart failure: Secondary | ICD-10-CM | POA: Diagnosis not present

## 2016-01-22 DIAGNOSIS — E538 Deficiency of other specified B group vitamins: Secondary | ICD-10-CM | POA: Diagnosis not present

## 2016-01-25 ENCOUNTER — Encounter (HOSPITAL_COMMUNITY)
Admission: RE | Admit: 2016-01-25 | Discharge: 2016-01-25 | Disposition: A | Discharge status: Home / Self Care | Payer: PPO | Source: Ambulatory Visit | Attending: Cardiology | Admitting: Cardiology

## 2016-01-25 DIAGNOSIS — I5023 Acute on chronic systolic (congestive) heart failure: Secondary | ICD-10-CM | POA: Insufficient documentation

## 2016-01-25 DIAGNOSIS — Z951 Presence of aortocoronary bypass graft: Secondary | ICD-10-CM | POA: Diagnosis not present

## 2016-01-25 DIAGNOSIS — I2510 Atherosclerotic heart disease of native coronary artery without angina pectoris: Secondary | ICD-10-CM | POA: Diagnosis not present

## 2016-01-25 DIAGNOSIS — I208 Other forms of angina pectoris: Secondary | ICD-10-CM | POA: Insufficient documentation

## 2016-01-27 ENCOUNTER — Encounter (HOSPITAL_COMMUNITY)
Admission: RE | Admit: 2016-01-27 | Discharge: 2016-01-27 | Disposition: A | Discharge status: Home / Self Care | Payer: PPO | Source: Ambulatory Visit | Attending: Cardiology | Admitting: Cardiology

## 2016-01-27 DIAGNOSIS — I5023 Acute on chronic systolic (congestive) heart failure: Secondary | ICD-10-CM | POA: Diagnosis not present

## 2016-01-29 ENCOUNTER — Encounter (HOSPITAL_COMMUNITY)
Admission: RE | Admit: 2016-01-29 | Discharge: 2016-01-29 | Disposition: A | Discharge status: Home / Self Care | Payer: PPO | Source: Ambulatory Visit | Attending: Cardiology | Admitting: Cardiology

## 2016-01-29 DIAGNOSIS — I5023 Acute on chronic systolic (congestive) heart failure: Secondary | ICD-10-CM | POA: Diagnosis not present

## 2016-01-29 DIAGNOSIS — E538 Deficiency of other specified B group vitamins: Secondary | ICD-10-CM | POA: Diagnosis not present

## 2016-02-01 ENCOUNTER — Encounter (HOSPITAL_COMMUNITY)
Admission: RE | Admit: 2016-02-01 | Discharge: 2016-02-01 | Disposition: A | Discharge status: Home / Self Care | Payer: PPO | Source: Ambulatory Visit | Attending: Cardiology | Admitting: Cardiology

## 2016-02-01 DIAGNOSIS — I5023 Acute on chronic systolic (congestive) heart failure: Secondary | ICD-10-CM | POA: Diagnosis not present

## 2016-02-02 ENCOUNTER — Other Ambulatory Visit (HOSPITAL_BASED_OUTPATIENT_CLINIC_OR_DEPARTMENT_OTHER): Payer: Self-pay

## 2016-02-03 ENCOUNTER — Encounter (HOSPITAL_COMMUNITY)
Admission: RE | Admit: 2016-02-03 | Discharge: 2016-02-03 | Disposition: A | Discharge status: Home / Self Care | Payer: PPO | Source: Ambulatory Visit | Attending: Cardiology | Admitting: Cardiology

## 2016-02-03 DIAGNOSIS — I5023 Acute on chronic systolic (congestive) heart failure: Secondary | ICD-10-CM | POA: Diagnosis not present

## 2016-02-05 ENCOUNTER — Encounter (HOSPITAL_COMMUNITY): Payer: PPO

## 2016-02-08 ENCOUNTER — Encounter (HOSPITAL_COMMUNITY)
Admission: RE | Admit: 2016-02-08 | Discharge: 2016-02-08 | Disposition: A | Discharge status: Home / Self Care | Payer: PPO | Source: Ambulatory Visit | Attending: Cardiology | Admitting: Cardiology

## 2016-02-08 ENCOUNTER — Telehealth: Payer: Self-pay | Admitting: Cardiology

## 2016-02-08 ENCOUNTER — Ambulatory Visit (INDEPENDENT_AMBULATORY_CARE_PROVIDER_SITE_OTHER): Payer: PPO

## 2016-02-08 DIAGNOSIS — I5023 Acute on chronic systolic (congestive) heart failure: Secondary | ICD-10-CM | POA: Diagnosis not present

## 2016-02-08 DIAGNOSIS — Z95810 Presence of automatic (implantable) cardiac defibrillator: Secondary | ICD-10-CM

## 2016-02-08 DIAGNOSIS — I5022 Chronic systolic (congestive) heart failure: Secondary | ICD-10-CM | POA: Diagnosis not present

## 2016-02-08 NOTE — Telephone Encounter (Signed)
Spoke with pt and reminded pt of remote transmission that is due today. Pt verbalized understanding.   

## 2016-02-08 NOTE — Progress Notes (Signed)
EPIC Encounter for ICM Monitoring  Patient Name: Paul [name] is a 65 y.o. male Date: 02/08/2016 Primary Care Physican: Chesley Noon, MD Primary Cardiologist: Wynonia Lawman Electrophysiologist: Allred Dry Weight: 186 lbs   In the past month, have you:  1. Gained more than 2 pounds in a day or more than 5 pounds in a week? no  2. Had changes in your medications (with verification of current medications)? no  3. Had more shortness of breath than is usual for you? no  4. Limited your activity because of shortness of breath? no  5. Not been able to sleep because of shortness of breath? no  6. Had increased swelling in your feet or ankles? no  7. Had symptoms of dehydration (dizziness, dry mouth, increased thirst, decreased urine output) no  8. Had changes in sodium restriction? no  9. Been compliant with medication? Yes   ICM trend: 3 month view for 02/08/2016   ICM trend: 1 year view for 02/08/2016   Follow-up plan: ICM clinic phone appointment on 03/10/2016.  Thoracic impedance below reference line from 01/15/2016 to 01/29/2016 suggesting fluid accumulation and returned to reference line 02/01/2016.  Patient denied fluid symptoms of and reviewed symptoms such as weight gain, SOB and/or lower extremity swelling.  He stated he may have eaten out at restaurant during that time.  Advised that restaurants foods are usually high in sodium.  Education given to limit sodium intake to < 2000 mg and fluid intake to 64 oz daily.  Encouraged to call for any fluid symptoms.  No changes today.     [name], RN, CCM 02/08/2016 3:39 PM

## 2016-02-09 DIAGNOSIS — E538 Deficiency of other specified B group vitamins: Secondary | ICD-10-CM | POA: Diagnosis not present

## 2016-02-10 ENCOUNTER — Encounter (HOSPITAL_COMMUNITY)
Admission: RE | Admit: 2016-02-10 | Discharge: 2016-02-10 | Disposition: A | Discharge status: Home / Self Care | Payer: PPO | Source: Ambulatory Visit | Attending: Cardiology | Admitting: Cardiology

## 2016-02-10 DIAGNOSIS — I5023 Acute on chronic systolic (congestive) heart failure: Secondary | ICD-10-CM | POA: Diagnosis not present

## 2016-02-12 ENCOUNTER — Encounter (HOSPITAL_COMMUNITY)
Admission: RE | Admit: 2016-02-12 | Discharge: 2016-02-12 | Disposition: A | Discharge status: Home / Self Care | Payer: PPO | Source: Ambulatory Visit | Attending: Cardiology | Admitting: Cardiology

## 2016-02-12 DIAGNOSIS — I5023 Acute on chronic systolic (congestive) heart failure: Secondary | ICD-10-CM | POA: Diagnosis not present

## 2016-02-15 ENCOUNTER — Encounter (HOSPITAL_COMMUNITY): Payer: PPO

## 2016-02-17 ENCOUNTER — Encounter: Payer: Self-pay | Admitting: Pulmonary Disease

## 2016-02-17 ENCOUNTER — Telehealth: Payer: Self-pay | Admitting: Pulmonary Disease

## 2016-02-17 ENCOUNTER — Ambulatory Visit (INDEPENDENT_AMBULATORY_CARE_PROVIDER_SITE_OTHER): Payer: PPO | Admitting: Pulmonary Disease

## 2016-02-17 ENCOUNTER — Encounter (HOSPITAL_COMMUNITY)
Admission: RE | Admit: 2016-02-17 | Discharge: 2016-02-17 | Disposition: A | Discharge status: Home / Self Care | Payer: PPO | Source: Ambulatory Visit | Attending: Cardiology | Admitting: Cardiology

## 2016-02-17 VITALS — BP 108/62 | HR 54 | Ht 68.0 in | Wt 189.0 lb

## 2016-02-17 DIAGNOSIS — E669 Obesity, unspecified: Secondary | ICD-10-CM

## 2016-02-17 DIAGNOSIS — I5023 Acute on chronic systolic (congestive) heart failure: Secondary | ICD-10-CM | POA: Diagnosis not present

## 2016-02-17 DIAGNOSIS — I255 Ischemic cardiomyopathy: Secondary | ICD-10-CM | POA: Diagnosis not present

## 2016-02-17 DIAGNOSIS — G4730 Sleep apnea, unspecified: Secondary | ICD-10-CM

## 2016-02-17 NOTE — Progress Notes (Signed)
Subjective:    Patient ID: Paul [name], male    DOB: Jan 14, 1951, 65 y.o.   MRN: [id number]  HPI  This is the case of Paul [name], 65 y.o. Male, who was referred by Dr. Landry Corporal  in consultation regarding hypersomnia.   As you very well know, patient was diagnosed with sleep apnea in 2002. He used CPAP but only for a couple months. He was not tolerant to the mask. Through the years, his symptoms worsen.  He has hypersomnia, fatigue, frequent awakening, witnessed apneas, gasping, choking.  Patient ended up getting a mouth guard from a dentist for several years but these have not helped the last couple years. His symptoms have gotten worse. Hypersomnia affects functionality.  Patient has cardiac issues. He has arrhythmia and congestive heart failure. Difficult to control arrhythmia. A sleep study was done recently which was normal.  Persistence of symptoms prompted consult.  ROV (02/17/2016)  Pt was dxed with moderate OSA with AHI of 24. Received CPAP machine. Feels better using it. More energy. Less sleepiness. Download for the first month showed 93% compliance, AHI of 5. Having mask issues. Has anxiety as well.    Review of Systems  Constitutional: Negative for fever and unexpected weight change.       Lost 15 lbs -- intentional.   HENT: Negative for congestion, dental problem, ear pain, nosebleeds, postnasal drip, rhinorrhea, sinus pressure, sneezing, sore throat and trouble swallowing.   Eyes: Negative for redness and itching.  Respiratory: Negative for cough, chest tightness, shortness of breath and wheezing.   Cardiovascular: Positive for palpitations. Negative for leg swelling.  Gastrointestinal: Negative for nausea and vomiting.  Genitourinary: Negative for dysuria.  Musculoskeletal: Negative for joint swelling.  Skin: Negative for rash.  Neurological: Negative for headaches.  Hematological: Bruises/bleeds easily.  Psychiatric/Behavioral: Negative for dysphoric mood.  The patient is not nervous/anxious.   All other systems reviewed and are negative.  Past Medical History  Diagnosis Date  . CAD (coronary artery disease)     widely patent LAD LIMA graft, widely patent PDA PLR SVG, occluded Diag 1 OM 1 SVG by cardiac CT 2007   . Ischemic cardiomyopathy     Prior inferior infarct EF 35% by ECHO 07/13/11    . Depression   . S/P CABG (coronary artery bypass graft)     CABG 06/1998 [place], [place] with LIMA to LAD, SVG to OM, SVG to PD-PL.   OM graft occluded 10/12/2005 by CTA   . Sleep apnea   . Hyperlipidemia   . GERD (gastroesophageal reflux disease)   . Ventricular tachycardia ([place]) 10/15    VT at 188 bpm- unstable requiring external defibrillation  . IBS (irritable bowel syndrome)   . BPH (benign prostatic hyperplasia)    (-) asthma, copd. (-) Lung CA, DVT  Family History  Problem Relation Age of Onset  . Cerebral aneurysm Father   . Hepatitis Brother   . Diabetes Neg Hx   . Heart attack Neg Hx   . Stroke Maternal Aunt   . Hypertension Neg Hx      Past Surgical History  Procedure Laterality Date  . Coronary artery bypass graft  1999  . Appendectomy    . Left heart catheterization with coronary/graft angiogram N/A 07/30/2014    Procedure: LEFT HEART CATHETERIZATION WITH Beatrix Fetters;  Surgeon: Jacolyn Reedy, MD;  Location: [facility] CATH LAB;  Service: Cardiovascular;  Laterality: N/A;  . Implantable cardioverter defibrillator implant N/A 07/31/2014  MDT Evalyn Casco DR ICD implanted by Dr Rayann Heman for secondary treatent of VT    Social History   Social History  . Marital Status: Married    Spouse Name: N/A  . Number of Children: N/A  . Years of Education: N/A   Occupational History  . Not on file.   Social History Main Topics  . Smoking status: Never Smoker   . Smokeless tobacco: Never Used  . Alcohol Use: 1.2 oz/week    2 Glasses of wine per week  . Drug Use: No  . Sexual Activity: Not on file   Other Topics Concern  . Not  on file   Social History Narrative   married --with 2 children. 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   Allergies  Allergen Reactions  . Carvedilol     Fatigue   . Escitalopram Oxalate     unknown  . Niacin And Related     Flushing   . Spironolactone     gynecomastia     Outpatient Prescriptions Prior to Visit  Medication Sig Dispense Refill  . amiodarone (PACERONE) 400 MG tablet Take 400mg  twice daily for 1 week then 400mg  daily for 1 week then 200mg  daily (Patient taking differently: Take 200 mg by mouth daily. Take 400mg  twice daily for 1 week then 400mg  daily for 1 week then 200mg  daily) 120 tablet 1  . Ascorbic Acid (VITAMIN C) 1000 MG tablet Take 1,000 mg by mouth daily.    Marland Kitchen aspirin EC 81 MG tablet Take 81 mg by mouth daily.    Marland Kitchen atorvastatin (LIPITOR) 80 MG tablet Take 80 mg by mouth daily.    . cetirizine (ZYRTEC) 10 MG tablet Take 10 mg by mouth daily.    . clopidogrel (PLAVIX) 75 MG tablet Take 75 mg by mouth daily.    . Coenzyme Q10 50 MG CAPS Take 1 capsule by mouth daily.    Marland Kitchen eplerenone (INSPRA) 25 MG tablet Take 25 mg by mouth daily.    . fenofibrate 160 MG tablet Take 160 mg by mouth daily.    . folic acid (FOLVITE) Q000111Q MCG tablet Take 800 mcg by mouth daily.     . metoprolol succinate (TOPROL-XL) 100 MG 24 hr tablet Take 1 tablet (100 mg total) by mouth 2 (two) times daily. (Patient taking differently: Take 100 mg by mouth 2 (two) times daily. TAKING 100MG  IN MORNING AND 50MG  AT NIGHT) 120 tablet 3  . Multiple Vitamin (MULTIVITAMIN WITH MINERALS) TABS tablet Take 1 tablet by mouth daily.    . Omega-3 Fatty Acids (FISH OIL) 1000 MG CAPS Take 1 capsule by mouth daily.    . ramipril (ALTACE) 10 MG capsule Take 10 mg by mouth daily.    . Vitamin E 400 UNITS TABS Take 800 Units by mouth daily.    Marland Kitchen omeprazole (PRILOSEC) 20 MG capsule Take 20 mg by mouth 2 (two) times daily before a meal.      No facility-administered medications prior to  visit.   No orders of the defined types were placed in this encounter.         Objective:   Physical Exam Vitals:  Filed Vitals:   02/17/16 1359  BP: 108/62  Pulse: 54  Height: 5\' 8"  (1.727 m)  Weight: 189 lb (85.73 kg)  SpO2: 94%    Constitutional/General:  Pleasant, well-nourished, well-developed, not in any distress,  Comfortably seating.  Well kempt  Body mass index is 28.74  kg/(m^2). Wt Readings from Last 3 Encounters:  02/17/16 189 lb (85.73 kg)  12/17/15 193 lb 3.2 oz (87.635 kg)  11/26/15 193 lb (87.544 kg)    Neck circumference:  16 inches.  HEENT: Pupils equal and reactive to light and accommodation. Anicteric sclerae. Normal nasal mucosa.   No oral  lesions,  mouth clear,  oropharynx clear, no postnasal drip. (-) Oral thrush. No dental caries.  Airway - Mallampati class IV  Neck: No masses. Midline trachea. No JVD, (-) LAD. (-) bruits appreciated.  Respiratory/Chest: Grossly normal chest. (-) deformity. (-) Accessory muscle use.  Symmetric expansion. (-) Tenderness on palpation.  Resonant on percussion.  Diminished BS on both lower lung zones. (-) wheezing, crackles, rhonchi (-) egophony  Cardiovascular: Regular rate and  rhythm, heart sounds normal, no murmur or gallops, no peripheral edema  Gastrointestinal:  Normal bowel sounds. Soft, non-tender. No hepatosplenomegaly.  (-) masses.   Musculoskeletal:  Normal muscle tone. Normal gait.   Extremities: Grossly normal. (-) clubbing, cyanosis.  (-) edema  Skin: (-) rash,lesions seen.   Neurological/Psychiatric : alert, oriented to time, place, person. Normal mood and affect             Assessment & Plan:  Sleep apnea patient was diagnosed with obstructive sleep apnea in 2002. He used CPAP but only for a couple months. He was not tolerant to the mask. Through the years, his symptoms worsen.  Patient has cardiac issues. He has arrhythmia and congestive heart failure. Difficult to control  arrhythmia. A sleep study was done recently which was normal.  HST (11/2015) AHI 24. autocpap 5-15 (12/2015)  Plan : 1. Good compliance and OSA corrected. Pt feels benefit of cpap use. More energy. Less sleepiness. 2. Has mask issues. We discussed re: Amara mask or Cloth mask or nasal mask or pillows. Pt will call DME. We extensively discussed different mask options.  3. Will need noc ox on cpap and RA. May need o2 2/2 CHF. 4. Cont autocpap.    Obesity Weight reduction    Ischemic cardiomyopathy Patient being followed by cardiology. Cont cpap. We will order Noc ox on cpap and RA > likely will need O2.      Return to clinic in 31mos.   I spent at least 25 minutes with the patient today and more than 50% was spent counseling the patient regarding disease and management and facilitating labs and medications.         Monica Becton, MD [facility] [place] Pager: [phone] Office: [phone], Fax: [phone]

## 2016-02-17 NOTE — Patient Instructions (Signed)
1. Cont with autocpap. 2. We will order an overnight oximetry. 3. Call your cpap supplier re: mask. Call us back if with issues.   Return to clinic in 66mos.

## 2016-02-17 NOTE — Assessment & Plan Note (Addendum)
Patient being followed by cardiology. Cont cpap. We will order Noc ox on cpap and RA > likely will need O2.

## 2016-02-17 NOTE — Assessment & Plan Note (Addendum)
patient was diagnosed with obstructive sleep apnea in 2002. He used CPAP but only for a couple months. He was not tolerant to the mask. Through the years, his symptoms worsen.  Patient has cardiac issues. He has arrhythmia and congestive heart failure. Difficult to control arrhythmia. A sleep study was done recently which was normal.  HST (11/2015) AHI 24. autocpap 5-15 (12/2015)  Plan : 1. Good compliance and OSA corrected. Pt feels benefit of cpap use. More energy. Less sleepiness. 2. Has mask issues. We discussed re: Amara mask or Cloth mask or nasal mask or pillows. Pt will call DME. We extensively discussed different mask options.  3. Will need noc ox on cpap and RA. May need o2 2/2 CHF. 4. Cont autocpap.

## 2016-02-17 NOTE — Telephone Encounter (Signed)
Called spoke with Cylinder with [facility]. He states that a ONO was placed today at the ov with AD. He states it is unclear if he wants if the ONO should be on RA/CPAP or on oxygen. I informed him that I would speak with AD and get clarification.  Per verbal order from AD ONO needs to be on CPAP I made [facility] aware of recs. He voiced understanding and had no further questions. Nothing further needed.

## 2016-02-17 NOTE — Assessment & Plan Note (Signed)
Weight reduction 

## 2016-02-18 DIAGNOSIS — G4733 Obstructive sleep apnea (adult) (pediatric): Secondary | ICD-10-CM | POA: Diagnosis not present

## 2016-02-19 ENCOUNTER — Telehealth: Payer: Self-pay | Admitting: Pulmonary Disease

## 2016-02-19 ENCOUNTER — Encounter (HOSPITAL_COMMUNITY)
Admission: RE | Admit: 2016-02-19 | Discharge: 2016-02-19 | Disposition: A | Discharge status: Home / Self Care | Payer: PPO | Source: Ambulatory Visit | Attending: Cardiology | Admitting: Cardiology

## 2016-02-19 DIAGNOSIS — I5023 Acute on chronic systolic (congestive) heart failure: Secondary | ICD-10-CM | POA: Diagnosis not present

## 2016-02-19 NOTE — Telephone Encounter (Signed)
Patient was seen on 4/26 for CPAP compliance.  Tammy at [facility] called patient and advised him that he needs to be seen from 4/28 - Q000111Q in order to recertify his CPAP supplies.  Patient doesn't understand why he needs to be seen again when he was just here.  He wants to know if he has to come back in to be seen again and have to pay another copay to be seen.  Spoke with Melissa at [facility] and she said that she will check into this and let me know on Monday. Advised patient that Lenna Sciara is checking into it and that we will call him back on Monday.  Forwarding to [facility] for follow up.

## 2016-02-22 ENCOUNTER — Encounter (HOSPITAL_COMMUNITY)
Admission: RE | Admit: 2016-02-22 | Discharge: 2016-02-22 | Disposition: A | Discharge status: Home / Self Care | Payer: PPO | Source: Ambulatory Visit | Attending: Cardiology | Admitting: Cardiology

## 2016-02-22 DIAGNOSIS — I5023 Acute on chronic systolic (congestive) heart failure: Secondary | ICD-10-CM | POA: Diagnosis not present

## 2016-02-22 DIAGNOSIS — I2510 Atherosclerotic heart disease of native coronary artery without angina pectoris: Secondary | ICD-10-CM | POA: Insufficient documentation

## 2016-02-22 DIAGNOSIS — I208 Other forms of angina pectoris: Secondary | ICD-10-CM | POA: Diagnosis not present

## 2016-02-22 DIAGNOSIS — Z951 Presence of aortocoronary bypass graft: Secondary | ICD-10-CM | POA: Insufficient documentation

## 2016-02-22 NOTE — Telephone Encounter (Signed)
Per Melissa:  Message  Received: Today    [place], [place].  I found out that this pt got set up with his CPAP on 02/14/16. His face to face MD appt would have needed to be done between the 31st and 90th which would have been between 02/19/16 and 04/19/16. His appt with the MD was on 02/17/16 which was 2 days too early.         Patient will need to schedule another appointment before 04/19/16.  Attempted to contact patient to try to get him in for another appointment before 03/23/16 (to be safe since [place] says 6/27 and Tammy said 5/31)  Left message for patient to call back. Awaiting call back.

## 2016-02-23 NOTE — Telephone Encounter (Signed)
lmtcb for pt.  

## 2016-02-24 ENCOUNTER — Encounter (HOSPITAL_COMMUNITY)
Admission: RE | Admit: 2016-02-24 | Discharge: 2016-02-24 | Disposition: A | Discharge status: Home / Self Care | Payer: PPO | Source: Ambulatory Visit | Attending: Cardiology | Admitting: Cardiology

## 2016-02-24 DIAGNOSIS — I5023 Acute on chronic systolic (congestive) heart failure: Secondary | ICD-10-CM | POA: Diagnosis not present

## 2016-02-24 NOTE — Telephone Encounter (Signed)
LMTCB

## 2016-02-25 NOTE — Telephone Encounter (Signed)
Called spoke with pt. Scheduled pt for an ov with TP on 03/17/16. Pt voiced understanding and had no further questions.

## 2016-02-26 ENCOUNTER — Encounter (HOSPITAL_COMMUNITY)
Admission: RE | Admit: 2016-02-26 | Discharge: 2016-02-26 | Disposition: A | Discharge status: Home / Self Care | Payer: PPO | Source: Ambulatory Visit | Attending: Cardiology | Admitting: Cardiology

## 2016-02-26 DIAGNOSIS — I5023 Acute on chronic systolic (congestive) heart failure: Secondary | ICD-10-CM | POA: Diagnosis not present

## 2016-02-29 ENCOUNTER — Encounter (HOSPITAL_COMMUNITY)
Admission: RE | Admit: 2016-02-29 | Discharge: 2016-02-29 | Disposition: A | Discharge status: Home / Self Care | Payer: PPO | Source: Ambulatory Visit | Attending: Cardiology | Admitting: Cardiology

## 2016-02-29 DIAGNOSIS — I5023 Acute on chronic systolic (congestive) heart failure: Secondary | ICD-10-CM | POA: Diagnosis not present

## 2016-03-02 ENCOUNTER — Encounter (HOSPITAL_COMMUNITY)
Admission: RE | Admit: 2016-03-02 | Discharge: 2016-03-02 | Disposition: A | Discharge status: Home / Self Care | Payer: PPO | Source: Ambulatory Visit | Attending: Cardiology | Admitting: Cardiology

## 2016-03-02 DIAGNOSIS — I5023 Acute on chronic systolic (congestive) heart failure: Secondary | ICD-10-CM | POA: Diagnosis not present

## 2016-03-04 ENCOUNTER — Encounter (HOSPITAL_COMMUNITY): Payer: PPO

## 2016-03-10 ENCOUNTER — Ambulatory Visit (INDEPENDENT_AMBULATORY_CARE_PROVIDER_SITE_OTHER): Payer: PPO

## 2016-03-10 DIAGNOSIS — I5022 Chronic systolic (congestive) heart failure: Secondary | ICD-10-CM

## 2016-03-10 DIAGNOSIS — Z95810 Presence of automatic (implantable) cardiac defibrillator: Secondary | ICD-10-CM

## 2016-03-14 ENCOUNTER — Telehealth: Payer: Self-pay

## 2016-03-14 ENCOUNTER — Telehealth: Payer: Self-pay | Admitting: Nurse Practitioner

## 2016-03-14 DIAGNOSIS — E538 Deficiency of other specified B group vitamins: Secondary | ICD-10-CM | POA: Diagnosis not present

## 2016-03-14 NOTE — Telephone Encounter (Signed)
Follow-up      The pt is calling back to speak with Margarita Grizzle , about results of the pt's results from downloaded signal

## 2016-03-14 NOTE — Progress Notes (Signed)
Received call back from patient.  He stated he is doing very well.  He will continue cardiac rehab 2 days a week.  Current weight is 186 lbs which is stable.  Reviewed transmission.  He stated he is working very hard to decrease his sodium and losing weight.  No changes today.  Next ICM remote transmission 05/03/2016.  He confirmed office visit 03/30/2016.

## 2016-03-14 NOTE — Progress Notes (Signed)
EPIC Encounter for ICM Monitoring  Patient Name: Paul [name] is a 65 y.o. male Date: 03/14/2016 Primary Care Physican: Chesley Noon, MD Primary Cardiologist: Wynonia Lawman Electrophysiologist: Allred Dry Weight: unknown      In the past month, have you:  1. Gained more than 2 pounds in a day or more than 5 pounds in a week? N/A  2. Had changes in your medications (with verification of current medications)? N/A  3. Had more shortness of breath than is usual for you? N/A  4. Limited your activity because of shortness of breath? N/A  5. Not been able to sleep because of shortness of breath? N/A  6. Had increased swelling in your feet, ankles, legs or stomach area? N/A  7. Had symptoms of dehydration (dizziness, dry mouth, increased thirst, decreased urine output) N/A  8. Had changes in sodium restriction? N/A  9. Been compliant with medication? N/A  ICM trend: 3 month view for 03/11/2016   ICM trend: 1 year view for 03/11/2016   Follow-up plan: ICM clinic phone appointment 05/03/2016.  Office appointment with [name], NP 03/30/2016.   Attempted call to patient.  Transmission reviewed.  FLUID LEVELS:  Optivol thoracic impedance trending along baseline suggesting stable fluid levels.    RECOMMENDATIONS: No changes today, unable to reach patient.    [name], RN, CCM 03/14/2016 1:28 PM

## 2016-03-14 NOTE — Telephone Encounter (Signed)
Remote ICM transmission received.  Attempted patient call and left message with wife to return call.

## 2016-03-14 NOTE — Telephone Encounter (Signed)
Spoke with patient.

## 2016-03-17 ENCOUNTER — Encounter: Payer: Self-pay | Admitting: Adult Health

## 2016-03-17 ENCOUNTER — Ambulatory Visit (INDEPENDENT_AMBULATORY_CARE_PROVIDER_SITE_OTHER): Payer: PPO | Admitting: Adult Health

## 2016-03-17 VITALS — BP 118/74 | HR 54 | Temp 97.9°F | Ht 68.0 in | Wt 191.0 lb

## 2016-03-17 DIAGNOSIS — Z951 Presence of aortocoronary bypass graft: Secondary | ICD-10-CM | POA: Diagnosis not present

## 2016-03-17 DIAGNOSIS — E785 Hyperlipidemia, unspecified: Secondary | ICD-10-CM | POA: Diagnosis not present

## 2016-03-17 DIAGNOSIS — I472 Ventricular tachycardia: Secondary | ICD-10-CM | POA: Diagnosis not present

## 2016-03-17 DIAGNOSIS — I2510 Atherosclerotic heart disease of native coronary artery without angina pectoris: Secondary | ICD-10-CM | POA: Diagnosis not present

## 2016-03-17 DIAGNOSIS — I255 Ischemic cardiomyopathy: Secondary | ICD-10-CM | POA: Diagnosis not present

## 2016-03-17 DIAGNOSIS — G4733 Obstructive sleep apnea (adult) (pediatric): Secondary | ICD-10-CM

## 2016-03-17 DIAGNOSIS — Z95810 Presence of automatic (implantable) cardiac defibrillator: Secondary | ICD-10-CM | POA: Diagnosis not present

## 2016-03-17 NOTE — Assessment & Plan Note (Signed)
Moderate OSA controlled on CPAP  Will get him over to DME for mask fitting  ONO pending on CPAP   Plan  Mask fitting at DME .  Continue on CPAP At bedtime .  Goal is for at least 4-6hr each night  Do not drive if sleepy.  Follow up Dr. Murlean Iba in 4 months and As needed

## 2016-03-17 NOTE — Progress Notes (Signed)
Subjective:    Patient ID: Paul [name], male    DOB: 1951/02/03, 65 y.o.   MRN: [id number]  HPI 65 year old male patient with moderate sleep apnea   TEST   AHI of 24   03/17/2016 follow-up sleep apnea Patient returns for a one-month follow-up. Patient is on C Pap machine for moderate sleep apnea. Download shows excellent compliance with average usage at 7.21 hr . He is on a set 5-15 cm of H2O. AHI 6.0. ++leaks.  Would like to discuss mask options and leaks. Does not fit properly and leaks quite a bit. Has full face mask , has tried medium and large .  Feels better , more rested since starting. Has ONO on CPAP scheduled for next week.  Denies chest pain, orthopnea or edema .     Past Medical History  Diagnosis Date  . CAD (coronary artery disease)     widely patent LAD LIMA graft, widely patent PDA PLR SVG, occluded Diag 1 OM 1 SVG by cardiac CT 2007   . Ischemic cardiomyopathy     Prior inferior infarct EF 35% by ECHO 07/13/11    . Depression   . S/P CABG (coronary artery bypass graft)     CABG 06/1998 [place], [place] with LIMA to LAD, SVG to OM, SVG to PD-PL.   OM graft occluded 10/12/2005 by CTA   . Sleep apnea   . Hyperlipidemia   . GERD (gastroesophageal reflux disease)   . Ventricular tachycardia ([place]) 10/15    VT at 188 bpm- unstable requiring external defibrillation  . IBS (irritable bowel syndrome)   . BPH (benign prostatic hyperplasia)    Current Outpatient Prescriptions on File Prior to Visit  Medication Sig Dispense Refill  . amiodarone (PACERONE) 400 MG tablet Take 400mg  twice daily for 1 week then 400mg  daily for 1 week then 200mg  daily (Patient taking differently: Take 200 mg by mouth daily. Take 400mg  twice daily for 1 week then 400mg  daily for 1 week then 200mg  daily) 120 tablet 1  . Ascorbic Acid (VITAMIN C) 1000 MG tablet Take 1,000 mg by mouth daily.    Marland Kitchen aspirin EC 81 MG tablet Take 81 mg by mouth daily.    Marland Kitchen atorvastatin (LIPITOR) 80 MG tablet Take 80 mg by  mouth daily.    . cetirizine (ZYRTEC) 10 MG tablet Take 10 mg by mouth daily.    . clopidogrel (PLAVIX) 75 MG tablet Take 75 mg by mouth daily.    . Coenzyme Q10 50 MG CAPS Take 1 capsule by mouth daily.    Marland Kitchen eplerenone (INSPRA) 25 MG tablet Take 25 mg by mouth daily.    . fenofibrate 160 MG tablet Take 160 mg by mouth daily.    . folic acid (FOLVITE) Q000111Q MCG tablet Take 800 mcg by mouth daily.     . metoprolol succinate (TOPROL-XL) 100 MG 24 hr tablet Take 1 tablet (100 mg total) by mouth 2 (two) times daily. (Patient taking differently: Take 100 mg by mouth 2 (two) times daily. TAKING 100MG  IN MORNING AND 50MG  AT NIGHT) 120 tablet 3  . Multiple Vitamin (MULTIVITAMIN WITH MINERALS) TABS tablet Take 1 tablet by mouth daily.    . Omega-3 Fatty Acids (FISH OIL) 1000 MG CAPS Take 1 capsule by mouth daily.    Marland Kitchen omeprazole (PRILOSEC) 20 MG capsule Take 20 mg by mouth 2 (two) times daily before a meal.     . ramipril (ALTACE) 10 MG capsule Take 10 mg by  mouth daily.    . Vitamin E 400 UNITS TABS Take 800 Units by mouth daily.     No current facility-administered medications on file prior to visit.      Review of Systems Constitutional:   No  weight loss, night sweats,  Fevers, chills,  +fatigue, or  lassitude.  HEENT:   No headaches,  Difficulty swallowing,  Tooth/dental problems, or  Sore throat,                No sneezing, itching, ear ache, nasal congestion, post nasal drip,   CV:  No chest pain,  Orthopnea, PND, swelling in lower extremities, anasarca, dizziness, palpitations, syncope.   GI  No heartburn, indigestion, abdominal pain, nausea, vomiting, diarrhea, change in bowel habits, loss of appetite, bloody stools.   Resp: No shortness of breath with exertion or at rest.  No excess mucus, no productive cough,  No non-productive cough,  No coughing up of blood.  No change in color of mucus.  No wheezing.  No chest wall deformity  Skin: no rash or lesions.  GU: no dysuria, change in  color of urine, no urgency or frequency.  No flank pain, no hematuria   MS:  No joint pain or swelling.  No decreased range of motion.  No back pain.  Psych:  No change in mood or affect. No depression or anxiety.  No memory loss.         Objective:   Physical Exam  Filed Vitals:   03/17/16 1502  BP: 118/74  Pulse: 54  Temp: 97.9 F (36.6 C)  TempSrc: Oral  Height: 5\' 8"  (1.727 m)  Weight: 191 lb (86.637 kg)  SpO2: 97%    GEN: A/Ox3; pleasant , NAD, well nourished   HEENT:  [place]/AT,  EACs-clear, TMs-wnl, NOSE-clear, THROAT-clear, no lesions, no postnasal drip or exudate noted. Class 2-3 MP airway . Beard   NECK:  Supple w/ fair ROM; no JVD; normal carotid impulses w/o bruits; no thyromegaly or nodules palpated; no lymphadenopathy.  RESP  Clear  P & A; w/o, wheezes/ rales/ , rhonchi.no accessory muscle use, no dullness to percussion  CARD:  RRR, no m/r/g  , no peripheral edema, pulses intact, no cyanosis or clubbing.  GI:   Soft & nt; nml bowel sounds; no organomegaly or masses detected.  Musco: Warm bil, no deformities or joint swelling noted.   Neuro: alert, no focal deficits noted.    Skin: Warm, no lesions or rashes  Angelina Neece NP-C  [facility]  03/17/2016       Assessment & Plan:

## 2016-03-17 NOTE — Patient Instructions (Signed)
Mask fitting at DME .  Continue on CPAP At bedtime .  Goal is for at least 4-6hr each night  Do not drive if sleepy.  Follow up Dr. Murlean Iba in 4 months and As needed

## 2016-03-19 DIAGNOSIS — G4733 Obstructive sleep apnea (adult) (pediatric): Secondary | ICD-10-CM | POA: Diagnosis not present

## 2016-03-24 ENCOUNTER — Ambulatory Visit (INDEPENDENT_AMBULATORY_CARE_PROVIDER_SITE_OTHER): Payer: PPO | Admitting: Physician Assistant

## 2016-03-24 VITALS — BP 122/74 | HR 54 | Temp 97.9°F | Resp 16 | Ht 68.0 in | Wt 190.8 lb

## 2016-03-24 DIAGNOSIS — L720 Epidermal cyst: Secondary | ICD-10-CM | POA: Diagnosis not present

## 2016-03-24 NOTE — Patient Instructions (Addendum)
IF you received an x-ray today, you will receive an invoice from [facility] Radiology. Please contact [facility] at [phone] with questions or concerns regarding your invoice.   IF you received labwork today, you will receive an invoice from [employer]. Please contact [facility] at [phone] with questions or concerns regarding your invoice.   Our billing staff will not be able to assist you with questions regarding bills from these companies.  You will be contacted with the lab results as soon as they are available. The fastest way to get your results is to activate your My Chart account. Instructions are located on the last page of this paperwork. If you have not heard from [place] regarding the results in 2 weeks, please contact this office.    Please keep this wound clean by keeping it covered.  Do not get it wet for 24 hours.  Keeping the bandage in place, take your shower in 24 hours.  Then change the top dressing.  Continue to change daily.  If you have any complications--fever, nausea, swelling, uncontrollable pain, swelling--you need to return immediately.  Sebaceous Cyst Removal Sebaceous cyst removal is a procedure to remove a sac of oily material that forms under your skin (sebaceous cyst). Sebaceous cysts may also be called epidermoid cysts or keratin cysts. Normally, the skin secretes this oily material through a gland or a hair follicle. This type of cyst usually results when a skin gland or hair follicle becomes blocked. You may need this procedure if you have a sebaceous cyst that becomes large, uncomfortable, or infected. LET [facility] CARE PROVIDER KNOW ABOUT:  Any allergies you have.  All medicines you are taking, including vitamins, herbs, eye drops, creams, and over-the-counter medicines.  Previous problems you or members of your family have had with the use of anesthetics.  Any blood disorders you have.  Previous surgeries  you have had.  Medical conditions you have. RISKS AND COMPLICATIONS Generally, this is a safe procedure. However, problems may occur, including:  Developing another cyst.  Bleeding.  Infection.  Scarring. BEFORE THE PROCEDURE  Ask your health care provider about:  Changing or stopping your regular medicines. This is especially important if you are taking diabetes medicines or blood thinners.  Taking medicines such as aspirin and ibuprofen. These medicines can thin your blood. Do not take these medicines before your procedure if your health care provider instructs you not to.  If you have an infected cyst, you may have to take antibiotic medicines before or after the cyst removal. Take your antibiotics as directed by your health care provider. Finish all of the medicine even if you start to feel better.  Take a shower on the morning of your procedure. Your health care provider may ask you to use a germ-killing (antiseptic) soap. PROCEDURE  You will be given a medicine that numbs the area (local anesthetic).  The skin around the cyst will be cleaned with a germ-killing solution (antiseptic).  Your health care provider will make a small surgical incision over the cyst.  The cyst will be separated from the surrounding tissues that are under your skin.  If possible, the cyst will be removed undamaged (intact).  If the cyst bursts (ruptures), it will need to be removed in pieces.  After the cyst is removed, your health care provider will control any bleeding and close the incision with small stitches (sutures). Small incisions may not need sutures, and the bleeding will be controlled  by applying direct pressure with gauze.  Your health care provider may apply antibiotic ointment and a light bandage (dressing) over the incision. This procedure may vary among health care providers and hospitals. AFTER THE PROCEDURE  If your cyst ruptured during surgery, you may need to take  antibiotic medicine. If you were prescribed an antibiotic medicine, finish all of it even if you start to feel better.   This information is not intended to replace advice given to you by your health care provider. Make sure you discuss any questions you have with your health care provider.   Document Released: 10/07/2000 Document Revised: 10/31/2014 Document Reviewed: 06/25/2014 Elsevier Interactive Patient Education [employer].

## 2016-03-26 ENCOUNTER — Ambulatory Visit (INDEPENDENT_AMBULATORY_CARE_PROVIDER_SITE_OTHER): Payer: PPO | Admitting: Physician Assistant

## 2016-03-26 VITALS — BP 102/60 | HR 58 | Temp 98.2°F | Resp 16 | Ht 68.0 in | Wt 189.4 lb

## 2016-03-26 DIAGNOSIS — L720 Epidermal cyst: Secondary | ICD-10-CM

## 2016-03-26 DIAGNOSIS — Z5189 Encounter for other specified aftercare: Secondary | ICD-10-CM

## 2016-03-26 MED ORDER — DOXYCYCLINE HYCLATE 100 MG PO CAPS: 100.0000 mg | ORAL_CAPSULE | Freq: Two times a day (BID) | ORAL | Status: AC

## 2016-03-26 NOTE — Patient Instructions (Addendum)
     IF you received an x-ray today, you will receive an invoice from [facility]. Please contact [facility] Radiology at [phone] with questions or concerns regarding your invoice.   IF you received labwork today, you will receive an invoice from [employer]. Please contact [facility] at [phone] with questions or concerns regarding your invoice.   Our billing staff will not be able to assist you with questions regarding bills from these companies.  You will be contacted with the lab results as soon as they are available. The fastest way to get your results is to activate your My Chart account. Instructions are located on the last page of this paperwork. If you have not heard from [place] regarding the results in 2 weeks, please contact this office.    Please keep the wound clean by washing twice per day with soap and water.  Avoid deodorant soaps and high fragrant soap.  It is okay to use anti-bacterial soap.  Pat dry, then replace dressing, until it develops a scab and/or secondary skin.  Do not put alcohol, hydrogen peroxide or anything on it.   Start the antbiotic and take to completion.  Take with food.  Avoid sun exposure and taking with dairy. If you have swelling, increased pain, a great deal of oozing--please return.

## 2016-03-27 NOTE — Progress Notes (Signed)
[facility] [address], [place] [address] [phone]  Date:  03/24/2016   Name:  Paul [name]   DOB:  13-Apr-1951   MRN:  [id number]  PCP:  Chesley Noon, MD   Chief Complaint  Patient presents with  . Abscess    on the back of his neck, last 2- 3 weeks, had one in the same exact spot last year     History of Present Illness:  Paul [name] is a 65 y.o. male patient who presents to [facility] for cc of lump on back of neck on right side. Patient states that he developed  2-3 weeks ago, and has progressively worsened.  It is reddened painful for the patient. He has not done anything for this swelling at the time. He denies any fever, nausea, malaise, or neck stiffness. He has had this swelling occur last year. He went to a provider who incised the wound and drain.Marland Kitchen He reports no packing of the wound at the time, but it was only drained. For years, he will have swelling that may recur there and pain but it can generally go away with some warm compresses--however this has not resolved.     Patient Active Problem List   Diagnosis Date Noted  . OSA (obstructive sleep apnea) 12/17/2015  . Hypersomnia 12/17/2015  . Obesity 12/17/2015  . AKI (acute kidney injury) ([place]) 11/14/2015  . Ventricular tachycardia ([place]) 07/29/2014  . CAD (coronary artery disease)   . S/P CABG (coronary artery bypass graft)   . Hyperlipidemia   . Sleep apnea   . Depression   . Ischemic cardiomyopathy     Past Medical History  Diagnosis Date  . CAD (coronary artery disease)     widely patent LAD LIMA graft, widely patent PDA PLR SVG, occluded Diag 1 OM 1 SVG by cardiac CT 2007   . Ischemic cardiomyopathy     Prior inferior infarct EF 35% by ECHO 07/13/11    . Depression   . S/P CABG (coronary artery bypass graft)     CABG 06/1998 [place] with LIMA to LAD, SVG to OM, SVG to PD-PL.   OM graft occluded 10/12/2005 by CTA   . Sleep apnea   . Hyperlipidemia   . GERD  (gastroesophageal reflux disease)   . Ventricular tachycardia ([place]) 10/15    VT at 188 bpm- unstable requiring external defibrillation  . IBS (irritable bowel syndrome)   . BPH (benign prostatic hyperplasia)     Past Surgical History  Procedure Laterality Date  . Coronary artery bypass graft  1999  . Appendectomy    . Left heart catheterization with coronary/graft angiogram N/A 07/30/2014    Procedure: LEFT HEART CATHETERIZATION WITH Beatrix Fetters;  Surgeon: Jacolyn Reedy, MD;  Location: [facility] CATH LAB;  Service: Cardiovascular;  Laterality: N/A;  . Implantable cardioverter defibrillator implant N/A 07/31/2014    MDT Evalyn Casco DR ICD implanted by Dr Rayann Heman for secondary treatent of VT    Social History  Substance Use Topics  . Smoking status: Never Smoker   . Smokeless tobacco: Never Used  . Alcohol Use: 1.2 oz/week    2 Glasses of wine per week    Family History  Problem Relation Age of Onset  . Cerebral aneurysm Father   . Hepatitis Brother   . Diabetes Neg Hx   . Heart attack Neg Hx   . Stroke Maternal Aunt   . Hypertension Neg Hx  Allergies  Allergen Reactions  . Carvedilol     Fatigue   . Escitalopram Oxalate     unknown  . Niacin And Related     [place]   . Spironolactone     gynecomastia    Medication list has been reviewed and updated.  Current Outpatient Prescriptions on File Prior to Visit  Medication Sig Dispense Refill  . amiodarone (PACERONE) 400 MG tablet Take 400mg  twice daily for 1 week then 400mg  daily for 1 week then 200mg  daily (Patient taking differently: Take 200 mg by mouth daily. Take 400mg  twice daily for 1 week then 400mg  daily for 1 week then 200mg  daily) 120 tablet 1  . Ascorbic Acid (VITAMIN C) 1000 MG tablet Take 1,000 mg by mouth daily.    Marland Kitchen aspirin EC 81 MG tablet Take 81 mg by mouth daily.    Marland Kitchen atorvastatin (LIPITOR) 80 MG tablet Take 80 mg by mouth daily.    . cetirizine (ZYRTEC) 10 MG tablet Take 10 mg by mouth  daily.    . clopidogrel (PLAVIX) 75 MG tablet Take 75 mg by mouth daily.    . Coenzyme Q10 50 MG CAPS Take 1 capsule by mouth daily.    Marland Kitchen eplerenone (INSPRA) 25 MG tablet Take 25 mg by mouth daily.    . fenofibrate 160 MG tablet Take 160 mg by mouth daily.    . folic acid (FOLVITE) Q000111Q MCG tablet Take 800 mcg by mouth daily.     . metoprolol succinate (TOPROL-XL) 100 MG 24 hr tablet Take 1 tablet (100 mg total) by mouth 2 (two) times daily. (Patient taking differently: Take 100 mg by mouth 2 (two) times daily. TAKING 100MG  IN MORNING AND 50MG  AT NIGHT) 120 tablet 3  . Multiple Vitamin (MULTIVITAMIN WITH MINERALS) TABS tablet Take 1 tablet by mouth daily.    . Omega-3 Fatty Acids (FISH OIL) 1000 MG CAPS Take 1 capsule by mouth daily.    . ramipril (ALTACE) 10 MG capsule Take 10 mg by mouth daily.    . Vitamin E 400 UNITS TABS Take 800 Units by mouth daily.    Marland Kitchen omeprazole (PRILOSEC) 20 MG capsule Take 20 mg by mouth 2 (two) times daily before a meal.      No current facility-administered medications on file prior to visit.    ROS ROS otherwise unremarkable unless listed above.   Physical Examination: BP 122/74 mmHg  Pulse 54  Temp(Src) 97.9 F (36.6 C) (Oral)  Resp 16  Ht 5\' 8"  (1.727 m)  Wt 190 lb 12.8 oz (86.546 kg)  BMI 29.02 kg/m2  SpO2 95% Ideal Body Weight: Weight in (lb) to have BMI = 25: 164.1  Physical Exam  Constitutional: He is oriented to person, place, and time. He appears well-developed and well-nourished. No distress.  HENT:  Head: Normocephalic and atraumatic.  Eyes: Conjunctivae and EOM are normal. Pupils are equal, round, and reactive to light.  Cardiovascular: Normal rate.   Pulmonary/Chest: Effort normal. No respiratory distress.  Neurological: He is alert and oriented to person, place, and time.  Skin: Skin is warm and dry. He is not diaphoretic.  Right side of neck with superficial erythematous nodule. It is very fluctuant. There are pustules at the top of  this location. It is minimally tender upon palpation. No induration detected.  Psychiatric: He has a normal mood and affect. His behavior is normal.   Procedure: Verbal consent obtained. Procedure site cleansed with alcohol pad. 1% lidocaine with epi placed  along the fluctuant nodule. Procedure site cleansed with povidone.  11 blade utilized to place a centimeter incision along the nodule. Non-malodorous purulent fluid expressed. Materials of the sac are also expressed.  Wound searched with curved hemostats to investigate for further sebaceous materials. This was removed. Wound cleansed with normal saline. Quarter packing placed. Dressings applied.  Assessment and Plan: RAMEL RODELA is a 65 y.o. male who is here today for lump on neck. This appears to be an epidermal cyst that was removed without complication. Sac was removed to avoid recurrence. I've advised him to return in 48 hours for repacking. Wound care discussed  No diagnosis found.  Ivar Drape, PA-C [facility] 6/4/20174:26 PM   Ivar Drape, PA-C [facility] 03/27/2016 4:18 PM

## 2016-03-28 ENCOUNTER — Telehealth: Payer: Self-pay

## 2016-03-28 DIAGNOSIS — L723 Sebaceous cyst: Secondary | ICD-10-CM

## 2016-03-28 NOTE — Telephone Encounter (Signed)
Patient has an open wound and does not want to go without antibiotics overnight.    [place]   [phone]

## 2016-03-28 NOTE — Telephone Encounter (Signed)
Patient was prescribed doxycycline and it's causing nausea and stomach issues. Patient wants to know if she's willing to change the medication. Please advise! [phone]

## 2016-03-29 ENCOUNTER — Telehealth: Payer: Self-pay | Admitting: Emergency Medicine

## 2016-03-29 MED ORDER — CEPHALEXIN 500 MG PO CAPS: 500.0000 mg | ORAL_CAPSULE | Freq: Two times a day (BID) | ORAL | Status: AC

## 2016-03-29 NOTE — Telephone Encounter (Signed)
Pt aware  to stop taking Doxycycline and start new Rx Keflex Medication e-scribed to [place]  Pt instructed to take food with medication and call if any problems

## 2016-03-29 NOTE — Telephone Encounter (Signed)
I am sending keflex in for twice per day.  Please take with food.  Pleas ask how the wound is doing.

## 2016-03-30 ENCOUNTER — Encounter: Payer: Self-pay | Admitting: Nurse Practitioner

## 2016-03-30 ENCOUNTER — Ambulatory Visit (INDEPENDENT_AMBULATORY_CARE_PROVIDER_SITE_OTHER): Payer: PPO | Admitting: Nurse Practitioner

## 2016-03-30 VITALS — BP 116/64 | HR 56 | Ht 68.0 in | Wt 189.0 lb

## 2016-03-30 DIAGNOSIS — R001 Bradycardia, unspecified: Secondary | ICD-10-CM

## 2016-03-30 DIAGNOSIS — I255 Ischemic cardiomyopathy: Secondary | ICD-10-CM | POA: Diagnosis not present

## 2016-03-30 DIAGNOSIS — I5022 Chronic systolic (congestive) heart failure: Secondary | ICD-10-CM | POA: Diagnosis not present

## 2016-03-30 DIAGNOSIS — I472 Ventricular tachycardia, unspecified: Secondary | ICD-10-CM

## 2016-03-30 DIAGNOSIS — Z79899 Other long term (current) drug therapy: Secondary | ICD-10-CM | POA: Diagnosis not present

## 2016-03-30 NOTE — Patient Instructions (Addendum)
Medication Instructions:   Your physician recommends that you continue on your current medications as directed. Please refer to the Current Medication list given to you today.   If you need a refill on your cardiac medications before your next appointment, please call your pharmacy.  Labwork:  TSH AND LFT    Testing/Procedures: NONE ORDER TODAY    Follow-Up:  Remote monitoring is used to monitor your Pacemaker of ICD from home. This monitoring reduces the number of office visits required to check your device to one time per year. It allows [place] to keep an eye on the functioning of your device to ensure it is working properly. You are scheduled for a device check from home on . 06/29/2016.Marland KitchenMarland KitchenYou may send your transmission at any time that day. If you have a wireless device, the transmission will be sent automatically. After your physician reviews your transmission, you will receive a postcard with your next transmission date.  Your physician wants you to follow-up in:  [place] .Marland Kitchen You will receive a reminder letter in the mail two months in advance. If you don't receive a letter, please call our office to schedule the follow-up appointment.    Any Other Special Instructions Will Be Listed Below (If Applicable).

## 2016-03-30 NOTE — Telephone Encounter (Signed)
LMOM at # given explaining Stephanie's message. Asked for CB if wound does not start to improve or RTC if worsens.

## 2016-03-30 NOTE — Progress Notes (Signed)
Electrophysiology Office Note Date: 03/30/2016  ID:  Paul [name], DOB February 10, 1951, MRN [id number]  PCP: Paul Noon, MD Primary Cardiologist: Paul [name] Electrophysiologist: Paul [name]  CC: follow up for VT  Paul [name] is a 65 y.o. male seen today for Dr Paul [name]. He was admitted 11/13/15 with VT and ICD shocks.  He was placed on amiodarone at that time without subsequent ICD shocks.  He has finished cardiac rehab and is now working out at [employer].  He has lost 12 pounds intentionally.  His anxiety is improved but he is still concerned about repeat shocks. He is having increased sun sensitivity on amiodarone. He denies chest pain, palpitations, dyspnea, PND, orthopnea, nausea, vomiting, dizziness, syncope, edema, weight gain, or early satiety.  He has not had ICD shocks.   Device History: MDT dual chamber ICD implanted 2015 for ICM and VT History of appropriate therapy: Yes History of AAD therapy: Yes - amiodarone    Past Medical History  Diagnosis Date  . CAD (coronary artery disease)     widely patent LAD LIMA graft, widely patent PDA PLR SVG, occluded Diag 1 OM 1 SVG by cardiac CT 2007   . Ischemic cardiomyopathy     Prior inferior infarct EF 35% by ECHO 07/13/11    . Depression   . S/P CABG (coronary artery bypass graft)     CABG 06/1998 Paul [name], Paul [name] with LIMA to LAD, SVG to OM, SVG to PD-PL.   OM graft occluded 10/12/2005 by CTA   . Sleep apnea   . Hyperlipidemia   . GERD (gastroesophageal reflux disease)   . Ventricular tachycardia ([place]) 10/15    VT at 188 bpm- unstable requiring external defibrillation  . IBS (irritable bowel syndrome)   . BPH (benign prostatic hyperplasia)    Past Surgical History  Procedure Laterality Date  . Coronary artery bypass graft  1999  . Appendectomy    . Left heart catheterization with coronary/graft angiogram N/A 07/30/2014    Procedure: LEFT HEART CATHETERIZATION WITH Beatrix Fetters;  Surgeon: Paul Reedy, MD;  Location: [facility] CATH  LAB;  Service: Cardiovascular;  Laterality: N/A;  . Implantable cardioverter defibrillator implant N/A 07/31/2014    MDT Gwyneth Revels XT DR ICD implanted by Dr Paul [name] for secondary treatent of VT    Current Outpatient Prescriptions  Medication Sig Dispense Refill  . amiodarone (PACERONE) 200 MG tablet Take 200 mg by mouth daily.    . Ascorbic Acid (VITAMIN C) 1000 MG tablet Take 1,000 mg by mouth daily.    Marland Kitchen aspirin EC 81 MG tablet Take 81 mg by mouth daily.    Marland Kitchen atorvastatin (LIPITOR) 80 MG tablet Take 80 mg by mouth daily.    . cephALEXin (KEFLEX) 500 MG capsule Take 1 capsule (500 mg total) by mouth 2 (two) times daily. 14 capsule 0  . cetirizine (ZYRTEC) 10 MG tablet Take 10 mg by mouth daily.    . clopidogrel (PLAVIX) 75 MG tablet Take 75 mg by mouth daily.    . Coenzyme Q10 50 MG CAPS Take 1 capsule by mouth daily.    Marland Kitchen doxycycline (VIBRAMYCIN) 100 MG capsule Take 1 capsule (100 mg total) by mouth 2 (two) times daily. 14 capsule 0  . eplerenone (INSPRA) 25 MG tablet Take 25 mg by mouth daily.    . fenofibrate 160 MG tablet Take 160 mg by mouth daily.    . folic acid (FOLVITE) Q000111Q MCG tablet Take 800 mcg by mouth daily.     Marland Kitchen  metoprolol succinate (TOPROL-XL) 100 MG 24 hr tablet Take 1 tablet (100 mg total) by mouth 2 (two) times daily. (Patient taking differently: Take 100 mg by mouth 2 (two) times daily. TAKING 100MG  IN MORNING AND 50MG  AT NIGHT) 120 tablet 3  . Multiple Vitamin (MULTIVITAMIN WITH MINERALS) TABS tablet Take 1 tablet by mouth daily.    . Omega-3 Fatty Acids (FISH OIL) 1000 MG CAPS Take 1 capsule by mouth daily.    . ramipril (ALTACE) 10 MG capsule Take 10 mg by mouth daily.    . Vitamin E 400 UNITS TABS Take 800 Units by mouth daily.    Marland Kitchen omeprazole (PRILOSEC) 20 MG capsule Take 20 mg by mouth 2 (two) times daily before a meal.      No current facility-administered medications for this visit.    Allergies:   Carvedilol; Escitalopram oxalate; Niacin and related; and  Spironolactone   Social History: Social History   Social History  . Marital Status: Married    Spouse Name: N/A  . Number of Children: N/A  . Years of Education: N/A   Occupational History  . Not on file.   Social History Main Topics  . Smoking status: Never Smoker   . Smokeless tobacco: Never Used  . Alcohol Use: 1.2 oz/week    2 Glasses of wine per week  . Drug Use: No  . Sexual Activity: Not on file   Other Topics Concern  . Not on file   Social History Narrative    Family History: Family History  Problem Relation Age of Onset  . Cerebral aneurysm Father   . Hepatitis Brother   . Diabetes Neg Hx   . Heart attack Neg Hx   . Stroke Maternal Aunt   . Hypertension Neg Hx     Review of Systems: All other systems reviewed and are otherwise negative except as noted above.   Physical Exam: VS:  BP 116/64 mmHg  Pulse 56  Ht 5\' 8"  (1.727 m)  Wt 189 lb (85.73 kg)  BMI 28.74 kg/m2  SpO2 96% , BMI Body mass index is 28.74 kg/(m^2).  GEN- The patient is well appearing, alert and oriented x 3 today.   HEENT: normocephalic, atraumatic; sclera clear, conjunctiva pink; hearing intact; oropharynx clear; neck supple  Lungs- Clear to ausculation bilaterally, normal work of breathing.  No wheezes, rales, rhonchi Heart- Regular rate and rhythm  GI- soft, non-tender, non-distended, bowel sounds present  Extremities- no clubbing, cyanosis, or edema; DP/PT/radial pulses 2+ bilaterally MS- no significant deformity or atrophy Skin- warm and dry, no rash or lesion; ICD pocket well healed Psych- euthymic mood, full affect Neuro- strength and sensation are intact  ICD interrogation- reviewed in detail today,  See PACEART report  EKG:  EKG is not ordered today.  Recent Labs: 11/14/2015: ALT 24; B Natriuretic Peptide 179.0*; BUN 6; Creatinine, Ser 1.23; Hemoglobin 14.9; Magnesium 1.5*; Platelets 144*; Potassium 4.4; Sodium 141; TSH 1.676   Wt Readings from Last 3 Encounters:    03/30/16 189 lb (85.73 kg)  03/26/16 189 lb 6.4 oz (85.911 kg)  03/24/16 190 lb 12.8 oz (86.546 kg)     Other studies Reviewed: Additional studies/ records that were reviewed today include: hospital records   Assessment and Plan:  1.  Ventricular tachycardia Cycle length 347msec No recurrence since 10/2015 Continue amiodarone 200mg  daily. LFT's, TSH today. Annual eye exams recommended. Baseline PFT's normal this year.  No driving x6 months from 10/2015 - pt aware   2.  Chronic systolic dysfunction euvolemic today Stable on an appropriate medical regimen Normal ICD function See Pace Art report No changes today Continue to follow in ICM clinic  3.  ICM/CAD No recent ischemic symptoms Continue medical therapy  4.  Sinus bradycardia Exercise tolerance improved after rate response adjusted at last office visit   We talked today about continuing to increase activity level - still not to baseline after VT storm earlier this year. Pt reassurance given today.    Current medicines are reviewed at length with the patient today.   The patient does not have concerns regarding his medicines.  The following changes were made today:  none  Labs/ tests ordered today include: TSH, LFT's  Disposition:   Follow up with [facility] transmissions, ICM clinic, Dr Paul [name] 6 months   Signed, [name], NP 03/30/2016 2:21 PM  [facility] [address] [place] [address] [phone] (office) [phone] (fax)

## 2016-03-31 ENCOUNTER — Telehealth: Payer: Self-pay | Admitting: *Deleted

## 2016-03-31 LAB — HEPATIC FUNCTION PANEL
ALT: 18 U/L (ref 9–46)
AST: 24 U/L (ref 10–35)
Albumin: 4.3 g/dL (ref 3.6–5.1)
Alkaline Phosphatase: 57 U/L (ref 40–115)
BILIRUBIN DIRECT: 0.3 mg/dL — AB (ref <=0.2)
BILIRUBIN INDIRECT: 0.8 mg/dL (ref 0.2–1.2)
BILIRUBIN TOTAL: 1.1 mg/dL (ref 0.2–1.2)
Total Protein: 6.8 g/dL (ref 6.1–8.1)

## 2016-03-31 LAB — TSH: TSH: 1.7 mIU/L (ref 0.40–4.50)

## 2016-03-31 NOTE — Telephone Encounter (Signed)
-----   Message from Patsey Berthold, NP sent at 03/31/2016  7:16 AM EDT ----- Please notify patient of stable labs. Plan to repeat LFT's at next office visit.

## 2016-04-11 ENCOUNTER — Telehealth: Payer: Self-pay | Admitting: Family Medicine

## 2016-04-11 NOTE — Progress Notes (Signed)
Cardiac Rehabilitation Program Outcomes Report   Orientation:  11/10/15 Graduate Date:  03/02/16 Discharge Date:  03/02/16 # of sessions completed: 36  Cardiologist: Rosalita Chessman MD:   Class Time:  1100  A.  Exercise Program:  Tolerates exercise @ 3.60 METS for 15 minutes, Walk Test Results:  Post: 3.54 mets, Improved functional capacity  34.55 %, Improved  muscular strength  6 % and Improved education score 100 %  B.  Mental Health:  Good mental attitude, Quality of Life (QOL)  improvements:  Overall  29.49 %, Health/Functioning 46.59 %, Socioeconomics 14.35 %, Psych/Spiritual 23.29 %, Family 15.38 %   and PHQ-9: 2 from pre of 11  C.  Education/Instruction/Skills  Accurately checks own pulse.  Rest:  52  Exercise:  95, Uses Perceived Exertion Scale and/or Dyspnea Scale and Attended 13 education classes  Uses Perceived Exertion Scale and/or Dyspnea Scale  D.  Nutrition/Weight Control/Body Composition:  Adherence to prescribed nutrition program: good  and Patient has lost 5.3 kg   E.  Blood Lipids    Lab Results  Component Value Date   CHOL 137 07/30/2014   HDL 40 07/30/2014   LDLCALC 80 07/30/2014   TRIG 87 07/30/2014   CHOLHDL 3.4 07/30/2014    F.  Lifestyle Changes:  Making positive lifestyle changes and Not smoking:  Quit NEVER smoker  G.  Symptoms noted with exercise:  Asymptomatic  Report Completed By:  Stevphen Rochester RN    Comments:  Patient has graduated from [facility] CR today.  Patient has progressed well and states he feels a lot better now than when he first started. Patient plans to join maintenance program.

## 2016-04-11 NOTE — Addendum Note (Signed)
Encounter addended by: Cathie Olden, RN on: 04/11/2016  9:01 AM<BR>     Documentation filed: Clinical Notes, Notes Section

## 2016-04-11 NOTE — Progress Notes (Signed)
Patient is discharged from [place] and Pulmonary program today, 03/02/16 with 36 sessions.  He achieved LTG of 30 minutes of aerobic exercise at max met level of 3.60.  Patient has not met with dietician.  Discharge instructions have been reviewed in detail and patient expressed an understanding of material given.  Patient plans to join the maintenance program. Cardiac Rehab will make 1 month, 6 month and 1 year call backs.  Patient had no complaints of any abnormal S/S or pain on their exit visit.

## 2016-04-11 NOTE — Telephone Encounter (Signed)
Pleas advise, I did not see an results in records received. Pt was given injection may 22nd. Please advise.

## 2016-04-11 NOTE — Telephone Encounter (Signed)
Pt has been scheduled.  °

## 2016-04-11 NOTE — Telephone Encounter (Signed)
Ok to schedule B12 injxn

## 2016-04-11 NOTE — Telephone Encounter (Signed)
Pt has scheduled NP appt with Dr. Birdie Riddle, pt states that he gets B-12 shots monthly and is due for one this week. All of pt medical records have not came in, however we do have last ov and labs (given to [place]), Please advise ok to schedule for visit.

## 2016-04-11 NOTE — Telephone Encounter (Signed)
[place] for nurse visit per PCP.

## 2016-04-14 ENCOUNTER — Ambulatory Visit (INDEPENDENT_AMBULATORY_CARE_PROVIDER_SITE_OTHER): Payer: PPO | Admitting: General Practice

## 2016-04-14 DIAGNOSIS — E538 Deficiency of other specified B group vitamins: Secondary | ICD-10-CM | POA: Diagnosis not present

## 2016-04-14 MED ORDER — CYANOCOBALAMIN 1000 MCG/ML IJ SOLN
1000.0000 ug | Freq: Once | INTRAMUSCULAR | Status: AC
  Administered 2016-04-14: 1000 ug via INTRAMUSCULAR

## 2016-04-19 DIAGNOSIS — G4733 Obstructive sleep apnea (adult) (pediatric): Secondary | ICD-10-CM | POA: Diagnosis not present

## 2016-04-21 ENCOUNTER — Encounter: Payer: Self-pay | Admitting: Physician Assistant

## 2016-04-21 NOTE — Progress Notes (Signed)
[facility] [address], [place] [address] [phone]  Date:  03/26/2016   Name:  Paul [name]   DOB:  03/13/1951   MRN:  [id number]  PCP:  Annye Asa, MD    History of Present Illness:  Paul [name] is a 65 y.o. male patient who presents to [facility] follow up wound care. Patient reports no complication of that was a infected epidermal cyst status post I&D. He has been compliant with keeping bandaging on. There is very little drainage. Patient reports that there is no have any pain, fever, nausea, dizziness. Patient will be moving to town shortly.    Patient Active Problem List   Diagnosis Date Noted  . OSA (obstructive sleep apnea) 12/17/2015  . Hypersomnia 12/17/2015  . Obesity 12/17/2015  . AKI (acute kidney injury) ([place]) 11/14/2015  . Ventricular tachycardia ([place]) 07/29/2014  . CAD (coronary artery disease)   . S/P CABG (coronary artery bypass graft)   . Hyperlipidemia   . Sleep apnea   . Depression   . Ischemic cardiomyopathy     Past Medical History  Diagnosis Date  . CAD (coronary artery disease)     widely patent LAD LIMA graft, widely patent PDA PLR SVG, occluded Diag 1 OM 1 SVG by cardiac CT 2007   . Ischemic cardiomyopathy     Prior inferior infarct EF 35% by ECHO 07/13/11    . Depression   . S/P CABG (coronary artery bypass graft)     CABG 06/1998 [place] with LIMA to LAD, SVG to OM, SVG to PD-PL.   OM graft occluded 10/12/2005 by CTA   . Sleep apnea   . Hyperlipidemia   . GERD (gastroesophageal reflux disease)   . Ventricular tachycardia ([place]) 10/15    VT at 188 bpm- unstable requiring external defibrillation  . IBS (irritable bowel syndrome)   . BPH (benign prostatic hyperplasia)     Past Surgical History  Procedure Laterality Date  . Coronary artery bypass graft  1999  . Appendectomy    . Left heart catheterization with coronary/graft angiogram N/A 07/30/2014    Procedure: LEFT HEART CATHETERIZATION WITH  Beatrix Fetters;  Surgeon: Jacolyn Reedy, MD;  Location: [facility] CATH LAB;  Service: Cardiovascular;  Laterality: N/A;  . Implantable cardioverter defibrillator implant N/A 07/31/2014    MDT Evalyn Casco DR ICD implanted by Dr Rayann Heman for secondary treatent of VT    Social History  Substance Use Topics  . Smoking status: Never Smoker   . Smokeless tobacco: Never Used  . Alcohol Use: 1.2 oz/week    2 Glasses of wine per week    Family History  Problem Relation Age of Onset  . Cerebral aneurysm Father   . Hepatitis Brother   . Diabetes Neg Hx   . Heart attack Neg Hx   . Stroke Maternal Aunt   . Hypertension Neg Hx     Allergies  Allergen Reactions  . Carvedilol     Fatigue   . Escitalopram Oxalate     unknown  . Niacin And Related     Flushing   . Spironolactone     gynecomastia    Medication list has been reviewed and updated.  Current Outpatient Prescriptions on File Prior to Visit  Medication Sig Dispense Refill  . Ascorbic Acid (VITAMIN C) 1000 MG tablet Take 1,000 mg by mouth daily.    Marland Kitchen aspirin EC 81 MG tablet Take 81 mg by mouth daily.    Marland Kitchen  atorvastatin (LIPITOR) 80 MG tablet Take 80 mg by mouth daily.    . cetirizine (ZYRTEC) 10 MG tablet Take 10 mg by mouth daily.    . clopidogrel (PLAVIX) 75 MG tablet Take 75 mg by mouth daily.    . Coenzyme Q10 50 MG CAPS Take 1 capsule by mouth daily.    Marland Kitchen eplerenone (INSPRA) 25 MG tablet Take 25 mg by mouth daily.    . fenofibrate 160 MG tablet Take 160 mg by mouth daily.    . folic acid (FOLVITE) Q000111Q MCG tablet Take 800 mcg by mouth daily.     . metoprolol succinate (TOPROL-XL) 100 MG 24 hr tablet Take 1 tablet (100 mg total) by mouth 2 (two) times daily. (Patient taking differently: Take 100 mg by mouth 2 (two) times daily. TAKING 100MG  IN MORNING AND 50MG  AT NIGHT) 120 tablet 3  . Multiple Vitamin (MULTIVITAMIN WITH MINERALS) TABS tablet Take 1 tablet by mouth daily.    . Omega-3 Fatty Acids (FISH OIL) 1000 MG CAPS  Take 1 capsule by mouth daily.    . ramipril (ALTACE) 10 MG capsule Take 10 mg by mouth daily.    . Vitamin E 400 UNITS TABS Take 800 Units by mouth daily.    Marland Kitchen omeprazole (PRILOSEC) 20 MG capsule Take 20 mg by mouth 2 (two) times daily before a meal.      No current facility-administered medications on file prior to visit.    ROS ROS otherwise unremarkable unless listed above.  Physical Examination: BP 102/60 mmHg  Pulse 58  Temp(Src) 98.2 F (36.8 C) (Oral)  Resp 16  Ht 5\' 8"  (1.727 m)  Wt 189 lb 6.4 oz (85.911 kg)  BMI 28.80 kg/m2  SpO2 94% Ideal Body Weight: Weight in (lb) to have BMI = 25: 164.1  Physical Exam  Constitutional: He is oriented to person, place, and time. He appears well-developed and well-nourished. No distress.  HENT:  Head: Normocephalic and atraumatic.  Eyes: Conjunctivae and EOM are normal. Pupils are equal, round, and reactive to light.  Cardiovascular: Normal rate.   Pulmonary/Chest: Effort normal. No respiratory distress.  Neurological: He is alert and oriented to person, place, and time.  Skin: Skin is warm and dry. He is not diaphoretic.  Left side of neck with packing removed.  Wound is more superficial and open.  No heavy exudate.  Non-tender.  No lymphadenopathy.  Psychiatric: He has a normal mood and affect. His behavior is normal.     Assessment and Plan: Paul [name] is a 65 y.o. male who is here today for cc of bump. This is healing well.  Advised to now wash twice per day.  And keeping covered until scabbing.  Will rtc for alarming sxs.   Encounter for wound care - Plan: doxycycline (VIBRAMYCIN) 100 MG capsule  Epidermal cyst - Plan: doxycycline (VIBRAMYCIN) 100 MG capsule  Paul Drape, PA-C [facility] 04/21/2016 3:24 PM

## 2016-04-22 DIAGNOSIS — G4733 Obstructive sleep apnea (adult) (pediatric): Secondary | ICD-10-CM | POA: Diagnosis not present

## 2016-04-29 ENCOUNTER — Encounter: Payer: Self-pay | Admitting: General Practice

## 2016-05-03 ENCOUNTER — Ambulatory Visit (INDEPENDENT_AMBULATORY_CARE_PROVIDER_SITE_OTHER): Payer: PPO

## 2016-05-03 DIAGNOSIS — I5022 Chronic systolic (congestive) heart failure: Secondary | ICD-10-CM

## 2016-05-03 DIAGNOSIS — Z95810 Presence of automatic (implantable) cardiac defibrillator: Secondary | ICD-10-CM | POA: Diagnosis not present

## 2016-05-03 NOTE — Progress Notes (Signed)
EPIC Encounter for ICM Monitoring  Patient Name: Paul [name] is a 65 y.o. male Date: 05/03/2016 Primary Care Physican: Annye Asa, MD Primary Cardiologist: Wynonia Lawman Electrophysiologist: Allred Dry Weight: 190 lb        Heart Failure questions reviewed, pt asymptomatic  Thoracic impedence stable Low sodium diet education provided Recommendations: None  ICM trend: 05/03/2016     Follow-up plan: ICM clinic phone appointment on 06/03/2016.  Copy of ICM check sent to device physician.   [name], RN 05/03/2016 8:54 AM

## 2016-05-10 ENCOUNTER — Telehealth: Payer: Self-pay

## 2016-05-10 NOTE — Telephone Encounter (Signed)
Received call from patient. He was concerned his HR was up to 139 when exercising this morning but did not feel bad.  He stated the HR was measured on the treadmill.  He wanted to know if he should be concerned. Reviewed parameters with device RN, Debroah Loop.  Advised patient the treadmill HR may not be accurate but a rate of 139 while exercising would be considered normal.  He device parameter is 154 bpm which he was aware of.  He denied any dizziness, lightheadedness, and reported he is feeling good.

## 2016-05-16 ENCOUNTER — Ambulatory Visit (INDEPENDENT_AMBULATORY_CARE_PROVIDER_SITE_OTHER): Payer: PPO | Admitting: General Practice

## 2016-05-16 DIAGNOSIS — E538 Deficiency of other specified B group vitamins: Secondary | ICD-10-CM

## 2016-05-16 MED ORDER — CYANOCOBALAMIN 1000 MCG/ML IJ SOLN
1000.0000 ug | Freq: Once | INTRAMUSCULAR | Status: AC
  Administered 2016-05-16: 1000 ug via INTRAMUSCULAR

## 2016-05-19 DIAGNOSIS — G4733 Obstructive sleep apnea (adult) (pediatric): Secondary | ICD-10-CM | POA: Diagnosis not present

## 2016-05-31 ENCOUNTER — Other Ambulatory Visit: Payer: Self-pay | Admitting: Endodontics

## 2016-05-31 DIAGNOSIS — K121 Other forms of stomatitis: Secondary | ICD-10-CM | POA: Diagnosis not present

## 2016-06-03 ENCOUNTER — Ambulatory Visit (INDEPENDENT_AMBULATORY_CARE_PROVIDER_SITE_OTHER): Payer: PPO

## 2016-06-03 ENCOUNTER — Telehealth: Payer: Self-pay

## 2016-06-03 DIAGNOSIS — Z95810 Presence of automatic (implantable) cardiac defibrillator: Secondary | ICD-10-CM

## 2016-06-03 DIAGNOSIS — I5022 Chronic systolic (congestive) heart failure: Secondary | ICD-10-CM

## 2016-06-03 NOTE — Progress Notes (Signed)
EPIC Encounter for ICM Monitoring  Patient Name: Paul [name] is a 65 y.o. male Date: 06/03/2016 Primary Care Physican: Annye Asa, MD Primary Cardiologist: Wynonia Lawman Electrophysiologist: Allred Dry Weight: unknown      Attempted patient call and unable to reach.  Transmission reviewed.   Thoracic impedance normal.   ICM trend: 06/03/2016     Follow-up plan: ICM clinic phone appointment on 07/04/2016  Copy of ICM check sent to device physician.   [name], RN 06/03/2016 8:47 AM

## 2016-06-03 NOTE — Telephone Encounter (Signed)
Remote ICM transmission received.  Attempted patient call and left detailed message for return call.   

## 2016-06-19 DIAGNOSIS — G4733 Obstructive sleep apnea (adult) (pediatric): Secondary | ICD-10-CM | POA: Diagnosis not present

## 2016-06-29 ENCOUNTER — Ambulatory Visit: Payer: Self-pay

## 2016-06-29 ENCOUNTER — Encounter: Payer: Self-pay | Admitting: Emergency Medicine

## 2016-06-30 ENCOUNTER — Encounter: Payer: Self-pay | Admitting: General Practice

## 2016-06-30 ENCOUNTER — Encounter: Payer: Self-pay | Admitting: Family Medicine

## 2016-06-30 ENCOUNTER — Ambulatory Visit (INDEPENDENT_AMBULATORY_CARE_PROVIDER_SITE_OTHER): Payer: PPO | Admitting: Family Medicine

## 2016-06-30 VITALS — BP 119/73 | HR 50 | Temp 98.1°F | Resp 17 | Ht 68.0 in | Wt 196.5 lb

## 2016-06-30 DIAGNOSIS — Z23 Encounter for immunization: Secondary | ICD-10-CM | POA: Diagnosis not present

## 2016-06-30 DIAGNOSIS — I255 Ischemic cardiomyopathy: Secondary | ICD-10-CM | POA: Diagnosis not present

## 2016-06-30 DIAGNOSIS — G4733 Obstructive sleep apnea (adult) (pediatric): Secondary | ICD-10-CM

## 2016-06-30 DIAGNOSIS — E785 Hyperlipidemia, unspecified: Secondary | ICD-10-CM | POA: Diagnosis not present

## 2016-06-30 DIAGNOSIS — R140 Abdominal distension (gaseous): Secondary | ICD-10-CM

## 2016-06-30 DIAGNOSIS — E538 Deficiency of other specified B group vitamins: Secondary | ICD-10-CM | POA: Diagnosis not present

## 2016-06-30 LAB — HEPATIC FUNCTION PANEL
ALBUMIN: 4.4 g/dL (ref 3.5–5.2)
ALT: 26 U/L (ref 0–53)
AST: 28 U/L (ref 0–37)
Alkaline Phosphatase: 48 U/L (ref 39–117)
BILIRUBIN DIRECT: 0.3 mg/dL (ref 0.0–0.3)
TOTAL PROTEIN: 7 g/dL (ref 6.0–8.3)
Total Bilirubin: 1.1 mg/dL (ref 0.2–1.2)

## 2016-06-30 LAB — LIPID PANEL
Cholesterol: 157 mg/dL (ref 0–200)
HDL: 69.8 mg/dL (ref >39.00)
LDL CALC: 69 mg/dL (ref 0–99)
NONHDL: 86.92
Total CHOL/HDL Ratio: 2
Triglycerides: 92 mg/dL (ref 0.0–149.0)
VLDL: 18.4 mg/dL (ref 0.0–40.0)

## 2016-06-30 LAB — CBC WITH DIFFERENTIAL/PLATELET
BASOS ABS: 0 10*3/uL (ref 0.0–0.1)
Basophils Relative: 0.7 % (ref 0.0–3.0)
EOS PCT: 3.6 % (ref 0.0–5.0)
Eosinophils Absolute: 0.2 10*3/uL (ref 0.0–0.7)
HEMATOCRIT: 43.8 % (ref 39.0–52.0)
Hemoglobin: 14.9 g/dL (ref 13.0–17.0)
LYMPHS PCT: 23.9 % (ref 12.0–46.0)
Lymphs Abs: 1.2 10*3/uL (ref 0.7–4.0)
MCHC: 34 g/dL (ref 30.0–36.0)
MCV: 94.9 fl (ref 78.0–100.0)
MONOS PCT: 10.2 % (ref 3.0–12.0)
Monocytes Absolute: 0.5 10*3/uL (ref 0.1–1.0)
Neutro Abs: 3.2 10*3/uL (ref 1.4–7.7)
Neutrophils Relative %: 61.6 % (ref 43.0–77.0)
Platelets: 161 10*3/uL (ref 150.0–400.0)
RBC: 4.61 Mil/uL (ref 4.22–5.81)
RDW: 14.6 % (ref 11.5–15.5)
WBC: 5.2 10*3/uL (ref 4.0–10.5)

## 2016-06-30 LAB — BASIC METABOLIC PANEL
BUN: 12 mg/dL (ref 6–23)
CALCIUM: 9.1 mg/dL (ref 8.4–10.5)
CO2: 30 mEq/L (ref 19–32)
Chloride: 103 mEq/L (ref 96–112)
Creatinine, Ser: 1.17 mg/dL (ref 0.40–1.50)
GFR: 66.41 mL/min (ref >60.00)
GLUCOSE: 80 mg/dL (ref 70–99)
Potassium: 4.2 mEq/L (ref 3.5–5.1)
SODIUM: 139 meq/L (ref 135–145)

## 2016-06-30 LAB — TSH: TSH: 1.15 u[IU]/mL (ref 0.35–4.50)

## 2016-06-30 MED ORDER — OMEPRAZOLE 20 MG PO CPDR: 20.0000 mg | DELAYED_RELEASE_CAPSULE | Freq: Two times a day (BID) | ORAL | 1 refills | Status: DC

## 2016-06-30 MED ORDER — CYANOCOBALAMIN 1000 MCG/ML IJ SOLN
1000.0000 ug | Freq: Once | INTRAMUSCULAR | Status: AC
  Administered 2016-06-30: 1000 ug via INTRAMUSCULAR

## 2016-06-30 NOTE — Assessment & Plan Note (Signed)
Chronic problem.  Following w/ Pulmonary.  Wearing CPAP nightly.  Will follow along.

## 2016-06-30 NOTE — Assessment & Plan Note (Signed)
Chronic problem.  Due for injxn today

## 2016-06-30 NOTE — Patient Instructions (Signed)
Schedule your complete physical in 3 months We'll notify you of your lab results and make any changes if needed We'll call you with your ultrasound appt for the abdominal swelling Continue to work on healthy diet and exercise- you look great! Call with any questions or concerns Welcome!  We're glad to have you!!!

## 2016-06-30 NOTE — Assessment & Plan Note (Signed)
Chronic problem.  Following w/ Dr Wynonia Lawman and Dr Rayann Heman.  Has ICD in place.  On Amiodarone.  Getting yearly CXR w/ pulmonary.  Getting routine labs every 3-6 months w/ Cards or PCP.  Denies CP or SOB but reports ~1 month of abdominal distention.  Concern for ascites at this time due to possible portal HTN.  Get [place] to assess.  No med changes at this time but will follow closely.

## 2016-06-30 NOTE — Progress Notes (Signed)
   Subjective:    Patient ID: Paul [name], male    DOB: 16-Jun-1951, 65 y.o.   MRN: [id number]  HPI New to establish.  Previous MD- Melford Aase  Hyperlipidemia- chronic problem, on Lipitor and fenofibrate daily.  Due for repeat lipids.  No abd pain, N/V, myalgias.  Exercising M/W/F at the gym.  Ischemic Cardiomyopathy- chronic problem, following w/ cards.  Has ICD in place.  On Amiodarone (has recent LFTs and TSH), Plavix, Metoprolol, Inspra, Ramipril.  BP is excellent today.  No CP, SOB, HAs, visual changes, peripheral edema.  + abd distention- pt is concerned for ascites.  Noted x1 month  OSA- chronic problem, on CPAP nightly.  Following w/ Pulmonary.  B12 deficiency- due for injxn today.   Review of Systems For ROS see HPI     Objective:   Physical Exam  Constitutional: He is oriented to person, place, and time. He appears well-developed and well-nourished. No distress.  HENT:  Head: Normocephalic and atraumatic.  Eyes: Conjunctivae and EOM are normal. Pupils are equal, round, and reactive to light.  Neck: Normal range of motion. Neck supple. No thyromegaly present.  Cardiovascular: Normal rate, regular rhythm, normal heart sounds and intact distal pulses.   No murmur heard. Pulmonary/Chest: Effort normal and breath sounds normal. No respiratory distress.  Abdominal: Soft. Bowel sounds are normal. He exhibits distension (mild abdominal distention w/ some dullness to percussion). There is no tenderness. There is no rebound and no guarding.  Musculoskeletal: He exhibits no edema.  Lymphadenopathy:    He has no cervical adenopathy.  Neurological: He is alert and oriented to person, place, and time. No cranial nerve deficit.  Skin: Skin is warm and dry.  Psychiatric: He has a normal mood and affect. His behavior is normal.  Vitals reviewed.         Assessment & Plan:

## 2016-06-30 NOTE — Progress Notes (Signed)
Pre visit review using our clinic review tool, if applicable. No additional management support is needed unless otherwise documented below in the visit note. 

## 2016-06-30 NOTE — Assessment & Plan Note (Signed)
Chronic problem.  Tolerating statin w/o difficulty.  Check labs.  Adjust meds prn  

## 2016-07-04 ENCOUNTER — Ambulatory Visit (INDEPENDENT_AMBULATORY_CARE_PROVIDER_SITE_OTHER): Payer: PPO | Admitting: *Deleted

## 2016-07-04 DIAGNOSIS — Z95810 Presence of automatic (implantable) cardiac defibrillator: Secondary | ICD-10-CM

## 2016-07-04 DIAGNOSIS — I5022 Chronic systolic (congestive) heart failure: Secondary | ICD-10-CM

## 2016-07-04 DIAGNOSIS — I255 Ischemic cardiomyopathy: Secondary | ICD-10-CM

## 2016-07-04 NOTE — Progress Notes (Signed)
EPIC Encounter for ICM Monitoring  Patient Name: Paul [name] is a 65 y.o. male Date: 07/04/2016 Primary Care Physican: Annye Asa, MD Primary Cardiologist: Wynonia Lawman Electrophysiologist: Allred Dry Weight: unknown       Spoke with wife (DPR on file). Heart Failure questions reviewed, pt asymptomatic   Thoracic impedance normal   Recommendations: No changes.     Follow-up plan: ICM clinic phone appointment on 08/04/2016.  Copy of ICM check sent to device physician.   ICM trend: 07/04/2016       [name], RN 07/04/2016 2:14 PM

## 2016-07-04 NOTE — Progress Notes (Signed)
Remote ICD transmission.   

## 2016-07-06 ENCOUNTER — Ambulatory Visit (HOSPITAL_COMMUNITY)
Admission: RE | Admit: 2016-07-06 | Discharge: 2016-07-06 | Disposition: A | Discharge status: Home / Self Care | Payer: PPO | Source: Ambulatory Visit | Attending: Family Medicine | Admitting: Family Medicine

## 2016-07-06 DIAGNOSIS — R140 Abdominal distension (gaseous): Secondary | ICD-10-CM

## 2016-07-07 ENCOUNTER — Encounter: Payer: Self-pay | Admitting: Cardiology

## 2016-07-08 ENCOUNTER — Other Ambulatory Visit (HOSPITAL_COMMUNITY): Payer: PPO

## 2016-07-20 DIAGNOSIS — G4733 Obstructive sleep apnea (adult) (pediatric): Secondary | ICD-10-CM | POA: Diagnosis not present

## 2016-07-20 LAB — CUP PACEART REMOTE DEVICE CHECK
Battery Remaining Longevity: 111 mo
Brady Statistic AP VS Percent: 32.67 %
Brady Statistic RA Percent Paced: 32.71 %
Date Time Interrogation Session: 20170911083724
HighPow Impedance: 68 Ohm
Implantable Lead Location: 753860
Implantable Lead Model: 6935
Lead Channel Impedance Value: 342 Ohm
Lead Channel Pacing Threshold Amplitude: 0.875 V
Lead Channel Pacing Threshold Amplitude: 1 V
Lead Channel Sensing Intrinsic Amplitude: 1.75 mV
Lead Channel Sensing Intrinsic Amplitude: 1.75 mV
Lead Channel Setting Sensing Sensitivity: 0.3 mV
MDC IDC LEAD IMPLANT DT: 20151008
MDC IDC LEAD IMPLANT DT: 20151008
MDC IDC LEAD LOCATION: 753859
MDC IDC MSMT BATTERY VOLTAGE: 3 V
MDC IDC MSMT LEADCHNL RA IMPEDANCE VALUE: 456 Ohm
MDC IDC MSMT LEADCHNL RA PACING THRESHOLD PULSEWIDTH: 0.4 ms
MDC IDC MSMT LEADCHNL RV IMPEDANCE VALUE: 285 Ohm
MDC IDC MSMT LEADCHNL RV PACING THRESHOLD PULSEWIDTH: 0.4 ms
MDC IDC MSMT LEADCHNL RV SENSING INTR AMPL: 7.75 mV
MDC IDC MSMT LEADCHNL RV SENSING INTR AMPL: 7.75 mV
MDC IDC SET LEADCHNL RA PACING AMPLITUDE: 2 V
MDC IDC SET LEADCHNL RV PACING AMPLITUDE: 2.5 V
MDC IDC SET LEADCHNL RV PACING PULSEWIDTH: 0.4 ms
MDC IDC STAT BRADY AP VP PERCENT: 0.04 %
MDC IDC STAT BRADY AS VP PERCENT: 0.04 %
MDC IDC STAT BRADY AS VS PERCENT: 67.25 %
MDC IDC STAT BRADY RV PERCENT PACED: 0.08 %

## 2016-07-27 ENCOUNTER — Ambulatory Visit (INDEPENDENT_AMBULATORY_CARE_PROVIDER_SITE_OTHER): Payer: PPO | Admitting: Pulmonary Disease

## 2016-07-27 ENCOUNTER — Encounter: Payer: Self-pay | Admitting: Pulmonary Disease

## 2016-07-27 DIAGNOSIS — G4733 Obstructive sleep apnea (adult) (pediatric): Secondary | ICD-10-CM

## 2016-07-27 DIAGNOSIS — I255 Ischemic cardiomyopathy: Secondary | ICD-10-CM

## 2016-07-27 NOTE — Assessment & Plan Note (Addendum)
patient was diagnosed with obstructive sleep apnea in 2002. He used CPAP but only for a couple months. He was not tolerant to the mask. Through the years, his symptoms worsen.  Patient has cardiac issues. He has arrhythmia and congestive heart failure. Difficult to control arrhythmia. A sleep study was done recently which was normal.  HST (11/2015) AHI 24. autocpap 5-15 (12/2015) DL for 07/2016 > AHI 3.3, 93%.   Plan : 1. Good compliance and OSA corrected. Pt feels benefit of cpap use. More energy. Less sleepiness. 2. Has mask issues. We discussed re: Amara mask or Cloth mask or AirTouch or nasal pillows with chin strap. We had this issue back in June but [facility] said they did not have any Amara mask. We called today. Patient needs to have his mask changed as he is having superficial ulcer on his nasal bridge because of full face mask pressure. Try amara or air touch mask first.  3. Needs to have ONO on cpap and RA. Pt has CHF. 4. Pt feels ramp time might be too long. Need to shorten it.

## 2016-07-27 NOTE — Patient Instructions (Signed)
  It was a pleasure taking care of you today!  Continue using your CPAP machine.  We will try to get an Amara mask or Air Touch mask.  We will also repeat your oxygen test.   Please make sure you use your CPAP device everytime you sleep.  We will monitor the usage of your machine per your insurance requirement.  Your insurance company may take the machine from you if you are not using it regularly.   Please clean the mask, tubings, filter, water reservoir with soapy water every week.  Please use distilled water for the water reservoir.   Please call the office or your machine provider (DME company) if you are having issues with the device.   Return to clinic in 4 mos

## 2016-07-27 NOTE — Progress Notes (Signed)
Subjective:    Patient ID: Paul [name], male    DOB: [birth year], 65 y.o.   MRN: [id number]  HPI  This is the case of Paul [name], 65 y.o. Male, who was referred by Dr. Landry Corporal  in consultation regarding hypersomnia.   As you very well know, patient was diagnosed with sleep apnea in 2002. He used CPAP but only for a couple months. He was not tolerant to the mask. Through the years, his symptoms worsen.  He has hypersomnia, fatigue, frequent awakening, witnessed apneas, gasping, choking.  Patient ended up getting a mouth guard from a dentist for several years but these have not helped the last couple years. His symptoms have gotten worse. Hypersomnia affects functionality.  Patient has cardiac issues. He has arrhythmia and congestive heart failure. Difficult to control arrhythmia. A sleep study was done recently which was normal.  Persistence of symptoms prompted consult.  ROV (02/17/2016)  Pt was dxed with moderate OSA with AHI of 24. Received CPAP machine. Feels better using it. More energy. Less sleepiness. Download for the first month showed 93% compliance, AHI of 5. Having mask issues. Has anxiety as well.   ROV 07/27/2016 Patient returns to the office as follow-up on his sleep apnea. Since last seen, he states he has been doing well with his CPAP. Feels better using it. More energy. Less sleepiness. Had some leak issues initially but is better. Main issue is the FFM leaving a mark on his nasl bridge. We ordered amara mask in June but he never got it.  He does not want to try nasal pillows. ONO was done but no data. Sometimes, it takes forever for machine to ramp up.   Review of Systems  Constitutional: Negative for fever and unexpected weight change.  HENT: Negative for congestion, dental problem, ear pain, nosebleeds, postnasal drip, rhinorrhea, sinus pressure, sneezing, sore throat and trouble swallowing.   Eyes: Negative for redness and itching.  Respiratory: Negative for  cough, chest tightness, shortness of breath and wheezing.   Cardiovascular: Negative for palpitations and leg swelling.  Gastrointestinal: Negative for nausea and vomiting.  Genitourinary: Negative for dysuria.  Musculoskeletal: Negative for joint swelling.  Skin: Negative for rash.  Neurological: Negative for headaches.  Hematological: Bruises/bleeds easily.  Psychiatric/Behavioral: Negative for dysphoric mood. The patient is not nervous/anxious.   All other systems reviewed and are negative.  Past Medical History:  Diagnosis Date  . BPH (benign prostatic hyperplasia)   . CAD (coronary artery disease)    widely patent LAD LIMA graft, widely patent PDA PLR SVG, occluded Diag 1 OM 1 SVG by cardiac CT 2007   . Depression   . GERD (gastroesophageal reflux disease)   . Hyperlipidemia   . IBS (irritable bowel syndrome)   . Ischemic cardiomyopathy    Prior inferior infarct EF 35% by ECHO 07/13/11    . S/P CABG (coronary artery bypass graft)    CABG 06/1998 [place] with LIMA to LAD, SVG to OM, SVG to PD-PL.   OM graft occluded 10/12/2005 by CTA   . Sleep apnea   . Ventricular tachycardia ([place]) 10/15   VT at 188 bpm- unstable requiring external defibrillation   (-) asthma, copd. (-) Lung CA, DVT  Family History  Problem Relation Age of Onset  . Cerebral aneurysm Father   . Hepatitis Brother   . Stroke Maternal Aunt   . Diabetes Neg Hx   . Heart attack Neg Hx   . Hypertension Neg Hx  Past Surgical History:  Procedure Laterality Date  . APPENDECTOMY    . CORONARY ARTERY BYPASS GRAFT  1999  . IMPLANTABLE CARDIOVERTER DEFIBRILLATOR IMPLANT N/A 07/31/2014   MDT Gwyneth Revels XT DR ICD implanted by Dr Rayann Heman for secondary treatent of VT  . LEFT HEART CATHETERIZATION WITH CORONARY/GRAFT ANGIOGRAM N/A 07/30/2014   Procedure: LEFT HEART CATHETERIZATION WITH Beatrix Fetters;  Surgeon: Jacolyn Reedy, MD;  Location: [facility] CATH LAB;  Service: Cardiovascular;  Laterality: N/A;     Social History   Social History  . Marital status: Married    Spouse name: N/A  . Number of children: N/A  . Years of education: N/A   Occupational History  . Not on file.   Social History Main Topics  . Smoking status: Never Smoker  . Smokeless tobacco: Never Used  . Alcohol use 1.2 oz/week    2 Glasses of wine per week  . Drug use: No  . Sexual activity: Not on file   Other Topics Concern  . Not on file   Social History Narrative  . No narrative on file   married --with 2 children. 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   Allergies  Allergen Reactions  . Carvedilol     Fatigue   . Escitalopram Oxalate     unknown  . Niacin And Related     Flushing   . Spironolactone     gynecomastia     Outpatient Medications Prior to Visit  Medication Sig Dispense Refill  . ALPRAZolam (XANAX) 0.5 MG tablet 1/2 to 1 tablet once daily as needed    . amiodarone (PACERONE) 400 MG tablet Take 200 mg by mouth daily.     . Ascorbic Acid (VITAMIN C) 1000 MG tablet Take 1,000 mg by mouth daily.    Marland Kitchen aspirin EC 81 MG tablet Take 81 mg by mouth daily.    Marland Kitchen atorvastatin (LIPITOR) 80 MG tablet Take 80 mg by mouth daily.    . cetirizine (ZYRTEC) 10 MG tablet Take 10 mg by mouth daily.    . clopidogrel (PLAVIX) 75 MG tablet Take 75 mg by mouth daily.    . Coenzyme Q10 50 MG CAPS Take 1 capsule by mouth daily.    Marland Kitchen eplerenone (INSPRA) 25 MG tablet Take 25 mg by mouth daily.    . fenofibrate 160 MG tablet Take 160 mg by mouth daily.    . folic acid (FOLVITE) Q000111Q MCG tablet Take 800 mcg by mouth daily.     . metoprolol succinate (TOPROL-XL) 100 MG 24 hr tablet Take 1 tablet (100 mg total) by mouth 2 (two) times daily. (Patient taking differently: TAKING 100MG  IN MORNING AND 50MG  AT NIGHT) 120 tablet 3  . Multiple Vitamin (MULTIVITAMIN WITH MINERALS) TABS tablet Take 1 tablet by mouth daily.    . Omega-3 Fatty Acids (FISH OIL) 1000 MG CAPS Take 1 capsule by mouth  daily.    Marland Kitchen omeprazole (PRILOSEC) 20 MG capsule Take 1 capsule (20 mg total) by mouth 2 (two) times daily before a meal. 180 capsule 1  . ramipril (ALTACE) 10 MG capsule Take 10 mg by mouth daily.    . Vitamin E 400 UNITS TABS Take 800 Units by mouth daily.     No facility-administered medications prior to visit.    No orders of the defined types were placed in this encounter.        Objective:   Physical Exam Vitals:  Vitals:   07/27/16  1426  BP: 104/64  Pulse: (!) 52  SpO2: 94%  Weight: 194 lb 3.2 oz (88.1 kg)  Height: 5\' 8"  (1.727 m)    Constitutional/General:  Pleasant, well-nourished, well-developed, not in any distress,  Comfortably seating.  Well kempt  Body mass index is 29.53 kg/m. Wt Readings from Last 3 Encounters:  07/27/16 194 lb 3.2 oz (88.1 kg)  06/30/16 196 lb 8 oz (89.1 kg)  03/30/16 189 lb (85.7 kg)    Neck circumference:  16 inches.  HEENT: Pupils equal and reactive to light and accommodation. Anicteric sclerae. Normal nasal mucosa.   No oral  lesions,  mouth clear,  oropharynx clear, no postnasal drip. (-) Oral thrush. No dental caries.  Airway - Mallampati class IV  Neck: No masses. Midline trachea. No JVD, (-) LAD. (-) bruits appreciated.  Respiratory/Chest: Grossly normal chest. (-) deformity. (-) Accessory muscle use.  Symmetric expansion. (-) Tenderness on palpation.  Resonant on percussion.  Diminished BS on both lower lung zones. (-) wheezing, crackles, rhonchi (-) egophony  Cardiovascular: Regular rate and  rhythm, heart sounds normal, no murmur or gallops, no peripheral edema  Gastrointestinal:  Normal bowel sounds. Soft, non-tender. No hepatosplenomegaly.  (-) masses.   Musculoskeletal:  Normal muscle tone. Normal gait.   Extremities: Grossly normal. (-) clubbing, cyanosis.  (-) edema  Skin: (-) rash,lesions seen.   Neurological/Psychiatric : alert, oriented to time, place, person. Normal mood and  affect             Assessment & Plan:  OSA (obstructive sleep apnea) patient was diagnosed with obstructive sleep apnea in 2002. He used CPAP but only for a couple months. He was not tolerant to the mask. Through the years, his symptoms worsen.  Patient has cardiac issues. He has arrhythmia and congestive heart failure. Difficult to control arrhythmia. A sleep study was done recently which was normal.  HST (11/2015) AHI 24. autocpap 5-15 (12/2015) DL for 07/2016 > AHI 3.3, 93%.   Plan : 1. Good compliance and OSA corrected. Pt feels benefit of cpap use. More energy. Less sleepiness. 2. Has mask issues. We discussed re: Amara mask or Cloth mask or AirTouch or nasal pillows with chin strap. We had this issue back in June but [facility] said they did not have any Amara mask. We called today. Patient needs to have his mask changed as he is having superficial ulcer on his nasal bridge because of full face mask pressure. Try amara or air touch mask first.  3. Needs to have ONO on cpap and RA. Pt has CHF. 4. Pt feels ramp time might be too long. Need to shorten it.     Ischemic cardiomyopathy Patient being followed by cardiology. Cont cpap. We will order Noc ox on cpap and RA > likely will need O2.     Return to clinic in 4 months.   I spent at least 25 minutes with the patient today and more than 50% was spent counseling the patient regarding disease and management and facilitating labs and medications.  We called DME to facilitate tests/mask fit again.          Monica Becton, MD Pulmonary and [place] Pager: [phone] Office: [phone], Fax: [phone]

## 2016-07-27 NOTE — Assessment & Plan Note (Signed)
Patient being followed by cardiology. Cont cpap. We will order Noc ox on cpap and RA > likely will need O2.

## 2016-07-29 ENCOUNTER — Ambulatory Visit (INDEPENDENT_AMBULATORY_CARE_PROVIDER_SITE_OTHER): Payer: PPO | Admitting: Emergency Medicine

## 2016-07-29 DIAGNOSIS — E538 Deficiency of other specified B group vitamins: Secondary | ICD-10-CM

## 2016-07-29 DIAGNOSIS — Z23 Encounter for immunization: Secondary | ICD-10-CM

## 2016-07-29 MED ORDER — CYANOCOBALAMIN 1000 MCG/ML IJ SOLN
1000.0000 ug | Freq: Once | INTRAMUSCULAR | Status: AC
  Administered 2016-07-29: 1000 ug via INTRAMUSCULAR

## 2016-08-02 ENCOUNTER — Telehealth: Payer: Self-pay | Admitting: Pulmonary Disease

## 2016-08-02 ENCOUNTER — Encounter: Payer: Self-pay | Admitting: Pulmonary Disease

## 2016-08-02 ENCOUNTER — Telehealth: Payer: Self-pay

## 2016-08-02 NOTE — Telephone Encounter (Signed)
Returned patient call following voice mail message.  Spoke with wife, patient not home.  She asked if the remote for 10/12 can be rescheduled and stated I would reschedule for 08/11/2016.  She stated that would be fine and they would be home from the beach on the 18th.

## 2016-08-02 NOTE — Telephone Encounter (Signed)
lmtcb X1 for pt with unnamed family member.

## 2016-08-03 NOTE — Telephone Encounter (Signed)
Melissa with [facility] called back-got order on 07-29-16 called patient today and left message on patient's voicemail to call them back and set up appt

## 2016-08-03 NOTE — Telephone Encounter (Signed)
lmtcb x1 for pt. 

## 2016-08-03 NOTE — Telephone Encounter (Signed)
Patient returned call. He can be reached on his cell  [phone]

## 2016-08-03 NOTE — Telephone Encounter (Signed)
Per pt's chart, order was placed on 10.4.17 to [facility] and was informed that yes, the order was received and is just waiting for a Respiratory Therapist to call the patient.  Was also advised that it would likely be faster if patient called, rather than having to wait.    Called spoke with patient to discuss the above.  Patient stated he has already called [facility] x2 on 10/9 and 10/10 and was unable to receive any help.  Pt is very frustrated with his care thus far from [facility].  Apologized to pt for any frustration and informed him will call our [facility] liaison for some assistance.  LMOM TCB x1 for [facility]

## 2016-08-05 NOTE — Telephone Encounter (Signed)
lmtcb x2 for pt. 

## 2016-08-09 NOTE — Telephone Encounter (Signed)
OK to do ONO and cpap and RA.   Thanks.  Gerrit Friends

## 2016-08-09 NOTE — Telephone Encounter (Signed)
Patient called back he has already scheduled his mask fitting with [facility]. He did say that [facility] doesn't have order for ONO -pr

## 2016-08-09 NOTE — Telephone Encounter (Signed)
Per last OV notes and Plan, Dr Corrie Dandy is wanting the patient to have an ONO on CPAP on RA  Plan : 1. Good compliance and OSA corrected. Pt feels benefit of cpap use. More energy. Less sleepiness. 2. Has mask issues. We discussed re: Amara mask or Cloth mask or AirTouch or nasal pillows with chin strap. We had this issue back in June but [facility] said they did not have any Amara mask. We called today. Patient needs to have his mask changed as he is having superficial ulcer on his nasal bridge because of full face mask pressure. Try amara or air touch mask first.  3. Needs to have ONO on cpap and RA. Pt has CHF. 4. Pt feels ramp time might be too long. Need to shorten it.   Order needs to be placed for ONO (on CPAP and RA) since the patients previous test did not take the first time.  Please let us know if okay to order a 2nd ONO Dr Corrie Dandy. Thanks.

## 2016-08-10 ENCOUNTER — Other Ambulatory Visit: Payer: Self-pay | Admitting: Nurse Practitioner

## 2016-08-11 ENCOUNTER — Telehealth: Payer: Self-pay | Admitting: Cardiology

## 2016-08-11 ENCOUNTER — Ambulatory Visit (INDEPENDENT_AMBULATORY_CARE_PROVIDER_SITE_OTHER): Payer: PPO

## 2016-08-11 DIAGNOSIS — I5022 Chronic systolic (congestive) heart failure: Secondary | ICD-10-CM

## 2016-08-11 DIAGNOSIS — Z95810 Presence of automatic (implantable) cardiac defibrillator: Secondary | ICD-10-CM

## 2016-08-11 DIAGNOSIS — G4733 Obstructive sleep apnea (adult) (pediatric): Secondary | ICD-10-CM | POA: Diagnosis not present

## 2016-08-11 NOTE — Telephone Encounter (Signed)
Spoke with pt and reminded pt of remote transmission that is due today. Pt verbalized understanding.   

## 2016-08-11 NOTE — Progress Notes (Signed)
EPIC Encounter for ICM Monitoring  Patient Name: Paul [name] is a 65 y.o. male Date: 08/11/2016 Primary Care Physican: Annye Asa, MD Primary Cardiologist: Wynonia Lawman Electrophysiologist: Allred Dry Weight:    191 lbs      Heart Failure questions reviewed, pt asymptomatic   Thoracic impedance normal   Recommendations: No changes.  Advised to limit salt intake to 2000 mg daily.  Encouraged to call for fluid symptoms.    Follow-up plan: ICM clinic phone appointment on 09/13/2016.  Copy of ICM check sent to device physician.   ICM trend: 08/11/2016       [name], RN 08/11/2016 2:41 PM

## 2016-08-11 NOTE — Telephone Encounter (Signed)
Order placed as specified below, patient is aware. Nothing further needed.

## 2016-08-19 DIAGNOSIS — G4733 Obstructive sleep apnea (adult) (pediatric): Secondary | ICD-10-CM | POA: Diagnosis not present

## 2016-08-24 ENCOUNTER — Other Ambulatory Visit: Payer: Self-pay | Admitting: Family Medicine

## 2016-08-24 MED ORDER — ALPRAZOLAM 0.5 MG PO TABS: 0.5000 mg | ORAL_TABLET | Freq: Every day | ORAL | 3 refills | Status: DC | PRN

## 2016-08-24 NOTE — Telephone Encounter (Signed)
Medication filled to pharmacy as requested.   

## 2016-08-24 NOTE — Telephone Encounter (Signed)
Patient calling to request a refill of ALPRAZolam (XANAX) 0.5 MG tablet.  Pharmacy:  [place], [place] [phone] (Phone) [phone] (Fax)

## 2016-08-24 NOTE — Telephone Encounter (Signed)
Last OV 06/30/16 Alprazolam last filled 04/28/16 (listed as historical provider?

## 2016-08-26 ENCOUNTER — Ambulatory Visit (INDEPENDENT_AMBULATORY_CARE_PROVIDER_SITE_OTHER): Payer: PPO | Admitting: Emergency Medicine

## 2016-08-26 DIAGNOSIS — E538 Deficiency of other specified B group vitamins: Secondary | ICD-10-CM

## 2016-08-26 MED ORDER — CYANOCOBALAMIN 1000 MCG/ML IJ SOLN
1000.0000 ug | Freq: Once | INTRAMUSCULAR | Status: AC
  Administered 2016-08-26: 1000 ug via INTRAMUSCULAR

## 2016-09-13 ENCOUNTER — Ambulatory Visit (INDEPENDENT_AMBULATORY_CARE_PROVIDER_SITE_OTHER): Payer: PPO

## 2016-09-13 DIAGNOSIS — Z95810 Presence of automatic (implantable) cardiac defibrillator: Secondary | ICD-10-CM | POA: Diagnosis not present

## 2016-09-13 DIAGNOSIS — I5022 Chronic systolic (congestive) heart failure: Secondary | ICD-10-CM | POA: Diagnosis not present

## 2016-09-13 NOTE — Progress Notes (Signed)
EPIC Encounter for ICM Monitoring  Patient Name: Paul [name] is a 65 y.o. male Date: 09/13/2016 Primary Care Physican: Annye Asa, MD Primary Cardiologist:[place] Electrophysiologist: Allred Dry Weight:unknown                     Spoke with wife.  Heart Failure questions reviewed, pt asymptomatic   Thoracic impedance normal   Recommendations: No changes.  Reinforced low salt food choices and limiting fluid intake to < 2 liters per day. Encouraged to call for fluid symptoms.    Follow-up plan: ICM clinic phone appointment on 10/14/2016.  Copy of ICM check sent to device physician.   ICM trend: 09/13/2016       [name], RN 09/13/2016 3:26 PM

## 2016-09-19 DIAGNOSIS — G4733 Obstructive sleep apnea (adult) (pediatric): Secondary | ICD-10-CM | POA: Diagnosis not present

## 2016-10-07 ENCOUNTER — Ambulatory Visit: Payer: PPO

## 2016-10-10 ENCOUNTER — Ambulatory Visit (INDEPENDENT_AMBULATORY_CARE_PROVIDER_SITE_OTHER): Payer: PPO | Admitting: *Deleted

## 2016-10-10 DIAGNOSIS — E538 Deficiency of other specified B group vitamins: Secondary | ICD-10-CM

## 2016-10-10 MED ORDER — CYANOCOBALAMIN 1000 MCG/ML IJ SOLN
1000.0000 ug | Freq: Once | INTRAMUSCULAR | Status: AC
Start: 2016-10-10 — End: 2016-10-10
  Administered 2016-10-10: 1000 ug via INTRAMUSCULAR

## 2016-10-14 ENCOUNTER — Ambulatory Visit (INDEPENDENT_AMBULATORY_CARE_PROVIDER_SITE_OTHER): Payer: PPO

## 2016-10-14 DIAGNOSIS — Z95810 Presence of automatic (implantable) cardiac defibrillator: Secondary | ICD-10-CM | POA: Diagnosis not present

## 2016-10-14 DIAGNOSIS — I5022 Chronic systolic (congestive) heart failure: Secondary | ICD-10-CM | POA: Diagnosis not present

## 2016-10-14 NOTE — Progress Notes (Signed)
EPIC Encounter for ICM Monitoring  Patient Name: Paul [name] is a 65 y.o. male Date: 10/14/2016 Primary Care Physican: Annye Asa, MD Primary [place] Electrophysiologist: Allred Dry Weight:unknown      Heart Failure questions reviewed, pt asymptomatic   Thoracic impedance normal   Recommendations: No changes.  Reinforced to limit low salt food choices to 2000 mg day and limiting fluid intake to < 2 liters per day. Encouraged to call for fluid symptoms.  Provided direct ICM number.   Follow-up plan: ICM clinic phone appointment on 11/14/2016.  Copy of ICM check sent to device physician.   ICM trend: 10/14/2016       [name], RN 10/14/2016 8:42 AM

## 2016-10-19 DIAGNOSIS — G4733 Obstructive sleep apnea (adult) (pediatric): Secondary | ICD-10-CM | POA: Diagnosis not present

## 2016-10-21 ENCOUNTER — Encounter: Payer: Self-pay | Admitting: Family Medicine

## 2016-10-21 ENCOUNTER — Ambulatory Visit (INDEPENDENT_AMBULATORY_CARE_PROVIDER_SITE_OTHER): Payer: PPO | Admitting: Family Medicine

## 2016-10-21 VITALS — BP 106/76 | HR 50 | Temp 98.1°F | Resp 16 | Ht 68.0 in | Wt 200.5 lb

## 2016-10-21 DIAGNOSIS — Z125 Encounter for screening for malignant neoplasm of prostate: Secondary | ICD-10-CM | POA: Diagnosis not present

## 2016-10-21 DIAGNOSIS — I25810 Atherosclerosis of coronary artery bypass graft(s) without angina pectoris: Secondary | ICD-10-CM | POA: Diagnosis not present

## 2016-10-21 DIAGNOSIS — E538 Deficiency of other specified B group vitamins: Secondary | ICD-10-CM | POA: Diagnosis not present

## 2016-10-21 DIAGNOSIS — E785 Hyperlipidemia, unspecified: Secondary | ICD-10-CM | POA: Diagnosis not present

## 2016-10-21 DIAGNOSIS — Z0000 Encounter for general adult medical examination without abnormal findings: Secondary | ICD-10-CM

## 2016-10-21 DIAGNOSIS — Z85828 Personal history of other malignant neoplasm of skin: Secondary | ICD-10-CM

## 2016-10-21 LAB — BASIC METABOLIC PANEL
BUN: 13 mg/dL (ref 6–23)
CO2: 30 mEq/L (ref 19–32)
Calcium: 9.4 mg/dL (ref 8.4–10.5)
Chloride: 105 mEq/L (ref 96–112)
Creatinine, Ser: 1.3 mg/dL (ref 0.40–1.50)
GFR: 58.75 mL/min — AB (ref >60.00)
Glucose, Bld: 94 mg/dL (ref 70–99)
POTASSIUM: 4.6 meq/L (ref 3.5–5.1)
Sodium: 141 mEq/L (ref 135–145)

## 2016-10-21 LAB — CBC WITH DIFFERENTIAL/PLATELET
Basophils Absolute: 0 10*3/uL (ref 0.0–0.1)
Basophils Relative: 0.7 % (ref 0.0–3.0)
Eosinophils Absolute: 0.2 10*3/uL (ref 0.0–0.7)
Eosinophils Relative: 3.5 % (ref 0.0–5.0)
HCT: 43.9 % (ref 39.0–52.0)
Hemoglobin: 15.1 g/dL (ref 13.0–17.0)
LYMPHS ABS: 1.2 10*3/uL (ref 0.7–4.0)
Lymphocytes Relative: 22.4 % (ref 12.0–46.0)
MCHC: 34.3 g/dL (ref 30.0–36.0)
MCV: 97 fl (ref 78.0–100.0)
MONOS PCT: 10.6 % (ref 3.0–12.0)
Monocytes Absolute: 0.6 10*3/uL (ref 0.1–1.0)
NEUTROS ABS: 3.4 10*3/uL (ref 1.4–7.7)
NEUTROS PCT: 62.8 % (ref 43.0–77.0)
PLATELETS: 168 10*3/uL (ref 150.0–400.0)
RBC: 4.53 Mil/uL (ref 4.22–5.81)
RDW: 14.6 % (ref 11.5–15.5)
WBC: 5.4 10*3/uL (ref 4.0–10.5)

## 2016-10-21 LAB — HEPATIC FUNCTION PANEL
ALK PHOS: 54 U/L (ref 39–117)
ALT: 39 U/L (ref 0–53)
AST: 49 U/L — ABNORMAL HIGH (ref 0–37)
Albumin: 4.3 g/dL (ref 3.5–5.2)
BILIRUBIN TOTAL: 1 mg/dL (ref 0.2–1.2)
Bilirubin, Direct: 0.3 mg/dL (ref 0.0–0.3)
Total Protein: 6.8 g/dL (ref 6.0–8.3)

## 2016-10-21 LAB — LIPID PANEL
CHOL/HDL RATIO: 2
Cholesterol: 145 mg/dL (ref 0–200)
HDL: 68.8 mg/dL (ref >39.00)
LDL CALC: 59 mg/dL (ref 0–99)
NONHDL: 75.97
Triglycerides: 86 mg/dL (ref 0.0–149.0)
VLDL: 17.2 mg/dL (ref 0.0–40.0)

## 2016-10-21 LAB — PSA, MEDICARE: PSA: 0.71 ng/ml (ref 0.10–4.00)

## 2016-10-21 LAB — TSH: TSH: 1.25 u[IU]/mL (ref 0.35–4.50)

## 2016-10-21 LAB — VITAMIN B12: Vitamin B-12: 340 pg/mL (ref 211–911)

## 2016-10-21 MED ORDER — FLUTICASONE PROPIONATE 50 MCG/ACT NA SUSP: 2.0000 | Freq: Every day | NASAL | 6 refills | Status: AC

## 2016-10-21 NOTE — Assessment & Plan Note (Signed)
Chronic problem.  Currently receiving injxns.  Check level today and adjust tx prn.

## 2016-10-21 NOTE — Assessment & Plan Note (Signed)
Chronic problem.  Tolerating statin w/o difficulty.  Stressed need for healthy diet and regular exercise.  Check labs.  Adjust meds prn  

## 2016-10-21 NOTE — Assessment & Plan Note (Signed)
Chronic problem, following w/ Cards.  Asymptomatic at this time.  Goal is to manage lipids and BP to reduce risk.  Check labs.  Will follow along.

## 2016-10-21 NOTE — Patient Instructions (Signed)
Follow up in 6 months to recheck BP and cholesterol We'll notify you of your lab results and make any changes if needed We'll call you with your dermatology appt Continue to work on healthy diet and regular exercise- you can do it!! You are up to date on colonoscopy until 2021- yay!!! You are up to date on your immunizations- yay! Call with any questions or concerns Happy New Year!!!

## 2016-10-21 NOTE — Progress Notes (Signed)
Agree w/ B12 injxn for tx of B12 deficiency

## 2016-10-21 NOTE — Progress Notes (Signed)
Pre visit review using our clinic review tool, if applicable. No additional management support is needed unless otherwise documented below in the visit note. 

## 2016-10-21 NOTE — Progress Notes (Signed)
   Subjective:    Patient ID: Paul [name], male    DOB: [birth year], 65 y.o.   MRN: [id number]  HPI Here today for CPE.  Risk Factors: Hyperlipidemia- chronic problem, on Lipitor 80mg  and Fenofibrate.  Pt is exercising regularly- 3x/week but has gained 6 lbs since October. CAD/Ischemic Cardiomyopathy- chronic problem, following w/ Dr Rayann Heman.  On Ramipril, Metoprolol, Eplerenone, Plavix, Amiodarone. B12 deficiency- chronic problem, getting B12 injxns, due for repeat labs today Physical Activity: exercising regularly Depression: denies current sxs Hearing: normal to conversational tones, mildly decreased to whispered voice ADL's: independent Cognitive: normal linear thought process, memory and attention intact Home Safety: safe at home, lives w/ wife Height, Weight, BMI, Visual Acuity: see vitals, vision corrected to 20/20 w/ glasses Counseling: UTD on colonoscopy (due 2021), UTD on flu and Prevnar.  Due for PSA Care team reviewed and updated w/ pt Labs Ordered: See A&P Care Plan: See A&P    Review of Systems Patient reports no vision/hearing changes, anorexia, fever ,adenopathy, persistant/recurrent hoarseness, swallowing issues, chest pain, palpitations, edema, persistant/recurrent cough, hemoptysis, dyspnea (rest,exertional, paroxysmal nocturnal), gastrointestinal  bleeding (melena, rectal bleeding), abdominal pain, excessive heart burn, GU symptoms (dysuria, hematuria, voiding/incontinence issues) syncope, focal weakness, memory loss, numbness & tingling, skin/hair/nail changes, depression, anxiety, abnormal bruising/bleeding, musculoskeletal symptoms/signs.     Objective:   Physical Exam General Appearance:    Alert, cooperative, no distress, appears stated age  Head:    Normocephalic, without obvious abnormality, atraumatic  Eyes:    PERRL, conjunctiva/corneas clear, EOM's intact, fundi    benign, both eyes       Ears:    Normal TM's and external ear canals, both ears  Nose:    Nares normal, septum midline, mucosa normal, no drainage   or sinus tenderness  Throat:   Lips, mucosa, and tongue normal; teeth and gums normal  Neck:   Supple, symmetrical, trachea midline, no adenopathy;       thyroid:  No enlargement/tenderness/nodules  Back:     Symmetric, no curvature, ROM normal, no CVA tenderness  Lungs:     Clear to auscultation bilaterally, respirations unlabored  Chest wall:    No tenderness or deformity  Heart:    Regular rate and rhythm, S1 and S2 normal, no murmur, rub   or gallop  Abdomen:     Soft, non-tender, bowel sounds active all four quadrants,    no masses, no organomegaly  Genitalia:    Deferred at pt request  Rectal:    Extremities:   Extremities normal, atraumatic, no cyanosis or edema  Pulses:   2+ and symmetric all extremities  Skin:   Skin color, texture, turgor normal, no rashes or lesions  Lymph nodes:   Cervical, supraclavicular, and axillary nodes normal  Neurologic:   CNII-XII intact. Normal strength, sensation and reflexes      throughout          Assessment & Plan:

## 2016-10-21 NOTE — Assessment & Plan Note (Signed)
Pt's PE WNL w/ exception of suspicious areas on ears bilaterally (derm referral placed).  UTD on colonoscopy, immunizations.  Pt deferred GU exam today.  Written screening schedule updated and given to pt.  Check labs.  Anticipatory guidance provided.

## 2016-10-26 ENCOUNTER — Other Ambulatory Visit: Payer: Self-pay | Admitting: Family Medicine

## 2016-10-26 DIAGNOSIS — R944 Abnormal results of kidney function studies: Secondary | ICD-10-CM

## 2016-10-29 ENCOUNTER — Other Ambulatory Visit: Payer: Self-pay | Admitting: Family Medicine

## 2016-11-09 ENCOUNTER — Other Ambulatory Visit: Payer: PPO

## 2016-11-10 ENCOUNTER — Ambulatory Visit: Payer: PPO

## 2016-11-11 ENCOUNTER — Other Ambulatory Visit: Payer: Self-pay

## 2016-11-11 ENCOUNTER — Ambulatory Visit (INDEPENDENT_AMBULATORY_CARE_PROVIDER_SITE_OTHER): Payer: PPO | Admitting: *Deleted

## 2016-11-11 DIAGNOSIS — L570 Actinic keratosis: Secondary | ICD-10-CM | POA: Diagnosis not present

## 2016-11-11 DIAGNOSIS — R944 Abnormal results of kidney function studies: Secondary | ICD-10-CM | POA: Diagnosis not present

## 2016-11-11 DIAGNOSIS — C44222 Squamous cell carcinoma of skin of right ear and external auricular canal: Secondary | ICD-10-CM | POA: Diagnosis not present

## 2016-11-11 DIAGNOSIS — E538 Deficiency of other specified B group vitamins: Secondary | ICD-10-CM

## 2016-11-11 DIAGNOSIS — D492 Neoplasm of unspecified behavior of bone, soft tissue, and skin: Secondary | ICD-10-CM | POA: Diagnosis not present

## 2016-11-11 DIAGNOSIS — D0421 Carcinoma in situ of skin of right ear and external auricular canal: Secondary | ICD-10-CM | POA: Diagnosis not present

## 2016-11-11 DIAGNOSIS — L430 Hypertrophic lichen planus: Secondary | ICD-10-CM | POA: Diagnosis not present

## 2016-11-11 DIAGNOSIS — C4492 Squamous cell carcinoma of skin, unspecified: Secondary | ICD-10-CM

## 2016-11-11 HISTORY — DX: Squamous cell carcinoma of skin, unspecified: C44.92

## 2016-11-11 LAB — HEPATIC FUNCTION PANEL
ALK PHOS: 41 U/L (ref 39–117)
ALT: 27 U/L (ref 0–53)
AST: 28 U/L (ref 0–37)
Albumin: 4.1 g/dL (ref 3.5–5.2)
BILIRUBIN DIRECT: 0.2 mg/dL (ref 0.0–0.3)
Total Bilirubin: 0.8 mg/dL (ref 0.2–1.2)
Total Protein: 6.8 g/dL (ref 6.0–8.3)

## 2016-11-11 LAB — BASIC METABOLIC PANEL WITH GFR
BUN: 15 mg/dL (ref 6–23)
CO2: 26 meq/L (ref 19–32)
Calcium: 9.5 mg/dL (ref 8.4–10.5)
Chloride: 103 meq/L (ref 96–112)
Creatinine, Ser: 1.35 mg/dL (ref 0.40–1.50)
GFR: 56.23 mL/min — ABNORMAL LOW
Glucose, Bld: 77 mg/dL (ref 70–99)
Potassium: 4.7 meq/L (ref 3.5–5.1)
Sodium: 136 meq/L (ref 135–145)

## 2016-11-11 LAB — LIPID PANEL
CHOLESTEROL: 128 mg/dL (ref 0–200)
HDL: 61 mg/dL (ref >39.00)
LDL Cholesterol: 51 mg/dL (ref 0–99)
NonHDL: 67.28
TRIGLYCERIDES: 80 mg/dL (ref 0.0–149.0)
Total CHOL/HDL Ratio: 2
VLDL: 16 mg/dL (ref 0.0–40.0)

## 2016-11-11 MED ORDER — CYANOCOBALAMIN 1000 MCG/ML IJ SOLN
1000.0000 ug | Freq: Once | INTRAMUSCULAR | Status: AC
  Administered 2016-11-11: 1000 ug via INTRAMUSCULAR

## 2016-11-14 ENCOUNTER — Ambulatory Visit (INDEPENDENT_AMBULATORY_CARE_PROVIDER_SITE_OTHER): Payer: PPO

## 2016-11-14 DIAGNOSIS — Z95810 Presence of automatic (implantable) cardiac defibrillator: Secondary | ICD-10-CM

## 2016-11-14 DIAGNOSIS — I5022 Chronic systolic (congestive) heart failure: Secondary | ICD-10-CM | POA: Diagnosis not present

## 2016-11-15 NOTE — Progress Notes (Signed)
EPIC Encounter for ICM Monitoring  Patient Name: Paul [name] is a 66 y.o. male Date: 11/15/2016 Primary Care Physican: Annye Asa, MD Primary [place] Electrophysiologist: Allred Dry Weight:unknown        Heart Failure questions reviewed, pt asymptomatic   Thoracic impedance normal   Recommendations: No changes. Reminded to limit dietary salt intake to 2000 mg/day and fluid intake to < 2 liters/day. Encouraged to call for fluid symptoms.  Follow-up plan: ICM clinic phone appointment on 12/16/2016.  Copy of ICM check sent to device physician.   3 month ICM trend: 11/14/2016   1 Year ICM trend:      [name], RN 11/15/2016 3:27 PM

## 2016-11-17 ENCOUNTER — Other Ambulatory Visit: Payer: Self-pay | Admitting: Nurse Practitioner

## 2016-11-17 NOTE — Telephone Encounter (Signed)
Rx refill sent to pharmacy. 

## 2016-11-19 DIAGNOSIS — G4733 Obstructive sleep apnea (adult) (pediatric): Secondary | ICD-10-CM | POA: Diagnosis not present

## 2016-11-24 ENCOUNTER — Encounter: Payer: Self-pay | Admitting: Pulmonary Disease

## 2016-11-28 ENCOUNTER — Ambulatory Visit (INDEPENDENT_AMBULATORY_CARE_PROVIDER_SITE_OTHER): Payer: PPO | Admitting: Pulmonary Disease

## 2016-11-28 ENCOUNTER — Encounter: Payer: Self-pay | Admitting: Pulmonary Disease

## 2016-11-28 DIAGNOSIS — G4733 Obstructive sleep apnea (adult) (pediatric): Secondary | ICD-10-CM

## 2016-11-28 DIAGNOSIS — I255 Ischemic cardiomyopathy: Secondary | ICD-10-CM

## 2016-11-28 NOTE — Assessment & Plan Note (Signed)
Patient being followed by cardiology. Cont cpap. We will order Noc ox on cpap and RA > likely will need O2.  Ordered ONO on 11/28/16 again.

## 2016-11-28 NOTE — Patient Instructions (Signed)
  It was a pleasure taking care of you today!  Continue using your CPAP machine.  We will schedule you for a mask fitting session with Lynnae Sandhoff, over at the sleep lab. We will order an overnight oximetry on room air with your CPAP machine.  Please make sure you use your CPAP device everytime you sleep.  We will monitor the usage of your machine per your insurance requirement.  Your insurance company may take the machine from you if you are not using it regularly.   Please clean the mask, tubings, filter, water reservoir with soapy water every week.  Please use distilled water for the water reservoir.   Please call the office or your machine provider (DME company) if you are having issues with the device.   Return to clinic in  6 months  with Dr. Corrie Dandy or NP/APP

## 2016-11-28 NOTE — Assessment & Plan Note (Addendum)
patient was diagnosed with obstructive sleep apnea in 2002. He used CPAP but only for a couple months. He was not tolerant to the mask. Through the years, his symptoms worsen.  Patient has cardiac issues. He has arrhythmia and congestive heart failure. Difficult to control arrhythmia. A sleep study was done recently which was normal.  HST (11/2015) AHI 24. autocpap 5-15 (12/2015) DL for 2.2018 : 97%, AHI 4.9.  Unfortunately, he was unable to get a mask we ordered back in October 2017. He also has not done his ONO.  We called DME and tried to straighten things out.   Plan :  We extensively discussed the importance of treating OSA and the need to use PAP therapy.   Continue with autocpap 5-15. Good correction and compliance.   He is having issues with his DME company. He is not getting good service. We ordered a mask  3 months ago and he still has not received it. We ordered an ONO in Oct and it has not been done.  Pt tried calling DME and they still did not give mask or ONO.  According to DME, the mask he needed has magnets which will not be good for his PM. We called [facility] to straighten things out. We will order a mask fitting session with Lynnae Sandhoff to determine the best mask fit. We will see whether patient can transfer DME companies depending on his insurance. He was wondering also if he can get things online and submitted to his insurance. I asked Rodena Piety our [facility] to help pt out. We will order an ONO.    Patient was instructed to have mask, tubings, filter, reservoir cleaned at least once a week with soapy water.  Patient was instructed to call the office if he/she is having issues with the PAP device.    I advised patient to obtain sufficient amount of sleep --  7 to 8 hours at least in a 24 hr period.  Patient was advised to follow good sleep hygiene.  Patient was advised NOT to engage in activities requiring concentration and/or vigilance if he/she is and  sleepy.  Patient is NOT to  drive if he/she is sleepy.

## 2016-11-28 NOTE — Progress Notes (Signed)
Subjective:    Patient ID: Paul [name], male    DOB: 01/30/1951, 66 y.o.   MRN: [id number]  HPI  This is the case of Paul [name], 66 y.o. Male, who was referred by Dr. Landry Corporal  in consultation regarding hypersomnia.   As you very well know, patient was diagnosed with sleep apnea in 2002. He used CPAP but only for a couple months. He was not tolerant to the mask. Through the years, his symptoms worsen.  He has hypersomnia, fatigue, frequent awakening, witnessed apneas, gasping, choking.  Patient ended up getting a mouth guard from a dentist for several years but these have not helped the last couple years. His symptoms have gotten worse. Hypersomnia affects functionality.  Patient has cardiac issues. He has arrhythmia and congestive heart failure. Difficult to control arrhythmia. A sleep study was done recently which was normal.  Persistence of symptoms prompted consult.  ROV (02/17/2016)  Pt was dxed with moderate OSA with AHI of 24. Received CPAP machine. Feels better using it. More energy. Less sleepiness. Download for the first month showed 93% compliance, AHI of 5. Having mask issues. Has anxiety as well.   ROV 07/27/2016 Patient returns to the office as follow-up on his sleep apnea. Since last seen, he states he has been doing well with his CPAP. Feels better using it. More energy. Less sleepiness. Had some leak issues initially but is better. Main issue is the FFM leaving a mark on his nasl bridge. We ordered amara mask in June but he never got it.  He does not want to try nasal pillows. ONO was done but no data. Sometimes, it takes forever for machine to ramp up.   ROV 11/28/2016 Pt returns to the office as follow up on his OSA.  Since last seen, he states he has been using his cpap machine.  Feels better using it. More energy. Less sleepiness.  DL the last month : 97%, AHI 4.9.  His issue is according to him, [facility] did not have the mask that he needs. His current  mask irritates his nasal bridge causing erythema. He was told by [facility] thy could not do anything about it. Also, he was supposed to have an overnight oximetry which we ordered for the second time back in October but it was not done. He called [facility] and they said the mask he needs has a magnet and would not go well with his PM.  They did NOT have an explanation why ONO was not done.   Review of Systems  Constitutional: Negative for fever and unexpected weight change.  HENT: Negative for congestion, dental problem, ear pain, nosebleeds, postnasal drip, rhinorrhea, sinus pressure, sneezing, sore throat and trouble swallowing.   Eyes: Negative for redness and itching.  Respiratory: Negative for cough, chest tightness, shortness of breath and wheezing.   Cardiovascular: Negative for palpitations and leg swelling.  Gastrointestinal: Negative for nausea and vomiting.  Genitourinary: Negative for dysuria.  Musculoskeletal: Negative for joint swelling.  Skin: Negative for rash.  Neurological: Negative for headaches.  Hematological: Bruises/bleeds easily.  Psychiatric/Behavioral: Negative for dysphoric mood. The patient is not nervous/anxious.   All other systems reviewed and are negative.     Objective:   Physical Exam Vitals:  Vitals:   11/28/16 1333  BP: 124/82  Pulse: (!) 58  SpO2: 96%  Weight: 194 lb (88 kg)  Height: 5\' 8"  (1.727 m)    Constitutional/General:  Pleasant, well-nourished, well-developed, not in  any distress,  Comfortably seating.  Well kempt  Body mass index is 29.5 kg/m. Wt Readings from Last 3 Encounters:  11/28/16 194 lb (88 kg)  10/21/16 200 lb 8 oz (90.9 kg)  07/27/16 194 lb 3.2 oz (88.1 kg)    Neck circumference:  16 inches.  HEENT: Pupils equal and reactive to light and accommodation. Anicteric sclerae. Normal nasal mucosa.   No oral  lesions,  mouth clear,  oropharynx clear, no postnasal drip. (-) Oral thrush. No dental caries.    Airway - Mallampati class IV  Neck: No masses. Midline trachea. No JVD, (-) LAD. (-) bruits appreciated.  Respiratory/Chest: Grossly normal chest. (-) deformity. (-) Accessory muscle use.  Symmetric expansion. (-) Tenderness on palpation.  Resonant on percussion.  Diminished BS on both lower lung zones. (-) wheezing, crackles, rhonchi (-) egophony  Cardiovascular: Regular rate and  rhythm, heart sounds normal, no murmur or gallops, no peripheral edema  Gastrointestinal:  Normal bowel sounds. Soft, non-tender. No hepatosplenomegaly.  (-) masses.   Musculoskeletal:  Normal muscle tone. Normal gait.   Extremities: Grossly normal. (-) clubbing, cyanosis.  (-) edema  Skin: (-) rash,lesions seen.   Neurological/Psychiatric : alert, oriented to time, place, person. Normal mood and affect             Assessment & Plan:  OSA (obstructive sleep apnea) patient was diagnosed with obstructive sleep apnea in 2002. He used CPAP but only for a couple months. He was not tolerant to the mask. Through the years, his symptoms worsen.  Patient has cardiac issues. He has arrhythmia and congestive heart failure. Difficult to control arrhythmia. A sleep study was done recently which was normal.  HST (11/2015) AHI 24. autocpap 5-15 (12/2015) DL for 2.2018 : 97%, AHI 4.9.  Unfortunately, he was unable to get a mask we ordered back in October 2017. He also has not done his ONO.  We called DME and tried to straighten things out.   Plan :  We extensively discussed the importance of treating OSA and the need to use PAP therapy.   Continue with autocpap 5-15. Good correction and compliance.   He is having issues with his DME company. He is not getting good service. We ordered a mask  3 months ago and he still has not received it. We ordered an ONO in Oct and it has not been done.  Pt tried calling DME and they still did not give mask or ONO.  According to DME, the mask he needed has magnets  which will not be good for his PM. We called [facility] to straighten things out. We will order a mask fitting session with Lynnae Sandhoff to determine the best mask fit. We will see whether patient can transfer DME companies depending on his insurance. He was wondering also if he can get things online and submitted to his insurance. I asked Rodena Piety our [facility] to help pt out. We will order an ONO.    Patient was instructed to have mask, tubings, filter, reservoir cleaned at least once a week with soapy water.  Patient was instructed to call the office if he/she is having issues with the PAP device.    I advised patient to obtain sufficient amount of sleep --  7 to 8 hours at least in a 24 hr period.  Patient was advised to follow good sleep hygiene.  Patient was advised NOT to engage in activities requiring concentration and/or vigilance if he/she is and  sleepy.  Patient  is NOT to drive if he/she is sleepy.   Ischemic cardiomyopathy Patient being followed by cardiology. Cont cpap. We will order Noc ox on cpap and RA > likely will need O2.  Ordered ONO on 11/28/16 again.    Return to clinic in 6 mos.   I spent at least 25 minutes with the patient today and more than 50% was spent counseling the patient regarding disease and management and facilitating labs and medications.  We called DME to facilitate tests/mask fit again.    Monica Becton, MD 11/28/2016, 2:13 PM [facility] Pager [phone] After 3 pm or if no answer, call [phone]

## 2016-12-05 DIAGNOSIS — G4733 Obstructive sleep apnea (adult) (pediatric): Secondary | ICD-10-CM | POA: Diagnosis not present

## 2016-12-06 ENCOUNTER — Encounter: Payer: Self-pay | Admitting: Pulmonary Disease

## 2016-12-06 ENCOUNTER — Ambulatory Visit
Admission: RE | Admit: 2016-12-06 | Discharge: 2016-12-06 | Disposition: A | Discharge status: Home / Self Care | Payer: PPO | Source: Ambulatory Visit | Attending: Cardiology | Admitting: Cardiology

## 2016-12-06 ENCOUNTER — Other Ambulatory Visit: Payer: Self-pay | Admitting: Cardiology

## 2016-12-06 DIAGNOSIS — Z951 Presence of aortocoronary bypass graft: Secondary | ICD-10-CM | POA: Diagnosis not present

## 2016-12-06 DIAGNOSIS — I472 Ventricular tachycardia: Secondary | ICD-10-CM | POA: Diagnosis not present

## 2016-12-06 DIAGNOSIS — G4733 Obstructive sleep apnea (adult) (pediatric): Secondary | ICD-10-CM | POA: Diagnosis not present

## 2016-12-06 DIAGNOSIS — I255 Ischemic cardiomyopathy: Secondary | ICD-10-CM | POA: Diagnosis not present

## 2016-12-06 DIAGNOSIS — I2510 Atherosclerotic heart disease of native coronary artery without angina pectoris: Secondary | ICD-10-CM

## 2016-12-06 DIAGNOSIS — Z95810 Presence of automatic (implantable) cardiac defibrillator: Secondary | ICD-10-CM | POA: Diagnosis not present

## 2016-12-06 DIAGNOSIS — I499 Cardiac arrhythmia, unspecified: Secondary | ICD-10-CM | POA: Diagnosis not present

## 2016-12-06 DIAGNOSIS — E785 Hyperlipidemia, unspecified: Secondary | ICD-10-CM | POA: Diagnosis not present

## 2016-12-08 ENCOUNTER — Telehealth: Payer: Self-pay | Admitting: Pulmonary Disease

## 2016-12-08 DIAGNOSIS — D0421 Carcinoma in situ of skin of right ear and external auricular canal: Secondary | ICD-10-CM | POA: Diagnosis not present

## 2016-12-08 NOTE — Telephone Encounter (Signed)
Attempted to call pt. Received a busy signal x3. Will try back.

## 2016-12-08 NOTE — Telephone Encounter (Signed)
*  Clarification to message I took.  The patient stated he had an OV with Dr. Michaell Cowing, who said he had received last OV note from Dr. Corrie Dandy that concerned Dr. Wynonia Lawman.*

## 2016-12-09 NOTE — Telephone Encounter (Signed)
Called and spoke with pt and he stated that he is calling for the results of the ONO that he did the on Monday night.  He stated that Dr. Wynonia Lawman told him to make sure to follow up with AD, and the pt was concerned about this since he was just seen.  AD please advise of the ONO results. thanks

## 2016-12-09 NOTE — Telephone Encounter (Signed)
AD  It is in your look at

## 2016-12-09 NOTE — Telephone Encounter (Signed)
Reached out to [facility], awaiting the fax

## 2016-12-09 NOTE — Telephone Encounter (Signed)
   I called and spoke with the pt.  ONO was done start of the week. I have been in the hospital this week and will work nights next week.  Is the ONO in my cubby?  Pls let me know when it gets there. Thanks.    Monica Becton, MD 12/09/2016, 12:16 PM [facility] Pager [phone] After 3 pm or if no answer, call [phone]

## 2016-12-12 ENCOUNTER — Ambulatory Visit (INDEPENDENT_AMBULATORY_CARE_PROVIDER_SITE_OTHER): Payer: PPO | Admitting: *Deleted

## 2016-12-12 ENCOUNTER — Telehealth: Payer: Self-pay | Admitting: Pulmonary Disease

## 2016-12-12 DIAGNOSIS — G4733 Obstructive sleep apnea (adult) (pediatric): Secondary | ICD-10-CM

## 2016-12-12 DIAGNOSIS — E538 Deficiency of other specified B group vitamins: Secondary | ICD-10-CM

## 2016-12-12 MED ORDER — CYANOCOBALAMIN 1000 MCG/ML IJ SOLN
1000.0000 ug | Freq: Once | INTRAMUSCULAR | Status: AC
  Administered 2016-12-12: 1000 ug via INTRAMUSCULAR

## 2016-12-12 NOTE — Telephone Encounter (Signed)
   ONO on RA and CPAP done on 12/06/16 showed significant o2 desatn.   Paul [name] :  Pt will need O2 2L with his cpap. Pls ask [place] or DME whether he will need a "sleep study while wearing his cpap" to determine if he will need O2 or not per medicare rules. If so, pls explain to pt that we will need a sleep study per insurance reccommendations and pls order.   Thanks.   Monica Becton, MD 12/12/2016, 10:35 AM [facility] Pager [phone] After 3 pm or if no answer, call [phone]

## 2016-12-13 ENCOUNTER — Ambulatory Visit (HOSPITAL_BASED_OUTPATIENT_CLINIC_OR_DEPARTMENT_OTHER): Payer: PPO | Attending: Pulmonary Disease | Admitting: Radiology

## 2016-12-13 DIAGNOSIS — G4733 Obstructive sleep apnea (adult) (pediatric): Secondary | ICD-10-CM

## 2016-12-13 NOTE — Telephone Encounter (Signed)
Spoke with [facility] they stated that we can place the order for oxygen based on the ONO for this pt. Attempted to contact the pt to give him AD recc but whomever answered the phone stated pt was at another doctors appointment and would have him call back. No order as of now has been placed for oxygen until I have heard from pt.

## 2016-12-13 NOTE — Telephone Encounter (Signed)
AD  Spoke with [facility] we can order the oxygen for the pt based off his ONO. I spoke with the pt and gave him his results but the pt believes that the results were wrong that he does not need oxygen, that he stated whenever he checked it during the test he oxygen was in the 95%. Spoke with [facility] they assured [place] that they sent the correct one and will fax over a in depth break down of pt's results. Please let me know if you want me to schedule a follow up appointment to discuss this with pt?

## 2016-12-13 NOTE — Progress Notes (Signed)
Electrophysiology Office Note Date: 12/14/2016  ID:  Paul, [name] Oct 04, 1951, MRN [id number]  PCP: Annye Asa, MD Primary Cardiologist: Wynonia Lawman Electrophysiologist: Allred  CC: VT and ICD follow up  Paul [name] is a 66 y.o. male seen today for Dr Rayann Heman. He was admitted 11/13/15 with VT and ICD shocks.  He has been maintained on amiodarone since that time. Since last being seen in clinic, he reports doing very well.  He has started exercising and works out several days a week doing a combination of cardio and light weights. He has had occasional abdominal distention that correlates with slight weight gain.  Optivol has been stable.  He denies chest pain, palpitations, dyspnea, PND, orthopnea, nausea, vomiting, dizziness, syncope, edema, weight gain, or early satiety.  He has not had ICD shocks.   He continues to work with pulmonary on CPAP.   Device History: MDT dual chamber ICD implanted 2015 for ICM and VT History of appropriate therapy: Yes History of AAD therapy: Yes - amiodarone    Past Medical History:  Diagnosis Date  . BPH (benign prostatic hyperplasia)   . CAD (coronary artery disease)    widely patent LAD LIMA graft, widely patent PDA PLR SVG, occluded Diag 1 OM 1 SVG by cardiac CT 2007   . Depression   . GERD (gastroesophageal reflux disease)   . Hyperlipidemia   . IBS (irritable bowel syndrome)   . Ischemic cardiomyopathy    Prior inferior infarct EF 35% by ECHO 07/13/11    . S/P CABG (coronary artery bypass graft)    CABG 06/1998 [place] with LIMA to LAD, SVG to OM, SVG to PD-PL.   OM graft occluded 10/12/2005 by CTA   . Sleep apnea   . Ventricular tachycardia ([place]) 10/15   VT at 188 bpm- unstable requiring external defibrillation   Past Surgical History:  Procedure Laterality Date  . APPENDECTOMY    . CORONARY ARTERY BYPASS GRAFT  1999  . IMPLANTABLE CARDIOVERTER DEFIBRILLATOR IMPLANT N/A 07/31/2014   MDT Gwyneth Revels XT DR ICD implanted by Dr Rayann Heman  for secondary treatent of VT  . LEFT HEART CATHETERIZATION WITH CORONARY/GRAFT ANGIOGRAM N/A 07/30/2014   Procedure: LEFT HEART CATHETERIZATION WITH Beatrix Fetters;  Surgeon: Jacolyn Reedy, MD;  Location: [facility] CATH LAB;  Service: Cardiovascular;  Laterality: N/A;    Current Outpatient Prescriptions  Medication Sig Dispense Refill  . ALPRAZolam (XANAX) 0.5 MG tablet Take 1 tablet (0.5 mg total) by mouth daily as needed for anxiety. 1/2 to 1 tablet once daily as needed 30 tablet 3  . Ascorbic Acid (VITAMIN C) 1000 MG tablet Take 1,000 mg by mouth daily.    Marland Kitchen aspirin EC 81 MG tablet Take 81 mg by mouth daily.    Marland Kitchen atorvastatin (LIPITOR) 80 MG tablet Take 80 mg by mouth daily.    . cetirizine (ZYRTEC) 10 MG tablet Take 10 mg by mouth daily.    . clopidogrel (PLAVIX) 75 MG tablet Take 75 mg by mouth daily.    . Coenzyme Q10 50 MG CAPS Take 1 capsule by mouth daily.    Marland Kitchen eplerenone (INSPRA) 25 MG tablet Take 25 mg by mouth daily.    . fenofibrate 160 MG tablet Take 160 mg by mouth daily.    . fluticasone (FLONASE) 50 MCG/ACT nasal spray Place 2 sprays into both nostrils daily. 16 g 6  . folic acid (FOLVITE) Q000111Q MCG tablet Take 800 mcg by mouth daily.     Marland Kitchen  metoprolol succinate (TOPROL-XL) 100 MG 24 hr tablet TAKE (1) TABLET BY MOUTH TWICE DAILY. 180 tablet 0  . metoprolol succinate (TOPROL-XL) 100 MG 24 hr tablet Take 50-100 mg by mouth 2 (two) times daily. Take with or immediately following a meal. 100mg  in the am , 50mg  in the pm    . Multiple Vitamin (MULTIVITAMIN WITH MINERALS) TABS tablet Take 1 tablet by mouth daily.    . nitroGLYCERIN (NITROSTAT) 0.4 MG SL tablet     . Omega-3 Fatty Acids (FISH OIL) 1000 MG CAPS Take 1 capsule by mouth daily.    Marland Kitchen omeprazole (PRILOSEC) 20 MG capsule TAKE 1 CAPSULE TWICE DAILY BEFORE A MEAL. 180 capsule 1  . ramipril (ALTACE) 10 MG capsule Take 10 mg by mouth daily.    . Vitamin E 400 UNITS TABS Take 400 Units by mouth daily.     Marland Kitchen amiodarone  (PACERONE) 200 MG tablet Take 1 tablet (200 mg total) by mouth daily. 90 tablet 3  . furosemide (LASIX) 20 MG tablet Take 1 tablet (20 mg total) by mouth daily as needed (WEIGHT GAIN). 90 tablet 3   No current facility-administered medications for this visit.     Allergies:   Carvedilol; Escitalopram oxalate; Niacin and related; and Spironolactone   Social History: Social History   Social History  . Marital status: Married    Spouse name: N/A  . Number of children: N/A  . Years of education: N/A   Occupational History  . Not on file.   Social History Main Topics  . Smoking status: Never Smoker  . Smokeless tobacco: Never Used  . Alcohol use 1.2 oz/week    2 Glasses of wine per week  . Drug use: No  . Sexual activity: Not on file   Other Topics Concern  . Not on file   Social History Narrative  . No narrative on file    Family History: Family History  Problem Relation Age of Onset  . Cerebral aneurysm Father   . Hepatitis Brother   . Stroke Maternal Aunt   . Diabetes Neg Hx   . Heart attack Neg Hx   . Hypertension Neg Hx     Review of Systems: All other systems reviewed and are otherwise negative except as noted above.   Physical Exam: VS:  Pulse (!) 54   Ht 5\' 8"  (1.727 m)   Wt 196 lb (88.9 kg)   BMI 29.80 kg/m  , BMI Body mass index is 29.8 kg/m.  GEN- The patient is well appearing, alert and oriented x 3 today.   HEENT: normocephalic, atraumatic; sclera clear, conjunctiva pink; hearing intact; oropharynx clear; neck supple  Lungs- Clear to ausculation bilaterally, normal work of breathing.  No wheezes, rales, rhonchi Heart- Regular rate and rhythm  GI- soft, non-tender, non-distended, bowel sounds present  Extremities- no clubbing, cyanosis, or edema; DP/PT/radial pulses 2+ bilaterally MS- no significant deformity or atrophy Skin- warm and dry, no rash or lesion; ICD pocket well healed Psych- euthymic mood, full affect Neuro- strength and sensation  are intact  ICD interrogation- reviewed in detail today,  See PACEART report  EKG:  EKG is not ordered today.  Recent Labs: 10/21/2016: Hemoglobin 15.1; Platelets 168.0; TSH 1.25 11/11/2016: ALT 27; BUN 15; Creatinine, Ser 1.35; Potassium 4.7; Sodium 136   Wt Readings from Last 3 Encounters:  12/14/16 196 lb (88.9 kg)  11/28/16 194 lb (88 kg)  10/21/16 200 lb 8 oz (90.9 kg)     Other  studies Reviewed: Additional studies/ records that were reviewed today include: hospital records   Assessment and Plan:  1.  Ventricular tachycardia No recurrence since last office visit  Continue amiodarone 200mg  daily. Recent LFT's, TSH normal.  Annual eye exams recommended. Followed by pulmonary   2.  Chronic systolic dysfunction euvolemic today Stable on an appropriate medical regimen Normal ICD function See Pace Art report No changes today Continue to follow in ICM clinic He does have occasional weight gain as well as abdominal distention. I have given him Rx for Lasix 20mg  to take as needed. He has been advised to call our office if he is taking more than 2 days/week. Will also follow through [facility] clinic.   3.  ICM/CAD No recent ischemic symptoms Continue medical therapy  4.  Sinus bradycardia Stable No change required today  Current medicines are reviewed at length with the patient today.   The patient does not have concerns regarding his medicines.  The following changes were made today:  Lasix 20mg  prn   Labs/ tests ordered today include: none  Disposition:   Follow up with [facility] transmissions, ICM clinic, Dr Rayann Heman 1 year   Signed, [name], NP 12/14/2016 12:52 PM  [place] [address] [place] [address] [phone] (office) [phone] (fax)

## 2016-12-14 ENCOUNTER — Encounter: Payer: Self-pay | Admitting: Pulmonary Disease

## 2016-12-14 ENCOUNTER — Ambulatory Visit (INDEPENDENT_AMBULATORY_CARE_PROVIDER_SITE_OTHER): Payer: PPO | Admitting: Nurse Practitioner

## 2016-12-14 ENCOUNTER — Encounter: Payer: Self-pay | Admitting: Nurse Practitioner

## 2016-12-14 ENCOUNTER — Other Ambulatory Visit: Payer: Self-pay | Admitting: *Deleted

## 2016-12-14 VITALS — HR 54 | Ht 68.0 in | Wt 196.0 lb

## 2016-12-14 DIAGNOSIS — I255 Ischemic cardiomyopathy: Secondary | ICD-10-CM | POA: Diagnosis not present

## 2016-12-14 DIAGNOSIS — I5022 Chronic systolic (congestive) heart failure: Secondary | ICD-10-CM

## 2016-12-14 DIAGNOSIS — I472 Ventricular tachycardia, unspecified: Secondary | ICD-10-CM

## 2016-12-14 DIAGNOSIS — R001 Bradycardia, unspecified: Secondary | ICD-10-CM

## 2016-12-14 LAB — CUP PACEART INCLINIC DEVICE CHECK
Implantable Lead Implant Date: 20151008
Implantable Lead Location: 753859
Implantable Lead Model: 6935
Implantable Pulse Generator Implant Date: 20151008
MDC IDC LEAD IMPLANT DT: 20151008
MDC IDC LEAD LOCATION: 753860
MDC IDC SESS DTM: 20180221125243

## 2016-12-14 MED ORDER — FUROSEMIDE 20 MG PO TABS: 20.0000 mg | ORAL_TABLET | Freq: Every day | ORAL | 3 refills | Status: DC | PRN

## 2016-12-14 MED ORDER — AMIODARONE HCL 200 MG PO TABS: 200.0000 mg | ORAL_TABLET | Freq: Every day | ORAL | 3 refills | Status: DC

## 2016-12-14 MED ORDER — AMIODARONE HCL 400 MG PO TABS: 200.0000 mg | ORAL_TABLET | Freq: Every day | ORAL | 1 refills | Status: DC

## 2016-12-14 NOTE — Telephone Encounter (Signed)
   We can repeat ONO on cpap to determine if his O2 goes low on cpap and RA.  If he does not want to do that, we can do a lab study and cpap and RA to determine if he needs o2 or not. If he still does not want to do that, we can discuss on f/u and hold off on o2.    [place], MD 12/14/2016, 1:58 AM [facility] Pager [phone] After 3 pm or if no answer, call [phone]

## 2016-12-14 NOTE — Patient Instructions (Signed)
Medication Instructions:   START TAKING LASIX  20 MG AS NEEDED FOR WEIGHT GAIN  If you need a refill on your cardiac medications before your next appointment, please call your pharmacy.  Labwork: NONE ORDERED  TODAY    Testing/Procedures: NONE ORDERED  TODAY    Follow-Up:  Your physician wants you to follow-up in: Paul [name] will receive a reminder letter in the mail two months in advance. If you don't receive a letter, please call our office to schedule the follow-up appointment.     Any Other Special Instructions Will Be Listed Below (If Applicable).

## 2016-12-14 NOTE — Telephone Encounter (Signed)
AD please advise. Thanks. 

## 2016-12-15 NOTE — Telephone Encounter (Signed)
  Yes, pls set cpap at 11 cm water. pls send to DME. Thanks.   Monica Becton, MD 12/15/2016, 9:24 PM [facility] Pager [phone] After 3 pm or if no answer, call [phone]

## 2016-12-15 NOTE — Telephone Encounter (Signed)
AD  Pt has decided to come in for an OV to discuss ONO results and hold off on any orders for oxygen. Appointment was made for 01/02/17. He also wanted to know if an order can be placed for his pressure to be set at 11. He says when he was with Hazeline Junker that is what he had him set at and it worked for him.

## 2016-12-16 ENCOUNTER — Ambulatory Visit (INDEPENDENT_AMBULATORY_CARE_PROVIDER_SITE_OTHER): Payer: PPO

## 2016-12-16 ENCOUNTER — Telehealth: Payer: Self-pay

## 2016-12-16 DIAGNOSIS — Z95810 Presence of automatic (implantable) cardiac defibrillator: Secondary | ICD-10-CM | POA: Diagnosis not present

## 2016-12-16 DIAGNOSIS — I5022 Chronic systolic (congestive) heart failure: Secondary | ICD-10-CM | POA: Diagnosis not present

## 2016-12-16 NOTE — Telephone Encounter (Signed)
Please see phone note from 2.19.18, as AD addressed pt's ONO in that message. Will sign off.

## 2016-12-16 NOTE — Telephone Encounter (Signed)
Remote ICM transmission received.  Attempted patient call and left detailed message regarding transmission and next ICM scheduled for 01/16/2017.  Advised to return call for any fluid symptoms or questions.

## 2016-12-16 NOTE — Progress Notes (Signed)
EPIC Encounter for ICM Monitoring  Patient Name: Paul [name] is a 66 y.o. male Date: 12/16/2016 Primary Care Physican: Annye Asa, MD Primary [place] Electrophysiologist: Allred Dry Weight:unknown      Attempted call to patient and unable to reach.  Left detailed message regarding transmission.  Transmission reviewed.    Thoracic impedance normal   Prescribed dosage Lasix 20mg  to take as needed. He has been advised to call the office if he is taking more than 2 days/week per [name], NP on 12/14/16  Recommendations: Left voice mail with ICM number and encouraged to call for fluid symptoms.  Follow-up plan: ICM clinic phone appointment on 01/16/2017.  Copy of ICM check sent to device physician.   3 month ICM trend: 12/16/2016   1 Year ICM trend:      [name], RN 12/16/2016 7:43 AM

## 2016-12-16 NOTE — Telephone Encounter (Signed)
Order is placed. Pt is aware that the order was going to be placed. Nothing further is needed

## 2016-12-20 DIAGNOSIS — G4733 Obstructive sleep apnea (adult) (pediatric): Secondary | ICD-10-CM | POA: Diagnosis not present

## 2016-12-20 NOTE — Addendum Note (Signed)
Addended by: Claude Manges on: 12/20/2016 10:37 AM   Modules accepted: Orders

## 2016-12-31 ENCOUNTER — Other Ambulatory Visit: Payer: Self-pay | Admitting: Family Medicine

## 2017-01-02 ENCOUNTER — Ambulatory Visit: Payer: PPO | Admitting: Pulmonary Disease

## 2017-01-02 ENCOUNTER — Encounter: Payer: Self-pay | Admitting: Pulmonary Disease

## 2017-01-02 NOTE — Telephone Encounter (Signed)
Last OV 10/21/16 Alprazolam last filled 08/24/16 #30 with 3

## 2017-01-03 ENCOUNTER — Ambulatory Visit (INDEPENDENT_AMBULATORY_CARE_PROVIDER_SITE_OTHER): Payer: PPO | Admitting: Pulmonary Disease

## 2017-01-03 DIAGNOSIS — G4733 Obstructive sleep apnea (adult) (pediatric): Secondary | ICD-10-CM | POA: Diagnosis not present

## 2017-01-03 DIAGNOSIS — I255 Ischemic cardiomyopathy: Secondary | ICD-10-CM | POA: Diagnosis not present

## 2017-01-03 NOTE — Assessment & Plan Note (Signed)
Patient being followed by cardiology. Cont cpap and RA.

## 2017-01-03 NOTE — Assessment & Plan Note (Addendum)
patient was diagnosed with obstructive sleep apnea in 2002. He used CPAP but only for a couple months. He was not tolerant to the mask. Through the years, his symptoms worsen.  Patient has cardiac issues. He has arrhythmia and congestive heart failure. Difficult to control arrhythmia. A sleep study was done recently which was normal.  HST (11/2015) AHI 24. autocpap 5-15 (12/2015) DL for 2.2018 : 97%, AHI 4.9.  Patient had issues with mask and had a mask fitting session with Lynnae Sandhoff.  What worked best was amara mask but he had a lot of leak so he reverted to using his FFM. Using FFM with Cuvay nasal strip to prevent from skin irritation. Better using it. More energy. Less sleepiness.   He also had an ONO with cpap and RA and he had significant o2 desaturation.  He feels good otherwise. Cardiac issues have been stable.  He said the time he wakes up, he checks his oxygen level and is always 90%. Per medicare, he needs a lab study to determine if he will need o2 with cpap.  He wants to hold off as he is not too symptomatic.   Dl the last month on cpap 11 cm water : 100%, AHI 5.5  Plan :  We extensively discussed the importance of treating OSA and the need to use PAP therapy.   Continue with cpap 11 cm water on RA. He wants to avoid lab study to determine the need for oxygen. I am okay with that for now. Need to watch out for worsening of symptoms or if his heart starts misbehaving  with frequent admissions. At that point I think he'll need a lab study with CPAP to determine whether he needs oxygen or not. We can check an ONO on cpap and RA in 6 mos and see if he has more significant o2 desatn. His last ONO was not the best and he had 5 minutes desatn on RA and cpap.    Patient was instructed to have mask, tubings, filter, reservoir cleaned at least once a week with soapy water.  Patient was instructed to call the office if he/she is having issues with the PAP device.    I advised patient to obtain  sufficient amount of sleep --  7 to 8 hours at least in a 24 hr period.  Patient was advised to follow good sleep hygiene.  Patient was advised NOT to engage in activities requiring concentration and/or vigilance if he/she is and  sleepy.  Patient is NOT to drive if he/she is sleepy.

## 2017-01-03 NOTE — Progress Notes (Signed)
Subjective:    Patient ID: Paul [name], male    DOB: [birth year], 66 y.o.   MRN: [id number]  HPI  This is the case of Paul [name], 65 y.o. Male, who was referred by Dr. Landry Corporal  in consultation regarding hypersomnia.   As you very well know, patient was diagnosed with sleep apnea in 2002. He used CPAP but only for a couple months. He was not tolerant to the mask. Through the years, his symptoms worsen.  He has hypersomnia, fatigue, frequent awakening, witnessed apneas, gasping, choking.  Patient ended up getting a mouth guard from a dentist for several years but these have not helped the last couple years. His symptoms have gotten worse. Hypersomnia affects functionality.  Patient has cardiac issues. He has arrhythmia and congestive heart failure. Difficult to control arrhythmia. A sleep study was done recently which was normal.  Persistence of symptoms prompted consult.  ROV (02/17/2016)  Pt was dxed with moderate OSA with AHI of 24. Received CPAP machine. Feels better using it. More energy. Less sleepiness. Download for the first month showed 93% compliance, AHI of 5. Having mask issues. Has anxiety as well.   ROV 07/27/2016 Patient returns to the office as follow-up on his sleep apnea. Since last seen, he states he has been doing well with his CPAP. Feels better using it. More energy. Less sleepiness. Had some leak issues initially but is better. Main issue is the FFM leaving a mark on his nasl bridge. We ordered amara mask in June but he never got it.  He does not want to try nasal pillows. ONO was done but no data. Sometimes, it takes forever for machine to ramp up.   ROV 11/28/2016 Pt returns to the office as follow up on his OSA.  Since last seen, he states he has been using his cpap machine.  Feels better using it. More energy. Less sleepiness.  DL the last month : 97%, AHI 4.9.  His issue is according to him, [facility] did not have the mask that he needs. His current  mask irritates his nasal bridge causing erythema. He was told by [facility] thy could not do anything about it. Also, he was supposed to have an overnight oximetry which we ordered for the second time back in October but it was not done. He called [facility] and they said the mask he needs has a magnet and would not go well with his PM.  They did NOT have an explanation why ONO was not done.   ROV 01/03/2017 patient returns to the office as follow-up with sleep apnea. Since last seen, he had a mask fitting session and what worked best was amara mask but he had a lot of leak so he reverted to using his FFM. Better using it. More energy. Less sleepiness. He also had an ONO with cpap and RA and he had significant o2 desaturation.  He feels good otherwise. Cardiac issues have been stable.  Review of Systems  Constitutional: Negative for fever and unexpected weight change.  HENT: Negative for congestion, dental problem, ear pain, nosebleeds, postnasal drip, rhinorrhea, sinus pressure, sneezing, sore throat and trouble swallowing.   Eyes: Negative for redness and itching.  Respiratory: Negative for cough, chest tightness, shortness of breath and wheezing.   Cardiovascular: Negative for palpitations and leg swelling.  Gastrointestinal: Negative for nausea and vomiting.  Genitourinary: Negative for dysuria.  Musculoskeletal: Negative for joint swelling.  Skin: Negative for rash.  Neurological:  Negative for headaches.  Hematological: Bruises/bleeds easily.  Psychiatric/Behavioral: Negative for dysphoric mood. The patient is not nervous/anxious.   All other systems reviewed and are negative.     Objective:   Physical Exam Vitals:  Vitals:   01/03/17 1454  BP: 122/70  Pulse: (!) 52  SpO2: 96%  Weight: 201 lb 6 oz (91.3 kg)    Constitutional/General:  Pleasant, well-nourished, well-developed, not in any distress,  Comfortably seating.  Well kempt  Body mass index is 30.62  kg/m. Wt Readings from Last 3 Encounters:  01/03/17 201 lb 6 oz (91.3 kg)  12/14/16 196 lb (88.9 kg)  11/28/16 194 lb (88 kg)    Neck circumference:  16 inches.  HEENT: Pupils equal and reactive to light and accommodation. Anicteric sclerae. Normal nasal mucosa.   No oral  lesions,  mouth clear,  oropharynx clear, no postnasal drip. (-) Oral thrush. No dental caries.  Airway - Mallampati class IV  Neck: No masses. Midline trachea. No JVD, (-) LAD. (-) bruits appreciated.  Respiratory/Chest: Grossly normal chest. (-) deformity. (-) Accessory muscle use.  Symmetric expansion. (-) Tenderness on palpation.  Resonant on percussion.  Diminished BS on both lower lung zones. (-) wheezing, crackles, rhonchi (-) egophony  Cardiovascular: Regular rate and  rhythm, heart sounds normal, no murmur or gallops, no peripheral edema  Gastrointestinal:  Normal bowel sounds. Soft, non-tender. No hepatosplenomegaly.  (-) masses.   Musculoskeletal:  Normal muscle tone. Normal gait.   Extremities: Grossly normal. (-) clubbing, cyanosis.  (-) edema  Skin: (-) rash,lesions seen.   Neurological/Psychiatric : alert, oriented to time, place, person. Normal mood and affect             Assessment & Plan:  OSA (obstructive sleep apnea) patient was diagnosed with obstructive sleep apnea in 2002. He used CPAP but only for a couple months. He was not tolerant to the mask. Through the years, his symptoms worsen.  Patient has cardiac issues. He has arrhythmia and congestive heart failure. Difficult to control arrhythmia. A sleep study was done recently which was normal.  HST (11/2015) AHI 24. autocpap 5-15 (12/2015) DL for 2.2018 : 97%, AHI 4.9.  Patient had issues with mask and had a mask fitting session with Lynnae Sandhoff.  What worked best was amara mask but he had a lot of leak so he reverted to using his FFM. Using FFM with Cuvay nasal strip to prevent from skin irritation. Better using it. More  energy. Less sleepiness.   He also had an ONO with cpap and RA and he had significant o2 desaturation.  He feels good otherwise. Cardiac issues have been stable.  He said the time he wakes up, he checks his oxygen level and is always 90%. Per medicare, he needs a lab study to determine if he will need o2 with cpap.  He wants to hold off as he is not too symptomatic.   Dl the last month on cpap 11 cm water : 100%, AHI 5.5  Plan :  We extensively discussed the importance of treating OSA and the need to use PAP therapy.   Continue with cpap 11 cm water on RA. He wants to avoid lab study to determine the need for oxygen. I am okay with that for now. Need to watch out for worsening of symptoms or if his heart starts misbehaving  with frequent admissions. At that point I think he'll need a lab study with CPAP to determine whether he needs oxygen or not. We  can check an ONO on cpap and RA in 6 mos and see if he has more significant o2 desatn. His last ONO was not the best and he had 5 minutes desatn on RA and cpap.    Patient was instructed to have mask, tubings, filter, reservoir cleaned at least once a week with soapy water.  Patient was instructed to call the office if he/she is having issues with the PAP device.    I advised patient to obtain sufficient amount of sleep --  7 to 8 hours at least in a 24 hr period.  Patient was advised to follow good sleep hygiene.  Patient was advised NOT to engage in activities requiring concentration and/or vigilance if he/she is and  sleepy.  Patient is NOT to drive if he/she is sleepy.    Ischemic cardiomyopathy Patient being followed by cardiology. Cont cpap and RA.     Return to clinic in 6 mos.    Monica Becton, MD 01/03/2017, 3:30 PM [facility] Pager [phone] After 3 pm or if no answer, call [phone]

## 2017-01-03 NOTE — Patient Instructions (Signed)
  It was a pleasure taking care of you today!  Continue using your CPAP machine.   Please make sure you use your CPAP device everytime you sleep.  We will monitor the usage of your machine per your insurance requirement.  Your insurance company may take the machine from you if you are not using it regularly.   Please clean the mask, tubings, filter, water reservoir with soapy water every week.  Please use distilled water for the water reservoir.   Please call the office or your machine provider (DME company) if you are having issues with the device.   Return to clinic in 6 months with NP/APP

## 2017-01-09 ENCOUNTER — Ambulatory Visit: Payer: PPO

## 2017-01-12 ENCOUNTER — Ambulatory Visit (INDEPENDENT_AMBULATORY_CARE_PROVIDER_SITE_OTHER): Payer: PPO | Admitting: *Deleted

## 2017-01-12 ENCOUNTER — Telehealth: Payer: Self-pay | Admitting: *Deleted

## 2017-01-12 DIAGNOSIS — E538 Deficiency of other specified B group vitamins: Secondary | ICD-10-CM

## 2017-01-12 MED ORDER — CYANOCOBALAMIN 1000 MCG/ML IJ SOLN
1000.0000 ug | Freq: Once | INTRAMUSCULAR | Status: AC
  Administered 2017-01-12: 1000 ug via INTRAMUSCULAR

## 2017-01-12 NOTE — Telephone Encounter (Signed)
Patient is interested in getting a shingles vaccine.   He called the pharmacy to discuss with them and he was told there are now two different versions.  He would like your opinon on 1. Should he get this 2. Which one would be better for him 3. Does he have to have a RX for this or can he just get it from a pharmacy.   Patient stated that he is okay with a phone call letting him know or a MyChart message is also okay!

## 2017-01-12 NOTE — Telephone Encounter (Signed)
He can get the Shingrix- the new shot- if it is covered by insurance.  It is a series of 2 shots- the 2nd one occurring 2-6 months after the 1st.  We can send this prescription if he would like

## 2017-01-12 NOTE — Telephone Encounter (Signed)
Called and spoke with pt wife and informed

## 2017-01-16 ENCOUNTER — Ambulatory Visit (INDEPENDENT_AMBULATORY_CARE_PROVIDER_SITE_OTHER): Payer: PPO

## 2017-01-16 DIAGNOSIS — I5022 Chronic systolic (congestive) heart failure: Secondary | ICD-10-CM

## 2017-01-16 DIAGNOSIS — Z95810 Presence of automatic (implantable) cardiac defibrillator: Secondary | ICD-10-CM

## 2017-01-17 ENCOUNTER — Encounter: Payer: Self-pay | Admitting: Family Medicine

## 2017-01-17 DIAGNOSIS — G4733 Obstructive sleep apnea (adult) (pediatric): Secondary | ICD-10-CM | POA: Diagnosis not present

## 2017-01-17 NOTE — Progress Notes (Signed)
EPIC Encounter for ICM Monitoring  Patient Name: ABRHAM MASLOWSKI is a 66 y.o. male Date: 01/17/2017 Primary Care Physican: Annye Asa, MD Primary [place] Electrophysiologist: Allred Dry Weight:unknown           Heart Failure questions reviewed, pt asymptomatic.   Thoracic impedance normal.  Prescribed and confirmed dosage: Furosemide 20 mg 1 tablet (20 mg total) by mouth daily as needed (WEIGHT GAIN).1 tablet (20 mg total) by mouth daily as needed (WEIGHT GAIN).  Recommendations: No changes. Reminded to limit dietary salt intake to 2000 mg/day and fluid intake to < 2 liters/day. Encouraged to call for fluid symptoms.  Follow-up plan: ICM clinic phone appointment on 02/17/2017.  Copy of ICM check sent to device physician.   3 month ICM trend: 01/16/2017   1 Year ICM trend:     [name], RN 01/17/2017 7:54 AM

## 2017-01-18 MED ORDER — ZOLPIDEM TARTRATE 5 MG PO TABS: 5.0000 mg | ORAL_TABLET | Freq: Every evening | ORAL | 3 refills | Status: DC | PRN

## 2017-01-23 DIAGNOSIS — I2510 Atherosclerotic heart disease of native coronary artery without angina pectoris: Secondary | ICD-10-CM | POA: Diagnosis not present

## 2017-01-23 DIAGNOSIS — Z95810 Presence of automatic (implantable) cardiac defibrillator: Secondary | ICD-10-CM | POA: Diagnosis not present

## 2017-01-23 DIAGNOSIS — G4733 Obstructive sleep apnea (adult) (pediatric): Secondary | ICD-10-CM | POA: Diagnosis not present

## 2017-01-23 DIAGNOSIS — E785 Hyperlipidemia, unspecified: Secondary | ICD-10-CM | POA: Diagnosis not present

## 2017-01-23 DIAGNOSIS — I255 Ischemic cardiomyopathy: Secondary | ICD-10-CM | POA: Diagnosis not present

## 2017-01-23 DIAGNOSIS — Z951 Presence of aortocoronary bypass graft: Secondary | ICD-10-CM | POA: Diagnosis not present

## 2017-01-23 DIAGNOSIS — I472 Ventricular tachycardia: Secondary | ICD-10-CM | POA: Diagnosis not present

## 2017-02-17 ENCOUNTER — Ambulatory Visit (INDEPENDENT_AMBULATORY_CARE_PROVIDER_SITE_OTHER): Payer: PPO

## 2017-02-17 ENCOUNTER — Ambulatory Visit (INDEPENDENT_AMBULATORY_CARE_PROVIDER_SITE_OTHER): Payer: PPO | Admitting: *Deleted

## 2017-02-17 ENCOUNTER — Telehealth: Payer: Self-pay | Admitting: Cardiology

## 2017-02-17 DIAGNOSIS — E538 Deficiency of other specified B group vitamins: Secondary | ICD-10-CM

## 2017-02-17 DIAGNOSIS — I5022 Chronic systolic (congestive) heart failure: Secondary | ICD-10-CM | POA: Diagnosis not present

## 2017-02-17 DIAGNOSIS — Z95810 Presence of automatic (implantable) cardiac defibrillator: Secondary | ICD-10-CM | POA: Diagnosis not present

## 2017-02-17 MED ORDER — CYANOCOBALAMIN 1000 MCG/ML IJ SOLN
1000.0000 ug | Freq: Once | INTRAMUSCULAR | Status: AC
  Administered 2017-02-17: 1000 ug via INTRAMUSCULAR

## 2017-02-17 NOTE — Progress Notes (Signed)
EPIC Encounter for ICM Monitoring  Patient Name: Paul [name] is a 66 y.o. male Date: 02/17/2017 Primary Care Physican: Annye Asa, MD Primary Cardiologist:[place] Electrophysiologist: Allred Dry Weight:189 lbs      Heart Failure questions reviewed, pt asymptomatic.   Thoracic impedance normal.  Prescribed and confirmed dosage: Furosemide 20 mg 1 tablet (20 mg total) by mouth daily as needed (WEIGHT GAIN).1 tablet (20 mg total) by mouth daily as needed (WEIGHT GAIN).  Recommendations: No changes. Discussed to limit salt intake to 2000 mg/day and fluid intake to < 2 liters/day.  Encouraged to call for fluid symptoms.  Follow-up plan: ICM clinic phone appointment on 03/22/2017.    Copy of ICM check sent to device physician.   3 month ICM trend: 02/17/2017   1 Year ICM trend:  /    [name], RN 02/17/2017 4:05 PM

## 2017-02-17 NOTE — Telephone Encounter (Signed)
LMOVM reminding pt to send remote transmission.   

## 2017-02-25 ENCOUNTER — Other Ambulatory Visit: Payer: Self-pay | Admitting: Nurse Practitioner

## 2017-02-27 ENCOUNTER — Other Ambulatory Visit: Payer: Self-pay | Admitting: *Deleted

## 2017-02-27 MED ORDER — METOPROLOL SUCCINATE ER 100 MG PO TB24: ORAL_TABLET | ORAL | 2 refills | Status: DC

## 2017-02-28 NOTE — Telephone Encounter (Signed)
Medication Detail    Disp Refills Start End   metoprolol succinate (TOPROL-XL) 100 MG 24 hr tablet 135 tablet 2 02/27/2017    Sig: TAKE 100 MG BY MOUTH IN THE AM & 50 MG BY MOUTH IN THE PM   E-Prescribing Status: Receipt confirmed by pharmacy (02/27/2017 5:06 PM EDT)   Pharmacy   [facility], [place]

## 2017-03-02 DIAGNOSIS — L080 Pyoderma: Secondary | ICD-10-CM | POA: Diagnosis not present

## 2017-03-02 DIAGNOSIS — L570 Actinic keratosis: Secondary | ICD-10-CM | POA: Diagnosis not present

## 2017-03-03 ENCOUNTER — Ambulatory Visit (INDEPENDENT_AMBULATORY_CARE_PROVIDER_SITE_OTHER): Payer: PPO | Admitting: Family Medicine

## 2017-03-03 ENCOUNTER — Encounter: Payer: Self-pay | Admitting: Family Medicine

## 2017-03-03 VITALS — BP 119/73 | HR 65 | Temp 98.2°F | Resp 16 | Ht 68.0 in | Wt 189.1 lb

## 2017-03-03 DIAGNOSIS — J0110 Acute frontal sinusitis, unspecified: Secondary | ICD-10-CM

## 2017-03-03 MED ORDER — AMOXICILLIN 875 MG PO TABS: 875.0000 mg | ORAL_TABLET | Freq: Two times a day (BID) | ORAL | 0 refills | Status: DC

## 2017-03-03 MED ORDER — GUAIFENESIN-CODEINE 100-10 MG/5ML PO SYRP: 10.0000 mL | ORAL_SOLUTION | Freq: Three times a day (TID) | ORAL | 0 refills | Status: DC | PRN

## 2017-03-03 NOTE — Patient Instructions (Signed)
Follow up as needed/scheduled Start the Amoxicillin twice daily- take w/ food- for the sinus infection Drink plenty of fluids REST! Use the cough syrup as needed- will cause drowsiness Call with any questions or concerns Hang in there!!!

## 2017-03-03 NOTE — Progress Notes (Signed)
   Subjective:    Patient ID: Paul [name], male    DOB: October 21, 1951, 66 y.o.   MRN: [id number]  HPI URI- sxs started ~2 days ago.  Pt thought it was allergies but yesterday had increased nasal congestion, inability to sleep due to 'hacking cough'.  + HA.  R ear 'popping and cracking- I can hear the fluid in it'.  + PND.  + facial pressure- particularly frontal and behind eyes.     Review of Systems For ROS see HPI     Objective:   Physical Exam  Constitutional: He appears well-developed and well-nourished. No distress.  HENT:  Head: Normocephalic and atraumatic.  Right Ear: Tympanic membrane normal.  Left Ear: Tympanic membrane normal.  Nose: Mucosal edema and rhinorrhea present. Right sinus exhibits maxillary sinus tenderness and frontal sinus tenderness. Left sinus exhibits maxillary sinus tenderness and frontal sinus tenderness.  Mouth/Throat: Mucous membranes are normal. Oropharyngeal exudate and posterior oropharyngeal erythema present. No posterior oropharyngeal edema.  + PND  Eyes: Conjunctivae and EOM are normal. Pupils are equal, round, and reactive to light.  Neck: Normal range of motion. Neck supple.  Cardiovascular: Normal rate, regular rhythm and normal heart sounds.   Pulmonary/Chest: Effort normal and breath sounds normal. No respiratory distress. He has no wheezes.  + hacking cough  Lymphadenopathy:    He has no cervical adenopathy.  Skin: Skin is warm and dry.  Vitals reviewed.         Assessment & Plan:  Frontal sinusitis- new.  Pt's sxs and PE consistent w/ infxn.  Start Amox as this does not interfere w/ Amiodarone.  Cough syrup provided.  Reviewed supportive care and red flags that should prompt return.  Pt expressed understanding and is in agreement w/ plan.

## 2017-03-03 NOTE — Progress Notes (Signed)
Pre visit review using our clinic review tool, if applicable. No additional management support is needed unless otherwise documented below in the visit note. 

## 2017-03-22 ENCOUNTER — Ambulatory Visit (INDEPENDENT_AMBULATORY_CARE_PROVIDER_SITE_OTHER): Payer: PPO

## 2017-03-22 DIAGNOSIS — Z95810 Presence of automatic (implantable) cardiac defibrillator: Secondary | ICD-10-CM

## 2017-03-22 DIAGNOSIS — I5022 Chronic systolic (congestive) heart failure: Secondary | ICD-10-CM

## 2017-03-23 NOTE — Progress Notes (Signed)
EPIC Encounter for ICM Monitoring  Patient Name: Paul [name] is a 66 y.o. male Date: 03/23/2017 Primary Care Physican: Midge Minium, MD Primary [place] Electrophysiologist: Allred Dry Weight:188 lbs                                                 Heart Failure questions reviewed, pt asymptomatic.   Thoracic impedance abnormal suggesting fluid accumulation since 03/12/2017.  He is unsure what has caused the fluid accumulation  Prescribed dosage: Furosemide 20 mg 1 tablet (20 mg total) by mouth daily as needed (WEIGHT GAIN).  Labs: 11/11/2016 Creatinine 1.35, BUN 15, Potassium 4.7, Sodium 136, EGFR 56.23  Recommendations: Advised to take PRN Furosemide x 3 days and then then return to PRN.  Advised to follow low salt diet.  Follow-up plan: ICM clinic phone appointment on 03/28/2017 to recheck fluid levels.  Copy of ICM check sent to device physician.   3 month ICM trend: 03/22/2017   1 Year ICM trend:      [name], RN 03/23/2017 7:53 AM

## 2017-03-28 ENCOUNTER — Ambulatory Visit (INDEPENDENT_AMBULATORY_CARE_PROVIDER_SITE_OTHER): Payer: PPO | Admitting: *Deleted

## 2017-03-28 DIAGNOSIS — I255 Ischemic cardiomyopathy: Secondary | ICD-10-CM | POA: Diagnosis not present

## 2017-03-28 DIAGNOSIS — I5022 Chronic systolic (congestive) heart failure: Secondary | ICD-10-CM

## 2017-03-28 DIAGNOSIS — Z95810 Presence of automatic (implantable) cardiac defibrillator: Secondary | ICD-10-CM

## 2017-03-28 NOTE — Progress Notes (Signed)
Remote ICD transmission.   

## 2017-03-28 NOTE — Progress Notes (Signed)
EPIC Encounter for ICM Monitoring  Patient Name: Paul [name] is a 66 y.o. male Date: 03/28/2017 Primary Care Physican: Midge Minium, MD Primary Cardiologist:[place] Electrophysiologist: Allred Dry Weight:186 lbs  Heart Failure questions reviewed, pt asymptomatic.  Thoracic impedance returned to normal after taking PRN Furosemide  Prescribed dosage: Furosemide 20 mg 1 tablet (20 mg total) by mouth daily as needed (WEIGHT GAIN).  Labs: 11/11/2016 Creatinine 1.35, BUN 15, Potassium 4.7, Sodium 136, EGFR 56.23  Recommendations: No changes.  Encouraged to call for fluid symptoms.  Follow-up plan: ICM clinic phone appointment on 04/24/2017.    Copy of ICM check sent to device physician.   3 month ICM trend: 03/28/2017   1 Year ICM trend:      [name], RN 03/28/2017 4:10 PM

## 2017-03-31 LAB — CUP PACEART REMOTE DEVICE CHECK
Battery Remaining Longevity: 93 mo
Battery Voltage: 2.99 V
Brady Statistic AP VS Percent: 61.41 %
Brady Statistic AS VS Percent: 36.92 %
Brady Statistic RA Percent Paced: 60.32 %
Date Time Interrogation Session: 20180605142604
HighPow Impedance: 71 Ohm
Implantable Lead Implant Date: 20151008
Implantable Lead Location: 753859
Lead Channel Impedance Value: 456 Ohm
Lead Channel Pacing Threshold Amplitude: 0.875 V
Lead Channel Pacing Threshold Amplitude: 1 V
Lead Channel Pacing Threshold Pulse Width: 0.4 ms
Lead Channel Sensing Intrinsic Amplitude: 1.875 mV
Lead Channel Setting Pacing Amplitude: 2.5 V
MDC IDC LEAD IMPLANT DT: 20151008
MDC IDC LEAD LOCATION: 753860
MDC IDC MSMT LEADCHNL RA PACING THRESHOLD PULSEWIDTH: 0.4 ms
MDC IDC MSMT LEADCHNL RA SENSING INTR AMPL: 1.875 mV
MDC IDC MSMT LEADCHNL RV IMPEDANCE VALUE: 304 Ohm
MDC IDC MSMT LEADCHNL RV IMPEDANCE VALUE: 361 Ohm
MDC IDC MSMT LEADCHNL RV SENSING INTR AMPL: 9.75 mV
MDC IDC MSMT LEADCHNL RV SENSING INTR AMPL: 9.75 mV
MDC IDC PG IMPLANT DT: 20151008
MDC IDC SET LEADCHNL RA PACING AMPLITUDE: 2 V
MDC IDC SET LEADCHNL RV PACING PULSEWIDTH: 0.4 ms
MDC IDC SET LEADCHNL RV SENSING SENSITIVITY: 0.3 mV
MDC IDC STAT BRADY AP VP PERCENT: 1.37 %
MDC IDC STAT BRADY AS VP PERCENT: 0.3 %
MDC IDC STAT BRADY RV PERCENT PACED: 1.77 %

## 2017-04-04 ENCOUNTER — Encounter: Payer: Self-pay | Admitting: Cardiology

## 2017-04-18 ENCOUNTER — Ambulatory Visit (INDEPENDENT_AMBULATORY_CARE_PROVIDER_SITE_OTHER): Payer: PRIVATE HEALTH INSURANCE | Admitting: Physician Assistant

## 2017-04-18 ENCOUNTER — Encounter: Payer: Self-pay | Admitting: Physician Assistant

## 2017-04-18 VITALS — BP 128/64 | HR 51 | Temp 98.3°F | Resp 14 | Ht 68.0 in | Wt 189.0 lb

## 2017-04-18 DIAGNOSIS — R109 Unspecified abdominal pain: Secondary | ICD-10-CM | POA: Diagnosis not present

## 2017-04-18 DIAGNOSIS — R140 Abdominal distension (gaseous): Secondary | ICD-10-CM

## 2017-04-18 DIAGNOSIS — G8929 Other chronic pain: Secondary | ICD-10-CM

## 2017-04-18 LAB — POCT URINALYSIS DIPSTICK
BILIRUBIN UA: NEGATIVE
Blood, UA: NEGATIVE
GLUCOSE UA: NEGATIVE
Ketones, UA: NEGATIVE
Leukocytes, UA: NEGATIVE
Nitrite, UA: NEGATIVE
Protein, UA: NEGATIVE
Spec Grav, UA: 1.01 (ref 1.010–1.025)
UROBILINOGEN UA: 0.2 U/dL
pH, UA: 6 (ref 5.0–8.0)

## 2017-04-18 LAB — COMPREHENSIVE METABOLIC PANEL
ALBUMIN: 4.6 g/dL (ref 3.5–5.2)
ALK PHOS: 69 U/L (ref 39–117)
ALT: 64 U/L — AB (ref 0–53)
AST: 65 U/L — ABNORMAL HIGH (ref 0–37)
BILIRUBIN TOTAL: 1 mg/dL (ref 0.2–1.2)
BUN: 11 mg/dL (ref 6–23)
CALCIUM: 9.7 mg/dL (ref 8.4–10.5)
CO2: 30 meq/L (ref 19–32)
Chloride: 100 mEq/L (ref 96–112)
Creatinine, Ser: 1.21 mg/dL (ref 0.40–1.50)
GFR: 63.72 mL/min (ref >60.00)
Glucose, Bld: 101 mg/dL — ABNORMAL HIGH (ref 70–99)
Potassium: 4.6 mEq/L (ref 3.5–5.1)
Sodium: 136 mEq/L (ref 135–145)
TOTAL PROTEIN: 7 g/dL (ref 6.0–8.3)

## 2017-04-18 LAB — CBC WITH DIFFERENTIAL/PLATELET
BASOS PCT: 0.8 % (ref 0.0–3.0)
Basophils Absolute: 0 10*3/uL (ref 0.0–0.1)
EOS PCT: 2.8 % (ref 0.0–5.0)
Eosinophils Absolute: 0.1 10*3/uL (ref 0.0–0.7)
HEMATOCRIT: 44.7 % (ref 39.0–52.0)
HEMOGLOBIN: 15.2 g/dL (ref 13.0–17.0)
LYMPHS PCT: 18 % (ref 12.0–46.0)
Lymphs Abs: 0.8 10*3/uL (ref 0.7–4.0)
MCHC: 34 g/dL (ref 30.0–36.0)
MCV: 96.4 fl (ref 78.0–100.0)
MONO ABS: 0.5 10*3/uL (ref 0.1–1.0)
Monocytes Relative: 11.3 % (ref 3.0–12.0)
NEUTROS ABS: 3 10*3/uL (ref 1.4–7.7)
Neutrophils Relative %: 67.1 % (ref 43.0–77.0)
PLATELETS: 164 10*3/uL (ref 150.0–400.0)
RBC: 4.63 Mil/uL (ref 4.22–5.81)
RDW: 14.3 % (ref 11.5–15.5)
WBC: 4.5 10*3/uL (ref 4.0–10.5)

## 2017-04-18 LAB — H. PYLORI ANTIBODY, IGG: H PYLORI IGG: NEGATIVE

## 2017-04-18 LAB — TSH: TSH: 1.57 u[IU]/mL (ref 0.35–4.50)

## 2017-04-18 MED ORDER — AZELASTINE HCL 0.1 % NA SOLN: 1.0000 | Freq: Two times a day (BID) | NASAL | 12 refills | Status: DC

## 2017-04-18 NOTE — Patient Instructions (Addendum)
Please go to the lab for blood work. I will call with your results.  Please start the Astelin in addition to the Flonase for the eustachian tube dysfunction. If not improving we will need ENT assessment.  Please keep well-hydrated and eat a well-balanced diet. Start a daily probiotic. I am referring you to a specialist giving chronic symptoms and prior negative workup. Again I will call with your results and we will alter regimen accordingly.

## 2017-04-18 NOTE — Progress Notes (Signed)
Pre visit review using our clinic review tool, if applicable. No additional management support is needed unless otherwise documented below in the visit note. 

## 2017-04-18 NOTE — Progress Notes (Signed)
.  Patient presents to clinic today c/o continued ear pressure of R ear with PND s/p treatment for sinusitis about 4 weeks ago. Patient notes all other symptoms of sinusitis have resolved with antibiotic treatment. Patient notes continued ear pressure and popping. Denies drainage from ear. Denies ear pain. Notes mild tinnitus. Denies symptoms of L ear. Denies fever or chills.  Patient also endorses chronic abdominal bloating and pressure x the past 6-8 months, worsening over the past several weeks. Notes 1-2 regular BM each morning but with increased flatulence throughout the day. Denies change to diet or activity level. Has history of GERD, currently on PPI. Denies heart burn, epigastric pain, globus or chronic cough. Denies nausea or vomiting. Prior workup for this has included US of the abdomen in 06/2016 revealing no acute findings or explanation of patient's symptoms.   Past Medical History:  Diagnosis Date  . BPH (benign prostatic hyperplasia)   . CAD (coronary artery disease)    widely patent LAD LIMA graft, widely patent PDA PLR SVG, occluded Diag 1 OM 1 SVG by cardiac CT 2007   . Depression   . GERD (gastroesophageal reflux disease)   . Hyperlipidemia   . IBS (irritable bowel syndrome)   . Ischemic cardiomyopathy    Prior inferior infarct EF 35% by ECHO 07/13/11    . S/P CABG (coronary artery bypass graft)    CABG 06/1998 [place], [place] with LIMA to LAD, SVG to OM, SVG to PD-PL.   OM graft occluded 10/12/2005 by CTA   . Sleep apnea   . Ventricular tachycardia ([place]) 10/15   VT at 188 bpm- unstable requiring external defibrillation    Current Outpatient Prescriptions on File Prior to Visit  Medication Sig Dispense Refill  . amiodarone (PACERONE) 200 MG tablet Take 1 tablet (200 mg total) by mouth daily. 90 tablet 3  . Ascorbic Acid (VITAMIN C) 1000 MG tablet Take 1,000 mg by mouth daily.    Marland Kitchen aspirin EC 81 MG tablet Take 81 mg by mouth daily.    Marland Kitchen atorvastatin (LIPITOR) 80 MG tablet  Take 80 mg by mouth daily.    . cetirizine (ZYRTEC) 10 MG tablet Take 10 mg by mouth daily.    . clopidogrel (PLAVIX) 75 MG tablet Take 75 mg by mouth daily.    . Coenzyme Q10 50 MG CAPS Take 1 capsule by mouth daily.    Marland Kitchen eplerenone (INSPRA) 25 MG tablet Take 25 mg by mouth daily.    . fenofibrate 160 MG tablet Take 160 mg by mouth daily.    . fluticasone (FLONASE) 50 MCG/ACT nasal spray Place 2 sprays into both nostrils daily. 16 g 6  . folic acid (FOLVITE) 224 MCG tablet Take 800 mcg by mouth daily.     . furosemide (LASIX) 20 MG tablet Take 1 tablet (20 mg total) by mouth daily as needed (WEIGHT GAIN). 90 tablet 3  . metoprolol succinate (TOPROL-XL) 100 MG 24 hr tablet TAKE 100 MG BY MOUTH IN THE AM & 50 MG BY MOUTH IN THE PM 135 tablet 2  . Multiple Vitamin (MULTIVITAMIN WITH MINERALS) TABS tablet Take 1 tablet by mouth daily.    . nitroGLYCERIN (NITROSTAT) 0.4 MG SL tablet     . Omega-3 Fatty Acids (FISH OIL) 1000 MG CAPS Take 1 capsule by mouth daily.    Marland Kitchen omeprazole (PRILOSEC) 20 MG capsule TAKE 1 CAPSULE TWICE DAILY BEFORE A MEAL. 180 capsule 1  . ramipril (ALTACE) 10 MG capsule Take 10 mg  by mouth daily.    . Vitamin E 400 UNITS TABS Take 400 Units by mouth daily.     Marland Kitchen zolpidem (AMBIEN) 5 MG tablet Take 1 tablet (5 mg total) by mouth at bedtime as needed for sleep. 30 tablet 3   No current facility-administered medications on file prior to visit.     Allergies  Allergen Reactions  . Carvedilol     Fatigue   . Escitalopram Oxalate     unknown  . Niacin And Related     Flushing   . Spironolactone     gynecomastia    Family History  Problem Relation Age of Onset  . Cerebral aneurysm Father   . Hepatitis Brother   . Stroke Maternal Aunt   . Diabetes Neg Hx   . Heart attack Neg Hx   . Hypertension Neg Hx     Social History   Social History  . Marital status: Married    Spouse name: N/A  . Number of children: N/A  . Years of education: N/A   Social History  Main Topics  . Smoking status: Never Smoker  . Smokeless tobacco: Never Used  . Alcohol use 1.2 oz/week    2 Glasses of wine per week  . Drug use: No  . Sexual activity: Not Asked   Other Topics Concern  . None   Social History Narrative  . None   Review of Systems - See HPI.  All other ROS are negative.  BP 128/64   Pulse (!) 51   Temp 98.3 F (36.8 C) (Oral)   Resp 14   Ht '5\' 8"'  (1.727 m)   Wt 189 lb (85.7 kg)   SpO2 98%   BMI 28.74 kg/m   Physical Exam  Constitutional: He is oriented to person, place, and time and well-developed, well-nourished, and in no distress.  HENT:  Head: Normocephalic and atraumatic.  Eyes: Conjunctivae are normal.  Cardiovascular: Normal rate, regular rhythm, normal heart sounds and intact distal pulses.   Pulmonary/Chest: Effort normal and breath sounds normal. No respiratory distress. He has no wheezes. He has no rales. He exhibits no tenderness.  Abdominal: Soft. Bowel sounds are normal. He exhibits no distension and no mass. There is no rebound and no guarding.  Mild suprapubic tenderness on examination.  Neurological: He is alert and oriented to person, place, and time.  Skin: Skin is warm and dry. No rash noted.  Psychiatric: Affect normal.  Vitals reviewed.   Recent Results (from the past 2160 hour(s))  CUP PACEART REMOTE DEVICE CHECK     Status: None   Collection Time: 03/28/17  2:26 PM  Result Value Ref Range   Date Time Interrogation Session [id number]    Pulse Generator Manufacturer MERM    Pulse Gen Model [id number] Evera XT DR    Pulse Gen Serial Number [id number]    Clinic Name [facility] Pulse Generator Type Implantable Cardiac Defibulator    Implantable Pulse Generator Implant Date 23557322    Implantable Lead Manufacturer [facility]    Implantable Lead Model 5076 CapSureFix Novus    Implantable Lead Serial Number [id number]    Implantable Lead Implant Date [id number]    Implantable Lead Location Detail  1 APPENDAGE    Implantable Lead Location [id number]    Implantable Lead Manufacturer [facility]    Implantable Lead Model 6935 Sprint Quattro Secure S    Implantable Lead Serial Number [id number]    Implantable Lead Implant Date [id number]  Implantable Lead Location Detail 1 APEX    Implantable Lead Location [id number]    Lead Channel Setting Sensing Sensitivity 0.3 mV   Lead Channel Setting Pacing Amplitude 2 V   Lead Channel Setting Pacing Pulse Width 0.4 ms   Lead Channel Setting Pacing Amplitude 2.5 V   Lead Channel Impedance Value 456 ohm   Lead Channel Sensing Intrinsic Amplitude 1.875 mV   Lead Channel Sensing Intrinsic Amplitude 1.875 mV   Lead Channel Pacing Threshold Amplitude 0.875 V   Lead Channel Pacing Threshold Pulse Width 0.4 ms   Lead Channel Impedance Value 361 ohm   Lead Channel Impedance Value 304 ohm   Lead Channel Sensing Intrinsic Amplitude 9.75 mV   Lead Channel Sensing Intrinsic Amplitude 9.75 mV   Lead Channel Pacing Threshold Amplitude 1 V   Lead Channel Pacing Threshold Pulse Width 0.4 ms   HighPow Impedance 71 ohm   Battery Status OK    Battery Remaining Longevity 93 mo   Battery Voltage 2.99 V   Brady Statistic RA Percent Paced 60.32 %   Brady Statistic RV Percent Paced 1.77 %   Brady Statistic AP VP Percent 1.37 %   Brady Statistic AS VP Percent 0.30 %   Brady Statistic AP VS Percent 61.41 %   Brady Statistic AS VS Percent 36.92 %   Eval Rhythm ApVs w/FFRW    Assessment/Plan: 1. Chronic abdominal pain 2. Abdominal bloating Vitals stable today. Patient is afebrile. Acute exacerbation of chronic symptoms without noted change in diet. Examination unremarkable other than small suprapubic pressure on examination. Urine dip unremarkable. No other urinary symptoms. Will obtain lab assessment to include the following: - CBC w/Diff - Comp Met (CMET) - TSH - H. pylori antibody, IgG  Continue PPI. Start BeanO and Probiotic. Dietary recommendations given. Will refer  to GI for chronic symptoms. May need CT but will await lab results first.    Leeanne Rio, PA-C

## 2017-04-19 ENCOUNTER — Other Ambulatory Visit: Payer: Self-pay | Admitting: Physician Assistant

## 2017-04-19 ENCOUNTER — Encounter: Payer: Self-pay | Admitting: Physician Assistant

## 2017-04-19 DIAGNOSIS — R7989 Other specified abnormal findings of blood chemistry: Secondary | ICD-10-CM

## 2017-04-19 DIAGNOSIS — R945 Abnormal results of liver function studies: Secondary | ICD-10-CM

## 2017-04-19 DIAGNOSIS — R109 Unspecified abdominal pain: Principal | ICD-10-CM

## 2017-04-19 DIAGNOSIS — G8929 Other chronic pain: Secondary | ICD-10-CM

## 2017-04-20 ENCOUNTER — Encounter: Payer: Self-pay | Admitting: Physician Assistant

## 2017-04-20 DIAGNOSIS — R109 Unspecified abdominal pain: Principal | ICD-10-CM

## 2017-04-20 DIAGNOSIS — G8929 Other chronic pain: Secondary | ICD-10-CM

## 2017-04-20 NOTE — Telephone Encounter (Signed)
Please see order for CT and see how quickly this can be scheduled. Please reach out to patient once complete. Thank you.

## 2017-04-24 ENCOUNTER — Encounter: Payer: Self-pay | Admitting: Gastroenterology

## 2017-04-24 ENCOUNTER — Ambulatory Visit (INDEPENDENT_AMBULATORY_CARE_PROVIDER_SITE_OTHER): Payer: PPO

## 2017-04-24 ENCOUNTER — Telehealth: Payer: Self-pay | Admitting: *Deleted

## 2017-04-24 DIAGNOSIS — G8929 Other chronic pain: Secondary | ICD-10-CM

## 2017-04-24 DIAGNOSIS — Z95810 Presence of automatic (implantable) cardiac defibrillator: Secondary | ICD-10-CM | POA: Diagnosis not present

## 2017-04-24 DIAGNOSIS — I5022 Chronic systolic (congestive) heart failure: Secondary | ICD-10-CM | POA: Diagnosis not present

## 2017-04-24 DIAGNOSIS — R109 Unspecified abdominal pain: Principal | ICD-10-CM

## 2017-04-24 NOTE — Addendum Note (Signed)
Addended by: Brunetta Jeans on: 04/24/2017 12:01 PM   Modules accepted: Orders

## 2017-04-24 NOTE — Telephone Encounter (Signed)
Paul [name] from [place] called and wanted to know if we could change the abdomen CT to be WITH contrast.   She stated that with the diagnosis listed the contrast would be better.   She stated if there are any questions, we can call her directly at [phone]. If we just change the order, she will be watching for it and give him a call to schedule.    Routed to provider

## 2017-04-24 NOTE — Telephone Encounter (Signed)
Left message with Stanton Kidney that orders have been changed.

## 2017-04-24 NOTE — Progress Notes (Signed)
EPIC Encounter for ICM Monitoring  Patient Name: Paul [name] is a 66 y.o. male Date: 04/24/2017 Primary Care Physican: Midge Minium, MD Primary Cardiologist:[place] Electrophysiologist: Allred Dry Weight:182 lbs      Heart Failure questions reviewed, pt asymptomatic for fluid symptoms.  He's had some abdominal pain for the last couple of months.  He reported liver enzymes are elevated and he thinks abdominal pain increases after taking Amiodarone.  PCP has ordered a abdominal CT Scan.     Thoracic impedance normal.  Prescribed dosage: Furosemide 20 mg 1 tablet (20 mg total) by mouth daily as needed (WEIGHT GAIN).  Labs: 04/18/2017 Creatinine 1.21, BUN 11, Potassium 4.6, Sodium 136, EGFR 63.72 11/11/2016 Creatinine 1.35, BUN 15, Potassium 4.7, Sodium 136, EGFR 56.23  Recommendations: No changes.    Encouraged to call for fluid symptoms.  Follow-up plan: ICM clinic phone appointment on 05/25/2017.    Copy of ICM check sent to device physician.  Patient requested I inform [name], NP of recent abdominal pain and elevated liver enzymes in the event it is related to Amiodarone.  Patient reported he saw her at last office visit.  3 month ICM trend: 04/24/2017   1 Year ICM trend:      [name], RN 04/24/2017 10:11 AM

## 2017-04-24 NOTE — Telephone Encounter (Signed)
Order has been changed. Please notify [facility].

## 2017-05-01 DIAGNOSIS — G4733 Obstructive sleep apnea (adult) (pediatric): Secondary | ICD-10-CM | POA: Diagnosis not present

## 2017-05-03 DIAGNOSIS — G4733 Obstructive sleep apnea (adult) (pediatric): Secondary | ICD-10-CM | POA: Diagnosis not present

## 2017-05-08 DIAGNOSIS — Z85828 Personal history of other malignant neoplasm of skin: Secondary | ICD-10-CM | POA: Diagnosis not present

## 2017-05-08 DIAGNOSIS — L570 Actinic keratosis: Secondary | ICD-10-CM | POA: Diagnosis not present

## 2017-05-09 ENCOUNTER — Ambulatory Visit
Admission: RE | Admit: 2017-05-09 | Discharge: 2017-05-09 | Disposition: A | Discharge status: Home / Self Care | Payer: PPO | Source: Ambulatory Visit | Attending: Physician Assistant | Admitting: Physician Assistant

## 2017-05-09 DIAGNOSIS — R109 Unspecified abdominal pain: Principal | ICD-10-CM

## 2017-05-09 DIAGNOSIS — G8929 Other chronic pain: Secondary | ICD-10-CM

## 2017-05-09 MED ORDER — IOPAMIDOL (ISOVUE-300) INJECTION 61%: 100.0000 mL | Freq: Once | INTRAVENOUS | Status: DC | PRN

## 2017-05-10 ENCOUNTER — Other Ambulatory Visit: Payer: Self-pay | Admitting: Physician Assistant

## 2017-05-10 ENCOUNTER — Encounter: Payer: Self-pay | Admitting: Physician Assistant

## 2017-05-10 ENCOUNTER — Other Ambulatory Visit (INDEPENDENT_AMBULATORY_CARE_PROVIDER_SITE_OTHER): Payer: PPO

## 2017-05-10 DIAGNOSIS — R748 Abnormal levels of other serum enzymes: Secondary | ICD-10-CM | POA: Diagnosis not present

## 2017-05-10 LAB — COMPREHENSIVE METABOLIC PANEL
ALBUMIN: 4.1 g/dL (ref 3.5–5.2)
ALK PHOS: 54 U/L (ref 39–117)
ALT: 25 U/L (ref 0–53)
AST: 23 U/L (ref 0–37)
BILIRUBIN TOTAL: 0.8 mg/dL (ref 0.2–1.2)
BUN: 11 mg/dL (ref 6–23)
CALCIUM: 9.6 mg/dL (ref 8.4–10.5)
CO2: 30 mEq/L (ref 19–32)
Chloride: 105 mEq/L (ref 96–112)
Creatinine, Ser: 1.15 mg/dL (ref 0.40–1.50)
GFR: 67.56 mL/min (ref >60.00)
Glucose, Bld: 90 mg/dL (ref 70–99)
Potassium: 4.3 mEq/L (ref 3.5–5.1)
Sodium: 140 mEq/L (ref 135–145)
TOTAL PROTEIN: 6.5 g/dL (ref 6.0–8.3)

## 2017-05-22 ENCOUNTER — Other Ambulatory Visit: Payer: Self-pay | Admitting: Family Medicine

## 2017-05-22 NOTE — Telephone Encounter (Signed)
Last OV 04/18/17 Zolpidem last filled 01/18/17 #30 with 3

## 2017-05-22 NOTE — Telephone Encounter (Signed)
Medication filled to pharmacy as requested.   

## 2017-05-25 ENCOUNTER — Ambulatory Visit (INDEPENDENT_AMBULATORY_CARE_PROVIDER_SITE_OTHER): Payer: PPO

## 2017-05-25 DIAGNOSIS — I5022 Chronic systolic (congestive) heart failure: Secondary | ICD-10-CM | POA: Diagnosis not present

## 2017-05-25 DIAGNOSIS — Z95810 Presence of automatic (implantable) cardiac defibrillator: Secondary | ICD-10-CM | POA: Diagnosis not present

## 2017-05-25 NOTE — Progress Notes (Signed)
EPIC Encounter for ICM Monitoring  Patient Name: Paul [name] is a 66 y.o. male Date: 05/25/2017 Primary Care Physican: Midge Minium, MD Primary [place] Electrophysiologist: Allred Dry Weight:182lbs                                                Heart Failure questions reviewed, pt asymptomatic .     Thoracic impedance abnormal suggesting fluid accumulation since 04/25/2017 and close to being at baseline today..  Prescribed dosage: Furosemide 20 mg 1 tablet (20 mg total) by mouth daily as needed (WEIGHT GAIN).  Labs: 04/18/2017 Creatinine 1.21, BUN 11, Potassium 4.6, Sodium 136, EGFR 63.72 11/11/2016 Creatinine 1.35, BUN 15, Potassium 4.7, Sodium 136, EGFR 56.23  Recommendations:  Advised to take Furosemide 20 mg 1 tablet for the next 2 days and then return to PRN.  Patient will be out of town all next week.   Follow-up plan: ICM clinic phone appointment on 06/06/2017 to recheck fluid levels  Office appointment scheduled 06/08/2017 with Dr. Wynonia Lawman.  Copy of ICM check sent to device physician.   3 month ICM trend: 05/25/2017   1 Year ICM trend:      [name], RN 05/25/2017 3:34 PM

## 2017-06-06 ENCOUNTER — Ambulatory Visit (INDEPENDENT_AMBULATORY_CARE_PROVIDER_SITE_OTHER): Payer: Self-pay

## 2017-06-06 DIAGNOSIS — N528 Other male erectile dysfunction: Secondary | ICD-10-CM | POA: Diagnosis not present

## 2017-06-06 DIAGNOSIS — G4733 Obstructive sleep apnea (adult) (pediatric): Secondary | ICD-10-CM | POA: Diagnosis not present

## 2017-06-06 DIAGNOSIS — I2510 Atherosclerotic heart disease of native coronary artery without angina pectoris: Secondary | ICD-10-CM | POA: Diagnosis not present

## 2017-06-06 DIAGNOSIS — Z951 Presence of aortocoronary bypass graft: Secondary | ICD-10-CM | POA: Diagnosis not present

## 2017-06-06 DIAGNOSIS — I472 Ventricular tachycardia: Secondary | ICD-10-CM | POA: Diagnosis not present

## 2017-06-06 DIAGNOSIS — E785 Hyperlipidemia, unspecified: Secondary | ICD-10-CM | POA: Diagnosis not present

## 2017-06-06 DIAGNOSIS — I5022 Chronic systolic (congestive) heart failure: Secondary | ICD-10-CM

## 2017-06-06 DIAGNOSIS — Z95810 Presence of automatic (implantable) cardiac defibrillator: Secondary | ICD-10-CM | POA: Diagnosis not present

## 2017-06-06 DIAGNOSIS — I255 Ischemic cardiomyopathy: Secondary | ICD-10-CM | POA: Diagnosis not present

## 2017-06-06 NOTE — Progress Notes (Signed)
EPIC Encounter for ICM Monitoring  Patient Name: Paul [name] is a 66 y.o. male Date: 06/06/2017 Primary Care Physican: Tabori, Katherine E, MD Primary Cardiologist:[place] Electrophysiologist: Allred Dry Weight:183lbs  Heart Failure questions reviewed, pt asymptomatic.   Thoracic impedance returned to normal after taking PRN Furosemide x 2 days  Prescribed dosage: Furosemide 20 mg 1 tablet (20 mg total) by mouth daily as needed (WEIGHT GAIN).  Labs: 04/18/2017 Creatinine 1.21, BUN 11, Potassium 4.6, Sodium 136, EGFR 63.72 11/11/2016 Creatinine 1.35, BUN 15, Potassium 4.7, Sodium 136, EGFR 56.23  Recommendations: No changes.  Encouraged to call for fluid symptoms.  Follow-up plan: ICM clinic phone appointment on 06/27/2017.    Copy of ICM check sent to device physician.   3 month ICM trend: 06/06/2017   1 Year ICM trend:      Laurie S Short, RN 06/06/2017 9:38 AM    

## 2017-06-13 ENCOUNTER — Other Ambulatory Visit: Payer: Self-pay | Admitting: Family Medicine

## 2017-06-19 ENCOUNTER — Ambulatory Visit: Payer: PPO | Admitting: Gastroenterology

## 2017-06-22 ENCOUNTER — Other Ambulatory Visit: Payer: Self-pay | Admitting: Family Medicine

## 2017-06-22 MED ORDER — ZOLPIDEM TARTRATE 5 MG PO TABS: 5.0000 mg | ORAL_TABLET | Freq: Every evening | ORAL | 3 refills | Status: DC | PRN

## 2017-06-22 NOTE — Telephone Encounter (Signed)
Last OV 04/18/17 (cody) ambien last filled 05/22/17 #30 with 0

## 2017-06-22 NOTE — Telephone Encounter (Signed)
Medication filled to pharmacy as requested.   

## 2017-06-27 ENCOUNTER — Ambulatory Visit (INDEPENDENT_AMBULATORY_CARE_PROVIDER_SITE_OTHER): Payer: PPO | Admitting: *Deleted

## 2017-06-27 DIAGNOSIS — Z95810 Presence of automatic (implantable) cardiac defibrillator: Secondary | ICD-10-CM | POA: Diagnosis not present

## 2017-06-27 DIAGNOSIS — I255 Ischemic cardiomyopathy: Secondary | ICD-10-CM | POA: Diagnosis not present

## 2017-06-27 DIAGNOSIS — I5022 Chronic systolic (congestive) heart failure: Secondary | ICD-10-CM

## 2017-06-27 NOTE — Progress Notes (Signed)
EPIC Encounter for ICM Monitoring  Patient Name: Paul [name] is a 66 y.o. male Date: 06/27/2017 Primary Care Physican: Midge Minium, MD Primary [place] Electrophysiologist: Allred Dry Weight:185lbs      Heart Failure questions reviewed, pt asymptomatic.   Thoracic impedance normal.  Prescribed dosage: Furosemide 20 mg 1 tablet (20 mg total) by mouth daily as needed (WEIGHT GAIN).  Labs: 04/18/2017 Creatinine 1.21, BUN 11, Potassium 4.6, Sodium 136, EGFR 63.72 11/11/2016 Creatinine 1.35, BUN 15, Potassium 4.7, Sodium 136, EGFR 56.23  Recommendations: No changes.  Advised to limit salt intake to 2000 mg/day and fluid intake to < 2 liters/day.  Encouraged to call for fluid symptoms.  Follow-up plan: ICM clinic phone appointment on 07/28/2017.    Copy of ICM check sent to Dr. Rayann Heman.   3 month ICM trend: 06/27/2017   1 Year ICM trend:      [name], RN 06/27/2017 12:16 PM

## 2017-06-27 NOTE — Progress Notes (Signed)
ICD Remote Transmission  

## 2017-06-28 DIAGNOSIS — I5042 Chronic combined systolic (congestive) and diastolic (congestive) heart failure: Secondary | ICD-10-CM | POA: Diagnosis not present

## 2017-06-28 DIAGNOSIS — R5383 Other fatigue: Secondary | ICD-10-CM | POA: Diagnosis not present

## 2017-06-28 DIAGNOSIS — R109 Unspecified abdominal pain: Secondary | ICD-10-CM | POA: Diagnosis not present

## 2017-06-28 DIAGNOSIS — I10 Essential (primary) hypertension: Secondary | ICD-10-CM | POA: Diagnosis not present

## 2017-06-28 DIAGNOSIS — E538 Deficiency of other specified B group vitamins: Secondary | ICD-10-CM | POA: Diagnosis not present

## 2017-07-05 ENCOUNTER — Encounter: Payer: Self-pay | Admitting: Cardiology

## 2017-07-06 LAB — CUP PACEART REMOTE DEVICE CHECK
Brady Statistic AP VP Percent: 0.15 %
Brady Statistic AP VS Percent: 67.34 %
Brady Statistic AS VS Percent: 32.44 %
Brady Statistic RV Percent Paced: 0.27 %
Date Time Interrogation Session: 20180904103524
HIGH POWER IMPEDANCE MEASURED VALUE: 64 Ohm
Implantable Lead Implant Date: 20151008
Implantable Lead Implant Date: 20151008
Implantable Lead Location: 753859
Lead Channel Impedance Value: 285 Ohm
Lead Channel Impedance Value: 456 Ohm
Lead Channel Pacing Threshold Amplitude: 1.125 V
Lead Channel Pacing Threshold Pulse Width: 0.4 ms
Lead Channel Sensing Intrinsic Amplitude: 9 mV
Lead Channel Sensing Intrinsic Amplitude: 9 mV
Lead Channel Setting Pacing Amplitude: 2 V
Lead Channel Setting Pacing Amplitude: 2.5 V
Lead Channel Setting Sensing Sensitivity: 0.3 mV
MDC IDC LEAD LOCATION: 753860
MDC IDC MSMT BATTERY REMAINING LONGEVITY: 87 mo
MDC IDC MSMT BATTERY VOLTAGE: 2.99 V
MDC IDC MSMT LEADCHNL RA PACING THRESHOLD AMPLITUDE: 1 V
MDC IDC MSMT LEADCHNL RA PACING THRESHOLD PULSEWIDTH: 0.4 ms
MDC IDC MSMT LEADCHNL RA SENSING INTR AMPL: 1.375 mV
MDC IDC MSMT LEADCHNL RA SENSING INTR AMPL: 1.375 mV
MDC IDC MSMT LEADCHNL RV IMPEDANCE VALUE: 342 Ohm
MDC IDC PG IMPLANT DT: 20151008
MDC IDC SET LEADCHNL RV PACING PULSEWIDTH: 0.4 ms
MDC IDC STAT BRADY AS VP PERCENT: 0.08 %
MDC IDC STAT BRADY RA PERCENT PACED: 66.86 %

## 2017-07-12 ENCOUNTER — Ambulatory Visit (INDEPENDENT_AMBULATORY_CARE_PROVIDER_SITE_OTHER): Payer: PPO | Admitting: Adult Health

## 2017-07-12 ENCOUNTER — Encounter: Payer: Self-pay | Admitting: Adult Health

## 2017-07-12 DIAGNOSIS — G4733 Obstructive sleep apnea (adult) (pediatric): Secondary | ICD-10-CM | POA: Diagnosis not present

## 2017-07-12 NOTE — Assessment & Plan Note (Signed)
Controlled on C Pap Try gel mask   Plan  Patient Instructions  Continue on CPAP At bedtime .  Order for new dreamwear gel mask.  Goal is for at least 4-6hr each night  Do not drive if sleepy.  Follow up with Dr. Elsworth Soho  In 1 year and As needed

## 2017-07-12 NOTE — Progress Notes (Signed)
@Patient  ID: Paul [name], male    DOB: 1950/12/04, 66 y.o.   MRN: [id number]  Chief Complaint  Patient presents with  . Follow-up    OSA    Referring provider: Midge Minium, MD  HPI: 66 year old male followed for obstructive sleep apnea Hx of CM with ICD , CHF   TEST  Sleep study 2017 AHI 24   07/12/2017 Follow up ; OSA  Patient returns for a follow-up for sleep apnea. Patient says he is doing okay on C Pap. However, his mask is very irritating to him. He's tried multiple masks without much improvement. Patient does have appeared and mustache which causes leak issues. Download shows excellent compliance with average usage at 6 hours. AHI 1.2. Positive leaks. Patient on C Pap 11 cm H2O.  Allergies  Allergen Reactions  . Carvedilol     Fatigue   . Escitalopram Oxalate     unknown  . Niacin And Related     Flushing   . Spironolactone     gynecomastia    Immunization History  Administered Date(s) Administered  . Influenza Split 08/10/2015  . Influenza,inj,Quad PF,6+ Mos 07/29/2016  . Pneumococcal Conjugate-13 06/30/2016    Past Medical History:  Diagnosis Date  . BPH (benign prostatic hyperplasia)   . CAD (coronary artery disease)    widely patent LAD LIMA graft, widely patent PDA PLR SVG, occluded Diag 1 OM 1 SVG by cardiac CT 2007   . Depression   . GERD (gastroesophageal reflux disease)   . Hyperlipidemia   . IBS (irritable bowel syndrome)   . Ischemic cardiomyopathy    Prior inferior infarct EF 35% by ECHO 07/13/11    . S/P CABG (coronary artery bypass graft)    CABG 06/1998 [place] with LIMA to LAD, SVG to OM, SVG to PD-PL.   OM graft occluded 10/12/2005 by CTA   . Sleep apnea   . Ventricular tachycardia ([place]) 10/15   VT at 188 bpm- unstable requiring external defibrillation    Tobacco History: History  Smoking Status  . Never Smoker  Smokeless Tobacco  . Never Used   Counseling given: Not Answered   Outpatient Encounter  Prescriptions as of 07/12/2017  Medication Sig  . amiodarone (PACERONE) 200 MG tablet Take 1 tablet (200 mg total) by mouth daily.  . Ascorbic Acid (VITAMIN C) 1000 MG tablet Take 1,000 mg by mouth daily.  Marland Kitchen aspirin EC 81 MG tablet Take 81 mg by mouth daily.  Marland Kitchen atorvastatin (LIPITOR) 80 MG tablet Take 80 mg by mouth daily.  Marland Kitchen azelastine (ASTELIN) 0.1 % nasal spray Place 1 spray into both nostrils 2 (two) times daily. Use in each nostril as directed  . cetirizine (ZYRTEC) 10 MG tablet Take 10 mg by mouth daily.  . clopidogrel (PLAVIX) 75 MG tablet Take 75 mg by mouth daily.  . Coenzyme Q10 50 MG CAPS Take 1 capsule by mouth daily.  Marland Kitchen eplerenone (INSPRA) 25 MG tablet Take 25 mg by mouth daily.  . fenofibrate 160 MG tablet Take 160 mg by mouth daily.  . fluticasone (FLONASE) 50 MCG/ACT nasal spray Place 2 sprays into both nostrils daily.  . folic acid (FOLVITE) 858 MCG tablet Take 800 mcg by mouth daily.   . metoprolol succinate (TOPROL-XL) 100 MG 24 hr tablet TAKE 100 MG BY MOUTH IN THE AM & 50 MG BY MOUTH IN THE PM  . Multiple Vitamin (MULTIVITAMIN WITH MINERALS) TABS tablet Take 1 tablet by mouth daily.  Marland Kitchen  nitroGLYCERIN (NITROSTAT) 0.4 MG SL tablet   . Omega-3 Fatty Acids (FISH OIL) 1000 MG CAPS Take 1 capsule by mouth daily.  Marland Kitchen omeprazole (PRILOSEC) 20 MG capsule TAKE 1 CAPSULE TWICE DAILY BEFORE A MEAL.  . ramipril (ALTACE) 10 MG capsule Take 10 mg by mouth daily.  . Vitamin E 400 UNITS TABS Take 400 Units by mouth daily.   Marland Kitchen zolpidem (AMBIEN) 5 MG tablet Take 1 tablet (5 mg total) by mouth at bedtime as needed for sleep.  . furosemide (LASIX) 20 MG tablet Take 1 tablet (20 mg total) by mouth daily as needed (WEIGHT GAIN).   No facility-administered encounter medications on file as of 07/12/2017.      Review of Systems  Constitutional:   No  weight loss, night sweats,  Fevers, chills, fatigue, or  lassitude.  HEENT:   No headaches,  Difficulty swallowing,  Tooth/dental problems, or   Sore throat,                No sneezing, itching, ear ache, nasal congestion, post nasal drip,   CV:  No chest pain,  Orthopnea, PND, swelling in lower extremities, anasarca, dizziness, palpitations, syncope.   GI  No heartburn, indigestion, abdominal pain, nausea, vomiting, diarrhea, change in bowel habits, loss of appetite, bloody stools.   Resp: No shortness of breath with exertion or at rest.  No excess mucus, no productive cough,  No non-productive cough,  No coughing up of blood.  No change in color of mucus.  No wheezing.  No chest wall deformity  Skin: no rash or lesions.  GU: no dysuria, change in color of urine, no urgency or frequency.  No flank pain, no hematuria   MS:  No joint pain or swelling.  No decreased range of motion.  No back pain.    Physical Exam  BP 112/64 (BP Location: Left Arm, Cuff Size: Normal)   Pulse (!) 54   Ht 5\' 8"  (1.727 m)   Wt 184 lb 12.8 oz (83.8 kg)   SpO2 96%   BMI 28.10 kg/m   GEN: A/Ox3; pleasant , NAD, well nourished    HEENT:  [place]/AT,  EACs-clear, TMs-wnl, NOSE-clear, THROAT-clear, no lesions, no postnasal drip or exudate noted. Class 2-3 MP airway   NECK:  Supple w/ fair ROM; no JVD; normal carotid impulses w/o bruits; no thyromegaly or nodules palpated; no lymphadenopathy.    RESP  Clear  P & A; w/o, wheezes/ rales/ or rhonchi. no accessory muscle use, no dullness to percussion  CARD:  RRR, no m/r/g, no peripheral edema, pulses intact, no cyanosis or clubbing.  GI:   Soft & nt; nml bowel sounds; no organomegaly or masses detected.   Musco: Warm bil, no deformities or joint swelling noted.   Neuro: alert, no focal deficits noted.    Skin: Warm, no lesions or rashes    Lab Results:  CBC   Imaging: No results found.   Assessment & Plan:   OSA (obstructive sleep apnea) Controlled on C Pap Try gel mask   Plan  Patient Instructions  Continue on CPAP At bedtime .  Order for new dreamwear gel mask.  Goal is for at  least 4-6hr each night  Do not drive if sleepy.  Follow up with Dr. Elsworth Soho  In 1 year and As needed           Rexene Edison, NP 07/12/2017

## 2017-07-12 NOTE — Patient Instructions (Addendum)
Continue on CPAP At bedtime .  Order for new dreamwear gel mask.  Goal is for at least 4-6hr each night  Do not drive if sleepy.  Follow up with Dr. Elsworth Soho  In 1 year and As needed

## 2017-07-13 NOTE — Addendum Note (Signed)
Addended by: Parke Poisson E on: 07/13/2017 05:28 PM   Modules accepted: Orders

## 2017-07-17 ENCOUNTER — Other Ambulatory Visit: Payer: Self-pay

## 2017-07-17 DIAGNOSIS — C44319 Basal cell carcinoma of skin of other parts of face: Secondary | ICD-10-CM | POA: Diagnosis not present

## 2017-07-17 DIAGNOSIS — L570 Actinic keratosis: Secondary | ICD-10-CM | POA: Diagnosis not present

## 2017-07-17 DIAGNOSIS — C4491 Basal cell carcinoma of skin, unspecified: Secondary | ICD-10-CM

## 2017-07-17 HISTORY — DX: Basal cell carcinoma of skin, unspecified: C44.91

## 2017-07-25 DIAGNOSIS — E538 Deficiency of other specified B group vitamins: Secondary | ICD-10-CM | POA: Diagnosis not present

## 2017-07-28 ENCOUNTER — Telehealth: Payer: Self-pay

## 2017-07-28 ENCOUNTER — Ambulatory Visit (INDEPENDENT_AMBULATORY_CARE_PROVIDER_SITE_OTHER): Payer: PPO

## 2017-07-28 DIAGNOSIS — I5022 Chronic systolic (congestive) heart failure: Secondary | ICD-10-CM

## 2017-07-28 DIAGNOSIS — Z95810 Presence of automatic (implantable) cardiac defibrillator: Secondary | ICD-10-CM | POA: Diagnosis not present

## 2017-07-28 NOTE — Telephone Encounter (Signed)
Remote ICM transmission received.  Attempted call to patient and left detailed message regarding transmission and next ICM scheduled for 08/28/2017.  Advised to return call for any fluid symptoms or questions.

## 2017-07-28 NOTE — Progress Notes (Signed)
EPIC Encounter for ICM Monitoring  Patient Name: Paul [name] is a 66 y.o. male Date: 07/28/2017 Primary Care Physican: Midge Minium, MD Primary [place] Electrophysiologist: Allred Dry Weight:Previous weight 185lbs        Attempted call to patient and unable to reach.  Left detailed message regarding transmission.  Transmission reviewed.    Thoracic impedance normal.  Prescribed dosage: Furosemide 20 mg 1 tablet (20 mg total) by mouth daily as needed (WEIGHT GAIN).  Labs: 04/18/2017 Creatinine 1.21, BUN 11, Potassium 4.6, Sodium 136, EGFR 63.72 11/11/2016 Creatinine 1.35, BUN 15, Potassium 4.7, Sodium 136, EGFR 56.23  Recommendations: Left voice mail with ICM number and encouraged to call if experiencing fluid symptoms.  Follow-up plan: ICM clinic phone appointment on 08/28/2017.   Copy of ICM check sent to Dr. Rayann Heman.   3 month ICM trend: 07/28/2017   1 Year ICM trend:      [name], RN 07/28/2017 8:58 AM

## 2017-08-01 DIAGNOSIS — E538 Deficiency of other specified B group vitamins: Secondary | ICD-10-CM | POA: Diagnosis not present

## 2017-08-01 DIAGNOSIS — E785 Hyperlipidemia, unspecified: Secondary | ICD-10-CM | POA: Diagnosis not present

## 2017-08-07 DIAGNOSIS — G4733 Obstructive sleep apnea (adult) (pediatric): Secondary | ICD-10-CM | POA: Diagnosis not present

## 2017-08-08 DIAGNOSIS — E538 Deficiency of other specified B group vitamins: Secondary | ICD-10-CM | POA: Diagnosis not present

## 2017-08-17 DIAGNOSIS — C44319 Basal cell carcinoma of skin of other parts of face: Secondary | ICD-10-CM | POA: Diagnosis not present

## 2017-08-21 DIAGNOSIS — G4733 Obstructive sleep apnea (adult) (pediatric): Secondary | ICD-10-CM | POA: Diagnosis not present

## 2017-08-28 ENCOUNTER — Ambulatory Visit (INDEPENDENT_AMBULATORY_CARE_PROVIDER_SITE_OTHER): Payer: PPO

## 2017-08-28 DIAGNOSIS — I5022 Chronic systolic (congestive) heart failure: Secondary | ICD-10-CM | POA: Diagnosis not present

## 2017-08-28 DIAGNOSIS — Z95810 Presence of automatic (implantable) cardiac defibrillator: Secondary | ICD-10-CM | POA: Diagnosis not present

## 2017-08-28 NOTE — Progress Notes (Signed)
EPIC Encounter for ICM Monitoring  Patient Name: Paul [name] is a 66 y.o. male Date: 08/28/2017 Primary Care Physican: Midge Minium, MD Primary [place] Electrophysiologist: Allred Dry Weight:Previous weight 185lbs      Spoke with wife, DPR due to patient not home. Heart Failure questions reviewed, pt asymptomatic.   Thoracic impedance normal.  Prescribed dosage: Furosemide 20 mg 1 tablet (20 mg total) by mouth daily as needed (WEIGHT GAIN).  Labs: 04/18/2017 Creatinine 1.21, BUN 11, Potassium 4.6, Sodium 136, EGFR 63.72 11/11/2016 Creatinine 1.35, BUN 15, Potassium 4.7, Sodium 136, EGFR 56.23  Recommendations: No changes.  Encouraged to call for fluid symptoms.  Follow-up plan: ICM clinic phone appointment on 09/28/2017.    Copy of ICM check sent to Dr. Rayann Heman.   3 month ICM trend: 08/28/2017   1 Year ICM trend:      [name], RN 08/28/2017 2:09 PM

## 2017-09-07 DIAGNOSIS — E538 Deficiency of other specified B group vitamins: Secondary | ICD-10-CM | POA: Diagnosis not present

## 2017-09-28 ENCOUNTER — Ambulatory Visit (INDEPENDENT_AMBULATORY_CARE_PROVIDER_SITE_OTHER): Payer: PPO | Admitting: *Deleted

## 2017-09-28 ENCOUNTER — Telehealth: Payer: Self-pay

## 2017-09-28 DIAGNOSIS — I5022 Chronic systolic (congestive) heart failure: Secondary | ICD-10-CM | POA: Diagnosis not present

## 2017-09-28 DIAGNOSIS — Z95810 Presence of automatic (implantable) cardiac defibrillator: Secondary | ICD-10-CM | POA: Diagnosis not present

## 2017-09-28 DIAGNOSIS — I255 Ischemic cardiomyopathy: Secondary | ICD-10-CM

## 2017-09-28 NOTE — Telephone Encounter (Signed)
Remote ICM transmission received.  Attempted call to patient and left detailed message per DPR regarding transmission and next ICM scheduled for 11/02/2017.  Advised to return call for any fluid symptoms or questions.   

## 2017-09-28 NOTE — Progress Notes (Signed)
Remote ICD transmission.   

## 2017-09-28 NOTE — Progress Notes (Signed)
EPIC Encounter for ICM Monitoring  Patient Name: Paul [name] is a 66 y.o. male Date: 09/28/2017 Primary Care Physican: Midge Minium, MD Primary [place] Electrophysiologist: Allred Dry Weight:Previous [id number]        Attempted call to patient and unable to reach.  Left detailed message regarding transmission.  Transmission reviewed.    Thoracic impedance normal.  Prescribed dosage: Furosemide 20 mg 1 tablet (20 mg total) by mouth daily as needed (WEIGHT GAIN).  Labs: 04/18/2017 Creatinine 1.21, BUN 11, Potassium 4.6, Sodium 136, EGFR 63.72 11/11/2016 Creatinine 1.35, BUN 15, Potassium 4.7, Sodium 136, EGFR 56.23  Recommendations:  Left voice mail with ICM number and encouraged to call if experiencing any fluid symptoms..  Follow-up plan: ICM clinic phone appointment on 11/02/2017.    Copy of ICM check sent to Dr. Rayann Heman.   3 month ICM trend: 09/28/2017    1 Year ICM trend:       [name], RN 09/28/2017 11:43 AM

## 2017-10-03 LAB — CUP PACEART REMOTE DEVICE CHECK
Battery Voltage: 2.99 V
Brady Statistic AP VP Percent: 0.07 %
Brady Statistic AS VS Percent: 42.14 %
Brady Statistic RA Percent Paced: 57.37 %
Brady Statistic RV Percent Paced: 0.11 %
HIGH POWER IMPEDANCE MEASURED VALUE: 83 Ohm
Implantable Lead Implant Date: 20151008
Implantable Lead Location: 753860
Implantable Lead Model: 5076
Implantable Lead Model: 6935
Implantable Pulse Generator Implant Date: 20151008
Lead Channel Impedance Value: 475 Ohm
Lead Channel Pacing Threshold Pulse Width: 0.4 ms
Lead Channel Pacing Threshold Pulse Width: 0.4 ms
Lead Channel Sensing Intrinsic Amplitude: 1.5 mV
Lead Channel Sensing Intrinsic Amplitude: 1.5 mV
Lead Channel Sensing Intrinsic Amplitude: 9.375 mV
Lead Channel Setting Pacing Amplitude: 2 V
MDC IDC LEAD IMPLANT DT: 20151008
MDC IDC LEAD LOCATION: 753859
MDC IDC MSMT BATTERY REMAINING LONGEVITY: 86 mo
MDC IDC MSMT LEADCHNL RA PACING THRESHOLD AMPLITUDE: 0.875 V
MDC IDC MSMT LEADCHNL RV IMPEDANCE VALUE: 285 Ohm
MDC IDC MSMT LEADCHNL RV IMPEDANCE VALUE: 342 Ohm
MDC IDC MSMT LEADCHNL RV PACING THRESHOLD AMPLITUDE: 1 V
MDC IDC MSMT LEADCHNL RV SENSING INTR AMPL: 9.375 mV
MDC IDC SESS DTM: 20181206072504
MDC IDC SET LEADCHNL RV PACING AMPLITUDE: 2.5 V
MDC IDC SET LEADCHNL RV PACING PULSEWIDTH: 0.4 ms
MDC IDC SET LEADCHNL RV SENSING SENSITIVITY: 0.3 mV
MDC IDC STAT BRADY AP VS PERCENT: 57.76 %
MDC IDC STAT BRADY AS VP PERCENT: 0.03 %

## 2017-10-06 ENCOUNTER — Encounter: Payer: Self-pay | Admitting: Cardiology

## 2017-10-25 DIAGNOSIS — E538 Deficiency of other specified B group vitamins: Secondary | ICD-10-CM | POA: Diagnosis not present

## 2017-10-25 DIAGNOSIS — I5042 Chronic combined systolic (congestive) and diastolic (congestive) heart failure: Secondary | ICD-10-CM | POA: Diagnosis not present

## 2017-10-25 DIAGNOSIS — E785 Hyperlipidemia, unspecified: Secondary | ICD-10-CM | POA: Diagnosis not present

## 2017-10-25 DIAGNOSIS — J0100 Acute maxillary sinusitis, unspecified: Secondary | ICD-10-CM | POA: Diagnosis not present

## 2017-10-25 DIAGNOSIS — I10 Essential (primary) hypertension: Secondary | ICD-10-CM | POA: Diagnosis not present

## 2017-11-02 ENCOUNTER — Ambulatory Visit (INDEPENDENT_AMBULATORY_CARE_PROVIDER_SITE_OTHER): Payer: PPO

## 2017-11-02 DIAGNOSIS — I5022 Chronic systolic (congestive) heart failure: Secondary | ICD-10-CM

## 2017-11-02 DIAGNOSIS — Z95810 Presence of automatic (implantable) cardiac defibrillator: Secondary | ICD-10-CM | POA: Diagnosis not present

## 2017-11-02 NOTE — Progress Notes (Signed)
EPIC Encounter for ICM Monitoring  Patient Name: Paul [name] is a 67 y.o. male Date: 11/02/2017 Primary Care Physican: Midge Minium, MD Primary [place] Electrophysiologist: Allred Dry Weight:183lbs       Heart Failure questions reviewed, pt asymptomatic.   Thoracic impedance normal.  Prescribed dosage: Furosemide 20 mg 1 tablet (20 mg total) by mouth daily as needed (WEIGHT GAIN).  Labs: 05/10/2017 Creatinine 1.15, BUN 11, Potassium 4.3, Sodium 140, EGFR 67.56 04/18/2017 Creatinine 1.21, BUN 11, Potassium 4.6, Sodium 136, EGFR 63.72 11/11/2016 Creatinine 1.35, BUN 15, Potassium 4.7, Sodium 136, EGFR 56.23  Recommendations: No changes.   Encouraged to call for fluid symptoms.  Follow-up plan: ICM clinic phone appointment on 01/11/2018.  Office appointment scheduled 12/11/2017 with Dr. Rayann Heman.  Copy of ICM check sent to Dr. Rayann Heman.   3 month ICM trend: 11/02/2017    1 Year ICM trend:       [name], RN 11/02/2017 10:23 AM

## 2017-11-16 DIAGNOSIS — Z85828 Personal history of other malignant neoplasm of skin: Secondary | ICD-10-CM | POA: Diagnosis not present

## 2017-11-16 DIAGNOSIS — L570 Actinic keratosis: Secondary | ICD-10-CM | POA: Diagnosis not present

## 2017-11-27 DIAGNOSIS — E538 Deficiency of other specified B group vitamins: Secondary | ICD-10-CM | POA: Diagnosis not present

## 2017-12-07 DIAGNOSIS — Z95810 Presence of automatic (implantable) cardiac defibrillator: Secondary | ICD-10-CM | POA: Diagnosis not present

## 2017-12-07 DIAGNOSIS — N528 Other male erectile dysfunction: Secondary | ICD-10-CM | POA: Diagnosis not present

## 2017-12-07 DIAGNOSIS — Z951 Presence of aortocoronary bypass graft: Secondary | ICD-10-CM | POA: Diagnosis not present

## 2017-12-07 DIAGNOSIS — G4733 Obstructive sleep apnea (adult) (pediatric): Secondary | ICD-10-CM | POA: Diagnosis not present

## 2017-12-07 DIAGNOSIS — I2510 Atherosclerotic heart disease of native coronary artery without angina pectoris: Secondary | ICD-10-CM | POA: Diagnosis not present

## 2017-12-07 DIAGNOSIS — I255 Ischemic cardiomyopathy: Secondary | ICD-10-CM | POA: Diagnosis not present

## 2017-12-07 DIAGNOSIS — E7849 Other hyperlipidemia: Secondary | ICD-10-CM | POA: Diagnosis not present

## 2017-12-07 DIAGNOSIS — I472 Ventricular tachycardia: Secondary | ICD-10-CM | POA: Diagnosis not present

## 2017-12-11 ENCOUNTER — Ambulatory Visit: Payer: PPO | Admitting: Internal Medicine

## 2017-12-11 ENCOUNTER — Encounter: Payer: Self-pay | Admitting: Internal Medicine

## 2017-12-11 VITALS — BP 130/64 | HR 53 | Ht 68.0 in | Wt 186.0 lb

## 2017-12-11 DIAGNOSIS — I5022 Chronic systolic (congestive) heart failure: Secondary | ICD-10-CM

## 2017-12-11 DIAGNOSIS — Z95810 Presence of automatic (implantable) cardiac defibrillator: Secondary | ICD-10-CM | POA: Diagnosis not present

## 2017-12-11 DIAGNOSIS — I472 Ventricular tachycardia, unspecified: Secondary | ICD-10-CM

## 2017-12-11 DIAGNOSIS — R001 Bradycardia, unspecified: Secondary | ICD-10-CM

## 2017-12-11 DIAGNOSIS — I255 Ischemic cardiomyopathy: Secondary | ICD-10-CM | POA: Diagnosis not present

## 2017-12-11 MED ORDER — AMIODARONE HCL 200 MG PO TABS: 100.0000 mg | ORAL_TABLET | Freq: Every day | ORAL | 3 refills | Status: DC

## 2017-12-11 NOTE — Progress Notes (Signed)
PCP: Dr Melford Aase Primary Cardiologist:  Dr Wynonia Lawman Primary EP: Dr Rayann Heman  Paul [name] is a 67 y.o. male who presents today for routine electrophysiology followup.  Since last being seen in our clinic, the patient reports doing very well. No VT in 2 years.  Today, he denies symptoms of palpitations, chest pain, shortness of breath,  lower extremity edema, dizziness, presyncope, syncope, or ICD shocks.  The patient is otherwise without complaint today.   Past Medical History:  Diagnosis Date  . BPH (benign prostatic hyperplasia)   . CAD (coronary artery disease)    widely patent LAD LIMA graft, widely patent PDA PLR SVG, occluded Diag 1 OM 1 SVG by cardiac CT 2007   . Depression   . GERD (gastroesophageal reflux disease)   . Hyperlipidemia   . IBS (irritable bowel syndrome)   . Ischemic cardiomyopathy    Prior inferior infarct EF 35% by ECHO 07/13/11    . S/P CABG (coronary artery bypass graft)    CABG 06/1998 [place], [place] with LIMA to LAD, SVG to OM, SVG to PD-PL.   OM graft occluded 10/12/2005 by CTA   . Sleep apnea   . Ventricular tachycardia ([place]) 10/15   VT at 188 bpm- unstable requiring external defibrillation   Past Surgical History:  Procedure Laterality Date  . APPENDECTOMY    . CORONARY ARTERY BYPASS GRAFT  1999  . IMPLANTABLE CARDIOVERTER DEFIBRILLATOR IMPLANT N/A 07/31/2014   MDT Gwyneth Revels XT DR ICD implanted by Dr Rayann Heman for secondary treatent of VT  . LEFT HEART CATHETERIZATION WITH CORONARY/GRAFT ANGIOGRAM N/A 07/30/2014   Procedure: LEFT HEART CATHETERIZATION WITH Beatrix Fetters;  Surgeon: Jacolyn Reedy, MD;  Location: [facility] CATH LAB;  Service: Cardiovascular;  Laterality: N/A;    ROS- all systems are reviewed and negative except as per HPI above  Current Outpatient Medications  Medication Sig Dispense Refill  . amiodarone (PACERONE) 200 MG tablet Take 1 tablet (200 mg total) by mouth daily. 90 tablet 3  . Ascorbic Acid (VITAMIN C) 1000 MG tablet Take 1,000 mg  by mouth daily.    Marland Kitchen aspirin EC 81 MG tablet Take 81 mg by mouth daily.    Marland Kitchen atorvastatin (LIPITOR) 80 MG tablet Take 80 mg by mouth daily.    Marland Kitchen azelastine (ASTELIN) 0.1 % nasal spray Place 1 spray into both nostrils 2 (two) times daily. Use in each nostril as directed 30 mL 12  . cetirizine (ZYRTEC) 10 MG tablet Take 10 mg by mouth daily.    . clopidogrel (PLAVIX) 75 MG tablet Take 75 mg by mouth daily.    . Coenzyme Q10 50 MG CAPS Take 1 capsule by mouth daily.    Marland Kitchen eplerenone (INSPRA) 25 MG tablet Take 25 mg by mouth daily.    . fenofibrate 160 MG tablet Take 160 mg by mouth daily.    . fluticasone (FLONASE) 50 MCG/ACT nasal spray Place 2 sprays into both nostrils daily. 16 g 6  . folic acid (FOLVITE) 154 MCG tablet Take 800 mcg by mouth daily.     . furosemide (LASIX) 20 MG tablet Take 1 tablet (20 mg total) by mouth daily as needed (WEIGHT GAIN). 90 tablet 3  . metoprolol succinate (TOPROL-XL) 100 MG 24 hr tablet TAKE 100 MG BY MOUTH IN THE AM & 50 MG BY MOUTH IN THE PM (Patient taking differently: Take 100 mg by mouth 2 (two) times daily. ) 135 tablet 2  . Multiple Vitamin (MULTIVITAMIN WITH MINERALS) TABS tablet  Take 1 tablet by mouth daily.    . nitroGLYCERIN (NITROSTAT) 0.4 MG SL tablet     . Omega-3 Fatty Acids (FISH OIL) 1000 MG CAPS Take 1 capsule by mouth daily.    Marland Kitchen omeprazole (PRILOSEC) 20 MG capsule TAKE 1 CAPSULE TWICE DAILY BEFORE A MEAL. 180 capsule 0  . ramipril (ALTACE) 10 MG capsule Take 10 mg by mouth daily.    . Vitamin E 400 UNITS TABS Take 400 Units by mouth daily.     Marland Kitchen zolpidem (AMBIEN) 5 MG tablet Take 1 tablet (5 mg total) by mouth at bedtime as needed for sleep. 30 tablet 3   No current facility-administered medications for this visit.     Physical Exam: Vitals:   12/11/17 1618  BP: 130/64  Pulse: (!) 53  Weight: 186 lb (84.4 kg)  Height: 5\' 8"  (1.727 m)    GEN- The patient is well appearing, alert and oriented x 3 today.   Head- normocephalic,  atraumatic Eyes-  Sclera clear, conjunctiva pink Ears- hearing intact Oropharynx- clear Lungs- Clear to ausculation bilaterally, normal work of breathing Chest- ICD pocket is well healed Heart- Regular rate and rhythm, no murmurs, rubs or gallops, PMI not laterally displaced GI- soft, NT, ND, + BS Extremities- no clubbing, cyanosis, or edema  ICD interrogation- reviewed in detail today,  See PACEART report  ekg tracing ordered today is personally reviewed and shows atrial paced rhythm 53 bpm, PR 258 msec, Qtc 480 msec, nonspecific St/T changes  Assessment and Plan:  1.  Chronic systolic dysfunction/ ischemic CM/ VT euvolemic today No ischemic symptoms No recent ICD shocks Stable on an appropriate medical regimen Normal ICD function See [facility] report No changes today As he has had no VT in 2 years, we discussed reduction of amiodarone today.  He would like to reduce this medicine.  I will therefore reduce amiodarone to 100mg  daily Dr Melford Aase is following lfts, tfts Followed in ICM device clinic  2. OSA Compliance with dental device encouraged  Follow-up with Dr Wynonia Lawman as scheduled [facility] Return to see EP NP in a year  Thompson Grayer MD, [facility] 12/11/2017 4:39 PM

## 2017-12-11 NOTE — Patient Instructions (Addendum)
Medication Instructions:  Your physician has recommended you make the following change in your medication:  1.  Reduce your amiodarone to 100 mg daily.  You will take a 1/2 tablet of your 200 mg amiodarone by mouth daily.  Labwork: None ordered.  Testing/Procedures: None ordered.  Follow-Up: Your physician wants you to follow-up in: one year with [name], NP.   You will receive a reminder letter in the mail two months in advance. If you don't receive a letter, please call our office to schedule the follow-up appointment.  Remote monitoring is used to monitor your ICD from home. This monitoring reduces the number of office visits required to check your device to one time per year. It allows [place] to keep an eye on the functioning of your device to ensure it is working properly. You are scheduled for a device check from home on 01/11/2018. You may send your transmission at any time that day. If you have a wireless device, the transmission will be sent automatically. After your physician reviews your transmission, you will receive a postcard with your next transmission date.  Any Other Special Instructions Will Be Listed Below (If Applicable).  If you need a refill on your cardiac medications before your next appointment, please call your pharmacy.

## 2017-12-12 LAB — CUP PACEART INCLINIC DEVICE CHECK
Battery Remaining Longevity: 81 mo
Battery Voltage: 2.98 V
Brady Statistic AP VP Percent: 0.49 %
Brady Statistic AS VS Percent: 39.04 %
Brady Statistic RA Percent Paced: 58.82 %
Brady Statistic RV Percent Paced: 0.69 %
Date Time Interrogation Session: 20190218225039
HIGH POWER IMPEDANCE MEASURED VALUE: 72 Ohm
Implantable Lead Implant Date: 20151008
Implantable Lead Location: 753860
Implantable Lead Model: 5076
Implantable Pulse Generator Implant Date: 20151008
Lead Channel Impedance Value: 285 Ohm
Lead Channel Impedance Value: 361 Ohm
Lead Channel Impedance Value: 475 Ohm
Lead Channel Pacing Threshold Amplitude: 0.75 V
Lead Channel Pacing Threshold Pulse Width: 0.4 ms
Lead Channel Sensing Intrinsic Amplitude: 1.875 mV
Lead Channel Setting Pacing Pulse Width: 0.4 ms
MDC IDC LEAD IMPLANT DT: 20151008
MDC IDC LEAD LOCATION: 753859
MDC IDC MSMT LEADCHNL RA PACING THRESHOLD PULSEWIDTH: 0.4 ms
MDC IDC MSMT LEADCHNL RV PACING THRESHOLD AMPLITUDE: 1 V
MDC IDC MSMT LEADCHNL RV SENSING INTR AMPL: 8 mV
MDC IDC SET LEADCHNL RA PACING AMPLITUDE: 2 V
MDC IDC SET LEADCHNL RV PACING AMPLITUDE: 2.5 V
MDC IDC SET LEADCHNL RV SENSING SENSITIVITY: 0.3 mV
MDC IDC STAT BRADY AP VS PERCENT: 60.31 %
MDC IDC STAT BRADY AS VP PERCENT: 0.16 %

## 2017-12-19 ENCOUNTER — Other Ambulatory Visit: Payer: Self-pay | Admitting: Nurse Practitioner

## 2017-12-26 DIAGNOSIS — E538 Deficiency of other specified B group vitamins: Secondary | ICD-10-CM | POA: Diagnosis not present

## 2018-01-11 ENCOUNTER — Telehealth: Payer: Self-pay

## 2018-01-11 ENCOUNTER — Ambulatory Visit (INDEPENDENT_AMBULATORY_CARE_PROVIDER_SITE_OTHER): Payer: PPO | Admitting: *Deleted

## 2018-01-11 DIAGNOSIS — I472 Ventricular tachycardia, unspecified: Secondary | ICD-10-CM

## 2018-01-11 DIAGNOSIS — Z95810 Presence of automatic (implantable) cardiac defibrillator: Secondary | ICD-10-CM

## 2018-01-11 DIAGNOSIS — I5022 Chronic systolic (congestive) heart failure: Secondary | ICD-10-CM | POA: Diagnosis not present

## 2018-01-11 NOTE — Progress Notes (Signed)
EPIC Encounter for ICM Monitoring  Patient Name: Paul [name] is a 67 y.o. male Date: 01/11/2018 Primary Care Physican: Patient, No Pcp Per Primary Cardiologist:Tilley Electrophysiologist: Allred Dry Weight:Previous weight 183lbs       Attempted call to patient and unable to reach.  Left detailed message regarding transmission.  Transmission reviewed.    Thoracic impedance normal.  Prescribed dosage: Furosemide 20 mg 1 tablet (20 mg total) by mouth daily as needed (WEIGHT GAIN).  Labs: 05/10/2017 Creatinine 1.15, BUN 11, Potassium 4.3, Sodium 140, EGFR 67.56 04/18/2017 Creatinine 1.21, BUN 11, Potassium 4.6, Sodium 136, EGFR 63.72 11/11/2016 Creatinine 1.35, BUN 15, Potassium 4.7, Sodium 136, EGFR 56.23   Recommendations: Left voice mail with ICM number and encouraged to call if experiencing any fluid symptoms.  Follow-up plan: ICM clinic phone appointment on 02/15/2018.    Copy of ICM check sent to Dr. Rayann Heman.   3 month ICM trend: 01/11/2018    1 Year ICM trend:       [name], RN 01/11/2018 3:08 PM

## 2018-01-11 NOTE — Progress Notes (Signed)
Remote ICD transmission.   

## 2018-01-11 NOTE — Telephone Encounter (Signed)
Remote ICM transmission received.  Attempted call to patient and left detailed message per DPR regarding transmission and next ICM scheduled for 02/15/2018.  Advised to return call for any fluid symptoms or questions.    

## 2018-01-12 ENCOUNTER — Encounter: Payer: Self-pay | Admitting: Cardiology

## 2018-01-15 DIAGNOSIS — Z125 Encounter for screening for malignant neoplasm of prostate: Secondary | ICD-10-CM | POA: Diagnosis not present

## 2018-01-15 DIAGNOSIS — I10 Essential (primary) hypertension: Secondary | ICD-10-CM | POA: Diagnosis not present

## 2018-01-15 DIAGNOSIS — E538 Deficiency of other specified B group vitamins: Secondary | ICD-10-CM | POA: Diagnosis not present

## 2018-01-15 DIAGNOSIS — E785 Hyperlipidemia, unspecified: Secondary | ICD-10-CM | POA: Diagnosis not present

## 2018-01-17 DIAGNOSIS — I10 Essential (primary) hypertension: Secondary | ICD-10-CM | POA: Diagnosis not present

## 2018-01-17 DIAGNOSIS — E785 Hyperlipidemia, unspecified: Secondary | ICD-10-CM | POA: Diagnosis not present

## 2018-01-17 DIAGNOSIS — G4700 Insomnia, unspecified: Secondary | ICD-10-CM | POA: Diagnosis not present

## 2018-01-17 DIAGNOSIS — I5042 Chronic combined systolic (congestive) and diastolic (congestive) heart failure: Secondary | ICD-10-CM | POA: Diagnosis not present

## 2018-01-23 ENCOUNTER — Telehealth: Payer: Self-pay

## 2018-01-23 DIAGNOSIS — E538 Deficiency of other specified B group vitamins: Secondary | ICD-10-CM | POA: Diagnosis not present

## 2018-01-23 NOTE — Telephone Encounter (Signed)
Returned patient call as requested by voice mail message regarding standard letter.  Spoke with wife due to patient was not home.  Explained device clinic letter which will not affect ICM monthly scheduling.  Advised no change in current remote scheduling.

## 2018-02-02 LAB — CUP PACEART REMOTE DEVICE CHECK
Battery Voltage: 2.98 V
Brady Statistic AP VP Percent: 0.19 %
Brady Statistic AS VP Percent: 0.11 %
Brady Statistic RA Percent Paced: 38.05 %
Brady Statistic RV Percent Paced: 0.38 %
Date Time Interrogation Session: 20190321132931
HighPow Impedance: 66 Ohm
Implantable Lead Implant Date: 20151008
Implantable Lead Location: 753860
Implantable Lead Model: 5076
Implantable Pulse Generator Implant Date: 20151008
Lead Channel Impedance Value: 285 Ohm
Lead Channel Impedance Value: 304 Ohm
Lead Channel Pacing Threshold Pulse Width: 0.4 ms
Lead Channel Sensing Intrinsic Amplitude: 1.625 mV
Lead Channel Sensing Intrinsic Amplitude: 8.375 mV
Lead Channel Sensing Intrinsic Amplitude: 8.375 mV
Lead Channel Setting Pacing Amplitude: 2 V
Lead Channel Setting Pacing Pulse Width: 0.4 ms
Lead Channel Setting Sensing Sensitivity: 0.3 mV
MDC IDC LEAD IMPLANT DT: 20151008
MDC IDC LEAD LOCATION: 753859
MDC IDC MSMT BATTERY REMAINING LONGEVITY: 79 mo
MDC IDC MSMT LEADCHNL RA IMPEDANCE VALUE: 418 Ohm
MDC IDC MSMT LEADCHNL RA PACING THRESHOLD AMPLITUDE: 0.875 V
MDC IDC MSMT LEADCHNL RA SENSING INTR AMPL: 1.625 mV
MDC IDC MSMT LEADCHNL RV PACING THRESHOLD AMPLITUDE: 1 V
MDC IDC MSMT LEADCHNL RV PACING THRESHOLD PULSEWIDTH: 0.4 ms
MDC IDC SET LEADCHNL RV PACING AMPLITUDE: 2.5 V
MDC IDC STAT BRADY AP VS PERCENT: 39.72 %
MDC IDC STAT BRADY AS VS PERCENT: 59.98 %

## 2018-02-15 ENCOUNTER — Ambulatory Visit (INDEPENDENT_AMBULATORY_CARE_PROVIDER_SITE_OTHER): Payer: PPO

## 2018-02-15 DIAGNOSIS — Z95810 Presence of automatic (implantable) cardiac defibrillator: Secondary | ICD-10-CM | POA: Diagnosis not present

## 2018-02-15 DIAGNOSIS — I5022 Chronic systolic (congestive) heart failure: Secondary | ICD-10-CM

## 2018-02-15 DIAGNOSIS — E538 Deficiency of other specified B group vitamins: Secondary | ICD-10-CM | POA: Diagnosis not present

## 2018-02-16 NOTE — Progress Notes (Signed)
EPIC Encounter for ICM Monitoring  Patient Name: Dareion Kneece is a 67 y.o. male Date: 02/16/2018 Primary Care Physican: Patient, No Pcp Per Primary Cardiologist:Tilley Electrophysiologist: Allred Dry Weight:184lbs        Heart Failure questions reviewed, pt asymptomatic.  Patient had birthday celebration around [birth year] during slight decrease in impedance.  Taking antibiotics for sinus infection.    Thoracic impedance normal.  Prescribed dosage: Furosemide 20 mg 1 tablet (20 mg total) by mouth daily as needed (WEIGHT GAIN).  Labs: 05/10/2017 Creatinine 1.15, BUN 11, Potassium 4.3, Sodium 140, EGFR 67.56 04/18/2017 Creatinine 1.21, BUN 11, Potassium 4.6, Sodium 136, EGFR 63.72 11/11/2016 Creatinine 1.35, BUN 15, Potassium 4.7, Sodium 136, EGFR 56.23  Recommendations: No changes.  Encouraged to call for fluid symptoms.  Follow-up plan: ICM clinic phone appointment on 03/20/2018.    Copy of ICM check sent to Dr. Rayann Heman.   3 month ICM trend: 02/15/2018    1 Year ICM trend:       [name], RN 02/16/2018 11:30 AM

## 2018-03-20 ENCOUNTER — Ambulatory Visit (INDEPENDENT_AMBULATORY_CARE_PROVIDER_SITE_OTHER): Payer: PPO

## 2018-03-20 DIAGNOSIS — Z95810 Presence of automatic (implantable) cardiac defibrillator: Secondary | ICD-10-CM | POA: Diagnosis not present

## 2018-03-20 DIAGNOSIS — I5022 Chronic systolic (congestive) heart failure: Secondary | ICD-10-CM | POA: Diagnosis not present

## 2018-03-20 NOTE — Progress Notes (Signed)
EPIC Encounter for ICM Monitoring  Patient Name: Paul [name] is a 67 y.o. male Date: 03/20/2018 Primary Care Physican: Patient, No Pcp Per Primary Cardiologist:Tilley Electrophysiologist: Allred Dry Weight:184lbs       Heart Failure questions reviewed, pt asymptomatic.  The home number, [phone],  will be disconnected and new address is [address], [place] [address].  Will use cell phone for contacting.    Thoracic impedance  normal.  Prescribed dosage: Furosemide 20 mg 1 tablet (20 mg total) by mouth daily as needed (WEIGHT GAIN).  Labs: 05/10/2017 Creatinine 1.15, BUN 11, Potassium 4.3, Sodium 140, EGFR 67.56 04/18/2017 Creatinine 1.21, BUN 11, Potassium 4.6, Sodium 136, EGFR 63.72 11/11/2016 Creatinine 1.35, BUN 15, Potassium 4.7, Sodium 136, EGFR 56.23  Recommendations: No changes.   Encouraged to call for fluid symptoms.  Follow-up plan: ICM clinic phone appointment on 04/23/2018.    Copy of ICM check sent to Dr. Rayann Heman.   3 month ICM trend: 03/20/2018    1 Year ICM trend:       [name], RN 03/20/2018 12:21 PM

## 2018-04-23 ENCOUNTER — Ambulatory Visit (INDEPENDENT_AMBULATORY_CARE_PROVIDER_SITE_OTHER): Payer: PPO

## 2018-04-23 ENCOUNTER — Ambulatory Visit (INDEPENDENT_AMBULATORY_CARE_PROVIDER_SITE_OTHER): Payer: PPO | Admitting: *Deleted

## 2018-04-23 DIAGNOSIS — I472 Ventricular tachycardia, unspecified: Secondary | ICD-10-CM

## 2018-04-23 DIAGNOSIS — I5022 Chronic systolic (congestive) heart failure: Secondary | ICD-10-CM

## 2018-04-23 DIAGNOSIS — Z95810 Presence of automatic (implantable) cardiac defibrillator: Secondary | ICD-10-CM | POA: Diagnosis not present

## 2018-04-23 NOTE — Progress Notes (Signed)
Remote ICD transmission.   

## 2018-04-23 NOTE — Progress Notes (Signed)
EPIC Encounter for ICM Monitoring  Patient Name: Paul [name] is a 67 y.o. male Date: 04/23/2018 Primary Care Physican: Patient, No Pcp Per Primary Cardiologist:Tilley Electrophysiologist: Allred Dry Weight:180lbs      Heart Failure questions reviewed, pt asymptomatic.  He said he is feels well.  They are in between houses and today was at [facility].   He has been eating out more than usual.    Thoracic impedance normal.  Prescribed dosage: Furosemide 20 mg 1 tablet (20 mg total) by mouth daily as needed (WEIGHT GAIN).  Labs: 05/10/2017 Creatinine 1.15, BUN 11, Potassium 4.3, Sodium 140, EGFR 67.56 04/18/2017 Creatinine 1.21, BUN 11, Potassium 4.6, Sodium 136, EGFR 63.72 11/11/2016 Creatinine 1.35, BUN 15, Potassium 4.7, Sodium 136, EGFR 56.23  Recommendations: No changes.   Encouraged to call for fluid symptoms.  Follow-up plan: ICM clinic phone appointment on 05/24/2018.     Copy of ICM check sent to Dr. Rayann Heman.   3 month ICM trend: 04/23/2018    1 Year ICM trend:       [name], RN 04/23/2018 11:34 AM

## 2018-05-03 LAB — CUP PACEART REMOTE DEVICE CHECK
Battery Remaining Longevity: 75 mo
Brady Statistic AP VP Percent: 0.1 %
Brady Statistic RA Percent Paced: 48.13 %
Date Time Interrogation Session: 20190701052403
HIGH POWER IMPEDANCE MEASURED VALUE: 75 Ohm
Implantable Lead Implant Date: 20151008
Implantable Lead Location: 753859
Implantable Lead Location: 753860
Implantable Lead Model: 5076
Implantable Pulse Generator Implant Date: 20151008
Lead Channel Impedance Value: 342 Ohm
Lead Channel Pacing Threshold Amplitude: 0.875 V
Lead Channel Pacing Threshold Amplitude: 1 V
Lead Channel Sensing Intrinsic Amplitude: 1.5 mV
Lead Channel Setting Pacing Amplitude: 2.5 V
MDC IDC LEAD IMPLANT DT: 20151008
MDC IDC MSMT BATTERY VOLTAGE: 2.98 V
MDC IDC MSMT LEADCHNL RA IMPEDANCE VALUE: 475 Ohm
MDC IDC MSMT LEADCHNL RA PACING THRESHOLD PULSEWIDTH: 0.4 ms
MDC IDC MSMT LEADCHNL RA SENSING INTR AMPL: 1.5 mV
MDC IDC MSMT LEADCHNL RV IMPEDANCE VALUE: 285 Ohm
MDC IDC MSMT LEADCHNL RV PACING THRESHOLD PULSEWIDTH: 0.4 ms
MDC IDC MSMT LEADCHNL RV SENSING INTR AMPL: 6.75 mV
MDC IDC MSMT LEADCHNL RV SENSING INTR AMPL: 6.75 mV
MDC IDC SET LEADCHNL RA PACING AMPLITUDE: 2 V
MDC IDC SET LEADCHNL RV PACING PULSEWIDTH: 0.4 ms
MDC IDC SET LEADCHNL RV SENSING SENSITIVITY: 0.3 mV
MDC IDC STAT BRADY AP VS PERCENT: 51.73 %
MDC IDC STAT BRADY AS VP PERCENT: 0.06 %
MDC IDC STAT BRADY AS VS PERCENT: 48.11 %
MDC IDC STAT BRADY RV PERCENT PACED: 0.17 %

## 2018-05-24 ENCOUNTER — Ambulatory Visit (INDEPENDENT_AMBULATORY_CARE_PROVIDER_SITE_OTHER): Payer: PPO

## 2018-05-24 DIAGNOSIS — Z95810 Presence of automatic (implantable) cardiac defibrillator: Secondary | ICD-10-CM | POA: Diagnosis not present

## 2018-05-24 DIAGNOSIS — I5022 Chronic systolic (congestive) heart failure: Secondary | ICD-10-CM | POA: Diagnosis not present

## 2018-05-24 NOTE — Progress Notes (Signed)
EPIC Encounter for ICM Monitoring  Patient Name: Paul [name] is a 67 y.o. male Date: 05/24/2018 Primary Care Physican: Patient, No Pcp Per Primary Cardiologist:Tilley Electrophysiologist: Allred Dry Weight:182lbs      Heart Failure questions reviewed, pt asymptomatic.   Thoracic impedance abnormal suggesting fluid accumulation starting 05/17/2018.  Prescribed dosage: Furosemide 20 mg 1 tablet (20 mg total) by mouth daily as needed (WEIGHT GAIN).  Labs: 05/10/2017 Creatinine 1.15, BUN 11, Potassium 4.3, Sodium 140, EGFR 67.56 04/18/2017 Creatinine 1.21, BUN 11, Potassium 4.6, Sodium 136, EGFR 63.72 11/11/2016 Creatinine 1.35, BUN 15, Potassium 4.7, Sodium 136, EGFR 56.23  Recommendations: Advised to take PRN Furosemide 1 tablet x 2 days and then return to PRN.  Advised to limit salt intake.   Follow-up plan: ICM clinic phone appointment on 05/31/2018 to recheck fluid levels.     Copy of ICM check sent to Dr. Rayann Heman.   3 month ICM trend: 05/24/2018    1 Year ICM trend:       [name], RN 05/24/2018 1:44 PM

## 2018-05-31 ENCOUNTER — Ambulatory Visit (INDEPENDENT_AMBULATORY_CARE_PROVIDER_SITE_OTHER): Payer: PPO

## 2018-05-31 DIAGNOSIS — Z95810 Presence of automatic (implantable) cardiac defibrillator: Secondary | ICD-10-CM

## 2018-05-31 DIAGNOSIS — I5022 Chronic systolic (congestive) heart failure: Secondary | ICD-10-CM

## 2018-06-01 NOTE — Progress Notes (Signed)
EPIC Encounter for ICM Monitoring  Patient Name: Paul [name] is a 67 y.o. male Date: 06/01/2018 Primary Care Physican: Patient, No Pcp Per Primary Cardiologist:Tilley Electrophysiologist: Allred Dry Weight:179lbs                                                  Heart Failure questions reviewed, pt asymptomatic.  He lost several pounds after taking PRN Furosemide.    Thoracic impedance returned to normal after taking 2 days of PRN Furosemide.  Prescribed dosage: Furosemide 20 mg 1 tablet (20 mg total) by mouth daily as needed (WEIGHT GAIN).  Labs: 05/10/2017 Creatinine 1.15, BUN 11, Potassium 4.3, Sodium 140, EGFR 67.56 04/18/2017 Creatinine 1.21, BUN 11, Potassium 4.6, Sodium 136, EGFR 63.72 11/11/2016 Creatinine 1.35, BUN 15, Potassium 4.7, Sodium 136, EGFR 56.23  Recommendations: No changes.   Encouraged to call for fluid symptoms.  Follow-up plan: ICM clinic phone appointment on 07/02/2018.       Copy of ICM check sent to Dr. Rayann Heman.   3 month ICM trend: 05/31/2018    1 Year ICM trend:       [name], RN 06/01/2018 5:51 PM

## 2018-06-22 DIAGNOSIS — R911 Solitary pulmonary nodule: Secondary | ICD-10-CM | POA: Diagnosis not present

## 2018-06-22 DIAGNOSIS — I5042 Chronic combined systolic (congestive) and diastolic (congestive) heart failure: Secondary | ICD-10-CM | POA: Diagnosis not present

## 2018-06-22 DIAGNOSIS — I255 Ischemic cardiomyopathy: Secondary | ICD-10-CM | POA: Diagnosis not present

## 2018-06-22 DIAGNOSIS — E538 Deficiency of other specified B group vitamins: Secondary | ICD-10-CM | POA: Diagnosis not present

## 2018-06-22 DIAGNOSIS — I10 Essential (primary) hypertension: Secondary | ICD-10-CM | POA: Diagnosis not present

## 2018-06-22 DIAGNOSIS — E785 Hyperlipidemia, unspecified: Secondary | ICD-10-CM | POA: Diagnosis not present

## 2018-06-22 DIAGNOSIS — G4701 Insomnia due to medical condition: Secondary | ICD-10-CM | POA: Diagnosis not present

## 2018-06-22 DIAGNOSIS — Z125 Encounter for screening for malignant neoplasm of prostate: Secondary | ICD-10-CM | POA: Diagnosis not present

## 2018-06-22 DIAGNOSIS — G4733 Obstructive sleep apnea (adult) (pediatric): Secondary | ICD-10-CM | POA: Diagnosis not present

## 2018-06-22 DIAGNOSIS — K219 Gastro-esophageal reflux disease without esophagitis: Secondary | ICD-10-CM | POA: Diagnosis not present

## 2018-06-22 DIAGNOSIS — N400 Enlarged prostate without lower urinary tract symptoms: Secondary | ICD-10-CM | POA: Diagnosis not present

## 2018-06-22 DIAGNOSIS — J301 Allergic rhinitis due to pollen: Secondary | ICD-10-CM | POA: Diagnosis not present

## 2018-06-22 DIAGNOSIS — I2510 Atherosclerotic heart disease of native coronary artery without angina pectoris: Secondary | ICD-10-CM | POA: Diagnosis not present

## 2018-06-28 ENCOUNTER — Ambulatory Visit (INDEPENDENT_AMBULATORY_CARE_PROVIDER_SITE_OTHER): Payer: PPO

## 2018-06-28 DIAGNOSIS — I5022 Chronic systolic (congestive) heart failure: Secondary | ICD-10-CM | POA: Diagnosis not present

## 2018-06-28 DIAGNOSIS — Z95810 Presence of automatic (implantable) cardiac defibrillator: Secondary | ICD-10-CM | POA: Diagnosis not present

## 2018-06-29 NOTE — Progress Notes (Addendum)
EPIC Encounter for ICM Monitoring  Patient Name: Paul [name] is a 67 y.o. male Date: 06/29/2018 Primary Care Physican: Patient, No Pcp Per Primary Cardiologist:Tilley Electrophysiologist: Allred Dry Weight:Previous weight 179lbs      Attempted call to patient and unable to reach.  Left detailed message, per DPR, regarding transmission.  Transmission reviewed.    Thoracic impedance normal.  Prescribed dosage: Furosemide 20 mg 1 tablet (20 mg total) by mouth daily as needed (WEIGHT GAIN).  Labs: 05/10/2017 Creatinine 1.15, BUN 11, Potassium 4.3, Sodium 140, EGFR 67.56 04/18/2017 Creatinine 1.21, BUN 11, Potassium 4.6, Sodium 136, EGFR 63.72 11/11/2016 Creatinine 1.35, BUN 15, Potassium 4.7, Sodium 136, EGFR 56.23  Recommendations: Left voice mail with ICM number and encouraged to call if experiencing any fluid symptoms.  Follow-up plan: ICM clinic phone appointment on 07/31/2018.  Copy of ICM check sent to Dr. Allred.   3 month ICM trend: 06/28/2018    1 Year ICM trend:       Laurie S Short, RN 06/29/2018 5:06 PM   

## 2018-07-04 DIAGNOSIS — I2510 Atherosclerotic heart disease of native coronary artery without angina pectoris: Secondary | ICD-10-CM | POA: Diagnosis not present

## 2018-07-04 DIAGNOSIS — Z125 Encounter for screening for malignant neoplasm of prostate: Secondary | ICD-10-CM | POA: Diagnosis not present

## 2018-07-04 DIAGNOSIS — I10 Essential (primary) hypertension: Secondary | ICD-10-CM | POA: Diagnosis not present

## 2018-07-04 DIAGNOSIS — I5042 Chronic combined systolic (congestive) and diastolic (congestive) heart failure: Secondary | ICD-10-CM | POA: Diagnosis not present

## 2018-07-04 DIAGNOSIS — E785 Hyperlipidemia, unspecified: Secondary | ICD-10-CM | POA: Diagnosis not present

## 2018-07-04 DIAGNOSIS — E538 Deficiency of other specified B group vitamins: Secondary | ICD-10-CM | POA: Diagnosis not present

## 2018-07-10 DIAGNOSIS — W57XXXA Bitten or stung by nonvenomous insect and other nonvenomous arthropods, initial encounter: Secondary | ICD-10-CM | POA: Diagnosis not present

## 2018-07-10 DIAGNOSIS — S80862A Insect bite (nonvenomous), left lower leg, initial encounter: Secondary | ICD-10-CM | POA: Diagnosis not present

## 2018-07-31 ENCOUNTER — Ambulatory Visit (INDEPENDENT_AMBULATORY_CARE_PROVIDER_SITE_OTHER): Payer: PPO

## 2018-07-31 ENCOUNTER — Ambulatory Visit (INDEPENDENT_AMBULATORY_CARE_PROVIDER_SITE_OTHER): Payer: PPO | Admitting: *Deleted

## 2018-07-31 DIAGNOSIS — Z95810 Presence of automatic (implantable) cardiac defibrillator: Secondary | ICD-10-CM

## 2018-07-31 DIAGNOSIS — I5022 Chronic systolic (congestive) heart failure: Secondary | ICD-10-CM | POA: Diagnosis not present

## 2018-07-31 DIAGNOSIS — I255 Ischemic cardiomyopathy: Secondary | ICD-10-CM | POA: Diagnosis not present

## 2018-07-31 NOTE — Progress Notes (Signed)
EPIC Encounter for ICM Monitoring  Patient Name: Jen Eppinger is a 67 y.o. male Date: 07/31/2018 Primary Care Physican: Patient, No Pcp Per Primary Cardiologist:Tilley Electrophysiologist: Allred Dry Weight:179-180lbs      Heart Failure questions reviewed, pt asymptomatic.   Thoracic impedance normal.  Prescribed: Furosemide 20 mg 1 tablet (20 mg total) by mouth daily as needed (WEIGHT GAIN).  Labs: 05/10/2017 Creatinine 1.15, BUN 11, Potassium 4.3, Sodium 140, EGFR 67.56 04/18/2017 Creatinine 1.21, BUN 11, Potassium 4.6, Sodium 136, EGFR 63.72 11/11/2016 Creatinine 1.35, BUN 15, Potassium 4.7, Sodium 136, EGFR 56.23  Recommendations: No changes.    Encouraged to call for fluid symptoms.  Follow-up plan: ICM clinic phone appointment on 08/31/2018.       Copy of ICM check sent to Dr. Rayann Heman.   3 month ICM trend: 07/31/2018   1 Year ICM trend:       [name], RN 07/31/2018 2:51 PM

## 2018-08-01 NOTE — Progress Notes (Signed)
Remote ICD transmission.   

## 2018-08-09 ENCOUNTER — Encounter: Payer: Self-pay | Admitting: Cardiology

## 2018-08-17 DIAGNOSIS — J0141 Acute recurrent pansinusitis: Secondary | ICD-10-CM | POA: Diagnosis not present

## 2018-08-23 LAB — CUP PACEART REMOTE DEVICE CHECK
Brady Statistic AP VS Percent: 41.63 %
Brady Statistic AS VP Percent: 0.08 %
Brady Statistic RA Percent Paced: 39.35 %
Brady Statistic RV Percent Paced: 0.22 %
HIGH POWER IMPEDANCE MEASURED VALUE: 82 Ohm
Implantable Lead Implant Date: 20151008
Implantable Lead Location: 753859
Implantable Lead Model: 5076
Lead Channel Impedance Value: 285 Ohm
Lead Channel Impedance Value: 361 Ohm
Lead Channel Impedance Value: 456 Ohm
Lead Channel Pacing Threshold Amplitude: 1 V
Lead Channel Sensing Intrinsic Amplitude: 7 mV
Lead Channel Setting Pacing Amplitude: 2 V
Lead Channel Setting Pacing Amplitude: 2.5 V
Lead Channel Setting Pacing Pulse Width: 0.4 ms
Lead Channel Setting Sensing Sensitivity: 0.3 mV
MDC IDC LEAD IMPLANT DT: 20151008
MDC IDC LEAD LOCATION: 753860
MDC IDC MSMT BATTERY REMAINING LONGEVITY: 70 mo
MDC IDC MSMT BATTERY VOLTAGE: 2.98 V
MDC IDC MSMT LEADCHNL RA PACING THRESHOLD AMPLITUDE: 0.875 V
MDC IDC MSMT LEADCHNL RA PACING THRESHOLD PULSEWIDTH: 0.4 ms
MDC IDC MSMT LEADCHNL RA SENSING INTR AMPL: 1.875 mV
MDC IDC MSMT LEADCHNL RA SENSING INTR AMPL: 1.875 mV
MDC IDC MSMT LEADCHNL RV PACING THRESHOLD PULSEWIDTH: 0.4 ms
MDC IDC MSMT LEADCHNL RV SENSING INTR AMPL: 7 mV
MDC IDC PG IMPLANT DT: 20151008
MDC IDC SESS DTM: 20191008052205
MDC IDC STAT BRADY AP VP PERCENT: 0.1 %
MDC IDC STAT BRADY AS VS PERCENT: 58.18 %

## 2018-08-25 DIAGNOSIS — J209 Acute bronchitis, unspecified: Secondary | ICD-10-CM | POA: Diagnosis not present

## 2018-08-28 DIAGNOSIS — I2583 Coronary atherosclerosis due to lipid rich plaque: Secondary | ICD-10-CM | POA: Diagnosis not present

## 2018-08-28 DIAGNOSIS — I10 Essential (primary) hypertension: Secondary | ICD-10-CM | POA: Diagnosis not present

## 2018-08-28 DIAGNOSIS — Z95810 Presence of automatic (implantable) cardiac defibrillator: Secondary | ICD-10-CM | POA: Diagnosis not present

## 2018-08-28 DIAGNOSIS — E7800 Pure hypercholesterolemia, unspecified: Secondary | ICD-10-CM | POA: Diagnosis not present

## 2018-08-28 DIAGNOSIS — I255 Ischemic cardiomyopathy: Secondary | ICD-10-CM | POA: Diagnosis not present

## 2018-08-28 DIAGNOSIS — I5022 Chronic systolic (congestive) heart failure: Secondary | ICD-10-CM | POA: Diagnosis not present

## 2018-08-28 DIAGNOSIS — I2510 Atherosclerotic heart disease of native coronary artery without angina pectoris: Secondary | ICD-10-CM | POA: Diagnosis not present

## 2018-08-31 ENCOUNTER — Ambulatory Visit (INDEPENDENT_AMBULATORY_CARE_PROVIDER_SITE_OTHER): Payer: PPO

## 2018-08-31 DIAGNOSIS — I5022 Chronic systolic (congestive) heart failure: Secondary | ICD-10-CM

## 2018-08-31 DIAGNOSIS — Z95810 Presence of automatic (implantable) cardiac defibrillator: Secondary | ICD-10-CM

## 2018-09-03 NOTE — Progress Notes (Signed)
EPIC Encounter for ICM Monitoring  Patient Name: Paul [name] is a 67 y.o. male Date: 09/03/2018 Primary Care Physican: Patient, No Pcp Per Primary Cardiologist:Tilley Electrophysiologist: Allred Last Weight: 179-180lbs       Heart Failure questions reviewed, pt asymptomatic.  He is being treated bronchitis for past 2 weeks.   Thoracic impedance normal.   Prescribed: Furosemide 20 mg 1 tablet (20 mg total) by mouth daily as needed (WEIGHT GAIN).  Labs: 05/10/2017 Creatinine 1.15, BUN 11, Potassium 4.3, Sodium 140, EGFR 67.56 04/18/2017 Creatinine 1.21, BUN 11, Potassium 4.6, Sodium 136, EGFR 63.72 11/11/2016 Creatinine 1.35, BUN 15, Potassium 4.7, Sodium 136, EGFR 56.23  Recommendations: No changes.  Encouraged to call for fluid symptoms.  Follow-up plan: ICM clinic phone appointment on 10/04/2018.       Copy of ICM check sent to Dr. Rayann Heman.   3 month ICM trend: 09/03/2018    1 Year ICM trend:       [name], RN 09/03/2018 11:17 AM

## 2018-09-07 DIAGNOSIS — E538 Deficiency of other specified B group vitamins: Secondary | ICD-10-CM | POA: Diagnosis not present

## 2018-09-07 DIAGNOSIS — D510 Vitamin B12 deficiency anemia due to intrinsic factor deficiency: Secondary | ICD-10-CM | POA: Diagnosis not present

## 2018-10-02 DIAGNOSIS — D510 Vitamin B12 deficiency anemia due to intrinsic factor deficiency: Secondary | ICD-10-CM | POA: Diagnosis not present

## 2018-10-04 ENCOUNTER — Telehealth: Payer: Self-pay | Admitting: *Deleted

## 2018-10-04 ENCOUNTER — Telehealth: Payer: Self-pay

## 2018-10-04 ENCOUNTER — Ambulatory Visit (INDEPENDENT_AMBULATORY_CARE_PROVIDER_SITE_OTHER): Payer: PPO

## 2018-10-04 DIAGNOSIS — Z95810 Presence of automatic (implantable) cardiac defibrillator: Secondary | ICD-10-CM

## 2018-10-04 DIAGNOSIS — I5022 Chronic systolic (congestive) heart failure: Secondary | ICD-10-CM | POA: Diagnosis not present

## 2018-10-04 NOTE — Telephone Encounter (Signed)
-----   Message from [name], RN sent at 10/04/2018  2:14 PM EST ----- Regarding: PVCs This patient has felt lightheaded and dizziness a few mornings in the last few weeks (exact dates unknown) and said his pulse was 42. He confirmed he has not missed any medications dosages and feels fine now.  Report has PVC singles at 454.1 hr and PVC runs 1.7 hour.  Can you review to see if this needs to be addressed with Dr Rayann Heman?    Thanks, Margarita Grizzle

## 2018-10-04 NOTE — Telephone Encounter (Signed)
Remote ICM transmission received.  Attempted call to patient regarding ICM remote transmission and left detailed message, per DPR, to return ca.

## 2018-10-04 NOTE — Progress Notes (Signed)
EPIC Encounter for ICM Monitoring  Patient Name: Paul [name] is a 67 y.o. male Date: 10/04/2018 Primary Care Physican: Patient, No Pcp Per Primary Cardiologist:Tilley Electrophysiologist: Allred Last Weight: 179-180lbs Today's Weight:  178-179   Since Last Session 31-Aug-2018 to 04-Oct-2018 34 days PVC Runs (2-4 beats)   1.7 per hour PVC Singles                  454.1 per hour       Heart failure questions reviewed.  Patient denied any fluid symptoms but did have some dizziness and lightheadedness a few mornings in the last month but does not remember exact dates.  He said his pulse was 42.  Advised will let device clinic nurse review report and will let him know if there are any recommendations     Thoracic impedance abnormal suggesting fluid accumulation starting 09/19/2018.   Prescribed: Furosemide 20 mg 1 tablet (20 mg total) by mouth daily as needed (WEIGHT GAIN).   Confirmed he has taken all of his medications without missing dosages.   Labs: 07/04/2018 Creatinine 1.21, BUN 13, Potassium 4.2, Sodium 142, eGFR 62-71  Care Everywhere 01/15/2018 Creatinine 1.22, BUN 15, Potassium 4.6, Sodium 142, eGFR 61-71  Care Everywhere  Recommendations:  Advised to take Furosemide 20 mg 1 tablet daily x 3 days and then return to PRN.    Follow-up plan: ICM clinic phone appointment on 10/08/2018 to recheck fluid levels.       Copy of ICM check sent to Dr. Rayann Heman.   3 month ICM trend: 10/04/2018    1 Year ICM trend:       [name], RN 10/04/2018 1:52 PM

## 2018-10-04 NOTE — Telephone Encounter (Signed)
Remote transmission reviewed. Presenting rhythm: ApVs, w/PVCs. No atrial or ventricular arrhythmias recorded. Stable lead measurements. Normal device function.   Last PVC burden (btwn 07/31/18-08/31/18): 0.7 PVC runs per hr, 97.8 PVC singles per hr.

## 2018-10-05 NOTE — Progress Notes (Signed)
Spoke with patient.  He is feeling fine today.  He does not check his BP at home.  Advised if he has any dizziness or other symptoms to note time and date along with symptoms and call the office if needed.  Scheduled to recheck ICM remote transmission on 10/08/2018

## 2018-10-05 NOTE — Telephone Encounter (Signed)
See ICM note for call to patient.

## 2018-10-08 ENCOUNTER — Ambulatory Visit (INDEPENDENT_AMBULATORY_CARE_PROVIDER_SITE_OTHER): Payer: PPO

## 2018-10-08 DIAGNOSIS — Z95810 Presence of automatic (implantable) cardiac defibrillator: Secondary | ICD-10-CM

## 2018-10-08 DIAGNOSIS — I5022 Chronic systolic (congestive) heart failure: Secondary | ICD-10-CM

## 2018-10-09 NOTE — Progress Notes (Signed)
EPIC Encounter for ICM Monitoring  Patient Name: Paul [name] is a 67 y.o. male Date: 10/09/2018 Primary Care Physican: Patient, No Pcp Per Primary Cardiologist:Tilley Electrophysiologist: Allred Last Weight:179-180lbs Today's Weight:  177.6 lbs                                              Heart failure questions and patient asymptomatic   Thoracic impedance returned close to baseline normal after taking 3 days of PRN Furosemide.   Prescribed: Furosemide 20 mg 1 tablet (20 mg total) by mouth daily as needed (WEIGHT GAIN).      Labs: 07/04/2018 Creatinine 1.21, BUN 13, Potassium 4.2, Sodium 142, eGFR 62-71  Care Everywhere 01/15/2018 Creatinine 1.22, BUN 15, Potassium 4.6, Sodium 142, eGFR 61-71  Care Everywhere  Recommendations:  No changes and encouraged to call for any changes.  Follow-up plan: ICM clinic phone appointment on 11/08/2018.       Copy of ICM check sent to Dr. Rayann Heman.   3 month ICM trend: 10/08/2018    1 Year ICM trend:       [name], RN 10/09/2018 2:43 PM

## 2018-10-11 DIAGNOSIS — R05 Cough: Secondary | ICD-10-CM | POA: Diagnosis not present

## 2018-10-11 DIAGNOSIS — L723 Sebaceous cyst: Secondary | ICD-10-CM | POA: Diagnosis not present

## 2018-10-11 DIAGNOSIS — I2510 Atherosclerotic heart disease of native coronary artery without angina pectoris: Secondary | ICD-10-CM | POA: Diagnosis not present

## 2018-10-11 DIAGNOSIS — K219 Gastro-esophageal reflux disease without esophagitis: Secondary | ICD-10-CM | POA: Diagnosis not present

## 2018-10-11 DIAGNOSIS — I10 Essential (primary) hypertension: Secondary | ICD-10-CM | POA: Diagnosis not present

## 2018-10-11 DIAGNOSIS — E785 Hyperlipidemia, unspecified: Secondary | ICD-10-CM | POA: Diagnosis not present

## 2018-10-11 NOTE — Progress Notes (Signed)
Device clinic nurse reviewed transmission.     Received: 6 days ago  Message Contents  Shiela Mayer, RN  Joshua Zeringue Panda, RN        Ok, has he gained weight? How much has he been drinking? I would see about his pressure, and then forward the information to his MD.

## 2018-10-22 DIAGNOSIS — E785 Hyperlipidemia, unspecified: Secondary | ICD-10-CM | POA: Diagnosis not present

## 2018-10-22 DIAGNOSIS — I2510 Atherosclerotic heart disease of native coronary artery without angina pectoris: Secondary | ICD-10-CM | POA: Diagnosis not present

## 2018-10-22 DIAGNOSIS — I10 Essential (primary) hypertension: Secondary | ICD-10-CM | POA: Diagnosis not present

## 2018-10-26 DIAGNOSIS — R899 Unspecified abnormal finding in specimens from other organs, systems and tissues: Secondary | ICD-10-CM | POA: Diagnosis not present

## 2018-11-06 DIAGNOSIS — E041 Nontoxic single thyroid nodule: Secondary | ICD-10-CM | POA: Diagnosis not present

## 2018-11-06 DIAGNOSIS — E0590 Thyrotoxicosis, unspecified without thyrotoxic crisis or storm: Secondary | ICD-10-CM | POA: Diagnosis not present

## 2018-11-08 ENCOUNTER — Ambulatory Visit (INDEPENDENT_AMBULATORY_CARE_PROVIDER_SITE_OTHER): Payer: PPO

## 2018-11-08 DIAGNOSIS — I255 Ischemic cardiomyopathy: Secondary | ICD-10-CM

## 2018-11-09 ENCOUNTER — Ambulatory Visit (INDEPENDENT_AMBULATORY_CARE_PROVIDER_SITE_OTHER): Payer: PPO

## 2018-11-09 DIAGNOSIS — Z95810 Presence of automatic (implantable) cardiac defibrillator: Secondary | ICD-10-CM

## 2018-11-09 DIAGNOSIS — I5022 Chronic systolic (congestive) heart failure: Secondary | ICD-10-CM

## 2018-11-09 NOTE — Progress Notes (Signed)
Remote ICD transmission.   

## 2018-11-09 NOTE — Progress Notes (Signed)
EPIC Encounter for ICM Monitoring  Patient Name: Paul [name] is a 67 y.o. male Date: 11/09/2018 Primary Care Physican: Patient, No Pcp Per Primary Cardiologist:Tilley Electrophysiologist: Allred Last Weight:177 lbs Today's Weight:181 lbs   Heart failure questions and patient asymptomatic.  Patient asked for new cardiologist to replace Dr Wynonia Lawman and advised Dr Rayann Heman recommended he make an appt with Dr Acie Fredrickson.  Explained why my chart shows changes in appt times and providers.  Thoracic impedance normal.   Prescribed:Furosemide 20 mg 1 tablet (20 mg total) by mouth daily as needed (WEIGHT GAIN).  Labs: 07/04/2018 Michail Sermon, [id number], [id number] Care Everywhere 01/15/2018 Creatinine1.22, BUN15, Potassium4.6, [id number], [id number] Care Everywhere  Recommendations:No changes and encouraged to call for any changes.  Follow-up plan: ICM clinic phone appointment on1/16/2020.   Copy of ICM check sent to Dr.Allred.   3 month ICM trend: 11/09/2018    1 Year ICM trend:       [name], RN 11/09/2018 10:38 AM

## 2018-11-10 LAB — CUP PACEART REMOTE DEVICE CHECK
Battery Voltage: 2.98 V
Brady Statistic AP VS Percent: 27.13 %
Brady Statistic AS VP Percent: 0.04 %
HIGH POWER IMPEDANCE MEASURED VALUE: 73 Ohm
Implantable Lead Implant Date: 20151008
Implantable Lead Implant Date: 20151008
Implantable Lead Location: 753859
Implantable Pulse Generator Implant Date: 20151008
Lead Channel Impedance Value: 342 Ohm
Lead Channel Impedance Value: 418 Ohm
Lead Channel Pacing Threshold Pulse Width: 0.4 ms
Lead Channel Pacing Threshold Pulse Width: 0.4 ms
Lead Channel Sensing Intrinsic Amplitude: 7.375 mV
Lead Channel Setting Pacing Amplitude: 2 V
Lead Channel Setting Sensing Sensitivity: 0.3 mV
MDC IDC LEAD LOCATION: 753860
MDC IDC MSMT BATTERY REMAINING LONGEVITY: 64 mo
MDC IDC MSMT LEADCHNL RA PACING THRESHOLD AMPLITUDE: 0.75 V
MDC IDC MSMT LEADCHNL RA SENSING INTR AMPL: 1.75 mV
MDC IDC MSMT LEADCHNL RA SENSING INTR AMPL: 1.75 mV
MDC IDC MSMT LEADCHNL RV IMPEDANCE VALUE: 342 Ohm
MDC IDC MSMT LEADCHNL RV PACING THRESHOLD AMPLITUDE: 0.875 V
MDC IDC MSMT LEADCHNL RV SENSING INTR AMPL: 7.375 mV
MDC IDC SESS DTM: 20200116081805
MDC IDC SET LEADCHNL RV PACING AMPLITUDE: 2.5 V
MDC IDC SET LEADCHNL RV PACING PULSEWIDTH: 0.4 ms
MDC IDC STAT BRADY AP VP PERCENT: 0.02 %
MDC IDC STAT BRADY AS VS PERCENT: 72.82 %
MDC IDC STAT BRADY RA PERCENT PACED: 26.02 %
MDC IDC STAT BRADY RV PERCENT PACED: 0.05 %

## 2018-11-15 DIAGNOSIS — L089 Local infection of the skin and subcutaneous tissue, unspecified: Secondary | ICD-10-CM | POA: Diagnosis not present

## 2018-11-15 DIAGNOSIS — D440 Neoplasm of uncertain behavior of thyroid gland: Secondary | ICD-10-CM | POA: Diagnosis not present

## 2018-11-15 DIAGNOSIS — L720 Epidermal cyst: Secondary | ICD-10-CM | POA: Diagnosis not present

## 2018-11-15 DIAGNOSIS — E0590 Thyrotoxicosis, unspecified without thyrotoxic crisis or storm: Secondary | ICD-10-CM | POA: Diagnosis not present

## 2018-11-15 MED ORDER — METOPROLOL SUCCINATE ER 50 MG PO TB24: 50.0000 mg | ORAL_TABLET | Freq: Two times a day (BID) | ORAL | 3 refills | Status: DC

## 2018-11-19 ENCOUNTER — Telehealth: Payer: Self-pay

## 2018-11-19 MED ORDER — AMIODARONE HCL 100 MG PO TABS: 100.0000 mg | ORAL_TABLET | Freq: Every day | ORAL | 3 refills | Status: DC

## 2018-11-19 NOTE — Telephone Encounter (Signed)
I spoke to the patient and I will refill his Amiodarone.  He was thankful for the call.

## 2018-11-22 DIAGNOSIS — R221 Localized swelling, mass and lump, neck: Secondary | ICD-10-CM | POA: Diagnosis not present

## 2018-11-22 DIAGNOSIS — Z6828 Body mass index (BMI) 28.0-28.9, adult: Secondary | ICD-10-CM | POA: Diagnosis not present

## 2018-11-22 DIAGNOSIS — R946 Abnormal results of thyroid function studies: Secondary | ICD-10-CM | POA: Diagnosis not present

## 2018-11-22 DIAGNOSIS — E0590 Thyrotoxicosis, unspecified without thyrotoxic crisis or storm: Secondary | ICD-10-CM | POA: Diagnosis not present

## 2018-11-30 ENCOUNTER — Encounter: Payer: Self-pay | Admitting: Nurse Practitioner

## 2018-12-05 DIAGNOSIS — L723 Sebaceous cyst: Secondary | ICD-10-CM | POA: Diagnosis not present

## 2018-12-10 ENCOUNTER — Ambulatory Visit (INDEPENDENT_AMBULATORY_CARE_PROVIDER_SITE_OTHER): Payer: PPO

## 2018-12-10 DIAGNOSIS — I5022 Chronic systolic (congestive) heart failure: Secondary | ICD-10-CM

## 2018-12-10 DIAGNOSIS — Z95810 Presence of automatic (implantable) cardiac defibrillator: Secondary | ICD-10-CM | POA: Diagnosis not present

## 2018-12-12 NOTE — Progress Notes (Signed)
EPIC Encounter for ICM Monitoring  Patient Name: Paul [name] is a 68 y.o. male Date: 12/12/2018 Primary Care Physican: Patient, No Pcp Per Primary Cardiologist:Nahser (transfer from Dr Wynonia Lawman) Electrophysiologist: Allred Last Weight:181 lbs Today's Weight:unknown   Heart failure questionsand patient asymptomatic.  Pt visited endocrinologist Dr Fritzi Mandes at [facility] in the last 2 weeks.  He reported that office was supposed to send copies of Thyroid scan, lab results and office notes.  Endocrinologist advised patient that Amiodarone has caused the thyroid problems.   Thoracic impedancenormal.  Prescribed:Furosemide 20 mg 1 tablet (20 mg total) by mouth daily as needed (WEIGHT GAIN). Has taken PRN Furosemide every other day at the end of January.  Labs: 07/04/2018 Michail Sermon, [id number], [id number] Care Everywhere 01/15/2018 Creatinine1.22, BUN15, Potassium4.6, [id number], [id number] Care Everywhere  Recommendations:No changes and encouraged to call for any changes.  Follow-up plan: ICM clinic phone appointment on3/31/2020.   Office appt 12/19/2018 with [name], NP.    Copy of ICM check sent to Dr. Rayann Heman.   3 month ICM trend: 12/10/2018    1 Year ICM trend:       [name], RN 12/12/2018 9:12 AM

## 2018-12-12 NOTE — Progress Notes (Signed)
Call back to patient and left message stating that Dr Jackalyn Lombard nurse has confirmed the medical records from Endocriniolgist have been received.  Advised Amber has all the information for his appt next week.

## 2018-12-18 NOTE — Progress Notes (Signed)
Electrophysiology Office Note Date: 12/19/2018  ID:  Paul [name], Paul [name] Nov 25, 1950, MRN [id number]  PCP: Patient, No Pcp Per Primary Cardiologist: Wynonia Lawman -> [place] Electrophysiologist: Allred  CC: Routine ICD follow-up  Paul [name] is a 68 y.o. male seen today for Dr Rayann Heman.  He presents today for routine electrophysiology followup.  Since last being seen in our clinic, the patient reports doing relatively well.  He and his wife are building a house in [place] and then plan to move back from [facility]. He has been seen by endocrinology and found to have hyperthyroidism for which he is being treated with Tapazole. Question from endocrine is if he can stop amiodarone.  He denies chest pain, palpitations, dyspnea, PND, orthopnea, nausea, vomiting, dizziness, syncope, edema, weight gain, or early satiety.  He has not had ICD shocks.   Device History: MDT dual chamber ICD implanted 2015 for VT History of appropriate therapy: Yes History of AAD therapy: yes - amiodarone    Past Medical History:  Diagnosis Date  . BPH (benign prostatic hyperplasia)   . CAD (coronary artery disease)    widely patent LAD LIMA graft, widely patent PDA PLR SVG, occluded Diag 1 OM 1 SVG by cardiac CT 2007   . Depression   . GERD (gastroesophageal reflux disease)   . Hyperlipidemia   . IBS (irritable bowel syndrome)   . Ischemic cardiomyopathy    Prior inferior infarct EF 35% by ECHO 07/13/11    . S/P CABG (coronary artery bypass graft)    CABG 06/1998 [place], [place] with LIMA to LAD, SVG to OM, SVG to PD-PL.   OM graft occluded 10/12/2005 by CTA   . Sleep apnea   . Ventricular tachycardia ([place]) 10/15   VT at 188 bpm- unstable requiring external defibrillation   Past Surgical History:  Procedure Laterality Date  . APPENDECTOMY    . CORONARY ARTERY BYPASS GRAFT  1999  . IMPLANTABLE CARDIOVERTER DEFIBRILLATOR IMPLANT N/A 07/31/2014   MDT Gwyneth Revels XT DR ICD implanted by Dr Rayann Heman for secondary treatent  of VT  . LEFT HEART CATHETERIZATION WITH CORONARY/GRAFT ANGIOGRAM N/A 07/30/2014   Procedure: LEFT HEART CATHETERIZATION WITH Beatrix Fetters;  Surgeon: Jacolyn Reedy, MD;  Location: [facility] CATH LAB;  Service: Cardiovascular;  Laterality: N/A;    Current Outpatient Medications  Medication Sig Dispense Refill  . Ascorbic Acid (VITAMIN C) 1000 MG tablet Take 1,000 mg by mouth daily.    Marland Kitchen atorvastatin (LIPITOR) 80 MG tablet Take 80 mg by mouth daily.    . cetirizine (ZYRTEC) 10 MG tablet Take 10 mg by mouth daily.    . clopidogrel (PLAVIX) 75 MG tablet Take 75 mg by mouth daily.    . Coenzyme Q10 50 MG CAPS Take 1 capsule by mouth daily.    Marland Kitchen eplerenone (INSPRA) 25 MG tablet Take 25 mg by mouth daily.    . fenofibrate 160 MG tablet Take 160 mg by mouth daily.    . fluticasone (FLONASE) 50 MCG/ACT nasal spray Place 2 sprays into both nostrils daily. 16 g 6  . folic acid (FOLVITE) 509 MCG tablet Take 800 mcg by mouth daily.     . furosemide (LASIX) 20 MG tablet Take 1 tablet (20 mg total) by mouth daily as needed (WEIGHT GAIN). 90 tablet 3  . methimazole (TAPAZOLE) 10 MG tablet     . metoprolol succinate (TOPROL-XL) 50 MG 24 hr tablet Take 1 tablet (50 mg total) by mouth 2 (two) times daily. Take  with or immediately following a meal. 180 tablet 3  . Multiple Vitamin (MULTIVITAMIN WITH MINERALS) TABS tablet Take 1 tablet by mouth daily.    . nitroGLYCERIN (NITROSTAT) 0.4 MG SL tablet     . Omega-3 Fatty Acids (FISH OIL) 1000 MG CAPS Take 1 capsule by mouth daily.    Marland Kitchen omeprazole (PRILOSEC) 20 MG capsule TAKE 1 CAPSULE TWICE DAILY BEFORE A MEAL. 180 capsule 0  . ramipril (ALTACE) 10 MG capsule Take 10 mg by mouth daily.    . Vitamin E 400 UNITS TABS Take 400 Units by mouth daily.     Marland Kitchen amiodarone (PACERONE) 100 MG tablet Take 1 tablet (100 mg total) by mouth every other day. 90 tablet 3   No current facility-administered medications for this visit.     Allergies:   Carvedilol;  Escitalopram oxalate; Niacin and related; and Spironolactone   Social History: Social History   Socioeconomic History  . Marital status: Married    Spouse name: Not on file  . Number of children: Not on file  . Years of education: Not on file  . Highest education level: Not on file  Occupational History  . Not on file  Social Needs  . Financial resource strain: Not on file  . Food insecurity:    Worry: Not on file    Inability: Not on file  . Transportation needs:    Medical: Not on file    Non-medical: Not on file  Tobacco Use  . Smoking status: Never Smoker  . Smokeless tobacco: Never Used  Substance and Sexual Activity  . Alcohol use: Yes    Alcohol/week: 2.0 standard drinks    Types: 2 Glasses of wine per week  . Drug use: No  . Sexual activity: Not on file  Lifestyle  . Physical activity:    Days per week: Not on file    Minutes per session: Not on file  . Stress: Not on file  Relationships  . Social connections:    Talks on phone: Not on file    Gets together: Not on file    Attends religious service: Not on file    Active member of club or organization: Not on file    Attends meetings of clubs or organizations: Not on file    Relationship status: Not on file  . Intimate partner violence:    Fear of current or ex partner: Not on file    Emotionally abused: Not on file    Physically abused: Not on file    Forced sexual activity: Not on file  Other Topics Concern  . Not on file  Social History Narrative  . Not on file    Family History: Family History  Problem Relation Age of Onset  . Cerebral aneurysm Father   . Hepatitis Brother   . Stroke Maternal Aunt   . Diabetes Neg Hx   . Heart attack Neg Hx   . Hypertension Neg Hx     Review of Systems: All other systems reviewed and are otherwise negative except as noted above.   Physical Exam: VS:  BP 122/76   Pulse 64   Ht 5\' 8"  (1.727 m)   Wt 190 lb (86.2 kg)   SpO2 95%   BMI 28.89 kg/m  ,  BMI Body mass index is 28.89 kg/m.  GEN- The patient is well appearing, alert and oriented x 3 today.   HEENT: normocephalic, atraumatic; sclera clear, conjunctiva pink; hearing intact; oropharynx clear; neck supple  Lungs- Clear to ausculation bilaterally, normal work of breathing.  No wheezes, rales, rhonchi Heart- Regular rate and rhythm  GI- soft, non-tender, non-distended, bowel sounds present  Extremities- no clubbing, cyanosis, or edema  MS- no significant deformity or atrophy Skin- warm and dry, no rash or lesion; ICD pocket well healed Psych- euthymic mood, full affect Neuro- strength and sensation are intact  ICD interrogation- reviewed in detail today,  See PACEART report  EKG:  EKG is not ordered today.  Recent Labs: No results found for requested labs within last 8760 hours.   Wt Readings from Last 3 Encounters:  12/19/18 190 lb (86.2 kg)  12/11/17 186 lb (84.4 kg)  07/12/17 184 lb 12.8 oz (83.8 kg)     Other studies Reviewed: Additional studies/ records that were reviewed today include: endocrine office notes  Assessment and Plan:  1.  Chronic systolic dysfunction/ICM euvolemic today Stable on an appropriate medical regimen Normal ICD function See Pace Art report No changes today He has an appointment to establish with Dr Acie Fredrickson scheduled (previoulsy followed by Dr Wynonia Lawman)  2.  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 We discussed at length today. He is very reluctant to stop amiodarone. He has a fair amount of anxiety regarding the possibility of future ICD therapy. For now, will decrease amiodarone to 100mg  every other day. We discussed washout today.     Current medicines are reviewed at length with the patient today.   The patient does not have concerns regarding his medicines.  The following changes were made today:  Decrease amiodarone to 100mg  every other day  Labs/ tests ordered today include:  none Orders Placed This Encounter  Procedures  . CUP PACEART INCLINIC DEVICE CHECK     Disposition:   Follow up with [facility], Dr Rayann Heman 6 weeks   Signed, [name], NP 12/19/2018 12:30 PM  [address] [place] [address] [phone] (office) [phone] (fax)

## 2018-12-19 ENCOUNTER — Ambulatory Visit: Payer: PPO | Admitting: Nurse Practitioner

## 2018-12-19 ENCOUNTER — Encounter: Payer: Self-pay | Admitting: Nurse Practitioner

## 2018-12-19 VITALS — BP 122/76 | HR 64 | Ht 68.0 in | Wt 190.0 lb

## 2018-12-19 DIAGNOSIS — I472 Ventricular tachycardia, unspecified: Secondary | ICD-10-CM

## 2018-12-19 DIAGNOSIS — I5022 Chronic systolic (congestive) heart failure: Secondary | ICD-10-CM

## 2018-12-19 DIAGNOSIS — I255 Ischemic cardiomyopathy: Secondary | ICD-10-CM

## 2018-12-19 LAB — CUP PACEART INCLINIC DEVICE CHECK
Date Time Interrogation Session: 20200226113133
Implantable Lead Location: 753860
MDC IDC LEAD IMPLANT DT: 20151008
MDC IDC LEAD IMPLANT DT: 20151008
MDC IDC LEAD LOCATION: 753859
MDC IDC PG IMPLANT DT: 20151008

## 2018-12-19 MED ORDER — AMIODARONE HCL 100 MG PO TABS: 100.0000 mg | ORAL_TABLET | ORAL | 3 refills | Status: DC

## 2018-12-19 NOTE — Patient Instructions (Addendum)
Medication Instructions:  Take Amiodarone 100 mg Every Other Day  If you need a refill on your cardiac medications before your next appointment, please call your pharmacy.   Lab work: none If you have labs (blood work) drawn today and your tests are completely normal, you will receive your results only by: Marland Kitchen MyChart Message (if you have MyChart) OR . A paper copy in the mail If you have any lab test that is abnormal or we need to change your treatment, we will call you to review the results.  Testing/Procedures: none  Follow-Up: As Scheduled  Any Other Special Instructions Will Be Listed Below (If Applicable).

## 2018-12-20 ENCOUNTER — Ambulatory Visit: Payer: PPO | Admitting: Cardiovascular Disease

## 2018-12-20 ENCOUNTER — Encounter: Payer: Self-pay | Admitting: Cardiovascular Disease

## 2018-12-20 VITALS — BP 118/72 | HR 59 | Ht 68.0 in | Wt 191.4 lb

## 2018-12-20 DIAGNOSIS — I5022 Chronic systolic (congestive) heart failure: Secondary | ICD-10-CM

## 2018-12-20 DIAGNOSIS — Z951 Presence of aortocoronary bypass graft: Secondary | ICD-10-CM

## 2018-12-20 DIAGNOSIS — I2510 Atherosclerotic heart disease of native coronary artery without angina pectoris: Secondary | ICD-10-CM

## 2018-12-20 MED ORDER — NITROGLYCERIN 0.4 MG SL SUBL: 0.4000 mg | SUBLINGUAL_TABLET | SUBLINGUAL | 5 refills | Status: DC | PRN

## 2018-12-20 NOTE — Patient Instructions (Signed)
Medication Instructions:  Your physician recommends that you continue on your current medications as directed. Please refer to the Current Medication list given to you today.  If you need a refill on your cardiac medications before your next appointment, please call your pharmacy.    Lab work: None Ordered   Testing/Procedures: Your physician has requested that you have an echocardiogram. Echocardiography is a painless test that uses sound waves to create images of your heart. It provides your doctor with information about the size and shape of your heart and how well your heart's chambers and valves are working. This procedure takes approximately one hour. There are no restrictions for this procedure.  Your physician has requested that you have a lexiscan myoview. For further information please visit [email or web address]. Please follow instruction sheet, as given.    Follow-Up: At [facility], you and your health needs are our priority.  As part of our continuing mission to provide you with exceptional heart care, we have created designated Provider Care Teams.  These Care Teams include your primary Cardiologist (physician) and Advanced Practice Providers (APPs -  Physician Assistants and Nurse Practitioners) who all work together to provide you with the care you need, when you need it. You will need a follow up appointment in:  6 months.  Please call our office 2 months in advance to schedule this appointment.  You may see Mertie Moores, MD or one of the following Advanced Practice Providers on your designated Care Team: Richardson Dopp, PA-C [place], [place] . Daune Perch, NP

## 2018-12-20 NOTE — Progress Notes (Signed)
Cardiology Office Note:    Date:  12/20/2018   ID:  Paul [name], Paul [name] 01/26/1951, MRN [id number]  PCP:  Chesley Noon, MD  Cardiologist:  Tollie Eth, MD  , now  Mertie Moores, MD  Electrophysiologist:  None   Referring MD: No ref. provider found   Chief Complaint  Patient presents with  . Congestive Heart Failure     History of Present Illness:    Paul [name] is a 68 y.o. male with a hx of coronary artery disease -status post coronary artery bypass grafting.Marland Kitchen  He has an ischemic cardiomyopathy.  Had a defibrillator placed.  He has a history of hyperlipidemia Has been a patient of Dr. Wynonia Lawman.  We are seeing him all Dr. Wynonia Lawman is out on medical leave. He has a history of ventricular tachycardia and is on amiodarone 200 mg a day.  Seen with wife, Neoma Laming  Has been doing well  Had CABG in 1999. Had ICD in 2015 ,  Had VT in 2017 and had AICD discharge ,  Was started on Amiodarone at that time  His last cath in 2015 showed patent LIMA to LAD ,  3 patent grafts.   Has been having some stress related chest pain  Has not been exerciseing recently  [place]   .  They are moving back to [place] this next year.   Last echo was in Jan. 2017  Showed EF 25-30%.    No angina ,  Just chest discomfort related to mental stress.  Is on altace Has not been on Entresto -  Concerned about cost   Past Medical History:  Diagnosis Date  . BPH (benign prostatic hyperplasia)   . CAD (coronary artery disease)    widely patent LAD LIMA graft, widely patent PDA PLR SVG, occluded Diag 1 OM 1 SVG by cardiac CT 2007   . Depression   . GERD (gastroesophageal reflux disease)   . Hyperlipidemia   . IBS (irritable bowel syndrome)   . Ischemic cardiomyopathy    Prior inferior infarct EF 35% by ECHO 07/13/11    . S/P CABG (coronary artery bypass graft)    CABG 06/1998 [place], [place] with LIMA to LAD, SVG to OM, SVG to PD-PL.   OM graft occluded 10/12/2005 by  CTA   . Sleep apnea   . Ventricular tachycardia ([place]) 10/15   VT at 188 bpm- unstable requiring external defibrillation    Past Surgical History:  Procedure Laterality Date  . APPENDECTOMY    . CORONARY ARTERY BYPASS GRAFT  1999  . IMPLANTABLE CARDIOVERTER DEFIBRILLATOR IMPLANT N/A 07/31/2014   MDT Gwyneth Revels XT DR ICD implanted by Dr Rayann Heman for secondary treatent of VT  . LEFT HEART CATHETERIZATION WITH CORONARY/GRAFT ANGIOGRAM N/A 07/30/2014   Procedure: LEFT HEART CATHETERIZATION WITH Beatrix Fetters;  Surgeon: Jacolyn Reedy, MD;  Location: [facility] CATH LAB;  Service: Cardiovascular;  Laterality: N/A;    Current Medications: Current Meds  Medication Sig  . amiodarone (PACERONE) 100 MG tablet Take 1 tablet (100 mg total) by mouth every other day.  . Ascorbic Acid (VITAMIN C) 1000 MG tablet Take 1,000 mg by mouth daily.  Marland Kitchen atorvastatin (LIPITOR) 80 MG tablet Take 80 mg by mouth daily.  . cetirizine (ZYRTEC) 10 MG tablet Take 10 mg by mouth daily.  . clopidogrel (PLAVIX) 75 MG tablet Take 75 mg by mouth daily.  . Coenzyme Q10 50 MG CAPS Take 1 capsule by mouth daily.  Marland Kitchen  eplerenone (INSPRA) 25 MG tablet Take 25 mg by mouth daily.  . fenofibrate 160 MG tablet Take 160 mg by mouth daily.  . fluticasone (FLONASE) 50 MCG/ACT nasal spray Place 2 sprays into both nostrils daily.  . folic acid (FOLVITE) 683 MCG tablet Take 800 mcg by mouth daily.   . furosemide (LASIX) 20 MG tablet Take 1 tablet (20 mg total) by mouth daily as needed (WEIGHT GAIN).  Marland Kitchen methimazole (TAPAZOLE) 10 MG tablet Take 20 mg by mouth daily.   . metoprolol succinate (TOPROL-XL) 50 MG 24 hr tablet Take 1 tablet (50 mg total) by mouth 2 (two) times daily. Take with or immediately following a meal.  . Multiple Vitamin (MULTIVITAMIN WITH MINERALS) TABS tablet Take 1 tablet by mouth daily.  . nitroGLYCERIN (NITROSTAT) 0.4 MG SL tablet Place 1 tablet (0.4 mg total) under the tongue every 5 (five) minutes as needed for chest  pain.  . Omega-3 Fatty Acids (FISH OIL) 1000 MG CAPS Take 1 capsule by mouth daily.  Marland Kitchen omeprazole (PRILOSEC) 20 MG capsule TAKE 1 CAPSULE TWICE DAILY BEFORE A MEAL.  . ramipril (ALTACE) 10 MG capsule Take 10 mg by mouth daily.  . Vitamin E 400 UNITS TABS Take 400 Units by mouth daily.   . [DISCONTINUED] nitroGLYCERIN (NITROSTAT) 0.4 MG SL tablet      Allergies:   Carvedilol; Escitalopram oxalate; Niacin and related; and Spironolactone   Social History   Socioeconomic History  . Marital status: Married    Spouse name: Not on file  . Number of children: Not on file  . Years of education: Not on file  . Highest education level: Not on file  Occupational History  . Not on file  Social Needs  . Financial resource strain: Not on file  . Food insecurity:    Worry: Not on file    Inability: Not on file  . Transportation needs:    Medical: Not on file    Non-medical: Not on file  Tobacco Use  . Smoking status: Never Smoker  . Smokeless tobacco: Never Used  Substance and Sexual Activity  . Alcohol use: Yes    Alcohol/week: 2.0 standard drinks    Types: 2 Glasses of wine per week  . Drug use: No  . Sexual activity: Not on file  Lifestyle  . Physical activity:    Days per week: Not on file    Minutes per session: Not on file  . Stress: Not on file  Relationships  . Social connections:    Talks on phone: Not on file    Gets together: Not on file    Attends religious service: Not on file    Active member of club or organization: Not on file    Attends meetings of clubs or organizations: Not on file    Relationship status: Not on file  Other Topics Concern  . Not on file  Social History Narrative  . Not on file     Family History: The patient's family history includes Cerebral aneurysm in his father; Hepatitis in his brother; Stroke in his maternal aunt. There is no history of Diabetes, Heart attack, or Hypertension.  ROS:   Please see the history of present illness.      All other systems reviewed and are negative.  EKGs/Labs/Other Studies Reviewed:    The following studies were reviewed today:   EKG: December 20, 2018: Sinus bradycardia 54 beats a minute.  Nonspecific IVCD.  Recent Labs: No results found for  requested labs within last 8760 hours.  Recent Lipid Panel    Component Value Date/Time   CHOL 128 11/11/2016 1513   TRIG 80.0 11/11/2016 1513   HDL 61.00 11/11/2016 1513   CHOLHDL 2 11/11/2016 1513   VLDL 16.0 11/11/2016 1513   LDLCALC 51 11/11/2016 1513    Physical Exam:    VS:  BP 118/72   Pulse (!) 59   Ht 5\' 8"  (1.727 m)   Wt 191 lb 6.4 oz (86.8 kg)   SpO2 97%   BMI 29.10 kg/m     Wt Readings from Last 3 Encounters:  12/20/18 191 lb 6.4 oz (86.8 kg)  12/19/18 190 lb (86.2 kg)  12/11/17 186 lb (84.4 kg)     GEN:  Well nourished, well developed in no acute distress HEENT: Normal NECK: No JVD; No carotid bruits LYMPHATICS: No lymphadenopathy CARDIAC: RRR, no murmurs, rubs, gallops RESPIRATORY:  Clear to auscultation without rales, wheezing or rhonchi  ABDOMEN: Soft, non-tender, non-distended MUSCULOSKELETAL:  No edema; No deformity  SKIN: Warm and dry NEUROLOGIC:  Alert and oriented x 3 PSYCHIATRIC:  Normal affect   ASSESSMENT:    1. Coronary artery disease involving native coronary artery of native heart without angina pectoris   2. Chronic systolic (congestive) heart failure ([place])   3. Hx of CABG    PLAN:    In order of problems listed above:  1. Coronary artery disease: The patient has a history of coronary artery bypass grafting.  His last heart catheterization was 2015 at which time he had a patent LIMA.  He had 3 patent  saphenous vein grafts.  Has been   having some episodes of chest pain particular with mental stress.  He really has not been getting much exercise. He is concerned that his symptoms are very similar to his angina.  We will get a [place] study for further evaluation.  2.  He is  also having some shortness of breath with exertion.  He has had a known ischemic cardiomyopathy with an ejection fraction of 25 to 30%.  We will get an echocardiogram for further assessment of his LV function.  2.  Hyperlipidemia: His last LDL is from February, 2018.  His LDL was 51.  HDL 61.  Total cholesterol is 128.  Continue atorvastatin.  4.  History of ventricular tachycardia: He has an ICD in place.  He is on amiodarone 100 mg every other day.  Medication Adjustments/Labs and Tests Ordered: Current medicines are reviewed at length with the patient today.  Concerns regarding medicines are outlined above.  Orders Placed This Encounter  Procedures  . Myocardial Perfusion Imaging  . EKG 12-Lead  . ECHOCARDIOGRAM COMPLETE   Meds ordered this encounter  Medications  . nitroGLYCERIN (NITROSTAT) 0.4 MG SL tablet    Sig: Place 1 tablet (0.4 mg total) under the tongue every 5 (five) minutes as needed for chest pain.    Dispense:  25 tablet    Refill:  5      Patient Instructions  Medication Instructions:  Your physician recommends that you continue on your current medications as directed. Please refer to the Current Medication list given to you today.  If you need a refill on your cardiac medications before your next appointment, please call your pharmacy.    Lab work: None Ordered   Testing/Procedures: Your physician has requested that you have an echocardiogram. Echocardiography is a painless test that uses sound waves to create images of your heart. It  provides your doctor with information about the size and shape of your heart and how well your heart's chambers and valves are working. This procedure takes approximately one hour. There are no restrictions for this procedure.  Your physician has requested that you have a lexiscan myoview. For further information please visit [email or web address]. Please follow instruction sheet, as given.    Follow-Up: At [facility], you  and your health needs are our priority.  As part of our continuing mission to provide you with exceptional heart care, we have created designated Provider Care Teams.  These Care Teams include your primary Cardiologist (physician) and Advanced Practice Providers (APPs -  Physician Assistants and Nurse Practitioners) who all work together to provide you with the care you need, when you need it. You will need a follow up appointment in:  6 months.  Please call our office 2 months in advance to schedule this appointment.  You may see Mertie Moores, MD or one of the following Advanced Practice Providers on your designated Care Team: Richardson Dopp, PA-C [place], [place] . Daune Perch, NP       Signed, Mertie Moores, MD  12/20/2018 5:27 PM    [facility]

## 2018-12-27 IMAGING — DX DG CHEST 2V
2 series · 2 of 2 positions shown · non-contrast
Comparison: November 14, 2015

CLINICAL DATA: History of coronary artery disease. Cardiac
arrhythmia

EXAM:
CHEST  2 VIEW

[dg chest 2 view (1 of 2)]
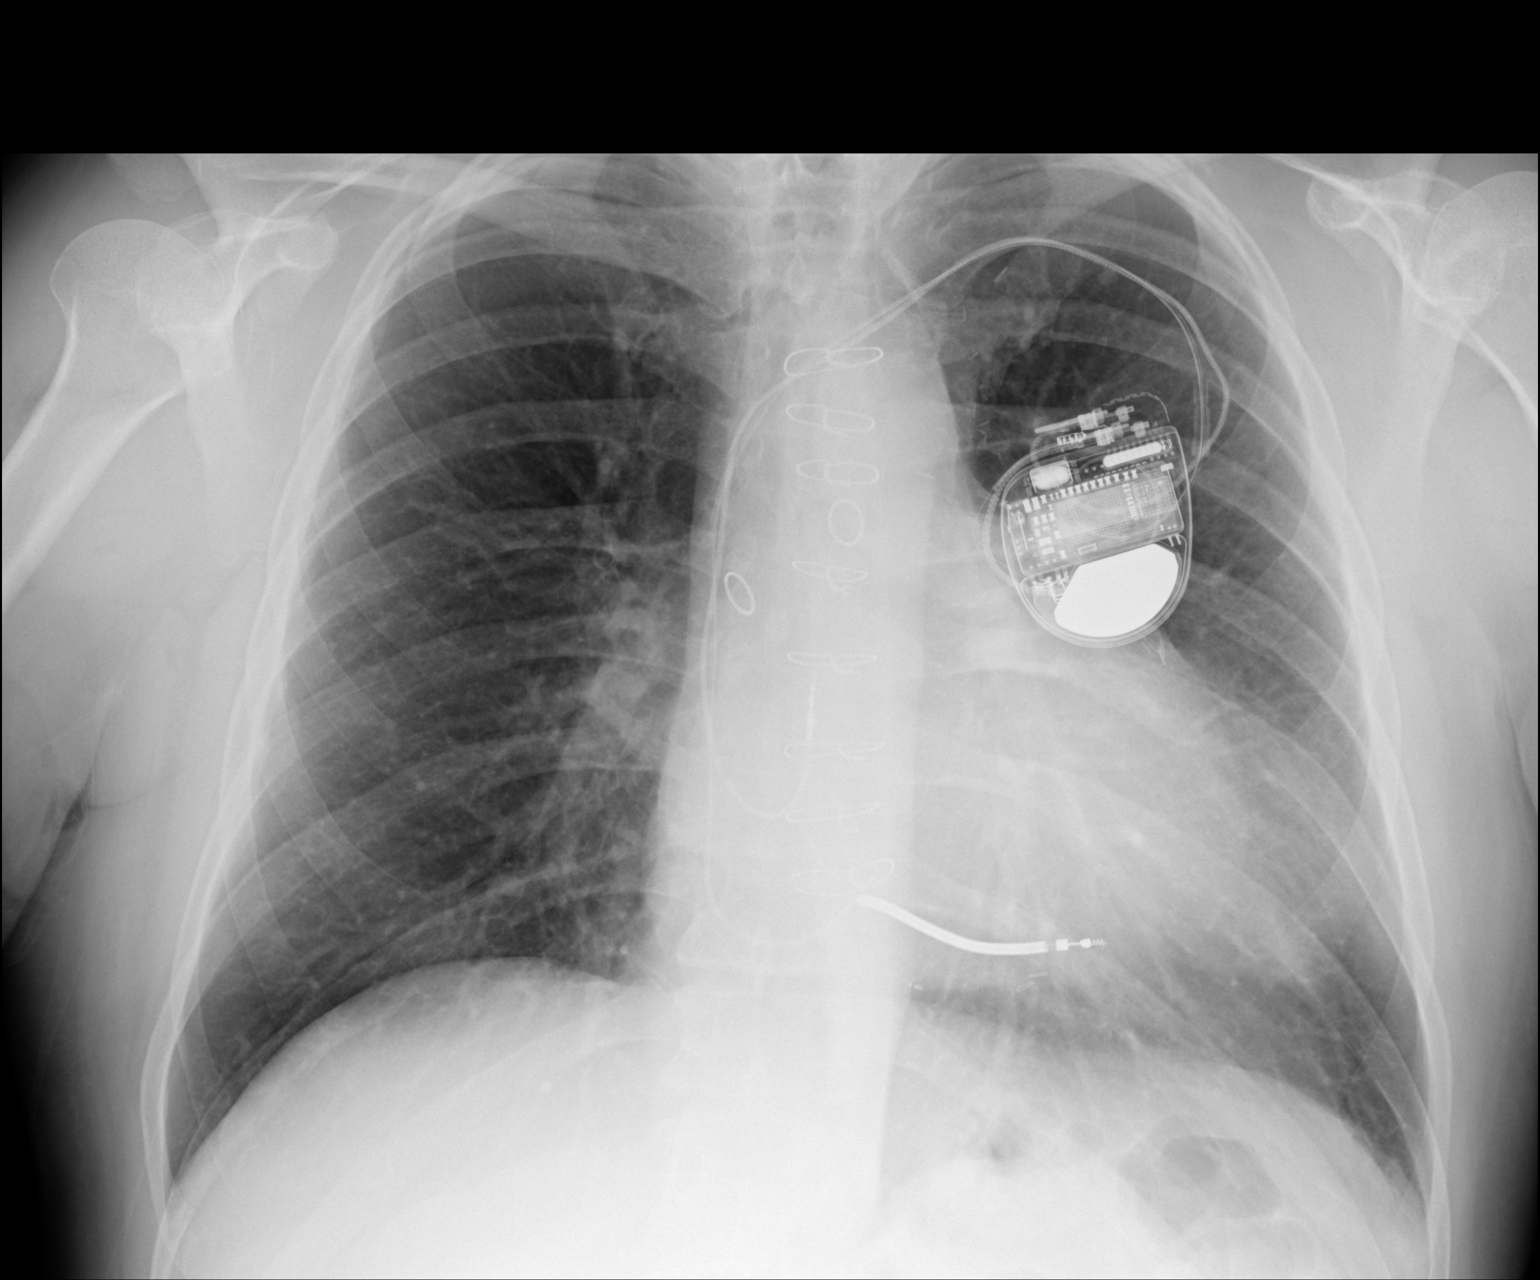

[dg chest 2 view (2 of 2)]
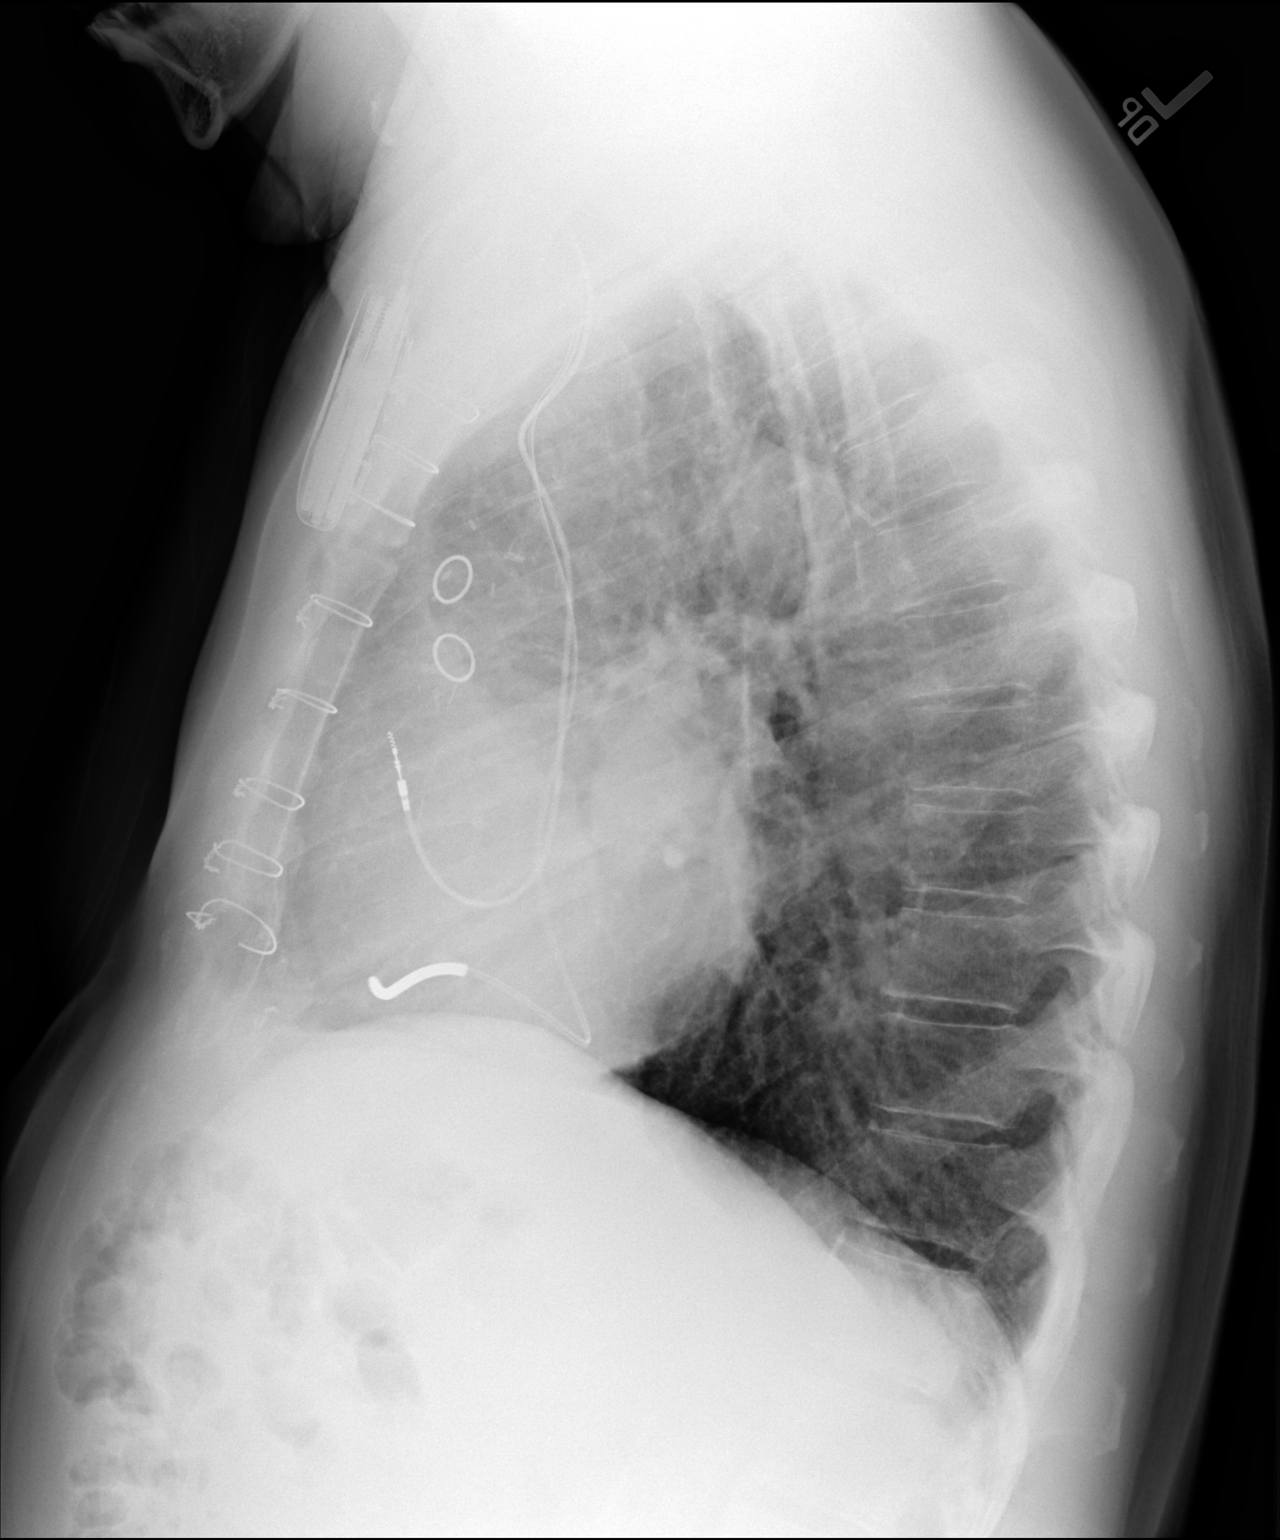

[2 of 2 positions shown; findings below may reference images not displayed]

FINDINGS: There is no edema or consolidation. The heart is upper normal in
size with pulmonary vascularity within normal limits. No adenopathy.
Pacemaker leads are attached to the right atrium and right
ventricle. Patient is status post coronary artery bypass grafting.
No adenopathy. No bone lesions.
IMPRESSION: Status post coronary artery bypass grafting. Pacemaker leads
attached to right atrium and right ventricle. No edema or
consolidation.

## 2019-01-08 DIAGNOSIS — E0590 Thyrotoxicosis, unspecified without thyrotoxic crisis or storm: Secondary | ICD-10-CM | POA: Diagnosis not present

## 2019-01-15 ENCOUNTER — Telehealth: Payer: Self-pay

## 2019-01-15 NOTE — Telephone Encounter (Signed)
Pt is requesting refill for eplerone 25 mg qd. Would Dr. Acie Fredrickson be willing to refill this for him or should he go to his PCP? Please address thank you. Pt requested refill through mychart. [facility] in [place].

## 2019-01-16 ENCOUNTER — Telehealth (INDEPENDENT_AMBULATORY_CARE_PROVIDER_SITE_OTHER): Payer: PPO | Admitting: Internal Medicine

## 2019-01-16 ENCOUNTER — Other Ambulatory Visit: Payer: Self-pay

## 2019-01-16 ENCOUNTER — Other Ambulatory Visit: Payer: Self-pay | Admitting: Internal Medicine

## 2019-01-16 ENCOUNTER — Encounter: Payer: Self-pay | Admitting: *Deleted

## 2019-01-16 DIAGNOSIS — I25810 Atherosclerosis of coronary artery bypass graft(s) without angina pectoris: Secondary | ICD-10-CM | POA: Diagnosis not present

## 2019-01-16 DIAGNOSIS — I255 Ischemic cardiomyopathy: Secondary | ICD-10-CM

## 2019-01-16 MED ORDER — SOTALOL HCL 80 MG PO TABS: 80.0000 mg | ORAL_TABLET | Freq: Two times a day (BID) | ORAL | 4 refills | Status: DC

## 2019-01-16 MED ORDER — METOPROLOL SUCCINATE ER 50 MG PO TB24: 50.0000 mg | ORAL_TABLET | Freq: Every day | ORAL | 1 refills | Status: DC

## 2019-01-16 MED ORDER — EPLERENONE 25 MG PO TABS: 25.0000 mg | ORAL_TABLET | Freq: Every day | ORAL | 0 refills | Status: DC

## 2019-01-16 NOTE — Addendum Note (Signed)
Addended by: Emmaline Life on: 01/16/2019 04:11 PM   Modules accepted: Orders

## 2019-01-16 NOTE — Progress Notes (Signed)
Electrophysiology TeleHealth Note   Due to national recommendations of social distancing due to [place] 19, an audio telehealth visit is felt to be most appropriate for this patient at this time.     Date:  01/16/2019   ID:  Paul [name], DOB [birth year], MRN [id number]  Location: patient's home  Provider location: [address], [place]  Evaluation Performed: Follow-up visit  PCP:  Chesley Noon, MD  Cardiologist:  Mertie Moores, MD  Electrophysiologist:  Dr Rayann Heman  Chief Complaint:  CAD  History of Present Illness:    Paul [name] is a 68 y.o. male who presents via audio/video conferencing for a telehealth visit today.  Since last being seen in our clinic, the patient reports doing very well.  Today, he denies symptoms of palpitations, chest pain, shortness of breath,  lower extremity edema, dizziness, presyncope, or syncope.  The patient is otherwise without complaint today.  The patient denies symptoms of fevers, chills, cough, or new SOB worrisome for COVID 19.   Past Medical History:  Diagnosis Date  . BPH (benign prostatic hyperplasia)   . CAD (coronary artery disease)    widely patent LAD LIMA graft, widely patent PDA PLR SVG, occluded Diag 1 OM 1 SVG by cardiac CT 2007   . Depression   . GERD (gastroesophageal reflux disease)   . Hyperlipidemia   . IBS (irritable bowel syndrome)   . Ischemic cardiomyopathy    Prior inferior infarct EF 35% by ECHO 07/13/11    . S/P CABG (coronary artery bypass graft)    CABG 06/1998 [place] with LIMA to LAD, SVG to OM, SVG to PD-PL.   OM graft occluded 10/12/2005 by CTA   . Sleep apnea   . Ventricular tachycardia ([place]) 10/15   VT at 188 bpm- unstable requiring external defibrillation    Past Surgical History:  Procedure Laterality Date  . APPENDECTOMY    . CORONARY ARTERY BYPASS GRAFT  1999  . IMPLANTABLE CARDIOVERTER DEFIBRILLATOR IMPLANT N/A 07/31/2014   MDT Gwyneth Revels XT DR ICD implanted by Dr Rayann Heman  for secondary treatent of VT  . LEFT HEART CATHETERIZATION WITH CORONARY/GRAFT ANGIOGRAM N/A 07/30/2014   Procedure: LEFT HEART CATHETERIZATION WITH Beatrix Fetters;  Surgeon: Jacolyn Reedy, MD;  Location: [facility] CATH LAB;  Service: Cardiovascular;  Laterality: N/A;    Current Outpatient Medications  Medication Sig Dispense Refill  . amiodarone (PACERONE) 100 MG tablet Take 1 tablet (100 mg total) by mouth every other day. 90 tablet 3  . Ascorbic Acid (VITAMIN C) 1000 MG tablet Take 1,000 mg by mouth daily.    Marland Kitchen atorvastatin (LIPITOR) 80 MG tablet Take 80 mg by mouth daily.    . cetirizine (ZYRTEC) 10 MG tablet Take 10 mg by mouth daily.    . clopidogrel (PLAVIX) 75 MG tablet Take 75 mg by mouth daily.    . Coenzyme Q10 50 MG CAPS Take 1 capsule by mouth daily.    Marland Kitchen eplerenone (INSPRA) 25 MG tablet Take 25 mg by mouth daily.    . fenofibrate 160 MG tablet Take 160 mg by mouth daily.    . fluticasone (FLONASE) 50 MCG/ACT nasal spray Place 2 sprays into both nostrils daily. 16 g 6  . folic acid (FOLVITE) 299 MCG tablet Take 800 mcg by mouth daily.     . furosemide (LASIX) 20 MG tablet Take 1 tablet (20 mg total) by mouth daily as needed (WEIGHT GAIN). 90 tablet 3  . methimazole (  TAPAZOLE) 10 MG tablet Take 20 mg by mouth daily.     . metoprolol succinate (TOPROL-XL) 50 MG 24 hr tablet Take 1 tablet (50 mg total) by mouth 2 (two) times daily. Take with or immediately following a meal. 180 tablet 3  . Multiple Vitamin (MULTIVITAMIN WITH MINERALS) TABS tablet Take 1 tablet by mouth daily.    . nitroGLYCERIN (NITROSTAT) 0.4 MG SL tablet Place 1 tablet (0.4 mg total) under the tongue every 5 (five) minutes as needed for chest pain. 25 tablet 5  . Omega-3 Fatty Acids (FISH OIL) 1000 MG CAPS Take 1 capsule by mouth daily.    Marland Kitchen omeprazole (PRILOSEC) 20 MG capsule TAKE 1 CAPSULE TWICE DAILY BEFORE A MEAL. 180 capsule 0  . ramipril (ALTACE) 10 MG capsule Take 10 mg by mouth daily.    . Vitamin E  400 UNITS TABS Take 400 Units by mouth daily.      No current facility-administered medications for this visit.     Allergies:   Carvedilol; Escitalopram oxalate; Niacin and related; and Spironolactone   Social History:  The patient  reports that he has never smoked. He has never used smokeless tobacco. He reports current alcohol use of about 2.0 standard drinks of alcohol per week. He reports that he does not use drugs.   Family History:  The patient's  family history includes Cerebral aneurysm in his father; Hepatitis in his brother; Stroke in his maternal aunt.   ROS:  Please see the history of present illness.   All other systems are personally reviewed and negative.    Exam:    Vital Signs:  Does not check from home  Well sounding, anxious about stopping amiodarone   Labs/Other Tests and Data Reviewed:    Recent Labs: No results found for requested labs within last 8760 hours.   Wt Readings from Last 3 Encounters:  12/20/18 191 lb 6.4 oz (86.8 kg)  12/19/18 190 lb (86.2 kg)  12/11/17 186 lb (84.4 kg)     Other studies personally reviewed: Additional studies/ records that were reviewed today include:  EP NPs recent note, Dr Elmarie Shiley recent note  Review of the above records today demonstrates: as above Prior radiographs: not applicable   ekg 03/30/3709- sinus 57 bpm, PR 154 msec, QRS 128 msec, Qtc 459 msec  Last device remote is reviewed from [place] PDF dated 11/08/2018 which reveals normal device function, no arrhythmias    ASSESSMENT & PLAN:    1.  VT Last episode 11/13/15 Doing well with amiodarone 100mg  every other day (recently reduced by EP NP due to hyperthyroidism I have advised stopping amiodarone given persistent hyperthyroidism.  He is very anxious about this.  He would prefer to start another AAD. I will therefore stop amiodarone today. After 72 hours, start sotalol 80mg  BID Reduce toprol to 50mg  daily today  2. Chronic systolic dysfunction/ ischemic  cm/CAD No ischemic symptoms No CHF symptoms Followed by [facility]  COVID 19 screen The patient denies symptoms of COVID 19 at this time.  The importance of social distancing was discussed today.  Follow-up:  EP NP in 4 months Will need an ekg at that time. Next remote: scheduled  Current medicines are reviewed at length with the patient today.   The patient does not have concerns regarding his medicines.  The following changes were made today:  none  Labs/ tests ordered today include:  No orders of the defined types were placed in this encounter.  Patient Risk:  after full review of this patients clinical status, I feel that they are at moderate risk at this time.  Today, I have spent 13 minutes with the patient with telehealth technology discussing VT .    SignedThompson Grayer, MD  01/16/2019 3:17 PM     [place] [address] [phone] (office) [phone] (fax)

## 2019-01-16 NOTE — Telephone Encounter (Signed)
OK to refill eplerone 25 mg a day ( its very similar to Spironolactone)  Will follow up with [place] in several months

## 2019-01-16 NOTE — Telephone Encounter (Signed)
Refill sent to patients pharmacy. 

## 2019-01-16 NOTE — Patient Instructions (Addendum)
Medication Instructions:  Your physician has recommended you make the following change in your medication:  1. STOP Amiodarone today 2. START Sotalol 80 mg twice daily -- START THIS MEDICATION Saturday 3. DECREASE Toprol to 50 ONCE daily  * If you need a refill on your cardiac medications before your next appointment, please call your pharmacy.   Labwork: None ordered  Testing/Procedures: None ordered  Follow-Up: Your physician recommends that you schedule a follow-up appointment in: 4 months with Dr. Rayann Heman.  Thank you for choosing [facility]!!   Trinidad Curet, RN [phone]  Any Other Special Instructions Will Be Listed Below (If Applicable).

## 2019-01-18 ENCOUNTER — Telehealth: Payer: Self-pay

## 2019-01-18 NOTE — Telephone Encounter (Signed)
New message   Spoke with patient per staff messages from Dr. Radford Pax echocardiogram and myocardial perfusion has been rescheduled to 02/21/19.

## 2019-01-21 MED ORDER — ATORVASTATIN CALCIUM 80 MG PO TABS: 80.0000 mg | ORAL_TABLET | Freq: Every day | ORAL | 3 refills | Status: DC

## 2019-01-22 ENCOUNTER — Ambulatory Visit (INDEPENDENT_AMBULATORY_CARE_PROVIDER_SITE_OTHER): Payer: PPO

## 2019-01-22 ENCOUNTER — Other Ambulatory Visit: Payer: Self-pay

## 2019-01-22 DIAGNOSIS — I5022 Chronic systolic (congestive) heart failure: Secondary | ICD-10-CM

## 2019-01-22 DIAGNOSIS — Z95810 Presence of automatic (implantable) cardiac defibrillator: Secondary | ICD-10-CM

## 2019-01-25 NOTE — Progress Notes (Signed)
EPIC Encounter for ICM Monitoring  Patient Name: Paul [name] is a 68 y.o. male Date: 01/25/2019 Primary Care Physican: Chesley Noon, MD Primary Cardiologist:Nahser (transfer from Dr Wynonia Lawman) Electrophysiologist: Allred 01/25/2019 Weight:186 - 190 lbs   Heart failure questionsand patient asymptomatic.  He has been in the processing of moving back to this area in the last month and eating restaurant foods more often than usual    Thoracic impedanceabnormal suggesting fluid accumulation since 12/18/2018.  Prescribed:Furosemide 20 mg 1 tablet (20 mg total) by mouth daily as needed (WEIGHT GAIN). Has taken PRN Furosemide every other day at the end of January.  Labs: 07/04/2018 Lenor Derrick, Potassium4.2, [id number], [id number] Care Everywhere 01/15/2018 Creatinine1.22, BUN15, Potassium4.6, [id number], [id number] Care Everywhere  Recommendations:Advised to take PRN Furosemide x 3 days and then return to PRN.    Follow-up plan: ICM clinic phone appointment on4/03/2019 to recheck fluid levels and he said if the report returns to normal he does not need a call back.     Copy of ICM check sent to Dr. Rayann Heman..  3 month ICM trend: 01/22/2019    1 Year ICM trend:       [name], RN 01/25/2019 3:40 PM

## 2019-01-28 ENCOUNTER — Other Ambulatory Visit: Payer: Self-pay

## 2019-01-28 ENCOUNTER — Ambulatory Visit (INDEPENDENT_AMBULATORY_CARE_PROVIDER_SITE_OTHER): Payer: PPO

## 2019-01-28 DIAGNOSIS — Z95810 Presence of automatic (implantable) cardiac defibrillator: Secondary | ICD-10-CM

## 2019-01-28 DIAGNOSIS — I5022 Chronic systolic (congestive) heart failure: Secondary | ICD-10-CM

## 2019-01-29 NOTE — Progress Notes (Signed)
EPIC Encounter for ICM Monitoring  Patient Name: Carel Carrier is a 68 y.o. male Date: 01/29/2019 Primary Care Physican: Chesley Noon, MD Primary Cardiologist:Nahser (transfer from Dr Wynonia Lawman) Electrophysiologist: Allred 01/25/2019 Weight:186 - 190 lbs   Transmission reviewed.   Thoracic impedanceslightly below baseline but did return to normal after last remote transmission.  Prescribed:Furosemide 20 mg 1 tablet (20 mg total) by mouth daily as needed (WEIGHT GAIN). Has taken PRN Furosemide every other day at the end of January.  Labs: 07/04/2018 Michail Sermon, [id number], [id number] Care Everywhere 01/15/2018 Pennelope Bracken, [id number], [id number] Care Everywhere  Recommendations:None   Follow-up plan: ICM clinic phone appointment on5/02/2019.  Copy of ICM check sent to Dr.Allred..  3 month ICM trend: 01/28/2019    1 Year ICM trend:       [name], RN 01/29/2019 5:32 PM

## 2019-02-04 ENCOUNTER — Other Ambulatory Visit (HOSPITAL_COMMUNITY): Payer: PPO

## 2019-02-04 ENCOUNTER — Encounter (HOSPITAL_COMMUNITY): Payer: PPO

## 2019-02-07 ENCOUNTER — Ambulatory Visit (INDEPENDENT_AMBULATORY_CARE_PROVIDER_SITE_OTHER): Payer: PPO | Admitting: *Deleted

## 2019-02-07 ENCOUNTER — Other Ambulatory Visit: Payer: Self-pay

## 2019-02-07 DIAGNOSIS — I255 Ischemic cardiomyopathy: Secondary | ICD-10-CM

## 2019-02-07 LAB — CUP PACEART REMOTE DEVICE CHECK
Battery Remaining Longevity: 57 mo
Battery Voltage: 2.98 V
Brady Statistic AP VP Percent: 0.04 %
Brady Statistic AP VS Percent: 38.95 %
Brady Statistic AS VP Percent: 0.04 %
Brady Statistic AS VS Percent: 60.97 %
Brady Statistic RA Percent Paced: 35.2 %
Brady Statistic RV Percent Paced: 0.08 %
Date Time Interrogation Session: 20200416084222
HighPow Impedance: 66 Ohm
Implantable Lead Implant Date: 20151008
Implantable Lead Implant Date: 20151008
Implantable Lead Location: 753859
Implantable Lead Location: 753860
Implantable Lead Model: 5076
Implantable Lead Model: 6935
Implantable Pulse Generator Implant Date: 20151008
Lead Channel Impedance Value: 285 Ohm
Lead Channel Impedance Value: 342 Ohm
Lead Channel Impedance Value: 456 Ohm
Lead Channel Pacing Threshold Amplitude: 0.75 V
Lead Channel Pacing Threshold Amplitude: 1 V
Lead Channel Pacing Threshold Pulse Width: 0.4 ms
Lead Channel Pacing Threshold Pulse Width: 0.4 ms
Lead Channel Sensing Intrinsic Amplitude: 1.5 mV
Lead Channel Sensing Intrinsic Amplitude: 7 mV
Lead Channel Setting Pacing Amplitude: 2 V
Lead Channel Setting Pacing Amplitude: 2.5 V
Lead Channel Setting Pacing Pulse Width: 0.4 ms
Lead Channel Setting Sensing Sensitivity: 0.3 mV

## 2019-02-12 ENCOUNTER — Telehealth: Payer: Self-pay | Admitting: Cardiovascular Disease

## 2019-02-12 DIAGNOSIS — I255 Ischemic cardiomyopathy: Secondary | ICD-10-CM

## 2019-02-12 NOTE — Telephone Encounter (Signed)
Mr. Sison provided feedback on his Cliff that he and his wife are building a house in [place]. His wife is already established with Dr. Bronson Ing and he would like to see Dr. Raliegh Ip.  Dr. Acie Fredrickson and Dr. Bronson Ing, per our policy, will you both document in this phone note your approval or disapprove for this transfer request?

## 2019-02-12 NOTE — Telephone Encounter (Signed)
That’s fine with me

## 2019-02-12 NOTE — Telephone Encounter (Signed)
That is fine with me.

## 2019-02-13 ENCOUNTER — Encounter: Payer: Self-pay | Admitting: Cardiology

## 2019-02-13 NOTE — Progress Notes (Signed)
Remote ICD transmission.   

## 2019-02-14 ENCOUNTER — Telehealth: Payer: Self-pay | Admitting: Cardiovascular Disease

## 2019-02-14 NOTE — Addendum Note (Signed)
Addended by: Barbarann Ehlers A on: 02/14/2019 01:35 PM   Modules accepted: Orders

## 2019-02-14 NOTE — Telephone Encounter (Signed)
   Primary Cardiologist: Prudy Feeler   Pt is scheduled for echo and lexiscan myoview on April 30   Patient contacted.  History reviewed.    I have reviewed chart.  Talked to his wife. He is off amio ( because of thyroid issues ) Overall seems to be doing OK  No recurrent CP or dyspnea  I think that patient can safely be rescheduled  Priority level 2 for both tests.    No symptoms to suggest any unstable cardiac conditions.  Based on discussion, with current pandemic situation, we will be postponing this appointment for Paul [name] with a plan for f/u in 6-12  wks or sooner if feasible/necessary.  If symptoms change, he has been instructed to contact our office.   Routing to C19 CANCEL pool for tracking (P CV DIV CV19 CANCEL - reason for visit "other.") and assigning priority ( 2 = 6-12 wks ).   Mertie Moores, MD  02/14/2019 9:42 AM         .

## 2019-02-14 NOTE — Telephone Encounter (Signed)
I sent Lexiscan/Echo  instructions along with date/time to patient via MyChart

## 2019-02-14 NOTE — Telephone Encounter (Signed)
Thank you.  I've updated the recall for Sept 2020 to be for Dr. Raliegh Ip.   I am routing this to Dr. Radene Knee team so that they can follow up the testing needed per 02/14/2019 phone note/patient message.

## 2019-02-14 NOTE — Telephone Encounter (Signed)
New order placed for Lexiscan to be done along with echo at [facility], as Dr.koneswaran will become his new primary cardiologist      [02/14/2019 1:21 PM]  Reece Levy:   03/28/2019 arrive @ 10 to radiology, nothing to eat/drink after midnight

## 2019-02-21 ENCOUNTER — Encounter (HOSPITAL_COMMUNITY): Payer: PPO

## 2019-02-21 ENCOUNTER — Other Ambulatory Visit (HOSPITAL_COMMUNITY): Payer: PPO

## 2019-02-26 ENCOUNTER — Other Ambulatory Visit: Payer: Self-pay

## 2019-02-26 ENCOUNTER — Ambulatory Visit (INDEPENDENT_AMBULATORY_CARE_PROVIDER_SITE_OTHER): Payer: PPO

## 2019-02-26 DIAGNOSIS — I5022 Chronic systolic (congestive) heart failure: Secondary | ICD-10-CM | POA: Diagnosis not present

## 2019-02-26 DIAGNOSIS — Z95810 Presence of automatic (implantable) cardiac defibrillator: Secondary | ICD-10-CM | POA: Diagnosis not present

## 2019-02-26 NOTE — Progress Notes (Signed)
EPIC Encounter for ICM Monitoring  Patient Name: Paul [name] is a 68 y.o. male Date: 02/26/2019 Primary Care Physican: Chesley Noon, MD Primary [place] Electrophysiologist: Allred 4/3/2020Weight:186 - 190 lbs   Heart failure questions reviewed and pt asymptomatic for fluid symptoms. He thinks Furosemide upsets stomach, nausea, and has a feeling of being washed out.  He is going to message Dr Acie Fredrickson to ask if there is a replacement for Furosemide.   Thoracic impedancenormal.  Prescribed:Furosemide 20 mg 1 tablet (20 mg total) by mouth daily as needed (WEIGHT GAIN). Has taken PRN Furosemide every other day at the end of January.  Labs: 07/04/2018 Michail Sermon, [id number], [id number] Care Everywhere 01/15/2018 Creatinine1.22, BUN15, Potassium4.6, [id number], [id number] Care Everywhere  Recommendations:No changes and encouraged to call for fluid symptoms.   Follow-up plan: ICM clinic phone appointment on6/05/2019.  Copy of ICM check sent to Dr.Allred.  3 month ICM trend: 02/25/2019    1 Year ICM trend:       [name], RN 02/26/2019 3:05 PM

## 2019-02-28 DIAGNOSIS — E0590 Thyrotoxicosis, unspecified without thyrotoxic crisis or storm: Secondary | ICD-10-CM | POA: Diagnosis not present

## 2019-02-28 DIAGNOSIS — I10 Essential (primary) hypertension: Secondary | ICD-10-CM | POA: Diagnosis not present

## 2019-02-28 DIAGNOSIS — E041 Nontoxic single thyroid nodule: Secondary | ICD-10-CM | POA: Diagnosis not present

## 2019-03-12 DIAGNOSIS — I2510 Atherosclerotic heart disease of native coronary artery without angina pectoris: Secondary | ICD-10-CM | POA: Diagnosis not present

## 2019-03-12 DIAGNOSIS — E7800 Pure hypercholesterolemia, unspecified: Secondary | ICD-10-CM | POA: Diagnosis not present

## 2019-03-12 DIAGNOSIS — E538 Deficiency of other specified B group vitamins: Secondary | ICD-10-CM | POA: Diagnosis not present

## 2019-03-12 DIAGNOSIS — I10 Essential (primary) hypertension: Secondary | ICD-10-CM | POA: Diagnosis not present

## 2019-03-12 DIAGNOSIS — J301 Allergic rhinitis due to pollen: Secondary | ICD-10-CM | POA: Diagnosis not present

## 2019-03-12 DIAGNOSIS — I5042 Chronic combined systolic (congestive) and diastolic (congestive) heart failure: Secondary | ICD-10-CM | POA: Diagnosis not present

## 2019-03-12 DIAGNOSIS — Z95810 Presence of automatic (implantable) cardiac defibrillator: Secondary | ICD-10-CM | POA: Diagnosis not present

## 2019-03-12 DIAGNOSIS — E0590 Thyrotoxicosis, unspecified without thyrotoxic crisis or storm: Secondary | ICD-10-CM | POA: Diagnosis not present

## 2019-03-28 ENCOUNTER — Ambulatory Visit (HOSPITAL_COMMUNITY)
Admission: RE | Admit: 2019-03-28 | Discharge: 2019-03-28 | Disposition: A | Discharge status: Home / Self Care | Payer: PPO | Source: Ambulatory Visit | Attending: Cardiovascular Disease | Admitting: Cardiovascular Disease

## 2019-03-28 ENCOUNTER — Other Ambulatory Visit: Payer: Self-pay

## 2019-03-28 ENCOUNTER — Ambulatory Visit (HOSPITAL_BASED_OUTPATIENT_CLINIC_OR_DEPARTMENT_OTHER)
Admission: RE | Admit: 2019-03-28 | Discharge: 2019-03-28 | Disposition: A | Discharge status: Home / Self Care | Payer: PPO | Source: Ambulatory Visit | Attending: Cardiovascular Disease | Admitting: Cardiovascular Disease

## 2019-03-28 ENCOUNTER — Encounter (HOSPITAL_COMMUNITY): Payer: Self-pay

## 2019-03-28 DIAGNOSIS — I2510 Atherosclerotic heart disease of native coronary artery without angina pectoris: Secondary | ICD-10-CM | POA: Diagnosis not present

## 2019-03-28 DIAGNOSIS — I255 Ischemic cardiomyopathy: Secondary | ICD-10-CM

## 2019-03-28 DIAGNOSIS — I5022 Chronic systolic (congestive) heart failure: Secondary | ICD-10-CM | POA: Insufficient documentation

## 2019-03-28 DIAGNOSIS — Z951 Presence of aortocoronary bypass graft: Secondary | ICD-10-CM | POA: Diagnosis not present

## 2019-03-28 HISTORY — DX: Heart failure, unspecified: I50.9

## 2019-03-28 LAB — NM MYOCAR MULTI W/SPECT W/WALL MOTION / EF
LV dias vol: 199 mL (ref 62–150)
LV sys vol: 147 mL
Peak HR: 76 {beats}/min
RATE: 0.44
Rest HR: 52 {beats}/min
SDS: 0
SRS: 10
SSS: 10
TID: 1.16

## 2019-03-28 MED ORDER — REGADENOSON 0.4 MG/5ML IV SOLN
INTRAVENOUS | Status: AC
  Administered 2019-03-28: 0.4 mg via INTRAVENOUS
  Filled 2019-03-28: qty 5

## 2019-03-28 MED ORDER — TECHNETIUM TC 99M TETROFOSMIN IV KIT
10.0000 | PACK | Freq: Once | INTRAVENOUS | Status: AC | PRN
  Administered 2019-03-28: 10 via INTRAVENOUS

## 2019-03-28 MED ORDER — SODIUM CHLORIDE 0.9% FLUSH
INTRAVENOUS | Status: AC
  Administered 2019-03-28: 10 mL via INTRAVENOUS
  Filled 2019-03-28: qty 10

## 2019-03-28 MED ORDER — TECHNETIUM TC 99M TETROFOSMIN IV KIT
30.0000 | PACK | Freq: Once | INTRAVENOUS | Status: AC | PRN
  Administered 2019-03-28: 31 via INTRAVENOUS

## 2019-03-28 NOTE — Progress Notes (Signed)
*  PRELIMINARY RESULTS* Echocardiogram 2D Echocardiogram has been performed.  Paul [name] 03/28/2019, 2:16 PM

## 2019-04-01 ENCOUNTER — Ambulatory Visit (INDEPENDENT_AMBULATORY_CARE_PROVIDER_SITE_OTHER): Payer: PPO

## 2019-04-01 DIAGNOSIS — Z95810 Presence of automatic (implantable) cardiac defibrillator: Secondary | ICD-10-CM

## 2019-04-01 DIAGNOSIS — I5022 Chronic systolic (congestive) heart failure: Secondary | ICD-10-CM

## 2019-04-03 NOTE — Progress Notes (Signed)
EPIC Encounter for ICM Monitoring  Patient Name: Paul [name] is a 68 y.o. male Date: 04/03/2019 Primary Care Physican: Chesley Noon, MD Primary [place] Electrophysiologist: Allred 04/03/2020Weight:186 - 190 lbs 04/03/2019 Weight: 186 - 189 lbs   Heart failure questions reviewed and pt asymptomatic for fluid symptoms.    Optivol thoracic impedancenormal.  Prescribed:Furosemide 20 mg 1 tablet (20 mg total) by mouth daily as needed (WEIGHT GAIN). Has taken PRN Furosemide every other day at the end of January.  Labs: 11/12/2018 Creatinine 1.15, BUN 14, Potassium 4.3, Sodium 140, GFR 65-76  Recommendations:No changes and encouraged to call for fluid symptoms.   Follow-up plan: ICM clinic phone appointment on7/15/2020.  Copy of ICM check sent to Dr.Allred.  3 month ICM trend: 04/01/2019    1 Year ICM trend:       [name], RN 04/03/2019 11:58 AM

## 2019-04-11 ENCOUNTER — Other Ambulatory Visit: Payer: Self-pay | Admitting: Cardiovascular Disease

## 2019-04-11 NOTE — Telephone Encounter (Signed)
Pt's medication was sent to pt's pharmacy as requested. Confirmation received.  °

## 2019-04-25 DIAGNOSIS — E0590 Thyrotoxicosis, unspecified without thyrotoxic crisis or storm: Secondary | ICD-10-CM | POA: Diagnosis not present

## 2019-04-25 NOTE — Telephone Encounter (Signed)
Transmission received. Since last transmission on 04/01/19, no atrial or ventricular arrhythmia episodes noted. Lead trends stable. 4.5 years remaining on battery. Presenting rhythm shows Ap/Vs @ 63bpm with rare FFRWs. PVC singles 75.5/hr, PVC runs 1.8/hr. Base rate programmed at 50bpm.  Spoke with pt, reassured him that device check is normal. He does not have a way to check his BP at home. Reports compliance with Toprol XL 50mg  daily, sotalol 80mg  BID, and other meds. Advised pt I will forward this message to Dr. Rayann Heman and Sonia Baller, RN, for review/recommendations.

## 2019-05-07 ENCOUNTER — Telehealth: Payer: Self-pay

## 2019-05-07 NOTE — Telephone Encounter (Signed)
Returned patient call as requested by voice mail message. He asked if should send a remote transmission today for fluid level review.  Advised it is scheduled tomorrow but he can send today if needed.  He is feeling fine other than he is having some thyroid issues.  He denies any fluid symptoms.  He stated the report shows fluid leves are normal than I don't need to call him back.

## 2019-05-08 ENCOUNTER — Ambulatory Visit (INDEPENDENT_AMBULATORY_CARE_PROVIDER_SITE_OTHER): Payer: PPO

## 2019-05-08 DIAGNOSIS — Z95810 Presence of automatic (implantable) cardiac defibrillator: Secondary | ICD-10-CM

## 2019-05-08 DIAGNOSIS — I5022 Chronic systolic (congestive) heart failure: Secondary | ICD-10-CM | POA: Diagnosis not present

## 2019-05-09 ENCOUNTER — Ambulatory Visit (INDEPENDENT_AMBULATORY_CARE_PROVIDER_SITE_OTHER): Payer: PPO | Admitting: *Deleted

## 2019-05-09 DIAGNOSIS — I255 Ischemic cardiomyopathy: Secondary | ICD-10-CM | POA: Diagnosis not present

## 2019-05-10 LAB — CUP PACEART REMOTE DEVICE CHECK
Battery Remaining Longevity: 54 mo
Battery Voltage: 2.97 V
Brady Statistic AP VP Percent: 0.05 %
Brady Statistic AP VS Percent: 16.96 %
Brady Statistic AS VP Percent: 0.03 %
Brady Statistic AS VS Percent: 82.97 %
Brady Statistic RA Percent Paced: 16.66 %
Brady Statistic RV Percent Paced: 0.09 %
Date Time Interrogation Session: 20200716094345
HighPow Impedance: 73 Ohm
Implantable Lead Implant Date: 20151008
Implantable Lead Implant Date: 20151008
Implantable Lead Location: 753859
Implantable Lead Location: 753860
Implantable Lead Model: 5076
Implantable Lead Model: 6935
Implantable Pulse Generator Implant Date: 20151008
Lead Channel Impedance Value: 285 Ohm
Lead Channel Impedance Value: 342 Ohm
Lead Channel Impedance Value: 418 Ohm
Lead Channel Pacing Threshold Amplitude: 1 V
Lead Channel Pacing Threshold Amplitude: 1.125 V
Lead Channel Pacing Threshold Pulse Width: 0.4 ms
Lead Channel Pacing Threshold Pulse Width: 0.4 ms
Lead Channel Sensing Intrinsic Amplitude: 1.5 mV
Lead Channel Sensing Intrinsic Amplitude: 1.5 mV
Lead Channel Sensing Intrinsic Amplitude: 7.625 mV
Lead Channel Sensing Intrinsic Amplitude: 7.625 mV
Lead Channel Setting Pacing Amplitude: 2 V
Lead Channel Setting Pacing Amplitude: 2.5 V
Lead Channel Setting Pacing Pulse Width: 0.4 ms
Lead Channel Setting Sensing Sensitivity: 0.3 mV

## 2019-05-10 NOTE — Progress Notes (Signed)
EPIC Encounter for ICM Monitoring  Patient Name: Paul [name] is a 68 y.o. male Date: 05/10/2019 Primary Care Physican: Chesley Noon, MD Primary [place] Electrophysiologist: Allred 04/03/2019 Weight: 186 - 189 lbs   Spoke with patient on 05/07/2019 and he is feeling fine.     Optivol thoracic impedancenormal.  Prescribed:Furosemide 20 mg 1 tablet (20 mg total) by mouth daily as needed (WEIGHT GAIN). Has taken PRN Furosemide every other day at the end of January.  Labs: 11/12/2018 Creatinine 1.15, BUN 14, Potassium 4.3, Sodium 140, GFR 65-76  Recommendations:No changes.  Follow-up plan: ICM clinic phone appointment on8/17/2020.  Copy of ICM check sent to Dr.Allred.  3 month ICM trend: 05/09/2019    1 Year ICM trend:       [name], RN 05/10/2019 11:02 AM

## 2019-05-15 ENCOUNTER — Telehealth: Payer: Self-pay

## 2019-05-15 ENCOUNTER — Other Ambulatory Visit: Payer: Self-pay

## 2019-05-15 MED ORDER — RAMIPRIL 10 MG PO CAPS: 10.0000 mg | ORAL_CAPSULE | Freq: Every day | ORAL | 3 refills | Status: DC

## 2019-05-15 NOTE — Telephone Encounter (Signed)
Refill sent in for Altace.

## 2019-05-15 NOTE — Telephone Encounter (Signed)
Error

## 2019-05-16 NOTE — Progress Notes (Signed)
Remote ICD transmission.   

## 2019-05-24 ENCOUNTER — Other Ambulatory Visit: Payer: Self-pay

## 2019-05-24 ENCOUNTER — Other Ambulatory Visit: Payer: PPO

## 2019-05-24 DIAGNOSIS — R6889 Other general symptoms and signs: Secondary | ICD-10-CM | POA: Diagnosis not present

## 2019-05-24 DIAGNOSIS — Z20822 Contact with and (suspected) exposure to covid-19: Secondary | ICD-10-CM

## 2019-05-26 LAB — NOVEL CORONAVIRUS, NAA: SARS-CoV-2, NAA: NOT DETECTED

## 2019-05-27 ENCOUNTER — Telehealth: Payer: Self-pay

## 2019-05-27 NOTE — Progress Notes (Signed)
Cardiology Office Note Date:  05/28/2019  Patient ID:  Aryav, Wimberly [birth year], MRN [id number] PCP:  Chesley Noon, MD  Cardiologist:  Dr. Acie Fredrickson >>> moved to [facility] and now following with Dr. Bronson Ing Electrophysiologist: Dr. Rayann Heman     Chief Complaint: follow up visit after starting Sotalol  History of Present Illness: Sesar Madewell is a 68 y.o. male with history of CAD (CABG 2007), GERD/IBS, ICM w/ICD, VT w/appropriate therapies.  He comes today to be seen for Dr. Rayann Heman.  He last saw him (via tele-health) March 2020).  He had persistent hyperthyroidism and recommended stopping amio, the patient very reluctant about this and decided to stop amio and start sotalol 80mg  BID and reduced his Toprol to 50mg  daily.  Planned for APP visit in 62mo and EKG check.  He feels well, no CP, palpitations ro SOB, no DOE or symptoms of PND or orthopnea.  He reports when he takes the lasix which he does maybe once or twice a week it makes him feel weak, nauseous.  No near syncope or syncope, no socks.  Tolerating the sotalol. No changes to his exertional capacity  He reports taking the lasix when he thinks he may have eaten something salty like pizza, not particularly for edema or weight gain, SOB.   He is off the Tapazole, and reports he is now hypothyroid, contines to follow with his PMD and endo.  His weight is up, mentions when her got hyperthyroid he lost quite a bot of weight and now is back + a few.  He does not feel like he is retaining water.   COVID education/precautions were discussed with the patient today   Neg COVID 05/24/2019   Device History: MDT dual chamber ICD implanted 2015 for VT History of appropriate therapy: Yes  History of AAD therapy: amiodarone >> Feb 2020 hyperthyroidism >> dose reduced >> march 2020 stopped  March 2020, sotalol started   Past Medical History:  Diagnosis Date  . BPH (benign prostatic hyperplasia)   . CAD (coronary artery  disease)    widely patent LAD LIMA graft, widely patent PDA PLR SVG, occluded Diag 1 OM 1 SVG by cardiac CT 2007   . CHF (congestive heart failure) ([place])   . Depression   . GERD (gastroesophageal reflux disease)   . Hyperlipidemia   . IBS (irritable bowel syndrome)   . Ischemic cardiomyopathy    Prior inferior infarct EF 35% by ECHO 07/13/11    . S/P CABG (coronary artery bypass graft)    CABG 06/1998 [place], [place] with LIMA to LAD, SVG to OM, SVG to PD-PL.   OM graft occluded 10/12/2005 by CTA   . Sleep apnea   . Ventricular tachycardia ([place]) 10/15   VT at 188 bpm- unstable requiring external defibrillation    Past Surgical History:  Procedure Laterality Date  . APPENDECTOMY    . CORONARY ARTERY BYPASS GRAFT  1999  . IMPLANTABLE CARDIOVERTER DEFIBRILLATOR IMPLANT N/A 07/31/2014   MDT Gwyneth Revels XT DR ICD implanted by Dr Rayann Heman for secondary treatent of VT  . LEFT HEART CATHETERIZATION WITH CORONARY/GRAFT ANGIOGRAM N/A 07/30/2014   Procedure: LEFT HEART CATHETERIZATION WITH Beatrix Fetters;  Surgeon: Jacolyn Reedy, MD;  Location: [facility] CATH LAB;  Service: Cardiovascular;  Laterality: N/A;    Current Outpatient Medications  Medication Sig Dispense Refill  . Ascorbic Acid (VITAMIN C) 1000 MG tablet Take 1,000 mg by mouth daily.    Marland Kitchen atorvastatin (LIPITOR) 80 MG tablet Take 1  tablet (80 mg total) by mouth daily. 90 tablet 3  . cetirizine (ZYRTEC) 10 MG tablet Take 10 mg by mouth daily.    . clopidogrel (PLAVIX) 75 MG tablet Take 75 mg by mouth daily.    . Coenzyme Q10 50 MG CAPS Take 1 capsule by mouth daily.    Marland Kitchen eplerenone (INSPRA) 25 MG tablet TAKE (1) TABLET BY MOUTH ONCE DAILY. 90 tablet 2  . fenofibrate 160 MG tablet Take 160 mg by mouth daily.    . fluticasone (FLONASE) 50 MCG/ACT nasal spray Place 2 sprays into both nostrils daily. 16 g 6  . folic acid (FOLVITE) 741 MCG tablet Take 800 mcg by mouth daily.     . furosemide (LASIX) 20 MG tablet Take 1 tablet (20 mg total) by  mouth daily as needed (WEIGHT GAIN). 90 tablet 3  . metoprolol succinate (TOPROL-XL) 50 MG 24 hr tablet Take 1 tablet (50 mg total) by mouth daily. Take with or immediately following a meal. 90 tablet 1  . Multiple Vitamin (MULTIVITAMIN WITH MINERALS) TABS tablet Take 1 tablet by mouth daily.    . nitroGLYCERIN (NITROSTAT) 0.4 MG SL tablet Place 1 tablet (0.4 mg total) under the tongue every 5 (five) minutes as needed for chest pain. 25 tablet 5  . Omega-3 Fatty Acids (FISH OIL) 1000 MG CAPS Take 1 capsule by mouth daily.    Marland Kitchen omeprazole (PRILOSEC) 20 MG capsule TAKE 1 CAPSULE TWICE DAILY BEFORE A MEAL. 180 capsule 0  . ramipril (ALTACE) 10 MG capsule Take 1 capsule (10 mg total) by mouth daily. 90 capsule 3  . sotalol (BETAPACE) 80 MG tablet Take 1 tablet (80 mg total) by mouth 2 (two) times daily. 60 tablet 4  . Vitamin E 400 UNITS TABS Take 400 Units by mouth daily.      No current facility-administered medications for this visit.     Allergies:   Carvedilol, Escitalopram oxalate, Niacin and related, and Spironolactone   Social History:  The patient  reports that he has never smoked. He has never used smokeless tobacco. He reports current alcohol use of about 2.0 standard drinks of alcohol per week. He reports that he does not use drugs.   Family History:  The patient's family history includes Cerebral aneurysm in his father; Hepatitis in his brother; Stroke in his maternal aunt.  ROS:  Please see the history of present illness.    All other systems are reviewed and otherwise negative.   PHYSICAL EXAM:  VS:  BP 122/78   Pulse 60   Ht 5\' 8"  (1.727 m)   Wt 198 lb (89.8 kg)   SpO2 96%   BMI 30.11 kg/m  BMI: Body mass index is 30.11 kg/m. Well nourished, well developed, in no acute distress  HEENT: normocephalic, atraumatic  Neck: no JVD, carotid bruits or masses Cardiac:  RRR; bradycardic, no significant murmurs, no rubs, or gallops Lungs:  CTA b/l, no wheezing, rhonchi or rales   Abd: soft, nontender MS: no deformity or atrophy Ext: no edema  Skin: warm and dry, no rash Neuro:  No gross deficits appreciated Psych: euthymic mood, full affect  ICD site is stable, no tethering or discomfort   EKG:  Done today and reviewed by myself shows  SB 50bpm some A paced beats, PR is 137ms, measured QT uis 55ms, QTc 456ms  ICD interrogation done today and reviewed by myself: battery and lead measurements are good.  AP 32.5%, VP 0.1% No arrhythmias, OptiVol is well  below threshold   03/28/2019: Lexiscan stress test  Nonspecific T wave abnormalities seen throughout study.  There was no ST segment deviation noted during stress.  Defect 1: There is a medium defect of mild severity present in the apical inferior and apical lateral location.  Defect 2: There is a large defect of severe severity present in the basal inferolateral and mid inferolateral location.  Findings consistent with prior myocardial infarction. No ischemic territories.  This is a high risk study based on degree of scar and severely depressed LVEF.  Nuclear stress EF: 26%.      03/28/2019 TTE IMPRESSIONS 1. Stage 1: 1: Basal inferolateral segment, mid anterolateral segment, mid inferolateral segment, and basal inferior segment are abnormal.  2. The left ventricle has moderate-severely reduced systolic function, with an ejection fraction of 30-35%. The cavity size was severely dilated. Left ventricular diastolic Doppler parameters are consistent with pseudonormalization.  3. Left atrial size was mild-moderately dilated.  4. The mitral valve is grossly normal.  5. The tricuspid valve is grossly normal.  6. The aortic valve is tricuspid. Mild thickening of the aortic valve. Mild calcification of the aortic valve.  7. There is mild dilatation of the aortic root measuring 37 mm.  FINDINGS  Left Ventricle: The left ventricle has moderate-severely reduced systolic function, with an ejection fraction of  30-35%. The cavity size was severely dilated. There is borderline increase in left ventricular wall thickness. Left ventricular diastolic  Doppler parameters are consistent with pseudonormalization. Normal left ventricular filling pressures  LV Wall Scoring: The basal inferolateral segment and mid anterolateral segment are akinetic. The mid inferolateral segment and basal inferior segment are hypokinetic. All remaining scored segments are normal.   Right Ventricle: The right ventricle has mildly reduced systolic function. The cavity was normal. There is no increase in right ventricular wall thickness. Pacing wire/catheter visualized in the right ventricle.  Left Atrium: Left atrial size was mild-moderately dilated.  Right Atrium: Right atrial size was normal in size. Right atrial pressure is estimated at 3 mmHg.  Interatrial Septum: No atrial level shunt detected by color flow Doppler.  Pericardium: There is no evidence of pericardial effusion.  Mitral Valve: The mitral valve is grossly normal. Mitral valve regurgitation is trivial by color flow Doppler.  Tricuspid Valve: The tricuspid valve is grossly normal. Tricuspid valve regurgitation is mild by color flow Doppler.  Aortic Valve: The aortic valve is tricuspid Mild thickening of the aortic valve. Mild calcification of the aortic valve. Aortic valve regurgitation was not visualized by color flow Doppler. There is no evidence of aortic valve stenosis.  Pulmonic Valve: The pulmonic valve was grossly normal. Pulmonic valve regurgitation is mild by color flow Doppler.  Aorta: There is mild dilatation of the aortic root measuring 37 mm.  Venous: The inferior vena cava is normal in size with greater than 50% respiratory variability.      Recent Labs: No results found for requested labs within last 8760 hours.  No results found for requested labs within last 8760 hours.   CrCl cannot be calculated (Patient's most recent  lab result is older than the maximum 21 days allowed.).   Wt Readings from Last 3 Encounters:  05/28/19 198 lb (89.8 kg)  12/20/18 191 lb 6.4 oz (86.8 kg)  12/19/18 190 lb (86.2 kg)     Other studies reviewed: Additional studies/records reviewed today include: summarized above  ASSESSMENT AND PLAN:  1. H/o VT     Off amio >> sotalol started in  March     QT is OK     Labs in May K+ 4.4, Creat 1.16 (calc CrCl using today's weight is 77)     Will check a mag  He is tolerating both sotalol and metoprolol without symptoms of bradycardia He does some A pacing no V pacing with good AV conduction will keep both, device is AAIR <> DDDR 50    2. ICD     intact function, no programming changes made  3. Chronic CHF (systolic) 4. ICM     No symptoms, exam findings to suggest fluid OL, OptiVol is good     Followed by L. Short      On BB , ACE, diuretic (prn) therapy  He mentions when he takes the lasix it makes him feel weak and very nauseous He is only taking 20mg  of lasix a couple times a week when he has been liberal with his diet, or "just incase". Will try HCTZ 12.5mg  PRN for weight gain, swelling Discussed diet importance of low salt, as well as daily weights, he follows with ICM clinic    5. CAD      No anginal symptoms      On BB, statin, plavix      C/w Dr. Bronson Ing and team   Disposition: He see s Dr. Ivonne Andrew next month, we will see him in 91mo, sooner if needed.  He will continue remotes as well.  Current medicines are reviewed at length with the patient today.  The patient did not have any concerns regarding medicines.  Venetia Night, PA-C 05/28/2019 2:40 PM     [place] [address] [place] [address] [phone] (office)  [phone] (fax)

## 2019-05-27 NOTE — Telephone Encounter (Signed)

## 2019-05-28 ENCOUNTER — Ambulatory Visit: Payer: PPO | Admitting: Physician Assistant

## 2019-05-28 ENCOUNTER — Other Ambulatory Visit: Payer: Self-pay

## 2019-05-28 VITALS — BP 122/78 | HR 60 | Ht 68.0 in | Wt 198.0 lb

## 2019-05-28 DIAGNOSIS — I472 Ventricular tachycardia, unspecified: Secondary | ICD-10-CM

## 2019-05-28 DIAGNOSIS — I2510 Atherosclerotic heart disease of native coronary artery without angina pectoris: Secondary | ICD-10-CM | POA: Diagnosis not present

## 2019-05-28 DIAGNOSIS — Z95810 Presence of automatic (implantable) cardiac defibrillator: Secondary | ICD-10-CM | POA: Diagnosis not present

## 2019-05-28 DIAGNOSIS — I5022 Chronic systolic (congestive) heart failure: Secondary | ICD-10-CM

## 2019-05-28 MED ORDER — HYDROCHLOROTHIAZIDE 12.5 MG PO CAPS: 12.5000 mg | ORAL_CAPSULE | ORAL | 1 refills | Status: DC | PRN

## 2019-05-28 NOTE — Patient Instructions (Addendum)
Medication Instructions:   Stop taking Furosemide  Start taking HCTZ 12.5 mg daily as needed to for weight gain  or swelling    If you need a refill on your cardiac medications before your next appointment, please call your pharmacy.   Lab work: [place]   If you have labs (blood work) drawn today and your tests are completely normal, you will receive your results only by: Marland Kitchen MyChart Message (if you have MyChart) OR . A paper copy in the mail If you have any lab test that is abnormal or we need to change your treatment, we will call you to review the results.  Testing/Procedures: NONE ORDERED  TODAY  Follow-Up: At [facility], you and your health needs are our priority.  As part of our continuing mission to provide you with exceptional heart care, we have created designated Provider Care Teams.  These Care Teams include your primary Cardiologist (physician) and Advanced Practice Providers (APPs -  Physician Assistants and Nurse Practitioners) who all work together to provide you with the care you need, when you need it. You will need a follow up appointment in 3 months.  Please call our office 2 months in advance to schedule this appointment.  You may see  Dr Rayann Heman  or one of the following Advanced Practice Providers on your designated Care Team:   [name], NP . Tommye Standard, PA-C  Any Other Special Instructions Will Be Listed Below (If Applicable).

## 2019-05-29 LAB — MAGNESIUM: Magnesium: 1.6 mg/dL (ref 1.6–2.3)

## 2019-05-30 ENCOUNTER — Other Ambulatory Visit: Payer: Self-pay | Admitting: *Deleted

## 2019-05-30 MED ORDER — MAGNESIUM OXIDE 400 MG PO CAPS: 400.0000 mg | ORAL_CAPSULE | Freq: Every day | ORAL | 1 refills | Status: DC

## 2019-06-10 ENCOUNTER — Ambulatory Visit (INDEPENDENT_AMBULATORY_CARE_PROVIDER_SITE_OTHER): Payer: PPO

## 2019-06-10 DIAGNOSIS — Z95810 Presence of automatic (implantable) cardiac defibrillator: Secondary | ICD-10-CM | POA: Diagnosis not present

## 2019-06-10 DIAGNOSIS — I5022 Chronic systolic (congestive) heart failure: Secondary | ICD-10-CM

## 2019-06-10 DIAGNOSIS — D649 Anemia, unspecified: Secondary | ICD-10-CM | POA: Diagnosis not present

## 2019-06-10 DIAGNOSIS — E0590 Thyrotoxicosis, unspecified without thyrotoxic crisis or storm: Secondary | ICD-10-CM | POA: Diagnosis not present

## 2019-06-12 NOTE — Progress Notes (Signed)
EPIC Encounter for ICM Monitoring  Patient Name: Paul [name] is a 68 y.o. male Date: 06/12/2019 Primary Care Physican: Chesley Noon, MD Primary [place] Electrophysiologist: Allred 04/03/2019 Weight: 186 - 189 lbs   Spoke with patient and he is feeling fine.  He received good report at [place] with Tommye Standard, PA.    Optivol thoracic impedancenormal.  Prescribed:Hydrochlorothiazide 12.5 mg Take 1 capsule (12.5 mg total) by mouth as needed (weight gain 3lbs or more in 24 hours 5lbs in a week or swelling).  Labs: 11/12/2018 Creatinine 1.15, BUN 14, Potassium 4.3, Sodium 140, GFR 65-76  Recommendations:No changes and encouraged to call if experiencing any fluid symptoms.  Follow-up plan: ICM clinic phone appointment on10/16/2020.  Copy of ICM check sent to Dr.Allred   3 month ICM trend: 06/12/2019    1 Year ICM trend:       [name], RN 06/12/2019 3:18 PM

## 2019-06-14 DIAGNOSIS — J309 Allergic rhinitis, unspecified: Secondary | ICD-10-CM | POA: Diagnosis not present

## 2019-06-14 DIAGNOSIS — H1045 Other chronic allergic conjunctivitis: Secondary | ICD-10-CM | POA: Diagnosis not present

## 2019-06-14 DIAGNOSIS — J300 Vasomotor rhinitis: Secondary | ICD-10-CM | POA: Diagnosis not present

## 2019-06-18 DIAGNOSIS — E0590 Thyrotoxicosis, unspecified without thyrotoxic crisis or storm: Secondary | ICD-10-CM | POA: Diagnosis not present

## 2019-06-18 DIAGNOSIS — F5101 Primary insomnia: Secondary | ICD-10-CM | POA: Diagnosis not present

## 2019-06-18 DIAGNOSIS — K219 Gastro-esophageal reflux disease without esophagitis: Secondary | ICD-10-CM | POA: Diagnosis not present

## 2019-06-18 DIAGNOSIS — I10 Essential (primary) hypertension: Secondary | ICD-10-CM | POA: Diagnosis not present

## 2019-06-18 DIAGNOSIS — R7989 Other specified abnormal findings of blood chemistry: Secondary | ICD-10-CM | POA: Diagnosis not present

## 2019-06-18 DIAGNOSIS — Z95810 Presence of automatic (implantable) cardiac defibrillator: Secondary | ICD-10-CM | POA: Diagnosis not present

## 2019-06-18 DIAGNOSIS — J301 Allergic rhinitis due to pollen: Secondary | ICD-10-CM | POA: Diagnosis not present

## 2019-06-18 DIAGNOSIS — E538 Deficiency of other specified B group vitamins: Secondary | ICD-10-CM | POA: Diagnosis not present

## 2019-06-19 ENCOUNTER — Other Ambulatory Visit: Payer: Self-pay | Admitting: Internal Medicine

## 2019-07-17 ENCOUNTER — Encounter: Payer: Self-pay | Admitting: Cardiovascular Disease

## 2019-07-17 ENCOUNTER — Other Ambulatory Visit: Payer: Self-pay

## 2019-07-17 ENCOUNTER — Ambulatory Visit (INDEPENDENT_AMBULATORY_CARE_PROVIDER_SITE_OTHER): Payer: PPO | Admitting: Cardiovascular Disease

## 2019-07-17 VITALS — BP 129/75 | HR 58 | Temp 97.5°F | Ht 68.0 in | Wt 196.0 lb

## 2019-07-17 DIAGNOSIS — Z23 Encounter for immunization: Secondary | ICD-10-CM

## 2019-07-17 DIAGNOSIS — I5022 Chronic systolic (congestive) heart failure: Secondary | ICD-10-CM

## 2019-07-17 DIAGNOSIS — Z951 Presence of aortocoronary bypass graft: Secondary | ICD-10-CM | POA: Diagnosis not present

## 2019-07-17 DIAGNOSIS — E785 Hyperlipidemia, unspecified: Secondary | ICD-10-CM | POA: Diagnosis not present

## 2019-07-17 DIAGNOSIS — Z95810 Presence of automatic (implantable) cardiac defibrillator: Secondary | ICD-10-CM | POA: Diagnosis not present

## 2019-07-17 DIAGNOSIS — I472 Ventricular tachycardia, unspecified: Secondary | ICD-10-CM

## 2019-07-17 DIAGNOSIS — I25708 Atherosclerosis of coronary artery bypass graft(s), unspecified, with other forms of angina pectoris: Secondary | ICD-10-CM | POA: Diagnosis not present

## 2019-07-17 NOTE — Progress Notes (Signed)
SUBJECTIVE: The patient presents to establish care with me in [place].  He was previously seen by Dr. Acie Fredrickson and Dr. Wynonia Lawman.  He also follows with Dr. Rayann Heman.  He has a history of coronary artery disease and CABG, chronic systolic heart failure, ventricular tachycardia, and has a defibrillator.  He initially underwent CABG in 1989.  His last cardiac catheterization in 2015 showed patent LIMA to LAD and 3 patent saphenous vein grafts.  Echocardiogram on 03/28/2019 demonstrated severely reduced left ventricular systolic function, LVEF 30 to 35%, wall motion abnormalities, severe left ventricular dilatation, mild to moderate left atrial dilatation.  Nuclear stress test on 03/28/2019 demonstrated prior myocardial infarction with no ischemic territories.  Calculated LVEF 26%.  The patient denies any symptoms of chest pain, palpitations, shortness of breath, lightheadedness, dizziness, leg swelling, orthopnea, PND, and syncope.  He requests a flu shot.   Social history: His wife, Neoma Laming, is also my patient.  She underwent an atrial fibrillation ablation by Dr. Rayann Heman.  Review of Systems: As per "subjective", otherwise negative.  Allergies  Allergen Reactions  . Carvedilol     Fatigue   . Escitalopram Oxalate     unknown  . Niacin And Related     Fl[place]   . Spironolactone     gynecomastia    Current Outpatient Medications  Medication Sig Dispense Refill  . Ascorbic Acid (VITAMIN C) 1000 MG tablet Take 1,000 mg by mouth daily.    Marland Kitchen atorvastatin (LIPITOR) 80 MG tablet Take 1 tablet (80 mg total) by mouth daily. 90 tablet 3  . clopidogrel (PLAVIX) 75 MG tablet Take 75 mg by mouth daily.    . Coenzyme Q10 50 MG CAPS Take 1 capsule by mouth daily.    . fenofibrate 160 MG tablet Take 160 mg by mouth daily.    . folic acid (FOLVITE) [id number] MCG tablet Take 800 mcg by mouth daily.     . hydrochlorothiazide (MICROZIDE) 12.5 MG capsule Take 1 capsule (12.5 mg total) by mouth as needed  (weight gain 3lbs or more in 24 hours 5lbs in a week or swelling). 30 capsule 1  . Melatonin 5 MG TABS Take by mouth.    . metoprolol succinate (TOPROL-XL) 50 MG 24 hr tablet Take 1 tablet (50 mg total) by mouth daily. Take with or immediately following a meal. 90 tablet 1  . nitroGLYCERIN (NITROSTAT) 0.4 MG SL tablet Place 1 tablet (0.4 mg total) under the tongue every 5 (five) minutes as needed for chest pain. 25 tablet 5  . Omega-3 Fatty Acids (FISH OIL) 1000 MG CAPS Take 1 capsule by mouth daily.    Marland Kitchen omeprazole (PRILOSEC) 20 MG capsule TAKE 1 CAPSULE TWICE DAILY BEFORE A MEAL. 180 capsule 0  . ramipril (ALTACE) 10 MG capsule Take 1 capsule (10 mg total) by mouth daily. 90 capsule 3  . sildenafil (VIAGRA) 100 MG tablet Take 100 mg by mouth daily as needed for erectile dysfunction.    . sotalol (BETAPACE) 80 MG tablet TAKE (1) TABLET BY MOUTH TWICE DAILY. 60 tablet 6  . Vitamin D, Cholecalciferol, 25 MCG (1000 UT) CAPS Take by mouth.    . Vitamin E 400 UNITS TABS Take 400 Units by mouth daily.     Marland Kitchen azelastine (OPTIVAR) 0.05 % ophthalmic solution     . cetirizine (ZYRTEC) 10 MG tablet Take 10 mg by mouth daily.    Marland Kitchen eplerenone (INSPRA) 25 MG tablet TAKE (1) TABLET BY MOUTH ONCE DAILY. [place]  tablet 2  . fluticasone (FLONASE) 50 MCG/ACT nasal spray Place 2 sprays into both nostrils daily. 16 g 6  . Magnesium Oxide 400 MG CAPS Take 1 capsule (400 mg total) by mouth daily. 90 capsule 1   No current facility-administered medications for this visit.     Past Medical History:  Diagnosis Date  . BPH (benign prostatic hyperplasia)   . CAD (coronary artery disease)    widely patent LAD LIMA graft, widely patent PDA PLR SVG, occluded Diag 1 OM 1 SVG by cardiac CT 2007   . CHF (congestive heart failure) ([place])   . Depression   . GERD (gastroesophageal reflux disease)   . Hyperlipidemia   . IBS (irritable bowel syndrome)   . Ischemic cardiomyopathy    Prior inferior infarct EF 35% by ECHO 07/13/11     . S/P CABG (coronary artery bypass graft)    CABG 06/1998 [place], [place] with LIMA to LAD, SVG to OM, SVG to PD-PL.   OM graft occluded 10/12/2005 by CTA   . Sleep apnea   . Ventricular tachycardia ([place]) 10/15   VT at 188 bpm- unstable requiring external defibrillation    Past Surgical History:  Procedure Laterality Date  . APPENDECTOMY    . CORONARY ARTERY BYPASS GRAFT  1999  . IMPLANTABLE CARDIOVERTER DEFIBRILLATOR IMPLANT N/A 07/31/2014   MDT Gwyneth Revels XT DR ICD implanted by Dr Rayann Heman for secondary treatent of VT  . LEFT HEART CATHETERIZATION WITH CORONARY/GRAFT ANGIOGRAM N/A 07/30/2014   Procedure: LEFT HEART CATHETERIZATION WITH Beatrix Fetters;  Surgeon: Jacolyn Reedy, MD;  Location: [facility] CATH LAB;  Service: Cardiovascular;  Laterality: N/A;    Social History   Socioeconomic History  . Marital status: Married    Spouse name: Not on file  . Number of children: Not on file  . Years of education: Not on file  . Highest education level: Not on file  Occupational History  . Not on file  Social Needs  . Financial resource strain: Not on file  . Food insecurity    Worry: Not on file    Inability: Not on file  . Transportation needs    Medical: Not on file    Non-medical: Not on file  Tobacco Use  . Smoking status: Never Smoker  . Smokeless tobacco: Never Used  Substance and Sexual Activity  . Alcohol use: Yes    Alcohol/week: 2.0 standard drinks    Types: 2 Glasses of wine per week  . Drug use: No  . Sexual activity: Not on file  Lifestyle  . Physical activity    Days per week: Not on file    Minutes per session: Not on file  . Stress: Not on file  Relationships  . Social Herbalist on phone: Not on file    Gets together: Not on file    Attends religious service: Not on file    Active member of club or organization: Not on file    Attends meetings of clubs or organizations: Not on file    Relationship status: Not on file  . Intimate partner violence     Fear of current or ex partner: Not on file    Emotionally abused: Not on file    Physically abused: Not on file    Forced sexual activity: Not on file  Other Topics Concern  . Not on file  Social History Narrative  . Not on file     Vitals:   07/17/19 1349  BP: 129/75  Pulse: (!) 58  Temp: (!) 97.5 F (36.4 C)  Height: 5\' 8"  (1.727 m)    Wt Readings from Last 3 Encounters:  05/28/19 198 lb (89.8 kg)  12/20/18 191 lb 6.4 oz (86.8 kg)  12/19/18 190 lb (86.2 kg)     PHYSICAL EXAM General: NAD HEENT: Normal. Neck: No JVD, no thyromegaly. Lungs: Clear to auscultation bilaterally with normal respiratory effort. CV: Regular rate and rhythm, normal S1/S2, no S3/S4, no murmur. No pretibial or periankle edema.  No carotid bruit.   Abdomen: Soft, nontender, no distention.  Neurologic: Alert and oriented.  Psych: Normal affect. Skin: Normal. Musculoskeletal: No gross deformities.      Labs: Lab Results  Component Value Date/Time   K 4.3 05/10/2017 01:27 PM   BUN 11 05/10/2017 01:27 PM   BUN 15 01/08/2016   CREATININE 1.15 05/10/2017 01:27 PM   ALT 25 05/10/2017 01:27 PM   TSH 1.57 04/18/2017 11:40 AM   HGB 15.2 04/18/2017 11:40 AM     Lipids: Lab Results  Component Value Date/Time   LDLCALC 51 11/11/2016 03:13 PM   CHOL 128 11/11/2016 03:13 PM   TRIG 80.0 11/11/2016 03:13 PM   HDL 61.00 11/11/2016 03:13 PM       ASSESSMENT AND PLAN: 1.  Coronary artery disease: Nuclear stress test in June 2020 showed no ischemic territories.  Last cardiac catheterization in 2015 showed patent LIMA to the LAD and 3 patent saphenous vein grafts.  He is on Plavix, beta-blocker, and statin therapy.  We will administer a flu shot as per his request.  2.  Hyperlipidemia: Continue atorvastatin 80 mg.  LDL 55 on 10/22/2018.  3.  Ventricular tachycardia: He has a defibrillator.  He is now on sotalol.  He is no longer on amiodarone.  He follows with Dr. Rayann Heman.  Magnesium low  normal at 1.6 on 05/28/2019.  He was started by EP on magnesium oxide but this aggravated his irritable bowel syndrome and he had to stop it.  4.  Chronic systolic heart failure: LVEF 30 to 35% by echocardiogram in June 2020.  He is on metoprolol succinate, eplerenone, and ramipril.  He is on a thiazide diuretic.    Disposition: Follow up 6 months with me   Kate Sable, M.D., F.A.C.C.

## 2019-07-17 NOTE — Patient Instructions (Signed)

## 2019-08-08 ENCOUNTER — Ambulatory Visit (INDEPENDENT_AMBULATORY_CARE_PROVIDER_SITE_OTHER): Payer: PPO | Admitting: *Deleted

## 2019-08-08 DIAGNOSIS — I472 Ventricular tachycardia, unspecified: Secondary | ICD-10-CM

## 2019-08-08 DIAGNOSIS — I255 Ischemic cardiomyopathy: Secondary | ICD-10-CM

## 2019-08-08 LAB — CUP PACEART REMOTE DEVICE CHECK
Battery Remaining Longevity: 46 mo
Battery Voltage: 2.98 V
Brady Statistic AP VP Percent: 0.01 %
Brady Statistic AP VS Percent: 11.43 %
Brady Statistic AS VP Percent: 0.04 %
Brady Statistic AS VS Percent: 88.53 %
Brady Statistic RA Percent Paced: 11.24 %
Brady Statistic RV Percent Paced: 0.04 %
Date Time Interrogation Session: 20201015041607
HighPow Impedance: 80 Ohm
Implantable Lead Implant Date: 20151008
Implantable Lead Implant Date: 20151008
Implantable Lead Location: 753859
Implantable Lead Location: 753860
Implantable Lead Model: 5076
Implantable Lead Model: 6935
Implantable Pulse Generator Implant Date: 20151008
Lead Channel Impedance Value: 304 Ohm
Lead Channel Impedance Value: 361 Ohm
Lead Channel Impedance Value: 475 Ohm
Lead Channel Pacing Threshold Amplitude: 1 V
Lead Channel Pacing Threshold Amplitude: 1 V
Lead Channel Pacing Threshold Pulse Width: 0.4 ms
Lead Channel Pacing Threshold Pulse Width: 0.4 ms
Lead Channel Sensing Intrinsic Amplitude: 1.75 mV
Lead Channel Sensing Intrinsic Amplitude: 6 mV
Lead Channel Setting Pacing Amplitude: 2.25 V
Lead Channel Setting Pacing Amplitude: 2.5 V
Lead Channel Setting Pacing Pulse Width: 0.4 ms
Lead Channel Setting Sensing Sensitivity: 0.3 mV

## 2019-08-09 ENCOUNTER — Ambulatory Visit (INDEPENDENT_AMBULATORY_CARE_PROVIDER_SITE_OTHER): Payer: PPO

## 2019-08-09 DIAGNOSIS — Z95810 Presence of automatic (implantable) cardiac defibrillator: Secondary | ICD-10-CM

## 2019-08-09 DIAGNOSIS — I5022 Chronic systolic (congestive) heart failure: Secondary | ICD-10-CM

## 2019-08-09 NOTE — Progress Notes (Signed)
EPIC Encounter for ICM Monitoring  Patient Name: Michaelanthony Raysor is a 68 y.o. male Date: 08/09/2019 Primary Care Physican: Chesley Noon, MD Primary [place] Electrophysiologist: Allred 07/17/2019 Weight: 186 - 189 lbs   Spoke with patient and he is feeling fine.Dr Rayann Heman did ablation on his wife and she is doing great.    Optivol thoracic impedancenormal.  Prescribed:Hydrochlorothiazide 12.5 mg Take 1 capsule (12.5 mg total) by mouth as needed (weight gain 3lbs or more in 24 hours 5lbs in a week or swelling).  Labs: 11/12/2018 Creatinine 1.15, BUN 14, Potassium 4.3, Sodium 140, GFR 65-76  Recommendations:No changes and encouraged to call if experiencing any fluid symptoms.  Follow-up plan: ICM clinic phone appointment on 09/30/2019.   91 day device clinic remote transmission 11/06/2018.  Office appt 11/5/202 with Dr. Rayann Heman.    Copy of ICM check sent to Dr. Rayann Heman.   3 month ICM trend: 08/08/2019    1 Year ICM trend:       [name], RN 08/09/2019 5:14 PM

## 2019-08-22 NOTE — Progress Notes (Signed)
Remote ICD transmission.   

## 2019-08-23 ENCOUNTER — Encounter: Payer: Self-pay | Admitting: Hematology

## 2019-08-23 DIAGNOSIS — E0590 Thyrotoxicosis, unspecified without thyrotoxic crisis or storm: Secondary | ICD-10-CM | POA: Diagnosis not present

## 2019-08-23 DIAGNOSIS — D649 Anemia, unspecified: Secondary | ICD-10-CM | POA: Diagnosis not present

## 2019-08-29 ENCOUNTER — Ambulatory Visit: Payer: PPO | Admitting: Internal Medicine

## 2019-08-29 ENCOUNTER — Other Ambulatory Visit: Payer: Self-pay

## 2019-08-29 ENCOUNTER — Encounter: Payer: PPO | Admitting: Internal Medicine

## 2019-08-29 ENCOUNTER — Encounter: Payer: Self-pay | Admitting: Internal Medicine

## 2019-08-29 VITALS — BP 102/68 | HR 70 | Ht 68.0 in | Wt 196.0 lb

## 2019-08-29 DIAGNOSIS — I5022 Chronic systolic (congestive) heart failure: Secondary | ICD-10-CM | POA: Diagnosis not present

## 2019-08-29 DIAGNOSIS — Z95810 Presence of automatic (implantable) cardiac defibrillator: Secondary | ICD-10-CM | POA: Diagnosis not present

## 2019-08-29 DIAGNOSIS — I10 Essential (primary) hypertension: Secondary | ICD-10-CM | POA: Diagnosis not present

## 2019-08-29 DIAGNOSIS — I255 Ischemic cardiomyopathy: Secondary | ICD-10-CM | POA: Diagnosis not present

## 2019-08-29 DIAGNOSIS — I472 Ventricular tachycardia, unspecified: Secondary | ICD-10-CM

## 2019-08-29 DIAGNOSIS — E0590 Thyrotoxicosis, unspecified without thyrotoxic crisis or storm: Secondary | ICD-10-CM | POA: Diagnosis not present

## 2019-08-29 DIAGNOSIS — E041 Nontoxic single thyroid nodule: Secondary | ICD-10-CM | POA: Diagnosis not present

## 2019-08-29 LAB — CUP PACEART INCLINIC DEVICE CHECK
Battery Remaining Longevity: 46 mo
Battery Voltage: 2.95 V
Brady Statistic AP VP Percent: 0.01 %
Brady Statistic AP VS Percent: 12.54 %
Brady Statistic AS VP Percent: 0.03 %
Brady Statistic AS VS Percent: 87.41 %
Brady Statistic RA Percent Paced: 12.28 %
Brady Statistic RV Percent Paced: 0.05 %
Date Time Interrogation Session: 20201105192744
HighPow Impedance: 73 Ohm
Implantable Lead Implant Date: 20151008
Implantable Lead Implant Date: 20151008
Implantable Lead Location: 753859
Implantable Lead Location: 753860
Implantable Lead Model: 5076
Implantable Lead Model: 6935
Implantable Pulse Generator Implant Date: 20151008
Lead Channel Impedance Value: 285 Ohm
Lead Channel Impedance Value: 342 Ohm
Lead Channel Impedance Value: 456 Ohm
Lead Channel Pacing Threshold Amplitude: 1 V
Lead Channel Pacing Threshold Amplitude: 1 V
Lead Channel Pacing Threshold Pulse Width: 0.4 ms
Lead Channel Pacing Threshold Pulse Width: 0.4 ms
Lead Channel Sensing Intrinsic Amplitude: 2 mV
Lead Channel Sensing Intrinsic Amplitude: 7.625 mV
Lead Channel Setting Pacing Amplitude: 2 V
Lead Channel Setting Pacing Amplitude: 2.5 V
Lead Channel Setting Pacing Pulse Width: 0.4 ms
Lead Channel Setting Sensing Sensitivity: 0.3 mV

## 2019-08-29 NOTE — Patient Instructions (Addendum)
Medication Instructions:  Your physician recommends that you continue on your current medications as directed. Please refer to the Current Medication list given to you today.  Labwork: You will get lab work today:  BMP and magnesium  Testing/Procedures: None ordered.  Follow-Up: Your physician wants you to follow-up in: 6 months with [name], NP.   You will receive a reminder letter in the mail two months in advance. If you don't receive a letter, please call our office to schedule the follow-up appointment.  Remote monitoring is used to monitor your ICD from home. This monitoring reduces the number of office visits required to check your device to one time per year. It allows [place] to keep an eye on the functioning of your device to ensure it is working properly. You are scheduled for a device check from home on 09/30/2019. You may send your transmission at any time that day. If you have a wireless device, the transmission will be sent automatically. After your physician reviews your transmission, you will receive a postcard with your next transmission date.  Any Other Special Instructions Will Be Listed Below (If Applicable).  If you need a refill on your cardiac medications before your next appointment, please call your pharmacy.

## 2019-08-29 NOTE — Progress Notes (Signed)
PCP: Chesley Noon, MD Primary Cardiologist: Dr Bronson Ing Primary EP: Dr Rayann Heman  Paul [name] Drex Piersol is a 68 y.o. male who presents today for routine electrophysiology followup.  Since last being seen in our clinic, the patient reports doing very well.  No arrhythmias. Today, he denies symptoms of palpitations, chest pain, shortness of breath,  lower extremity edema, dizziness, presyncope, syncope, or ICD shocks.  The patient is otherwise without complaint today.   Past Medical History:  Diagnosis Date  . BPH (benign prostatic hyperplasia)   . CAD (coronary artery disease)    widely patent LAD LIMA graft, widely patent PDA PLR SVG, occluded Diag 1 OM 1 SVG by cardiac CT 2007   . CHF (congestive heart failure) ([place])   . Depression   . GERD (gastroesophageal reflux disease)   . Hyperlipidemia   . IBS (irritable bowel syndrome)   . Ischemic cardiomyopathy    Prior inferior infarct EF 35% by ECHO 07/13/11    . S/P CABG (coronary artery bypass graft)    CABG 06/1998 [place], [place] with LIMA to LAD, SVG to OM, SVG to PD-PL.   OM graft occluded 10/12/2005 by CTA   . Sleep apnea   . Ventricular tachycardia ([place]) 10/15   VT at 188 bpm- unstable requiring external defibrillation   Past Surgical History:  Procedure Laterality Date  . APPENDECTOMY    . CORONARY ARTERY BYPASS GRAFT  1999  . IMPLANTABLE CARDIOVERTER DEFIBRILLATOR IMPLANT N/A 07/31/2014   MDT Gwyneth Revels XT DR ICD implanted by Dr Rayann Heman for secondary treatent of VT  . LEFT HEART CATHETERIZATION WITH CORONARY/GRAFT ANGIOGRAM N/A 07/30/2014   Procedure: LEFT HEART CATHETERIZATION WITH Beatrix Fetters;  Surgeon: Jacolyn Reedy, MD;  Location[facility] CATH LAB;  Service: Cardiovascular;  Laterality: N/A;    ROS- all systems are reviewed and negative except as per HPI above  Current Outpatient Medications  Medication Sig Dispense Refill  . Ascorbic Acid (VITAMIN C) 1000 MG tablet Take 1,000 mg by mouth daily.    Marland Kitchen atorvastatin  (LIPITOR) 80 MG tablet Take 1 tablet (80 mg total) by mouth daily. 90 tablet 3  . azelastine (OPTIVAR) 0.05 % ophthalmic solution     . cetirizine (ZYRTEC) 10 MG tablet Take 10 mg by mouth daily.    . clopidogrel (PLAVIX) 75 MG tablet Take 75 mg by mouth daily.    . Coenzyme Q10 50 MG CAPS Take 1 capsule by mouth daily.    Marland Kitchen eplerenone (INSPRA) 25 MG tablet TAKE (1) TABLET BY MOUTH ONCE DAILY. 90 tablet 2  . fenofibrate 160 MG tablet Take 160 mg by mouth daily.    . fluticasone (FLONASE) 50 MCG/ACT nasal spray Place 2 sprays into both nostrils daily. 16 g 6  . folic acid (FOLVITE) [id number] MCG tablet Take 800 mcg by mouth daily.     . Magnesium Oxide 400 MG CAPS Take 1 capsule (400 mg total) by mouth daily. 90 capsule 1  . Melatonin 5 MG TABS Take by mouth.    . metoprolol succinate (TOPROL-XL) 50 MG 24 hr tablet Take 1 tablet (50 mg total) by mouth daily. Take with or immediately following a meal. 90 tablet 1  . nitroGLYCERIN (NITROSTAT) 0.4 MG SL tablet Place 1 tablet (0.4 mg total) under the tongue every 5 (five) minutes as needed for chest pain. 25 tablet 5  . Omega-3 Fatty Acids (FISH OIL) 1000 MG CAPS Take 1 capsule by mouth daily.    Marland Kitchen omeprazole (PRILOSEC) 20 MG  capsule TAKE 1 CAPSULE TWICE DAILY BEFORE A MEAL. 180 capsule 0  . ramipril (ALTACE) 10 MG capsule Take 1 capsule (10 mg total) by mouth daily. 90 capsule 3  . sildenafil (VIAGRA) 100 MG tablet Take 100 mg by mouth daily as needed for erectile dysfunction.    . sotalol (BETAPACE) 80 MG tablet TAKE (1) TABLET BY MOUTH TWICE DAILY. 60 tablet 6  . Vitamin D, Cholecalciferol, 25 MCG (1000 UT) CAPS Take by mouth.    . Vitamin E 400 UNITS TABS Take 400 Units by mouth daily.     . hydrochlorothiazide (MICROZIDE) 12.5 MG capsule Take 1 capsule (12.5 mg total) by mouth as needed (weight gain 3lbs or more in 24 hours 5lbs in a week or swelling). 30 capsule 1   No current facility-administered medications for this visit.     Physical Exam:  Vitals:   08/29/19 1544  BP: 102/68  Pulse: 70  SpO2: 98%  Weight: 196 lb (88.9 kg)  Height: 5\' 8"  (1.727 m)    GEN- The patient is well appearing, alert and oriented x 3 today.   Head- normocephalic, atraumatic Eyes-  Sclera clear, conjunctiva pink Ears- hearing intact Oropharynx- clear Lungs- Clear to ausculation bilaterally, normal work of breathing Chest- ICD pocket is well healed Heart- Regular rate and rhythm, no murmurs, rubs or gallops, PMI not laterally displaced GI- soft, NT, ND, + BS Extremities- no clubbing, cyanosis, or edema  ICD interrogation- reviewed in detail today,  See PACEART report  ekg tracing ordered today is personally reviewed and shows atrial paced rhythm 70 bpm, PR 252 msec, QRS 124 msec, QTc 479 msec, poor R wave progression, PVC  Wt Readings from Last 3 Encounters:  08/29/19 196 lb (88.9 kg)  07/17/19 196 lb (88.9 kg)  05/28/19 198 lb (89.8 kg)    Assessment and Plan:  1.  Chronic systolic dysfunction/ CAD/ ischemic CM euvolemic today Stable on an appropriate medical regimen Normal ICD function See [facility] report No changes today he is not device dependant today followed in ICM device clinic  2. VT Well controlled with sotalol QT is stable Repeat bmet, mg today Thyroid is improved off amiodarone He could not tolerate oral magnesium  Hopefully his Mg is ok.  I worry that hctz and omeprazole may be the cause.  3. OSA Uses dental device  Return to see EP NP in 6 months  Thompson Grayer MD, [facility] 08/29/2019 3:58 PM

## 2019-08-30 ENCOUNTER — Other Ambulatory Visit: Payer: Self-pay

## 2019-08-30 DIAGNOSIS — L570 Actinic keratosis: Secondary | ICD-10-CM | POA: Diagnosis not present

## 2019-08-30 DIAGNOSIS — D044 Carcinoma in situ of skin of scalp and neck: Secondary | ICD-10-CM | POA: Diagnosis not present

## 2019-08-30 DIAGNOSIS — D485 Neoplasm of uncertain behavior of skin: Secondary | ICD-10-CM | POA: Diagnosis not present

## 2019-08-30 DIAGNOSIS — L820 Inflamed seborrheic keratosis: Secondary | ICD-10-CM | POA: Diagnosis not present

## 2019-08-30 LAB — BASIC METABOLIC PANEL
BUN/Creatinine Ratio: 8 — ABNORMAL LOW (ref 10–24)
BUN: 9 mg/dL (ref 8–27)
CO2: 24 mmol/L (ref 20–29)
Calcium: 9.4 mg/dL (ref 8.6–10.2)
Chloride: 103 mmol/L (ref 96–106)
Creatinine, Ser: 1.16 mg/dL (ref 0.76–1.27)
GFR calc Af Amer: 74 mL/min/{1.73_m2} (ref >59)
GFR calc non Af Amer: 64 mL/min/{1.73_m2} (ref >59)
Glucose: 91 mg/dL (ref 65–99)
Potassium: 4 mmol/L (ref 3.5–5.2)
Sodium: 141 mmol/L (ref 134–144)

## 2019-08-30 LAB — MAGNESIUM: Magnesium: 1.5 mg/dL — ABNORMAL LOW (ref 1.6–2.3)

## 2019-09-03 ENCOUNTER — Telehealth: Payer: Self-pay

## 2019-09-03 MED ORDER — SILDENAFIL CITRATE 50 MG PO TABS: 50.0000 mg | ORAL_TABLET | Freq: Every day | ORAL | 0 refills | Status: DC | PRN

## 2019-09-03 MED ORDER — CLOPIDOGREL BISULFATE 75 MG PO TABS: 75.0000 mg | ORAL_TABLET | Freq: Every day | ORAL | 3 refills | Status: DC

## 2019-09-03 NOTE — Addendum Note (Signed)
Addended by: Debbora Lacrosse R on: 09/03/2019 10:36 AM   Modules accepted: Orders

## 2019-09-03 NOTE — Telephone Encounter (Signed)
Medications sent to pharmacy

## 2019-09-03 NOTE — Telephone Encounter (Signed)
New message     *STAT* If patient is at the pharmacy, call can be transferred to refill team.   1. Which medications need to be refilled? (please list name of each medication and dose if known)  clopidogrel (PLAVIX) 75 MG tablet Take 75 mg by mouth daily.     2. Which pharmacy/location (including street and city if local pharmacy) is medication to be sent to [place]   3. Do they need a 30 day or 90 day supply? 90     *STAT* If patient is at the pharmacy, call can be transferred to refill team.   1. Which medications need to be refilled? (please list name of each medication and dose if known)  sildenafil (VIAGRA) 100 MG tablet Take 100 mg by mouth daily as needed for erectile dysfunction.     2. Which pharmacy/location (including street and city if local pharmacy) is medication to be sent to? [facility] in [place] (cheaper at this pharmacy)  3. Do they need a 30 day or 90 day supply? He wants only 20 pills of the 50mg  instead of the 100 mg

## 2019-09-11 MED ORDER — METOPROLOL SUCCINATE ER 50 MG PO TB24: 50.0000 mg | ORAL_TABLET | Freq: Every day | ORAL | 3 refills | Status: DC

## 2019-09-11 NOTE — Telephone Encounter (Signed)
Pt's medication was sent to pt's pharmacy as requested. Confirmation received.  °

## 2019-09-16 ENCOUNTER — Other Ambulatory Visit: Payer: Self-pay

## 2019-09-16 ENCOUNTER — Inpatient Hospital Stay (HOSPITAL_COMMUNITY): Payer: PPO | Attending: Hematology | Admitting: Hematology

## 2019-09-16 ENCOUNTER — Inpatient Hospital Stay (HOSPITAL_COMMUNITY): Payer: PPO

## 2019-09-16 ENCOUNTER — Encounter (HOSPITAL_COMMUNITY): Payer: Self-pay | Admitting: Hematology

## 2019-09-16 DIAGNOSIS — R7989 Other specified abnormal findings of blood chemistry: Secondary | ICD-10-CM | POA: Diagnosis not present

## 2019-09-16 DIAGNOSIS — Z803 Family history of malignant neoplasm of breast: Secondary | ICD-10-CM

## 2019-09-16 DIAGNOSIS — N400 Enlarged prostate without lower urinary tract symptoms: Secondary | ICD-10-CM | POA: Diagnosis not present

## 2019-09-16 DIAGNOSIS — Z951 Presence of aortocoronary bypass graft: Secondary | ICD-10-CM | POA: Diagnosis not present

## 2019-09-16 DIAGNOSIS — D751 Secondary polycythemia: Secondary | ICD-10-CM | POA: Diagnosis not present

## 2019-09-16 DIAGNOSIS — Z79899 Other long term (current) drug therapy: Secondary | ICD-10-CM

## 2019-09-16 LAB — CBC
HCT: 45.6 % (ref 39.0–52.0)
Hemoglobin: 15.5 g/dL (ref 13.0–17.0)
MCH: 32.5 pg (ref 26.0–34.0)
MCHC: 34 g/dL (ref 30.0–36.0)
MCV: 95.6 fL (ref 80.0–100.0)
Platelets: 157 10*3/uL (ref 150–400)
RBC: 4.77 MIL/uL (ref 4.22–5.81)
RDW: 13.2 % (ref 11.5–15.5)
WBC: 4.6 10*3/uL (ref 4.0–10.5)
nRBC: 0 % (ref 0.0–0.2)

## 2019-09-16 LAB — IRON AND TIBC
Iron: 152 ug/dL (ref 45–182)
Saturation Ratios: 39 % (ref 17.9–39.5)
TIBC: 394 ug/dL (ref 250–450)
UIBC: 242 ug/dL

## 2019-09-16 LAB — FERRITIN: Ferritin: 158 ng/mL (ref 24–336)

## 2019-09-16 NOTE — Assessment & Plan Note (Addendum)
1.  Elevated ferritin levels: -Blood work on 08/23/2019 at Dr. Almetta Lovely office showed serum ferritin of 324 (15-150).  Percent saturation was 47.  Serum iron was elevated at 144. -Patient had blood work drawn on 06/17/2019 at his PMDs office which showed ferritin of 298.  This was within the normal range. -Denies any joint problems or bronze pigmentation of the skin.  No fevers, or night sweats.  He had 9 pound weight loss when he was on amiodarone. -CT of the abdomen in July 2018 did not show any abnormalities in the liver.  No family history of hemochromatosis. -I plan to repeat ferritin and iron panel today.  If they remain high, we will obtain hemochromatosis testing.  If they are normal, we will discharge him from my clinic. -We will make a follow-up appointment next week.  2.  Erythrocytosis: -His labs on 06/10/2019 showed mildly elevated hemoglobin of 15.2, 15 being upper limit of normal. -This could be from dehydration as he is also on a diuretic. -We will repeat his CBC today and do further work-up if it is significantly elevated.

## 2019-09-16 NOTE — Progress Notes (Signed)
CONSULT NOTE  Patient Care Team: Chesley Noon, MD as PCP - General (Family Medicine) Nahser, Wonda Cheng, MD as PCP - Cardiology (Cardiology) Jacolyn Reedy, MD as Consulting Physician (Cardiology) Thompson Grayer, MD as Consulting Physician (Cardiology) [place], Mike Gip, MD as Consulting Physician (Pulmonary Disease) Danie Binder, MD as Consulting Physician (Gastroenterology)  CHIEF COMPLAINTS/PURPOSE OF CONSULTATION:  Elevated ferritin levels and erythrocytosis.  HISTORY OF PRESENTING ILLNESS:  Paul [name] 68 y.o. male is seen in consultation today at the request of Dr. Chalmers Cater for further work-up and management of elevated ferritin levels and mild erythrocytosis.  Blood work on 08/23/2019 showed elevated serum ferritin of 324 and percent saturation of 47 with elevated serum iron levels.  He denies being on any iron supplements.  He might have received a blood transfusion when he had bypass surgery couple of decades ago.  Denies any fevers or night sweats but lost about 9 pounds when he was on amiodarone which was discontinued secondary to thyroid problems.  Denies any nausea, vomiting, diarrhea or constipation.  Denies any joint pains or skin rashes.  Denies any bronze pigmentation of the skin.  Reports appetite and energy levels at 100%.  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  Family history was negative for hemochromatosis.  Maternal grandmother had breast cancer.  Denies any recurrent infections or hospitalizations.   MEDICAL HISTORY:  Past Medical History:  Diagnosis Date  . BPH (benign prostatic hyperplasia)   . CAD (coronary artery disease)    widely patent LAD LIMA graft, widely patent PDA PLR SVG, occluded Diag 1 OM 1 SVG by cardiac CT 2007   . CHF (congestive heart failure) ([place])   . GERD (gastroesophageal reflux disease)   . Hyperlipidemia   . IBS (irritable bowel syndrome)   . ICD (implantable  cardioverter-defibrillator) battery depletion   . Ischemic cardiomyopathy    Prior inferior infarct EF 35% by ECHO 07/13/11    . S/P CABG (coronary artery bypass graft)    CABG 06/1998 [place] with LIMA to LAD, SVG to OM, SVG to PD-PL.   OM graft occluded 10/12/2005 by CTA   . Sleep apnea   . Ventricular tachycardia ([place]) 10/15   VT at 188 bpm- unstable requiring external defibrillation    SURGICAL HISTORY: Past Surgical History:  Procedure Laterality Date  . APPENDECTOMY    . CORONARY ARTERY BYPASS GRAFT  1999  . IMPLANTABLE CARDIOVERTER DEFIBRILLATOR IMPLANT N/A 07/31/2014   MDT Gwyneth Revels XT DR ICD implanted by Dr Rayann Heman for secondary treatent of VT  . LEFT HEART CATHETERIZATION WITH CORONARY/GRAFT ANGIOGRAM N/A 07/30/2014   Procedure: LEFT HEART CATHETERIZATION WITH Beatrix Fetters;  Surgeon: Jacolyn Reedy, MD;  Location: [facility] CATH LAB;  Service: Cardiovascular;  Laterality: N/A;    SOCIAL HISTORY: Social History   Socioeconomic History  . Marital status: Married    Spouse name: Neoma Laming  . Number of children: 2  . Years of education: Not on file  . Highest education level: Not on file  Occupational History  . Occupation: retired  Scientific laboratory technician  . Financial resource strain: Not hard at all  . Food insecurity    Worry: Never true    Inability: Never true  . Transportation needs    Medical: No    Non-medical: No  Tobacco Use  . Smoking status: Never Smoker  . Smokeless tobacco: Never Used  Substance and Sexual Activity  . Alcohol  use: Yes    Alcohol/week: 2.0 standard drinks    Types: 2 Glasses of wine per week  . Drug use: No  . Sexual activity: Not on file  Lifestyle  . Physical activity    Days per week: 0 days    Minutes per session: 0 min  . Stress: Not at all  Relationships  . Social connections    Talks on phone: More than three times a week    Gets together: More than three times a week    Attends religious service: 1 to 4 times per year     Active member of club or organization: No    Attends meetings of clubs or organizations: Never    Relationship status: Married  . Intimate partner violence    Fear of current or ex partner: No    Emotionally abused: No    Physically abused: No    Forced sexual activity: No  Other Topics Concern  . Not on file  Social History Narrative  . Not on file    FAMILY HISTORY: Family History  Problem Relation Age of Onset  . Dementia Mother   . Cerebral aneurysm Father   . Pneumonia Brother   . Stroke Maternal Aunt   . Diabetes Neg Hx   . Heart attack Neg Hx   . Hypertension Neg Hx     ALLERGIES:  is allergic to carvedilol; escitalopram oxalate; niacin and related; and spironolactone.  MEDICATIONS:  Current Outpatient Medications  Medication Sig Dispense Refill  . Ascorbic Acid (VITAMIN C) 1000 MG tablet Take 1,000 mg by mouth daily.    Marland Kitchen atorvastatin (LIPITOR) 80 MG tablet Take 1 tablet (80 mg total) by mouth daily. 90 tablet 3  . cetirizine (ZYRTEC) 10 MG tablet Take 10 mg by mouth daily.    . clopidogrel (PLAVIX) 75 MG tablet Take 1 tablet (75 mg total) by mouth daily. 90 tablet 3  . Coenzyme Q10 50 MG CAPS Take 1 capsule by mouth daily.    Marland Kitchen eplerenone (INSPRA) 25 MG tablet TAKE (1) TABLET BY MOUTH ONCE DAILY. 90 tablet 2  . fenofibrate 160 MG tablet Take 160 mg by mouth daily.    . folic acid (FOLVITE) Q000111Q MCG tablet Take 800 mcg by mouth daily.     . hydrochlorothiazide (MICROZIDE) 12.5 MG capsule Take 1 capsule (12.5 mg total) by mouth as needed (weight gain 3lbs or more in 24 hours 5lbs in a week or swelling). 30 capsule 1  . Melatonin 5 MG TABS Take by mouth.    . metoprolol succinate (TOPROL-XL) 50 MG 24 hr tablet Take 1 tablet (50 mg total) by mouth daily. Take with or immediately following a meal. 90 tablet 3  . Omega-3 Fatty Acids (FISH OIL) 1000 MG CAPS Take 1 capsule by mouth daily.    Marland Kitchen omeprazole (PRILOSEC) 20 MG capsule TAKE 1 CAPSULE TWICE DAILY BEFORE A MEAL. 180  capsule 0  . ramipril (ALTACE) 10 MG capsule Take 1 capsule (10 mg total) by mouth daily. 90 capsule 3  . sotalol (BETAPACE) 80 MG tablet TAKE (1) TABLET BY MOUTH TWICE DAILY. 60 tablet 6  . Vitamin D, Cholecalciferol, 25 MCG (1000 UT) CAPS Take by mouth.    . Vitamin E 400 UNITS TABS Take 400 Units by mouth daily.     Marland Kitchen azelastine (OPTIVAR) 0.05 % ophthalmic solution Place 1 drop into both eyes as needed.     . fluticasone (FLONASE) 50 MCG/ACT nasal spray Place 2  sprays into both nostrils daily. (Patient not taking: Reported on 09/16/2019) 16 g 6  . Magnesium Oxide 400 MG CAPS Take 1 capsule (400 mg total) by mouth daily. (Patient not taking: Reported on 09/16/2019) 90 capsule 1  . nitroGLYCERIN (NITROSTAT) 0.4 MG SL tablet Place 1 tablet (0.4 mg total) under the tongue every 5 (five) minutes as needed for chest pain. (Patient not taking: Reported on 09/16/2019) 25 tablet 5  . sildenafil (VIAGRA) 50 MG tablet Take 1 tablet (50 mg total) by mouth daily as needed for erectile dysfunction. (Patient not taking: Reported on 09/16/2019) 20 tablet 0   No current facility-administered medications for this visit.     REVIEW OF SYSTEMS:   Constitutional: Denies fevers, chills or abnormal night sweats Eyes: Denies blurriness of vision, double vision or watery eyes Ears, nose, mouth, throat, and face: Denies mucositis or sore throat Respiratory: Denies cough, dyspnea or wheezes Cardiovascular: Denies palpitation, chest discomfort or lower extremity swelling Gastrointestinal:  Denies nausea, heartburn or change in bowel habits Skin: Denies abnormal skin rashes Lymphatics: Denies new lymphadenopathy or easy bruising Neurological:Denies numbness, tingling or new weaknesses Behavioral/Psych: Mood is stable, no new changes  All other systems were reviewed with the patient and are negative.  PHYSICAL EXAMINATION: ECOG PERFORMANCE STATUS: 1 - Symptomatic but completely ambulatory  Vitals:   09/16/19  1357  BP: 113/67  Pulse: 73  Resp: 18  Temp: (!) 96.9 F (36.1 C)  SpO2: 96%   Filed Weights   09/16/19 1357  Weight: 194 lb 4.8 oz (88.1 kg)    GENERAL:alert, no distress and comfortable SKIN: skin color, texture, turgor are normal, no rashes or significant lesions EYES: normal, conjunctiva are pink and non-injected, sclera clear OROPHARYNX:no exudate, no erythema and lips, buccal mucosa, and tongue normal  NECK: supple, thyroid normal size, non-tender, without nodularity LYMPH:  no palpable lymphadenopathy in the cervical, axillary or inguinal LUNGS: clear to auscultation and percussion with normal breathing effort HEART: regular rate & rhythm and no murmurs and no lower extremity edema ABDOMEN:abdomen soft, non-tender and normal bowel sounds Musculoskeletal:no cyanosis of digits and no clubbing  PSYCH: alert & oriented x 3 with fluent speech NEURO: no focal motor/sensory deficits  LABORATORY DATA:  I have reviewed the data as listed Recent Results (from the past 2160 hour(s))  CUP PACEART REMOTE DEVICE CHECK     Status: None   Collection Time: 08/08/19  4:16 AM  Result Value Ref Range   Date Time Interrogation Session [id number]    Pulse Generator Manufacturer MERM    Pulse Gen Model [id number] Evera XT DR    Pulse Gen Serial Number [id number]    Clinic Name [facility]    Implantable Pulse Generator Type Implantable Cardiac Defibulator    Implantable Pulse Generator Implant Date ME:6706271    Implantable Lead Manufacturer [facility]    Implantable Lead Model 5076 CapSureFix Novus    Implantable Lead Serial Number [id number]    Implantable Lead Implant Date ME:6706271    Implantable Lead Location Detail 1 APPENDAGE    Implantable Lead Location [id number]    Implantable Lead Manufacturer [facility]    Implantable Lead Model 6935 Sprint Quattro Secure S    Implantable Lead Serial Number [id number]    Implantable Lead Implant Date ME:6706271    Implantable Lead Location Detail 1 APEX     Implantable Lead Location [id number]    Lead Channel Setting Sensing Sensitivity 0.3 mV   Lead Channel Setting Pacing Amplitude 2.25 V  Lead Channel Setting Pacing Pulse Width 0.4 ms   Lead Channel Setting Pacing Amplitude 2.5 V   Lead Channel Impedance Value 475 ohm   Lead Channel Sensing Intrinsic Amplitude 1.75 mV   Lead Channel Pacing Threshold Amplitude 1 V   Lead Channel Pacing Threshold Pulse Width 0.4 ms   Lead Channel Impedance Value 361 ohm   Lead Channel Impedance Value 304 ohm   Lead Channel Sensing Intrinsic Amplitude 6 mV   Lead Channel Pacing Threshold Amplitude 1 V   Lead Channel Pacing Threshold Pulse Width 0.4 ms   HighPow Impedance 80 ohm   Battery Status OK    Battery Remaining Longevity 46 mo   Battery Voltage 2.98 V   Brady Statistic RA Percent Paced 11.24 %   Brady Statistic RV Percent Paced 0.04 %   Brady Statistic AP VP Percent 0.01 %   Brady Statistic AS VP Percent 0.04 %   Brady Statistic AP VS Percent 11.43 %   Brady Statistic AS VS Percent 88.53 %   Eval Rhythm SR at 62 bpm   Basic Metabolic Panel (BMET)     Status: Abnormal   Collection Time: 08/29/19  4:33 PM  Result Value Ref Range   Glucose 91 65 - 99 mg/dL   BUN 9 8 - 27 mg/dL   Creatinine, Ser 1.16 0.76 - 1.27 mg/dL   GFR calc non Af Amer 64 >59 mL/min/1.73   GFR calc Af Amer 74 >59 mL/min/1.73   BUN/Creatinine Ratio 8 (L) 10 - 24   Sodium 141 134 - 144 mmol/L   Potassium 4.0 3.5 - 5.2 mmol/L   Chloride 103 96 - 106 mmol/L   CO2 24 20 - 29 mmol/L   Calcium 9.4 8.6 - 10.2 mg/dL  Magnesium     Status: Abnormal   Collection Time: 08/29/19  4:33 PM  Result Value Ref Range   Magnesium 1.5 (L) 1.6 - 2.3 mg/dL  CUP PACEART INCLINIC DEVICE CHECK     Status: None   Collection Time: 08/30/19 12:22 AM  Result Value Ref Range   Date Time Interrogation Session [id number]    Pulse Generator Manufacturer MERM    Pulse Gen Model [id number] Evera XT DR    Pulse Gen Serial Number [id number]     Clinic Name [facility]    Implantable Pulse Generator Type Implantable Cardiac Defibulator    Implantable Pulse Generator Implant Date QP:5017656    Implantable Lead Manufacturer MERM    Implantable Lead Model 5076 CapSureFix Novus    Implantable Lead Serial Number [id number]    Implantable Lead Implant Date QP:5017656    Implantable Lead Location Detail 1 APPENDAGE    Implantable Lead Location [id number]    Implantable Lead Manufacturer [facility]    Implantable Lead Model 6935 Sprint Quattro Secure S    Implantable Lead Serial Number [id number]    Implantable Lead Implant Date QP:5017656    Implantable Lead Location Detail 1 APEX    Implantable Lead Location [id number]    Lead Channel Setting Sensing Sensitivity 0.3 mV   Lead Channel Setting Pacing Amplitude 2 V   Lead Channel Setting Pacing Pulse Width 0.4 ms   Lead Channel Setting Pacing Amplitude 2.5 V   Lead Channel Impedance Value 456 ohm   Lead Channel Sensing Intrinsic Amplitude 2 mV   Lead Channel Pacing Threshold Amplitude 1 V   Lead Channel Pacing Threshold Pulse Width 0.4 ms   Lead Channel Impedance Value 342 ohm   Lead Channel Impedance  Value 285 ohm   Lead Channel Sensing Intrinsic Amplitude 7.625 mV   Lead Channel Pacing Threshold Amplitude 1 V   Lead Channel Pacing Threshold Pulse Width 0.4 ms   HighPow Impedance 73 ohm   Battery Status OK    Battery Remaining Longevity 46 mo   Battery Voltage 2.95 V   Brady Statistic RA Percent Paced 12.28 %   Brady Statistic RV Percent Paced 0.05 %   Brady Statistic AP VP Percent 0.01 %   Brady Statistic AS VP Percent 0.03 %   Brady Statistic AP VS Percent 12.54 %   Brady Statistic AS VS Percent 87.41 %   Eval Rhythm AS/VS 61bpm   Ferritin     Status: None   Collection Time: 09/16/19  2:47 PM  Result Value Ref Range   Ferritin 158 24 - 336 ng/mL    Comment: Performed at [facility], [address] [place], [place] [address]  Iron and TIBC     Status: None   Collection Time: 09/16/19   2:47 PM  Result Value Ref Range   Iron 152 45 - 182 ug/dL   TIBC 394 250 - 450 ug/dL   Saturation Ratios 39 17.9 - 39.5 %   UIBC 242 ug/dL    Comment: Performed at [facility], [address] [place], [place] [address]  CBC     Status: None   Collection Time: 09/16/19  2:47 PM  Result Value Ref Range   WBC 4.6 4.0 - 10.5 K/uL   RBC 4.77 4.22 - 5.81 MIL/uL   Hemoglobin 15.5 13.0 - 17.0 g/dL   HCT 45.6 39.0 - 52.0 %   MCV 95.6 80.0 - 100.0 fL   MCH 32.5 26.0 - 34.0 pg   MCHC 34.0 30.0 - 36.0 g/dL   RDW 13.2 11.5 - 15.5 %   Platelets 157 150 - 400 K/uL   nRBC 0.0 0.0 - 0.2 %    Comment: Performed at [facility], [address], [place], [place] [address]    RADIOGRAPHIC STUDIES: I have personally reviewed the radiological images as listed and agreed with the findings in the report.  ASSESSMENT & PLAN:  Elevated ferritin level 1.  Elevated ferritin levels: -Blood work on 08/23/2019 at Dr. Almetta Lovely office showed serum ferritin of 324 (15-150).  Percent saturation was 47.  Serum iron was elevated at 144. -Patient had blood work drawn on 06/17/2019 at his PMDs office which showed ferritin of 298.  This was within the normal range. -Denies any joint problems or bronze pigmentation of the skin.  No fevers, or night sweats.  He had 9 pound weight loss when he was on amiodarone. -CT of the abdomen in July 2018 did not show any abnormalities in the liver.  No family history of hemochromatosis. -I plan to repeat ferritin and iron panel today.  If they remain high, we will obtain hemochromatosis testing.  If they are normal, we will discharge him from my clinic. -We will make a follow-up appointment next week.  2.  Erythrocytosis: -His labs on 06/10/2019 showed mildly elevated hemoglobin of 15.2, 15 being upper limit of normal. -This could be from dehydration as he is also on a diuretic. -We will repeat his CBC today and do further work-up if it is significantly elevated.   All questions  were answered. The patient knows to call the clinic with any problems, questions or concerns.      Derek Jack, MD 09/16/19 6:23 PM

## 2019-09-16 NOTE — Patient Instructions (Addendum)
[place] at [facility] Discharge Instructions  You were seen today by Dr. Delton Coombes. He went over your recent lab results. He reviewed your history, family history and how you've been feeling lately. He will have blood drawn today. He will schedule for a virtual visit in 1 week for follow up.   Thank you for choosing [place] at [facility] to provide your oncology and hematology care.  To afford each patient quality time with our provider, please arrive at least 15 minutes before your scheduled appointment time.   If you have a lab appointment with the [place] please come in thru the  Main Entrance and check in at the main information desk  You need to re-schedule your appointment should you arrive 10 or more minutes late.  We strive to give you quality time with our providers, and arriving late affects you and other patients whose appointments are after yours.  Also, if you no show three or more times for appointments you may be dismissed from the clinic at the providers discretion.     Again, thank you for choosing [facility].  Our hope is that these requests will decrease the amount of time that you wait before being seen by our physicians.       _____________________________________________________________  Should you have questions after your visit to [facility], please contact our office at [phone] between the hours of 8:00 a.m. and 4:30 p.m.  Voicemails left after 4:00 p.m. will not be returned until the following business day.  For prescription refill requests, have your pharmacy contact our office and allow 72 hours.    Cancer Center Support Programs:   > Cancer Support Group  2nd Tuesday of the month 1pm-2pm, Journey Room

## 2019-09-18 DIAGNOSIS — E538 Deficiency of other specified B group vitamins: Secondary | ICD-10-CM | POA: Diagnosis not present

## 2019-09-18 DIAGNOSIS — I10 Essential (primary) hypertension: Secondary | ICD-10-CM | POA: Diagnosis not present

## 2019-09-18 DIAGNOSIS — E0590 Thyrotoxicosis, unspecified without thyrotoxic crisis or storm: Secondary | ICD-10-CM | POA: Diagnosis not present

## 2019-09-18 DIAGNOSIS — J301 Allergic rhinitis due to pollen: Secondary | ICD-10-CM | POA: Diagnosis not present

## 2019-09-18 DIAGNOSIS — Z0000 Encounter for general adult medical examination without abnormal findings: Secondary | ICD-10-CM | POA: Diagnosis not present

## 2019-09-18 DIAGNOSIS — K219 Gastro-esophageal reflux disease without esophagitis: Secondary | ICD-10-CM | POA: Diagnosis not present

## 2019-09-18 DIAGNOSIS — Z23 Encounter for immunization: Secondary | ICD-10-CM | POA: Diagnosis not present

## 2019-09-25 ENCOUNTER — Inpatient Hospital Stay (HOSPITAL_COMMUNITY): Payer: PPO | Attending: Hematology | Admitting: Hematology

## 2019-09-25 ENCOUNTER — Other Ambulatory Visit: Payer: Self-pay

## 2019-09-25 ENCOUNTER — Encounter (HOSPITAL_COMMUNITY): Payer: Self-pay | Admitting: Hematology

## 2019-09-25 DIAGNOSIS — R7989 Other specified abnormal findings of blood chemistry: Secondary | ICD-10-CM | POA: Diagnosis not present

## 2019-09-25 NOTE — Progress Notes (Signed)
Virtual Visit via Telephone Note  I connected with Paul [name] on 09/25/19 at  4:05 PM EST by telephone and verified that I am speaking with the correct person using two identifiers.   I discussed the limitations, risks, security and privacy concerns of performing an evaluation and management service by telephone and the availability of in person appointments. I also discussed with the patient that there may be a patient responsible charge related to this service. The patient expressed understanding and agreed to proceed.   History of Present Illness: He was seen at the request of Dr. Chalmers Cater when he was found to have elevated ferritin of 324 (15-1 50).  Percent saturation was 47.  Serum iron was also elevated at 144.  CT of the abdomen in July 2018 did not show any abnormalities in the liver.  No family history of hemochromatosis.   Observations/Objective: He denies any fevers, night sweats.  He lost about 9 pounds when he was on amiodarone.  Denies any joint pains or bronze pigmentation of skin.  Assessment and Plan:  1.  Elevated ferritin levels: -We repeated labs on 09/16/2019.  Ferritin was 158 and normal.  Percent saturation is 39 and normal.  Serum iron is also normal. -No further testing is indicated.  I will see him back on a as needed basis.  2.  Elevated hemoglobin: -Repeat CBC on 09/16/2019 shows hemoglobin normal at 15.5.  The elevated hemoglobin on prior labs could be from dehydration. -No further testing is indicated.   Follow Up Instructions: RTC as needed.   I discussed the assessment and treatment plan with the patient. The patient was provided an opportunity to ask questions and all were answered. The patient agreed with the plan and demonstrated an understanding of the instructions.   The patient was advised to call back or seek an in-person evaluation if the symptoms worsen or if the condition fails to improve as anticipated.  I provided 11 minutes of  non-face-to-face time during this encounter.   Derek Jack, MD

## 2019-09-26 DIAGNOSIS — D044 Carcinoma in situ of skin of scalp and neck: Secondary | ICD-10-CM | POA: Diagnosis not present

## 2019-09-26 DIAGNOSIS — L570 Actinic keratosis: Secondary | ICD-10-CM | POA: Diagnosis not present

## 2019-09-30 ENCOUNTER — Ambulatory Visit (INDEPENDENT_AMBULATORY_CARE_PROVIDER_SITE_OTHER): Payer: PPO

## 2019-09-30 DIAGNOSIS — I5022 Chronic systolic (congestive) heart failure: Secondary | ICD-10-CM | POA: Diagnosis not present

## 2019-09-30 DIAGNOSIS — Z95810 Presence of automatic (implantable) cardiac defibrillator: Secondary | ICD-10-CM

## 2019-09-30 NOTE — Progress Notes (Signed)
EPIC Encounter for ICM Monitoring  Patient Name: Paul [name] is a 68 y.o. male Date: 09/30/2019 Primary Care Physican: Chesley Noon, MD Primary [place] Electrophysiologist: Allred 09/30/2019 Weight: 186 - 187 lbs   Spoke with patient and he is feeling fine.He denies fluid symptoms and thinks the fluid accumulation may be related to holiday foods.   Optivol thoracic impedancesuggesting possible fluid accumulation since 09/13/2019.   Prescribed:Hydrochlorothiazide 12.5 mgTake 1 capsule (12.5 mg total) by mouth as needed (weight gain 3lbs or more in 24 hours 5lbs in a week or swelling).  Labs: 11/12/2018 Creatinine 1.15, BUN 14, Potassium 4.3, Sodium 140, GFR 65-76  Recommendations: Advised to take Hydrochlorothiazide 12.5 mgTake 1 capsule daily x 2 days and they return to PRN.  Follow-up plan: ICM clinic phone appointment on 10/04/2019 (Manual send) to recheck fluid levels.   91 day device clinic remote transmission 11/07/2019.      Copy of ICM check sent to Dr. Rayann Heman and Dr Bronson Ing.   3 month ICM trend: 09/30/2019    1 Year ICM trend:       [name], RN 09/30/2019 3:57 PM

## 2019-10-04 ENCOUNTER — Telehealth: Payer: Self-pay

## 2019-10-04 ENCOUNTER — Ambulatory Visit (INDEPENDENT_AMBULATORY_CARE_PROVIDER_SITE_OTHER): Payer: PPO

## 2019-10-04 DIAGNOSIS — Z95810 Presence of automatic (implantable) cardiac defibrillator: Secondary | ICD-10-CM

## 2019-10-04 DIAGNOSIS — I5022 Chronic systolic (congestive) heart failure: Secondary | ICD-10-CM

## 2019-10-04 NOTE — Telephone Encounter (Signed)
Left message for patient to remind of missed remote transmission.  

## 2019-10-04 NOTE — Telephone Encounter (Signed)
Remote ICM transmission received.  Attempted call to patient regarding ICM remote transmission and left detailed message per DPR.  Advised to return call for any fluid symptoms or questions. Next ICM remote transmission scheduled 08/28/2020.   ° ° °

## 2019-10-04 NOTE — Progress Notes (Signed)
EPIC Encounter for ICM Monitoring  Patient Name: Paul [name] is a 68 y.o. male Date: 10/04/2019 Primary Care Physican: Chesley Noon, MD Primary [place] Electrophysiologist: Allred 09/30/2019 Weight: 186 - 187 lbs   Attempted call to patient and unable to reach.  Left detailed message per DPR regarding transmission. Transmission reviewed. Marland Kitchen   Optivol thoracic impedance returned to normal after taking Hydrochlorothiazide 12.5 mgTake 1 capsule daily x 2 days.  Prescribed:Hydrochlorothiazide 12.5 mgTake 1 capsule (12.5 mg total) by mouth as needed (weight gain 3lbs or more in 24 hours 5lbs in a week or swelling).  Labs: 11/12/2018 Creatinine 1.15, BUN 14, Potassium 4.3, Sodium 140, GFR 65-76  Recommendations: Left voice mail with ICM number and encouraged to call if experiencing any fluid symptoms.  Follow-up plan: ICM clinic phone appointment on 11/08/2019.   91 day device clinic remote transmission 11/07/2019.      Copy of ICM check sent to Dr. Rayann Heman.  3 month ICM trend: 10/04/2019    1 Year ICM trend:       [name], RN 10/04/2019 4:53 PM

## 2019-10-08 ENCOUNTER — Other Ambulatory Visit: Payer: Self-pay | Admitting: Cardiovascular Disease

## 2019-10-11 ENCOUNTER — Other Ambulatory Visit: Payer: Self-pay

## 2019-10-11 ENCOUNTER — Ambulatory Visit
Admission: EM | Admit: 2019-10-11 | Discharge: 2019-10-11 | Disposition: A | Discharge status: Home / Self Care | Payer: PPO | Attending: Emergency Medicine | Admitting: Emergency Medicine

## 2019-10-11 DIAGNOSIS — M10072 Idiopathic gout, left ankle and foot: Secondary | ICD-10-CM | POA: Diagnosis not present

## 2019-10-11 DIAGNOSIS — M109 Gout, unspecified: Secondary | ICD-10-CM

## 2019-10-11 DIAGNOSIS — M79675 Pain in left toe(s): Secondary | ICD-10-CM | POA: Diagnosis not present

## 2019-10-11 MED ORDER — IBUPROFEN 400 MG PO TABS: 400.0000 mg | ORAL_TABLET | Freq: Four times a day (QID) | ORAL | 0 refills | Status: DC | PRN

## 2019-10-11 MED ORDER — PREDNISONE 10 MG PO TABS: 20.0000 mg | ORAL_TABLET | Freq: Every day | ORAL | 0 refills | Status: DC

## 2019-10-11 NOTE — Discharge Instructions (Addendum)
Prescribed prednisone take as directed and to completion Primary care provider assistance initiated to establish care Follow up with PCP for further evaluation and management Referral to rheumatology may be needed Return or go to the ED if you have any new or worsening symptoms

## 2019-10-11 NOTE — ED Triage Notes (Signed)
Pt presents with left great toe pain that began 2 days ago pain has worsened

## 2019-10-11 NOTE — ED Provider Notes (Signed)
[facility]    CSN: [id number] Arrival date & time: 10/11/19  1523      History   Chief Complaint Chief Complaint  Patient presents with  . Toe Pain    HPI Paul [name] is a 68 y.o. male.   Paul [name] 68 years old male presented to the urgent care with a complaint of left great toe pain that started 2 days ago.  He describes the pain as aching and stabbing while moving.  Pain is rated at 6 on a scale of 1-10 and is worse with movement.  Patient has not tried any medication.  Denies injury or trauma, and history of gout.  The history is provided by the patient.  Toe Pain    Past Medical History:  Diagnosis Date  . BPH (benign prostatic hyperplasia)   . CAD (coronary artery disease)    widely patent LAD LIMA graft, widely patent PDA PLR SVG, occluded Diag 1 OM 1 SVG by cardiac CT 2007   . CHF (congestive heart failure) ([place])   . GERD (gastroesophageal reflux disease)   . Hyperlipidemia   . IBS (irritable bowel syndrome)   . 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 Ischemic cardiomyopathy    Prior inferior infarct EF 35% by ECHO 07/13/11    . S/P CABG (coronary artery bypass graft)    CABG 06/1998 [place], [place] with LIMA to LAD, SVG to OM, SVG to PD-PL.   OM graft occluded 10/12/2005 by CTA   . Sleep apnea   . Ventricular tachycardia ([place]) 10/15   VT at 188 bpm- unstable requiring external defibrillation    Patient Active Problem List   Diagnosis Date Noted  . Elevated ferritin level 09/16/2019  . Physical exam 10/21/2016  . B12 deficiency 06/30/2016  . OSA (obstructive sleep apnea) 12/17/2015  . Hypersomnia 12/17/2015  . Obesity 12/17/2015  . ICD (implantable cardioverter-defibrillator) in place 07/31/2014  . Ventricular tachycardia ([place]) 07/29/2014  . CAD (coronary artery disease)   . S/P CABG (coronary artery bypass graft)   . Hyperlipidemia   . Depression   . Ischemic cardiomyopathy     Past Surgical History:    Procedure Laterality Date  . APPENDECTOMY    . CORONARY ARTERY BYPASS GRAFT  1999  . IMPLANTABLE CARDIOVERTER DEFIBRILLATOR IMPLANT N/A 07/31/2014   MDT Gwyneth Revels XT DR ICD implanted by Dr Rayann Heman for secondary treatent of VT  . LEFT HEART CATHETERIZATION WITH CORONARY/GRAFT ANGIOGRAM N/A 07/30/2014   Procedure: LEFT HEART CATHETERIZATION WITH Beatrix Fetters;  Surgeon: Jacolyn Reedy, MD;  Location: [facility] C[facility] LAB;  Service: Cardiovascular;  Laterality: N/A;       Home Medications    Prior to Admission medications   Medication Sig Start Date End Date Taking? Authorizing Provider  Ascorbic Acid (VITAMIN C) 1000 MG tablet Take 1,000 mg by mouth daily.    [provider]  atorvastatin (LIPITOR) 80 MG tablet Take 1 tablet (80 mg total) by mouth daily. 01/21/19   Nahser, Wonda Cheng, MD  azelastine (OPTIVAR) 0.05 % ophthalmic solution Place 1 drop into both eyes as needed.  06/14/19   [provider]  cetirizine (ZYRTEC) 10 MG tablet Take 10 mg by mouth daily.    [provider]  clopidogrel (PLAVIX) 75 MG tablet Take 1 tablet (75 mg total) by mouth daily. 09/03/19   Herminio Commons, MD  Coenzyme Q10 50 MG CAPS Take 1 capsule by mouth daily.    [provider]  eplerenone (  INSPRA) 25 MG tablet TAKE (1) TABLET BY MOUTH ONCE DAILY. 10/08/19   Allred, Jeneen Rinks, MD  fenofibrate 160 MG tablet Take 160 mg by mouth daily.    [provider]  fluticasone (FLONASE) 50 MCG/ACT nasal spray Place 2 sprays into both nostrils daily. Patient not taking: Reported on 09/16/2019 10/21/16   Midge Minium, MD  folic acid (FOLVITE) Q000111Q MCG tablet Take 800 mcg by mouth daily.     [provider]  hydrochlorothiazide (MICROZIDE) 12.5 MG capsule Take 1 capsule (12.5 mg total) by mouth as needed (weight gain 3lbs or more in 24 hours 5lbs in a week or swelling). Patient not taking: Reported on 09/25/2019 05/28/19 09/16/19  Baldwin Jamaica, PA-C  ibuprofen  (ADVIL) 400 MG tablet Take 1 tablet (400 mg total) by mouth every 6 (six) hours as needed. Take with food 10/11/19   Antha Niday, Darrelyn Hillock, FNP  Melatonin 5 MG TABS Take by mouth.    [provider]  metoprolol succinate (TOPROL-XL) 50 MG 24 hr tablet Take 1 tablet (50 mg total) by mouth daily. Take with or immediately following a meal. 09/11/19   Allred, Jeneen Rinks, MD  nitroGLYCERIN (NITROSTAT) 0.4 MG SL tablet Place 1 tablet (0.4 mg total) under the tongue every 5 (five) minutes as needed for chest pain. Patient not taking: Reported on 09/16/2019 12/20/18   Nahser, Wonda Cheng, MD  Omega-3 Fatty Acids (FISH OIL) 1000 MG CAPS Take 1 capsule by mouth daily.    [provider]  omeprazole (PRILOSEC) 20 MG capsule TAKE 1 CAPSULE TWICE DAILY BEFORE A MEAL. 06/13/17   Midge Minium, MD  predniSONE (DELTASONE) 10 MG tablet Take 2 tablets (20 mg total) by mouth daily. 10/11/19   Clark Cuff, Darrelyn Hillock, FNP  ramipril (ALTACE) 10 MG capsule Take 1 capsule (10 mg total) by mouth daily. 05/15/19   Nahser, Wonda Cheng, MD  sildenafil (VIAGRA) 50 MG tablet Take 1 tablet (50 mg total) by mouth daily as needed for erectile dysfunction. Patient not taking: Reported on 09/16/2019 09/03/19   Herminio Commons, MD  sotalol (BETAPACE) 80 MG tablet TAKE (1) TABLET BY MOUTH TWICE DAILY. 06/19/19   Thompson Grayer, MD  Vitamin D, Cholecalciferol, 25 MCG (1000 UT) CAPS Take by mouth.    [provider]  Vitamin E 400 UNITS TABS Take 400 Units by mouth daily.     [provider]    Family History Family History  Problem Relation Age of Onset  . Dementia Mother   . Cerebral aneurysm Father   . Pneumonia Brother   . Stroke Maternal Aunt   . Diabetes Neg Hx   . Heart attack Neg Hx   . Hypertension Neg Hx     Social History Social History   Tobacco Use  . Smoking status: Never Smoker  . Smokeless tobacco: Never Used  Substance Use Topics  . Alcohol use: Yes    Alcohol/week: 2.0 standard  drinks    Types: 2 Glasses of wine per week  . Drug use: No     Allergies   Carvedilol, Escitalopram oxalate, Niacin and related, and Spironolactone   Review of Systems Review of Systems  Constitutional: Negative.   Respiratory: Negative.   Cardiovascular: Negative.   Musculoskeletal: Positive for joint swelling.  ROS: All other are negatives   Physical Exam Triage Vital Signs ED Triage Vitals  Enc Vitals Group     BP 10/11/19 1532 127/70     Pulse Rate 10/11/19 1532 69  Resp 10/11/19 1532 20     Temp 10/11/19 1532 98.5 F (36.9 C)     Temp src --      SpO2 10/11/19 1532 94 %     Weight --      Height --      Head Circumference --      Peak Flow --      Pain Score 10/11/19 1530 6     Pain Loc --      Pain Edu? --      Excl. in [place]? --    No data found.  Updated Vital Signs BP 127/70   Pulse 69   Temp 98.5 F (36.9 C)   Resp 20   SpO2 94%   Visual Acuity Right Eye Distance:   Left Eye Distance:   Bilateral Distance:    Right Eye Near:   Left Eye Near:    Bilateral Near:     Physical Exam Vitals and nursing note reviewed.  Constitutional:      General: He is not in acute distress.    Appearance: He is normal weight. He is not ill-appearing.  Cardiovascular:     Rate and Rhythm: Normal rate and regular rhythm.     Heart sounds: Normal heart sounds.  Pulmonary:     Effort: Pulmonary effort is normal. No respiratory distress.     Breath sounds: Normal breath sounds.  Chest:     Chest wall: No tenderness.  Musculoskeletal:        General: Swelling and tenderness present.     Right lower leg: No edema.     Left lower leg: No edema.     Right foot: Normal. Normal capillary refill. No swelling, deformity or tenderness.     Left foot: Normal capillary refill. Tenderness present. No swelling or deformity.       Legs:     Comments: Left toe tenderness present,  full range of motion with pain.  No crepitus or bony tenderness  Neurological:      Mental Status: He is alert.      UC Treatments / Results  Labs (all labs ordered are listed, but only abnormal results are displayed) Labs Reviewed - No data to display  EKG   Radiology No results found.  Procedures Procedures (including critical care time)  Medications Ordered in UC Medications - No data to display  Initial Impression / Assessment and Plan / UC Course  I have reviewed the triage vital signs and the nursing notes.  Pertinent labs & imaging results that were available during my care of the patient were reviewed by me and considered in my medical decision making (see chart for details).   Patient symptoms and physical examination are more consistent with gout.  Will treat patient for gout flare.  Will prescribe prednisone And ibuprofen.  Advised patient to follow with primary care for possibly referral to rheumatology.  To return to the urgent care if symptoms get worse.   Final Clinical Impressions(s) / UC Diagnoses   Final diagnoses:  Gouty arthritis of left great toe     Discharge Instructions     Prescribed prednisone take as directed and to completion Primary care provider assistance initiated to establish care Follow up with PCP for further evaluation and management Referral to rheumatology may be needed Return or go to the ED if you have any new or worsening symptoms     ED Prescriptions    Medication Sig Dispense Auth. Provider  predniSONE (DELTASONE) 10 MG tablet Take 2 tablets (20 mg total) by mouth daily. 15 tablet Arun Herrod S, FNP   ibuprofen (ADVIL) 400 MG tablet Take 1 tablet (400 mg total) by mouth every 6 (six) hours as needed. Take with food 30 tablet Lorenza Shakir, Darrelyn Hillock, FNP     PDMP not reviewed this encounter.   Emerson Monte, [place] 10/11/19 [phone]

## 2019-10-22 ENCOUNTER — Encounter: Payer: Self-pay | Admitting: Internal Medicine

## 2019-11-01 ENCOUNTER — Ambulatory Visit: Payer: PPO | Attending: Internal Medicine

## 2019-11-01 ENCOUNTER — Other Ambulatory Visit: Payer: Self-pay

## 2019-11-01 DIAGNOSIS — Z20822 Contact with and (suspected) exposure to covid-19: Secondary | ICD-10-CM

## 2019-11-02 ENCOUNTER — Encounter: Payer: Self-pay | Admitting: Gastroenterology

## 2019-11-02 LAB — NOVEL CORONAVIRUS, NAA: SARS-CoV-2, NAA: NOT DETECTED

## 2019-11-02 NOTE — Progress Notes (Signed)
Referring Provider: Chesley Noon, MD Primary Care Physician:  Chesley Noon, MD Primary Gastroenterologist:  Dr. Gala Romney  Chief Complaint  Patient presents with  . Irritable Bowel Syndrome    Gerd,Diarrhea for days,Bloating,Cramping    HPI:   Paul [name] is a 69 y.o. male presenting today at the request of Chesley Noon, MD for IBS and acid reflux.  Past medical history of CAD, ischemic cardiomyopathy with EF of 30-35% in June 2020, V. tach in 2015 s/p ICD placement, HLD, IBS, GERD, and sleep apnea.  Today: GERD: Omeprazole 20 mg BID. This works well. No dysphagia. .   IBS: States he has had this for ever.  Present since he was a teenager.  No problems for 2 weeks to a month, then will have diarrhea and abdominal cramps for a couple of days. Will take an imodium which improves his symptoms. States stools will be "pure water."  No blood in the stool. States he can eat foods and they wont bother him but the next time he eats it, it will cause diarrhea.  Gives example of cheese pizza. Admits to lactose intolerance. Drinks lactose free milk and eats lactose free ice cream.  Still contains other dairy products, especially cheese.  On days he has diarrhea, he will have a BM every 30 minutes to hour. No nocturnal stools. Imodium helps but he waits to take this after he has had several BMs. Sometimes 1 imodium will stop the diarrhea, if not, 2 imodium will help. Between episodes of diarrhea, will have 1-2 soft formed BMs daily. Last episode of diarrhea was the first of last week. Weight loss when he had thyroid issues. Amiodarone was discontinued as this was causing thyroid trouble. Thyroid function is back to normal. Weight has been stable. No history of celiac disease that he knows of. No worsening of diarrhea with pasta or breads. Fried foods typically will cause diarrhea. No black stools. When diarrhea first starts, he has mild nausea without vomiting.   Just started daily  probiotic about 1 week ago.  No recent antibiotics or hospitalizations.    States he was checked for C. difficile in the past and this was negative.  Reports he was on Bentyl in the past which did not help.  Rare NSAID use. Drinks a couple beer a day and wine with dinner.   Last colonoscopy was in August 2011 at [facility].  Indication: Diarrhea.  Impression: Mild nonspecific erythematous mucosa in descending colon s/p biopsied, question colitis-possible ischemic in nature, doubt infectious or inflammatory.  Diverticulosis.  Internal hemorrhoids.  Pathology with focal active colitis, nonspecific, main consideration includes acute infectious colitis.  NSAID induced colitis and/or Crohn's disease also in the differential but less likely.  Past Medical History:  Diagnosis Date  . BPH (benign prostatic hyperplasia)   . CAD (coronary artery disease)    widely patent LAD LIMA graft, widely patent PDA PLR SVG, occluded Diag 1 OM 1 SVG by cardiac CT 2007   . CHF (congestive heart failure) ([place])    ECHO 03/2019: EF 30-35%  . GERD (gastroesophageal reflux disease)   . Hyperlipidemia   . IBS (irritable bowel syndrome)   . 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 Ischemic cardiomyopathy    Prior inferior infarct EF 35% by ECHO 07/13/11 ; ECHO 03/2019: EF 30-35%  . S/P CABG (coronary artery bypass graft)    CABG 06/1998 [place], [place] with LIMA to LAD, SVG to OM, SVG to PD-PL.  OM graft occluded 10/12/2005 by CTA   . Sleep apnea   . Ventricular tachycardia ([place]) 10/15   VT at 188 bpm- unstable requiring external defibrillation    Past Surgical History:  Procedure Laterality Date  . APPENDECTOMY    . COLONOSCOPY  06/11/2010   [place]; mild nonspecific erythematous mucosa in the descending colon s/p biopsied, diverticulosis, internal hemorrhoids.  Pathology with focal active colitis, nonspecific, main consideration includes acute infectious colitis.  NSAID induced colitis  and/or Crohn's disease also in the differential but less likely.  . CORONARY ARTERY BYPASS GRAFT  1999  . IMPLANTABLE CARDIOVERTER DEFIBRILLATOR IMPLANT N/A 07/31/2014   MDT Gwyneth Revels XT DR ICD implanted by Dr Rayann Heman for secondary treatent of VT  . LEFT HEART CATHETERIZATION WITH CORONARY/GRAFT ANGIOGRAM N/A 07/30/2014   Procedure: LEFT HEART CATHETERIZATION WITH Beatrix Fetters;  Surgeon: Jacolyn Reedy, MD;  Location: [facility] CATH LAB;  Service: Cardiovascular;  Laterality: N/A;    Current Outpatient Medications  Medication Sig Dispense Refill  . Ascorbic Acid (VITAMIN C) 1000 MG tablet Take 1,000 mg by mouth daily.    Marland Kitchen atorvastatin (LIPITOR) 80 MG tablet Take 1 tablet (80 mg total) by mouth daily. 90 tablet 3  . azelastine (OPTIVAR) 0.05 % ophthalmic solution Place 1 drop into both eyes as needed.     . cetirizine (ZYRTEC) 10 MG tablet Take 10 mg by mouth daily.    . clopidogrel (PLAVIX) 75 MG tablet Take 1 tablet (75 mg total) by mouth daily. 90 tablet 3  . Coenzyme Q10 50 MG CAPS Take 1 capsule by mouth daily.    Marland Kitchen eplerenone (INSPRA) 25 MG tablet TAKE (1) TABLET BY MOUTH ONCE DAILY. 90 tablet 3  . fenofibrate 160 MG tablet Take 160 mg by mouth daily.    . fluticasone (FLONASE) 50 MCG/ACT nasal spray Place 2 sprays into both nostrils daily. (Patient taking differently: Place 2 sprays into both nostrils as needed. ) 16 g 6  . folic acid (FOLVITE) 742 MCG tablet Take 800 mcg by mouth daily.     . hydrochlorothiazide (MICROZIDE) 12.5 MG capsule Take 1 capsule (12.5 mg total) by mouth as needed (weight gain 3lbs or more in 24 hours 5lbs in a week or swelling). 30 capsule 1  . ibuprofen (ADVIL) 400 MG tablet Take 1 tablet (400 mg total) by mouth every 6 (six) hours as needed. Take with food 30 tablet 0  . Melatonin 5 MG TABS Take by mouth.    . metoprolol succinate (TOPROL-XL) 50 MG 24 hr tablet Take 1 tablet (50 mg total) by mouth daily. Take with or immediately following a meal. 90 tablet 3   . nitroGLYCERIN (NITROSTAT) 0.4 MG SL tablet Place 1 tablet (0.4 mg total) under the tongue every 5 (five) minutes as needed for chest pain. (Patient taking differently: Place 0.4 mg under the tongue as needed for chest pain. ) 25 tablet 5  . Omega-3 Fatty Acids (FISH OIL) 1000 MG CAPS Take 1 capsule by mouth daily.    Marland Kitchen omeprazole (PRILOSEC) 20 MG capsule TAKE 1 CAPSULE TWICE DAILY BEFORE A MEAL. 180 capsule 0  . Probiotic Product (DAILY PROBIOTIC PO) Take by mouth daily.    . ramipril (ALTACE) 10 MG capsule Take 1 capsule (10 mg total) by mouth daily. 90 capsule 3  . sildenafil (VIAGRA) 50 MG tablet Take 1 tablet (50 mg total) by mouth daily as needed for erectile dysfunction. (Patient taking differently: Take 50 mg by mouth as needed for  erectile dysfunction. ) 20 tablet 0  . sotalol (BETAPACE) 80 MG tablet TAKE (1) TABLET BY MOUTH TWICE DAILY. 60 tablet 6  . Vitamin D, Cholecalciferol, 25 MCG (1000 UT) CAPS Take by mouth.    . Vitamin E 400 UNITS TABS Take 400 Units by mouth daily.     . pantoprazole (PROTONIX) 20 MG tablet Take 1 tablet (20 mg total) by mouth 2 (two) times daily. 60 tablet 11   No current facility-administered medications for this visit.    Allergies as of 11/04/2019 - Review Complete 11/04/2019  Allergen Reaction Noted  . Carvedilol  01/18/2012  . Escitalopram oxalate  01/18/2012  . Niacin and related  01/18/2012  . Spironolactone  01/18/2012    Family History  Problem Relation Age of Onset  . Dementia Mother   . Cerebral aneurysm Father   . Pneumonia Brother   . Stroke Maternal Aunt   . Diabetes Neg Hx   . Heart attack Neg Hx   . Hypertension Neg Hx   . Colon cancer Neg Hx     Social History   Socioeconomic History  . Marital status: Married    Spouse name: Neoma Laming  . Number of children: 2  . Years of education: Not on file  . Highest education level: Not on file  Occupational History  . Occupation: retired  Tobacco Use  . Smoking status: Never  Smoker  . Smokeless tobacco: Never Used  Substance and Sexual Activity  . Alcohol use: Yes    Alcohol/week: 2.0 standard drinks    Types: 2 Glasses of wine per week    Comment: Couple beers a day and wine with dinner.   . Drug use: No  . Sexual activity: Not on file  Other Topics Concern  . Not on file  Social History Narrative  . Not on file   Social Determinants of Health   Financial Resource Strain: Low Risk   . Difficulty of Paying Living Expenses: Not hard at all  Food Insecurity: No Food Insecurity  . Worried About Charity fundraiser in the Last Year: Never true  . Ran Out of Food in the Last Year: Never true  Transportation Needs: No Transportation Needs  . Lack of Transportation (Medical): No  . Lack of Transportation (Non-Medical): No  Physical Activity: Inactive  . Days of Exercise per Week: 0 days  . Minutes of Exercise per Session: 0 min  Stress: No Stress Concern Present  . Feeling of Stress : Not at all  Social Connections: Slightly Isolated  . Frequency of Communication with Friends and Family: More than three times a week  . Frequency of Social Gatherings with Friends and Family: More than three times a week  . Attends Religious Services: 1 to 4 times per year  . Active Member of Clubs or Organizations: No  . Attends Archivist Meetings: Never  . Marital Status: Married  Human resources officer Violence: Not At Risk  . Fear of Current or Ex-Partner: No  . Emotionally Abused: No  . Physically Abused: No  . Sexually Abused: No    Review of Systems: Gen: Denies any fever, chills. Gets a little swimmy headed at times with diarrhea. No lightheadedness, dizziness otherwise. No pre-syncope or syncope.  CV: Denies chest pain or heart palpitations Resp: Denies shortness of breath or cough GI: See HPI GU : Denies urinary burning, urinary frequency, urinary hesitancy MS: Denies joint pain.  Derm: Denies rash Psych: Admits to anxiety. Denies depression.  Heme: Denies bruising or bleeding  Physical Exam: BP (!) 147/86   Pulse 72   Temp (!) 96.2 F (35.7 C)   Ht '5\' 8"'  (1.727 m)   Wt 197 lb 3.2 oz (89.4 kg)   BMI 29.98 kg/m  General:   Alert and oriented. Pleasant and cooperative. Well-nourished and well-developed.  Head:  Normocephalic and atraumatic. Eyes:  Without icterus, sclera clear and conjunctiva pink.  Ears:  Normal auditory acuity. Lungs:  Clear to auscultation bilaterally. No wheezes, rales, or rhonchi. No distress.  Heart:  S1, S2 present without murmurs appreciated.  Abdomen:  +BS, soft, non-tender and non-distended. No HSM noted. No guarding or rebound. No masses appreciated.  Rectal:  Deferred  Msk:  Symmetrical without gross deformities. Normal posture. Extremities:  Without edema. Neurologic:  Alert and  oriented x4;  grossly normal neurologically. Skin:  Intact without significant lesions or rashes. Psych: Normal mood and affect.  Labs from PCP: 03/13/2019 CMP: Glucose 87, creatinine 1.16, sodium 143, potassium 4.4, calcium 9.5, albumin 4.4, total bilirubin 0.8, alk phos 79, AST 17, ALT 10 06/19/2019 CBC: WBC 5.1, hemoglobin 15.7, MCV 96, MCH 32.2, MCHC 33.6, platelet count 175 Iron panel: Iron 138, iron saturation 41%, TIBC 333, UIBC 195

## 2019-11-04 ENCOUNTER — Encounter: Payer: Self-pay | Admitting: Gastroenterology

## 2019-11-04 ENCOUNTER — Ambulatory Visit (INDEPENDENT_AMBULATORY_CARE_PROVIDER_SITE_OTHER): Payer: PPO | Admitting: Gastroenterology

## 2019-11-04 ENCOUNTER — Other Ambulatory Visit: Payer: Self-pay

## 2019-11-04 DIAGNOSIS — R197 Diarrhea, unspecified: Secondary | ICD-10-CM | POA: Insufficient documentation

## 2019-11-04 DIAGNOSIS — K219 Gastro-esophageal reflux disease without esophagitis: Secondary | ICD-10-CM

## 2019-11-04 MED ORDER — PANTOPRAZOLE SODIUM 20 MG PO TBEC: 20.0000 mg | DELAYED_RELEASE_TABLET | Freq: Two times a day (BID) | ORAL | 11 refills | Status: DC

## 2019-11-04 NOTE — Patient Instructions (Addendum)
Please stop omeprazole and start Protonix 20 mg twice daily, 30 minutes before breakfast and 30 minutes before dinner.  If this does not work as well as your omeprazole, please call and let us know.  I am changing this medication due to potential interactions between omeprazole and Plavix.  For your IBS symptoms, I suspect this is likely dietary related.  Please follow a strict lactose-free diet or take Lactaid tablets prior to consuming any dairy products.  You should also follow a low fat/low cholesterol diet.  Continue daily probiotic.  Continue taking Imodium as needed.  Go ahead and take 1 Imodium at the onset of diarrhea.  If this does not continue to work well, please let us know and we can revisit Bentyl or Levsin.  We will plan to follow-up with you in August 2021.  Should you have questions or concerns prior, do not hesitate to call.  Aliene Altes, PA-C [facility]     Lactose-Free Diet, Adult If you have lactose intolerance, you are not able to digest lactose. Lactose is a natural sugar found mainly in dairy milk and dairy products. You may need to avoid all foods and beverages that contain lactose. A lactose-free diet can help you do this. Which foods have lactose? Lactose is found in dairy milk and dairy products, such as:  Yogurt.  Cheese.  Butter.  Margarine.  Sour cream.  Cream.  Whipped toppings and nondairy creamers.  Ice cream and other dairy-based desserts. Lactose is also found in foods or products made with dairy milk or milk ingredients. To find out whether a food contains dairy milk or a milk ingredient, look at the ingredients list. Avoid foods with the statement "May contain milk" and foods that contain:  Milk powder.  Whey.  Curd.  Caseinate.  Lactose.  Lactalbumin.  Lactoglobulin. What are alternatives to dairy milk and foods made with milk products?  Lactose-free milk.  Soy milk with added calcium and vitamin  D.  Almond milk, coconut milk, rice milk, or other nondairy milk alternatives with added calcium and vitamin D. Note that these are low in protein.  Soy products, such as soy yogurt, soy cheese, soy ice cream, and soy-based sour cream.  Other nut milk products, such as almond yogurt, almond cheese, cashew yogurt, cashew cheese, cashew ice cream, coconut yogurt, and coconut ice cream. What are tips for following this plan?  Do not consume foods, beverages, vitamins, minerals, or medicines containing lactose. Read ingredient lists carefully.  Look for the words "lactose-free" on labels.  Use lactase enzyme drops or tablets as directed by your health care provider.  Use lactose-free milk or a milk alternative, such as soy milk or almond milk, for drinking and cooking.  Make sure you get enough calcium and vitamin D in your diet. A lactose-free eating plan can be lacking in these important nutrients.  Take calcium and vitamin D supplements as directed by your health care provider. Talk to your health care provider about supplements if you are not able to get enough calcium and vitamin D from food. What foods can I eat?  Fruits All fresh, canned, frozen, or dried fruits that are not processed with lactose. Vegetables All fresh, frozen, and canned vegetables without cheese, cream, or butter sauces. Grains Any that are not made with dairy milk or dairy products. Meats and other proteins Any meat, fish, poultry, and other protein sources that are not made with dairy milk or dairy products. Soy cheese and yogurt. Fats  and oils Any that are not made with dairy milk or dairy products. Beverages Lactose-free milk. Soy, rice, or almond milk with added calcium and vitamin D. Fruit and vegetable juices. Sweets and desserts Any that are not made with dairy milk or dairy products. Seasonings and condiments Any that are not made with dairy milk or dairy products. Calcium Calcium is found in many  foods that contain lactose and is important for bone health. The amount of calcium you need depends on your age:  Adults younger than 50 years: 1,000 mg of calcium a day.  Adults older than 50 years: 1,200 mg of calcium a day. If you are not getting enough calcium, you may get it from other sources, including:  Orange juice with calcium added. There are 300-350 mg of calcium in 1 cup of orange juice.  Calcium-fortified soy milk. There are 300-400 mg of calcium in 1 cup of calcium-fortified soy milk.  Calcium-fortified rice or almond milk. There are 300 mg of calcium in 1 cup of calcium-fortified rice or almond milk.  Calcium-fortified breakfast cereals. There are 100-1,000 mg of calcium in calcium-fortified breakfast cereals.  Spinach, cooked. There are 145 mg of calcium in  cup of cooked spinach.  Edamame, cooked. There are 130 mg of calcium in  cup of cooked edamame.  Collard greens, cooked. There are 125 mg of calcium in  cup of cooked collard greens.  Kale, frozen or cooked. There are 90 mg of calcium in  cup of cooked or frozen kale.  Almonds. There are 95 mg of calcium in  cup of almonds.  Broccoli, cooked. There are 60 mg of calcium in 1 cup of cooked broccoli. The items listed above may not be a complete list of recommended foods and beverages. Contact a dietitian for more options. What foods are not recommended? Fruits None, unless they are made with dairy milk or dairy products. Vegetables None, unless they are made with dairy milk or dairy products. Grains Any grains that are made with dairy milk or dairy products. Meats and other proteins None, unless they are made with dairy milk or dairy products. Dairy All dairy products, including milk, goat's milk, buttermilk, kefir, acidophilus milk, flavored milk, evaporated milk, condensed milk, dulce de [place], eggnog, yogurt, cheese, and cheese spreads. Fats and oils Any that are made with milk or milk  products. Margarines and salad dressings that contain milk or cheese. Cream. Half and half. Cream cheese. Sour cream. Chip dips made with sour cream or yogurt. Beverages Hot chocolate. Cocoa with lactose. Instant iced teas. Powdered fruit drinks. Smoothies made with dairy milk or yogurt. Sweets and desserts Any that are made with milk or milk products. Seasonings and condiments Chewing gum that has lactose. Spice blends if they contain lactose. Artificial sweeteners that contain lactose. Nondairy creamers. The items listed above may not be a complete list of foods and beverages to avoid. Contact a dietitian for more information. Summary  If you are lactose intolerant, it means that you have a hard time digesting lactose, a natural sugar found in milk and milk products.  Following a lactose-free diet can help you manage this condition.  Calcium is important for bone health and is found in many foods that contain lactose. Talk with your health care provider about other sources of calcium. This information is not intended to replace advice given to you by your health care provider. Make sure you discuss any questions you have with your health care provider. Document Revised:  11/07/2017 Document Reviewed: 11/07/2017 Elsevier Patient Education  [place].

## 2019-11-05 ENCOUNTER — Other Ambulatory Visit: Payer: Self-pay | Admitting: Cardiovascular Disease

## 2019-11-05 ENCOUNTER — Encounter: Payer: Self-pay | Admitting: Gastroenterology

## 2019-11-05 NOTE — Assessment & Plan Note (Addendum)
69 year old male who reports a long history of IBS.  Symptoms include intermittent days of diarrhea with associated abdominal cramping.  He may go 2 weeks to a month without any symptoms then have 1-2 days of frequent watery stools/abdominal cramping that resolve with imodium. Recently started a probiotic. Reports being on Bentyl in the past which did not work well. Last episode of diarrhea about 1 week ago.  Between episodes of diarrhea, will have 1-2 soft formed BMs daily.  Admits to lactose intolerance but continues eating cheese intermittently.  Diarrhea also worsens with fatty meals. Gallbladder in situ. Denies bright red blood per rectum or melena.  Weight has been stable. Rare NSAIDs. Abdominal exam benign today. Last colonoscopy in 2011 with mild nonspecific erythematous mucosa in the descending colon biopsy, diverticulosis, internal hemorrhoids.  Pathology with focal active nonspecific colitis, main consideration included acute infectious colitis.  NSAID induced colitis and/or Crohn's in the differential but less likely. Recent labs on file with normal CBC.  As symptoms are intermittent and he is without any alarm symptoms, I suspect diarrhea is most likely related to dietary intolerances.  Specifically, lactose intolerance as he admits to this yet continues consuming dairy products.  May also have component of IBS.  Doubt IBD.  He was advised to follow a strict lactose-free diet or take Lactaid pills prior to consuming any dairy products. Handout provided.  Follow a low-fat/low-cholesterol diet.  Continue daily probiotic. Continue to use Imodium as needed.  Take 1 imodium at the onset of diarrhea, may take an extra if needed. He is to call us if imodium doesn't continue to work well for him and we could trial Levsin.  He is technically due for colonoscopy this year; however, due to COVID-19 pandemic, all screening procedures have been canceled at this time.  Plan to follow-up in August 2021.  Call if questions or concerns prior.

## 2019-11-05 NOTE — Assessment & Plan Note (Addendum)
Chronic.  Well-controlled on omeprazole 20 mg twice daily.  Due interaction between omeprazole and Plavix, will change omeprazole to Protonix 20 mg twice daily.  He was advised to call us if this does not work as well as omeprazole.  Plan to follow-up in August 2021.

## 2019-11-06 NOTE — Telephone Encounter (Signed)
This is a [place] pt, Dr. Koneswaran's pt °

## 2019-11-06 NOTE — Progress Notes (Signed)
Cc'ed to pcp °

## 2019-11-07 ENCOUNTER — Ambulatory Visit (INDEPENDENT_AMBULATORY_CARE_PROVIDER_SITE_OTHER): Payer: PPO | Admitting: *Deleted

## 2019-11-07 DIAGNOSIS — I255 Ischemic cardiomyopathy: Secondary | ICD-10-CM

## 2019-11-07 LAB — CUP PACEART REMOTE DEVICE CHECK
Battery Remaining Longevity: 44 mo
Battery Voltage: 2.98 V
Brady Statistic AP VP Percent: 0.02 %
Brady Statistic AP VS Percent: 15.11 %
Brady Statistic AS VP Percent: 0.04 %
Brady Statistic AS VS Percent: 84.83 %
Brady Statistic RA Percent Paced: 14.62 %
Brady Statistic RV Percent Paced: 0.07 %
Date Time Interrogation Session: 20210114012503
HighPow Impedance: 78 Ohm
Implantable Lead Implant Date: 20151008
Implantable Lead Implant Date: 20151008
Implantable Lead Location: 753859
Implantable Lead Location: 753860
Implantable Lead Model: 5076
Implantable Lead Model: 6935
Implantable Pulse Generator Implant Date: 20151008
Lead Channel Impedance Value: 304 Ohm
Lead Channel Impedance Value: 361 Ohm
Lead Channel Impedance Value: 456 Ohm
Lead Channel Pacing Threshold Amplitude: 1 V
Lead Channel Pacing Threshold Amplitude: 1 V
Lead Channel Pacing Threshold Pulse Width: 0.4 ms
Lead Channel Pacing Threshold Pulse Width: 0.4 ms
Lead Channel Sensing Intrinsic Amplitude: 2 mV
Lead Channel Sensing Intrinsic Amplitude: 2 mV
Lead Channel Sensing Intrinsic Amplitude: 7.125 mV
Lead Channel Sensing Intrinsic Amplitude: 7.125 mV
Lead Channel Setting Pacing Amplitude: 2 V
Lead Channel Setting Pacing Amplitude: 2.5 V
Lead Channel Setting Pacing Pulse Width: 0.4 ms
Lead Channel Setting Sensing Sensitivity: 0.3 mV

## 2019-11-08 ENCOUNTER — Ambulatory Visit (INDEPENDENT_AMBULATORY_CARE_PROVIDER_SITE_OTHER): Payer: PPO

## 2019-11-08 DIAGNOSIS — I5022 Chronic systolic (congestive) heart failure: Secondary | ICD-10-CM

## 2019-11-08 DIAGNOSIS — Z95810 Presence of automatic (implantable) cardiac defibrillator: Secondary | ICD-10-CM

## 2019-11-08 NOTE — Progress Notes (Signed)
ICD remote 

## 2019-11-08 NOTE — Progress Notes (Signed)
EPIC Encounter for ICM Monitoring  Patient Name: Paul [name] is a 69 y.o. male Date: 11/08/2019 Primary Care Physican: Chesley Noon, MD Primary 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  He was concerned the remote transmission results showed NSVT.   Explained NSVT and advised device clinic monitors for changes.  Advised Dr Rayann Heman reviews all remote transmissions and if he has any recommendations will call him back.  Confirmed he takes all medications and does not miss any dosages.    Optivol thoracic impedance normal.  Prescribed:Hydrochlorothiazide 12.5 mgTake 1 capsule (12.5 mg total) by mouth as needed (weight gain 3lbs or more in 24 hours 5lbs in a week or swelling).  Labs: 08/29/2019 Creatinine 1.16, BUN 9, Potassium 4.0, Sodium 141, GFR 64-74  Recommendations: No changes and encouraged to call if experiencing any fluid symptoms.  Follow-up plan: ICM clinic phone appointment on 12/09/2019. 91 day device clinic remote transmission 02/06/2020.   Copy of ICM check sent to Dr.Allred.  3 month ICM trend: 11/08/2019    1 Year ICM trend:       [name], RN 11/08/2019 12:17 PM

## 2019-11-08 NOTE — Telephone Encounter (Signed)
Spoke with patient. Reassured him that transmission revealed 1 brief NSVT episode (12 beats). No treated episodes, no episodes long enough to warrant therapy. All questions answered, pt denies additional concerns at this time.

## 2019-11-14 ENCOUNTER — Telehealth: Payer: Self-pay

## 2019-11-14 MED ORDER — NITROGLYCERIN 0.4 MG SL SUBL: 0.4000 mg | SUBLINGUAL_TABLET | SUBLINGUAL | 3 refills | Status: DC | PRN

## 2019-11-14 NOTE — Telephone Encounter (Signed)
Refilled NTG to [facility]

## 2019-12-05 DIAGNOSIS — E0590 Thyrotoxicosis, unspecified without thyrotoxic crisis or storm: Secondary | ICD-10-CM | POA: Diagnosis not present

## 2019-12-09 ENCOUNTER — Ambulatory Visit (INDEPENDENT_AMBULATORY_CARE_PROVIDER_SITE_OTHER): Payer: PPO

## 2019-12-09 DIAGNOSIS — I5022 Chronic systolic (congestive) heart failure: Secondary | ICD-10-CM

## 2019-12-09 DIAGNOSIS — Z95810 Presence of automatic (implantable) cardiac defibrillator: Secondary | ICD-10-CM

## 2019-12-13 NOTE — Progress Notes (Signed)
EPIC Encounter for ICM Monitoring  Patient Name: Paul [name] is a 69 y.o. male Date: 12/13/2019 Primary Care Physican: Chesley Noon, MD Primary [place] Electrophysiologist: Allred 12/13/2019 Weight: 186 - 187lbs   Spoke with patient and reports feeling well at this time.  Denies fluid symptoms.    Optivol thoracic impedance normal.  Prescribed:Hydrochlorothiazide 12.5 mgTake 1 capsule (12.5 mg total) by mouth as needed (weight gain 3lbs or more in 24 hours 5lbs in a week or swelling).  Labs: 08/29/2019 Creatinine 1.16, BUN 9, Potassium 4.0, Sodium 141, GFR 64-74  Recommendations:No changes and encouraged to call if experiencing any fluid symptoms.  Follow-up plan: ICM clinic phone appointment on3/22/2021. 91 day device clinic remote transmission 02/06/2020.   Copy of ICM check sent to Dr.Allred.  3 month ICM trend: 12/09/2019    1 Year ICM trend:       [name], RN 12/13/2019 11:35 AM

## 2019-12-25 ENCOUNTER — Encounter: Payer: Self-pay | Admitting: *Deleted

## 2020-01-03 ENCOUNTER — Other Ambulatory Visit: Payer: Self-pay

## 2020-01-03 MED ORDER — EPLERENONE 25 MG PO TABS: ORAL_TABLET | ORAL | 0 refills | Status: DC

## 2020-01-03 NOTE — Telephone Encounter (Signed)
Refilled inspra per MyChart request

## 2020-01-09 ENCOUNTER — Other Ambulatory Visit: Payer: Self-pay

## 2020-01-09 ENCOUNTER — Ambulatory Visit: Payer: PPO | Admitting: Physician Assistant

## 2020-01-09 ENCOUNTER — Encounter: Payer: Self-pay | Admitting: Physician Assistant

## 2020-01-09 DIAGNOSIS — L700 Acne vulgaris: Secondary | ICD-10-CM | POA: Diagnosis not present

## 2020-01-09 DIAGNOSIS — Z85828 Personal history of other malignant neoplasm of skin: Secondary | ICD-10-CM | POA: Diagnosis not present

## 2020-01-09 DIAGNOSIS — L309 Dermatitis, unspecified: Secondary | ICD-10-CM | POA: Diagnosis not present

## 2020-01-09 DIAGNOSIS — L570 Actinic keratosis: Secondary | ICD-10-CM | POA: Diagnosis not present

## 2020-01-09 DIAGNOSIS — L578 Other skin changes due to chronic exposure to nonionizing radiation: Secondary | ICD-10-CM | POA: Diagnosis not present

## 2020-01-09 DIAGNOSIS — L820 Inflamed seborrheic keratosis: Secondary | ICD-10-CM

## 2020-01-09 MED ORDER — FLUOROURACIL 5 % EX CREA: TOPICAL_CREAM | Freq: Every day | CUTANEOUS | 1 refills | Status: AC

## 2020-01-09 MED ORDER — TRIAMCINOLONE ACETONIDE 0.1 % EX CREA: 1.0000 "application " | TOPICAL_CREAM | Freq: Two times a day (BID) | CUTANEOUS | 2 refills | Status: DC | PRN

## 2020-01-09 NOTE — Progress Notes (Addendum)
   Follow up Visit  Subjective  Paul [name] is a 69 y.o. male who presents for the following: Follow-up (3 month follow up scalp pervious treatment 09/26/2019  cx3 [id number] ) and Psoriasis (per patient abdomen is itching treatment dermasil). 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 No roughness noted and wife feels like it looks good after surgery 09/26/2019. No new spots that are problematic. Pt. Does complain of itching all over especially on lower legs and trunk. This has been bothering him this winter. Feels that dry skin is contributing and has been using moisturizers like Dermasil, cerave and eucerin. He also likes to take hot showers.   Objective  Well appearing patient in no apparent distress; mood and affect are within normal limits.  All skin waist up examined. We also examined his lower legs. Pertinent findings in Assessment and Plan. No suspicious moles noted on back.  Objective  Right Parotid Area, Scalp: Erythematous patches with gritty scale.  Objective  Left Abdomen (side) - Upper, Left Lower Back, Left Lower Leg - Anterior, Mid Back, Right Abdomen (side) - Upper, Right Lower Back, Right Lower Leg - Anterior: Thin scaly erythematous papules coalescing to plaques.   Objective  Left Lower Back (2), Right Lower Back (2): Erythematous stuck-on, waxy papule or plaque.   Assessment & Plan  AK (actinic keratosis) (2) Right Parotid Area; Scalp  fluorouracil (EFUDEX) 5 % cream - Right Parotid Area, Scalp  Actinic keratosis  Ordered Medications: fluorouracil (EFUDEX) 5 % cream  Dermatitis (7) Left Lower Leg - Anterior; Right Lower Leg - Anterior; Left Abdomen (side) - Upper; Right Abdomen (side) - Upper; Left Lower Back; Right Lower Back; Mid Back  triamcinolone cream (KENALOG) 0.1 % - Left Abdomen (side) - Upper, Left Lower Back, Left Lower Leg - Anterior, Mid Back, Right Abdomen (side) - Upper, Right Lower Back, Right Lower Leg - Anterior  Inflamed seborrheic keratosis (4) Left Lower  Back (2); Right Lower Back (2)  Destruction of lesion - Left Lower Back (2), Right Lower Back (2) Complexity: simple   Destruction method: cryotherapy   Informed consent: discussed and consent obtained   Timeout:  patient name, date of birth, surgical site, and procedure verified Lesion destroyed using liquid nitrogen: Yes   Outcome: patient tolerated procedure well with no complications    Destruction of lesion - Left Lower Back (2), Right Lower Back (2) Complexity: simple   Destruction method: cryotherapy   Informed consent: discussed and consent obtained   Timeout:  patient name, date of birth, surgical site, and procedure verified Lesion destroyed using liquid nitrogen: Yes   Outcome: patient tolerated procedure well with no complications

## 2020-01-13 ENCOUNTER — Encounter: Payer: Self-pay | Admitting: Physician Assistant

## 2020-01-13 ENCOUNTER — Ambulatory Visit (INDEPENDENT_AMBULATORY_CARE_PROVIDER_SITE_OTHER): Payer: PPO

## 2020-01-13 DIAGNOSIS — Z95810 Presence of automatic (implantable) cardiac defibrillator: Secondary | ICD-10-CM

## 2020-01-13 DIAGNOSIS — I5022 Chronic systolic (congestive) heart failure: Secondary | ICD-10-CM | POA: Diagnosis not present

## 2020-01-15 ENCOUNTER — Telehealth: Payer: Self-pay

## 2020-01-15 NOTE — Telephone Encounter (Signed)
Phone call to patient to tell him that he can just call [facility] and ask them to cancel the 5FU prescription and have it sent to the [facility] in [place] so he is able to [place] the Good Rx Coupon.

## 2020-01-15 NOTE — Progress Notes (Signed)
EPIC Encounter for ICM Monitoring  Patient Name: Horus Bellofatto is a 69 y.o. male Date: 01/15/2020 Primary Care Physican: Chesley Noon, MD Primary [place] Electrophysiologist: Allred 12/13/2019 Weight: 186 - 187lbs   Attempted call to patient and unable to reach.  Left detailed message per DPR regarding transmission. Transmission reviewed.   Optivol thoracic impedance normal.  Prescribed:Hydrochlorothiazide 12.5 mgTake 1 capsule (12.5 mg total) by mouth as needed (weight gain 3lbs or more in 24 hours 5lbs in a week or swelling).  Labs: 08/29/2019 Creatinine 1.16, BUN 9, Potassium 4.0, Sodium 141, GFR 64-74  Recommendations:Left voice mail with ICM number and encouraged to call if experiencing any fluid symptoms.  Follow-up plan: ICM clinic phone appointment on4/26/2021. 91 day device clinic remote transmission4/15/2021.   Copy of ICM check sent to Dr.Allred.  3 month ICM trend: 01/13/2020    1 Year ICM trend:       [name], RN 01/15/2020 4:22 PM

## 2020-01-16 ENCOUNTER — Other Ambulatory Visit: Payer: Self-pay | Admitting: Internal Medicine

## 2020-01-29 DIAGNOSIS — N400 Enlarged prostate without lower urinary tract symptoms: Secondary | ICD-10-CM | POA: Diagnosis not present

## 2020-01-29 DIAGNOSIS — I2510 Atherosclerotic heart disease of native coronary artery without angina pectoris: Secondary | ICD-10-CM | POA: Diagnosis not present

## 2020-01-29 DIAGNOSIS — Z95810 Presence of automatic (implantable) cardiac defibrillator: Secondary | ICD-10-CM | POA: Diagnosis not present

## 2020-01-29 DIAGNOSIS — I10 Essential (primary) hypertension: Secondary | ICD-10-CM | POA: Diagnosis not present

## 2020-01-29 DIAGNOSIS — E538 Deficiency of other specified B group vitamins: Secondary | ICD-10-CM | POA: Diagnosis not present

## 2020-01-29 DIAGNOSIS — I5022 Chronic systolic (congestive) heart failure: Secondary | ICD-10-CM | POA: Diagnosis not present

## 2020-02-05 ENCOUNTER — Other Ambulatory Visit: Payer: Self-pay | Admitting: Cardiovascular Disease

## 2020-02-06 ENCOUNTER — Ambulatory Visit (INDEPENDENT_AMBULATORY_CARE_PROVIDER_SITE_OTHER): Payer: PPO | Admitting: *Deleted

## 2020-02-06 DIAGNOSIS — I255 Ischemic cardiomyopathy: Secondary | ICD-10-CM | POA: Diagnosis not present

## 2020-02-06 LAB — CUP PACEART REMOTE DEVICE CHECK
Battery Remaining Longevity: 46 mo
Battery Voltage: 2.97 V
Brady Statistic AP VP Percent: 0.06 %
Brady Statistic AP VS Percent: 19.64 %
Brady Statistic AS VP Percent: 0.04 %
Brady Statistic AS VS Percent: 80.26 %
Brady Statistic RA Percent Paced: 18.42 %
Brady Statistic RV Percent Paced: 0.11 %
Date Time Interrogation Session: 20210415033525
HighPow Impedance: 73 Ohm
Implantable Lead Implant Date: 20151008
Implantable Lead Implant Date: 20151008
Implantable Lead Location: 753859
Implantable Lead Location: 753860
Implantable Lead Model: 5076
Implantable Lead Model: 6935
Implantable Pulse Generator Implant Date: 20151008
Lead Channel Impedance Value: 285 Ohm
Lead Channel Impedance Value: 342 Ohm
Lead Channel Impedance Value: 456 Ohm
Lead Channel Pacing Threshold Amplitude: 0.875 V
Lead Channel Pacing Threshold Amplitude: 1 V
Lead Channel Pacing Threshold Pulse Width: 0.4 ms
Lead Channel Pacing Threshold Pulse Width: 0.4 ms
Lead Channel Sensing Intrinsic Amplitude: 1.75 mV
Lead Channel Sensing Intrinsic Amplitude: 1.75 mV
Lead Channel Sensing Intrinsic Amplitude: 6.875 mV
Lead Channel Sensing Intrinsic Amplitude: 6.875 mV
Lead Channel Setting Pacing Amplitude: 2 V
Lead Channel Setting Pacing Amplitude: 2.5 V
Lead Channel Setting Pacing Pulse Width: 0.4 ms
Lead Channel Setting Sensing Sensitivity: 0.3 mV

## 2020-02-06 NOTE — Telephone Encounter (Signed)
This is a [place] pt.  °

## 2020-02-07 NOTE — Progress Notes (Signed)
ICD Remote  

## 2020-02-13 ENCOUNTER — Other Ambulatory Visit: Payer: Self-pay

## 2020-02-13 MED ORDER — ATORVASTATIN CALCIUM 80 MG PO TABS: ORAL_TABLET | ORAL | 1 refills | Status: DC

## 2020-02-13 NOTE — Telephone Encounter (Signed)
Refilled Lipitor ,messaged patient he needs f/u apt

## 2020-02-17 ENCOUNTER — Ambulatory Visit (INDEPENDENT_AMBULATORY_CARE_PROVIDER_SITE_OTHER): Payer: PPO

## 2020-02-17 DIAGNOSIS — Z95810 Presence of automatic (implantable) cardiac defibrillator: Secondary | ICD-10-CM

## 2020-02-17 DIAGNOSIS — I5022 Chronic systolic (congestive) heart failure: Secondary | ICD-10-CM

## 2020-02-19 NOTE — Progress Notes (Signed)
EPIC Encounter for ICM Monitoring  Patient Name: Paul [name] is a 69 y.o. male Date: 02/19/2020 Primary Care Physican: Chesley Noon, MD Primary [place] Electrophysiologist: Allred 4/28/2021Weight: 190lbs   Spoke with patient and reports feeling well at this time.  Denies fluid symptoms.    Optivol thoracic impedance normal.  Prescribed:Hydrochlorothiazide 12.5 mgTake 1 capsule (12.5 mg total) by mouth as needed (weight gain 3lbs or more in 24 hours 5lbs in a week or swelling).  Labs: 08/29/2019 Creatinine 1.16, BUN 9, Potassium 4.0, Sodium 141, GFR 64-74  Recommendations:No changes and encouraged to call if experiencing any fluid symptoms.  Follow-up plan: ICM clinic phone appointment on6/10/2019. 91 day device clinic remote transmission7/15/2021. Virtual visit scheduled 02/25/2020 with Bronson Ing.  Copy of ICM check sent to Dr.Allred.  3 month ICM trend: 02/17/2020    1 Year ICM trend:       [name], RN 02/19/2020 10:03 AM

## 2020-02-25 ENCOUNTER — Encounter (HOSPITAL_COMMUNITY): Payer: Self-pay

## 2020-02-25 ENCOUNTER — Encounter: Payer: Self-pay | Admitting: Cardiovascular Disease

## 2020-02-25 ENCOUNTER — Telehealth (INDEPENDENT_AMBULATORY_CARE_PROVIDER_SITE_OTHER): Payer: PPO | Admitting: Cardiovascular Disease

## 2020-02-25 VITALS — Ht 68.0 in | Wt 190.0 lb

## 2020-02-25 DIAGNOSIS — Z951 Presence of aortocoronary bypass graft: Secondary | ICD-10-CM

## 2020-02-25 DIAGNOSIS — I472 Ventricular tachycardia, unspecified: Secondary | ICD-10-CM

## 2020-02-25 DIAGNOSIS — E785 Hyperlipidemia, unspecified: Secondary | ICD-10-CM

## 2020-02-25 DIAGNOSIS — I5022 Chronic systolic (congestive) heart failure: Secondary | ICD-10-CM

## 2020-02-25 DIAGNOSIS — Z95810 Presence of automatic (implantable) cardiac defibrillator: Secondary | ICD-10-CM | POA: Diagnosis not present

## 2020-02-25 DIAGNOSIS — I25708 Atherosclerosis of coronary artery bypass graft(s), unspecified, with other forms of angina pectoris: Secondary | ICD-10-CM | POA: Diagnosis not present

## 2020-02-25 NOTE — Patient Instructions (Signed)
Medication Instructions: Your physician recommends that you continue on your current medications as directed. Please refer to the Current Medication list given to you today.   Labwork: None today  Procedures/Testing: None today  Follow-Up: 6 months office visit with Dr.Koneswaran  Any Additional Special Instructions Will Be Listed Below (If Applicable).     If you need a refill on your cardiac medications before your next appointment, please call your pharmacy.      Thank you for choosing [facility] !

## 2020-02-25 NOTE — Progress Notes (Signed)
Virtual Visit via Telephone Note   This visit type was conducted due to national recommendations for restrictions regarding the COVID-19 Pandemic (e.g. social distancing) in an effort to limit this patient's exposure and mitigate transmission in our community.  Due to his co-morbid illnesses, this patient is at least at moderate risk for complications without adequate follow up.  This format is felt to be most appropriate for this patient at this time.  The patient did not have access to video technology/had technical difficulties with video requiring transitioning to audio format only (telephone).  All issues noted in this document were discussed and addressed.  No physical exam could be performed with this format.  Please refer to the patient's chart for his  consent to telehealth for [facility].   The patient was identified using 2 identifiers.  Date:  02/25/2020   ID:  Cheri Kearns, DOB [birth year], MRN [id number]  Patient Location: Home Provider Location: Office  PCP:  Chesley Noon, MD  Cardiologist:  Kate Sable, MD  Electrophysiologist:  None   Evaluation Performed:  Follow-Up Visit  Chief Complaint:  CAD  History of Present Illness:    Paul [name] is a 69 y.o. male with  a history of coronary artery disease and CABG, chronic systolic heart failure, ventricular tachycardia, and has a defibrillator.  He initially underwent CABG in 1989.  His last cardiac catheterization in 2015 showed patent LIMA to LAD and 3 patent saphenous vein grafts.  Echocardiogram on 03/28/2019 demonstrated severely reduced left ventricular systolic function, LVEF 30 to 35%, wall motion abnormalities, severe left ventricular dilatation, mild to moderate left atrial dilatation.  Nuclear stress test on 03/28/2019 demonstrated prior myocardial infarction with no ischemic territories.  Calculated LVEF 26%.  The patient denies any symptoms of chest pain, shortness of breath,  lightheadedness, dizziness, leg swelling, orthopnea, PND, and syncope.  He recently bought some furniture for their new home.  He's not been exercising and put on some weight.   Social history: His wife, Neoma Laming, is also my patient.  She underwent an atrial fibrillation ablation by Dr. Rayann Heman.  Past Medical History:  Diagnosis Date  . Basal cell carcinoma 07/17/2017   nod-L cheek (CX35FU)  . BPH (benign prostatic hyperplasia)   . CAD (coronary artery disease)    widely patent LAD LIMA graft, widely patent PDA PLR SVG, occluded Diag 1 OM 1 SVG by cardiac CT 2007   . CHF (congestive heart failure) ([place])    ECHO 03/2019: EF 30-35%  . GERD (gastroesophageal reflux disease)   . Hyperlipidemia   . IBS (irritable bowel syndrome)   . 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 Ischemic cardiomyopathy    Prior inferior infarct EF 35% by ECHO 07/13/11 ; ECHO 03/2019: EF 30-35%  . S/P CABG (coronary artery bypass graft)    CABG 06/1998 [place], [place] with LIMA to LAD, SVG to OM, SVG to PD-PL.   OM graft occluded 10/12/2005 by CTA   . SCC (squamous cell carcinoma) 11/11/2016   in situ- R anti helix (CX35FU), well diff-R upper crust of helix (CX35FU)  . Sleep apnea   . Ventricular tachycardia ([place]) 10/15   VT at 188 bpm- unstable requiring external defibrillation   Past Surgical History:  Procedure Laterality Date  . APPENDECTOMY    . COLONOSCOPY  06/11/2010   [place]; mild nonspecific erythematous mucosa in the descending colon s/p biopsied, diverticulosis, internal hemorrhoids.  Pathology with focal active colitis, nonspecific, main consideration includes  acute infectious colitis.  NSAID induced colitis and/or Crohn's disease also in the differential but less likely.  . CORONARY ARTERY BYPASS GRAFT  1999  . IMPLANTABLE CARDIOVERTER DEFIBRILLATOR IMPLANT N/A 07/31/2014   MDT Gwyneth Revels XT DR ICD implanted by Dr Rayann Heman for secondary treatent of VT  . LEFT HEART  CATHETERIZATION WITH CORONARY/GRAFT ANGIOGRAM N/A 07/30/2014   Procedure: LEFT HEART CATHETERIZATION WITH Beatrix Fetters;  Surgeon: Jacolyn Reedy, MD;  Location: [facility] CATH LAB;  Service: Cardiovascular;  Laterality: N/A;     Current Meds  Medication Sig  . Ascorbic Acid (VITAMIN C) 1000 MG tablet Take 1,000 mg by mouth daily.  Marland Kitchen atorvastatin (LIPITOR) 80 MG tablet TAKE (1) TABLET BY MOUTH AT BEDTIME.  Marland Kitchen azelastine (OPTIVAR) 0.05 % ophthalmic solution Place 1 drop into both eyes as needed.   . cetirizine (ZYRTEC) 10 MG tablet Take 10 mg by mouth daily.  . clopidogrel (PLAVIX) 75 MG tablet Take 1 tablet (75 mg total) by mouth daily.  . Coenzyme Q10 50 MG CAPS Take 1 capsule by mouth daily.  Marland Kitchen eplerenone (INSPRA) 25 MG tablet TAKE (1) TABLET BY MOUTH ONCE DAILY.  . fenofibrate 160 MG tablet Take 160 mg by mouth daily.  . fluticasone (FLONASE) 50 MCG/ACT nasal spray Place 2 sprays into both nostrils daily. (Patient taking differently: Place 2 sprays into both nostrils as needed. )  . folic acid (FOLVITE) Q000111Q MCG tablet Take 800 mcg by mouth daily.   . hydrochlorothiazide (MICROZIDE) 12.5 MG capsule Take 1 capsule (12.5 mg total) by mouth as needed (weight gain 3lbs or more in 24 hours 5lbs in a week or swelling).  Marland Kitchen ibuprofen (ADVIL) 400 MG tablet Take 1 tablet (400 mg total) by mouth every 6 (six) hours as needed. Take with food  . Melatonin 5 MG TABS Take by mouth.  . metoprolol succinate (TOPROL-XL) 50 MG 24 hr tablet Take 1 tablet (50 mg total) by mouth daily. Take with or immediately following a meal.  . nitroGLYCERIN (NITROSTAT) 0.4 MG SL tablet Place 1 tablet (0.4 mg total) under the tongue as needed for chest pain.  . Omega-3 Fatty Acids (FISH OIL) 1000 MG CAPS Take 1 capsule by mouth daily.  . pantoprazole (PROTONIX) 20 MG tablet Take 1 tablet (20 mg total) by mouth 2 (two) times daily.  . Probiotic Product (DAILY PROBIOTIC PO) Take by mouth daily.  . ramipril (ALTACE) 10 MG  capsule TAKE (1) CAPSULE BY MOUTH ONCE DAILY.  Marland Kitchen triamcinolone cream (KENALOG) 0.1 % Apply 1 application topically 2 (two) times daily as needed.  . Vitamin D, Cholecalciferol, 25 MCG (1000 UT) CAPS Take by mouth.  . Vitamin E 400 UNITS TABS Take 400 Units by mouth daily.   . [DISCONTINUED] sotalol (BETAPACE) 80 MG tablet TAKE (1) TABLET BY MOUTH TWICE DAILY.     Allergies:   Carvedilol, Escitalopram oxalate, Niacin and related, and Spironolactone   Social History   Tobacco Use  . Smoking status: Never Smoker  . Smokeless tobacco: Never Used  Substance Use Topics  . Alcohol use: Yes    Alcohol/week: 2.0 standard drinks    Types: 2 Glasses of wine per week    Comment: Couple beers a day and wine with dinner.   . Drug use: No     Family Hx: The patient's family history includes Cerebral aneurysm in his father; Dementia in his mother; Pneumonia in his brother; Stroke in his maternal aunt. There is no history of Diabetes, Heart attack, Hypertension,  or Colon cancer.  ROS:   Please see the history of present illness.     All other systems reviewed and are negative.   Prior CV studies:   The following studies were reviewed today:  Reviewed above  Labs/Other Tests and Data Reviewed:    EKG:  No ECG reviewed.  Recent Labs: 08/29/2019: BUN 9; Creatinine, Ser 1.16; Magnesium 1.5; Potassium 4.0; Sodium 141 09/16/2019: Hemoglobin 15.5; Platelets 157   Recent Lipid Panel Lab Results  Component Value Date/Time   CHOL 128 11/11/2016 03:13 PM   TRIG 80.0 11/11/2016 03:13 PM   HDL 61.00 11/11/2016 03:13 PM   CHOLHDL 2 11/11/2016 03:13 PM   LDLCALC 51 11/11/2016 03:13 PM    Wt Readings from Last 3 Encounters:  02/25/20 190 lb (86.2 kg)  11/04/19 197 lb 3.2 oz (89.4 kg)  09/16/19 194 lb 4.8 oz (88.1 kg)     Objective:    Vital Signs:  Ht 5\' 8"  (1.727 m)   Wt 190 lb (86.2 kg)   BMI 28.89 kg/m    VITAL SIGNS:  reviewed  ASSESSMENT & PLAN:    1.  Coronary artery  disease: Nuclear stress test in June 2020 showed no ischemic territories.  Last cardiac catheterization in 2015 showed patent LIMA to the LAD and 3 patent saphenous vein grafts. He denies anginal symptoms.  He is on clopidogrel, metoprolol succinate, and statin therapy.  He plans to join a local gym soon. He would like to get back to being 180 lbs.  2.  Hyperlipidemia: Continue atorvastatin 80 mg.    3.  Ventricular tachycardia: He has a defibrillator.  He is on metoprolol succinate and sotalol.  He follows with Dr. Rayann Heman. Normal device function by interrogation on 02/06/2020.  4.  Chronic systolic heart failure: LVEF 30 to 35% by echocardiogram in June 2020.  He is on metoprolol succinate, eplerenone, and ramipril.  He is on a thiazide diuretic. Thoracic impedance was normal on 02/17/2020.    COVID-19 Education: The signs and symptoms of COVID-19 were discussed with the patient and how to seek care for testing (follow up with PCP or arrange E-visit).  The importance of social distancing was discussed today.  Time:   Today, I have spent 20 minutes with the patient with telehealth technology discussing the above problems.     Medication Adjustments/Labs and Tests Ordered: Current medicines are reviewed at length with the patient today.  Concerns regarding medicines are outlined above.   Tests Ordered: No orders of the defined types were placed in this encounter.   Medication Changes: No orders of the defined types were placed in this encounter.   Follow Up:  In Person in 6 month(s)  Signed, Kate Sable, MD  02/25/2020 12:53 PM    [facility]

## 2020-02-26 ENCOUNTER — Ambulatory Visit: Payer: PPO | Admitting: Student

## 2020-02-26 ENCOUNTER — Other Ambulatory Visit: Payer: Self-pay

## 2020-02-26 ENCOUNTER — Encounter: Payer: Self-pay | Admitting: Student

## 2020-02-26 VITALS — BP 118/60 | HR 60 | Ht 68.0 in | Wt 201.4 lb

## 2020-02-26 DIAGNOSIS — Z79899 Other long term (current) drug therapy: Secondary | ICD-10-CM | POA: Diagnosis not present

## 2020-02-26 DIAGNOSIS — I255 Ischemic cardiomyopathy: Secondary | ICD-10-CM

## 2020-02-26 DIAGNOSIS — I25810 Atherosclerosis of coronary artery bypass graft(s) without angina pectoris: Secondary | ICD-10-CM | POA: Diagnosis not present

## 2020-02-26 DIAGNOSIS — Z95810 Presence of automatic (implantable) cardiac defibrillator: Secondary | ICD-10-CM

## 2020-02-26 LAB — CUP PACEART INCLINIC DEVICE CHECK
Battery Remaining Longevity: 45 mo
Battery Voltage: 2.94 V
Brady Statistic AP VP Percent: 0.04 %
Brady Statistic AP VS Percent: 16.88 %
Brady Statistic AS VP Percent: 0.05 %
Brady Statistic AS VS Percent: 83.03 %
Brady Statistic RA Percent Paced: 16.03 %
Brady Statistic RV Percent Paced: 0.09 %
Date Time Interrogation Session: 20210505131100
HighPow Impedance: 69 Ohm
Implantable Lead Implant Date: 20151008
Implantable Lead Implant Date: 20151008
Implantable Lead Location: 753859
Implantable Lead Location: 753860
Implantable Lead Model: 5076
Implantable Lead Model: 6935
Implantable Pulse Generator Implant Date: 20151008
Lead Channel Impedance Value: 285 Ohm
Lead Channel Impedance Value: 361 Ohm
Lead Channel Impedance Value: 456 Ohm
Lead Channel Pacing Threshold Amplitude: 0.875 V
Lead Channel Pacing Threshold Amplitude: 1 V
Lead Channel Pacing Threshold Pulse Width: 0.4 ms
Lead Channel Pacing Threshold Pulse Width: 0.4 ms
Lead Channel Sensing Intrinsic Amplitude: 1.5 mV
Lead Channel Sensing Intrinsic Amplitude: 2 mV
Lead Channel Sensing Intrinsic Amplitude: 6 mV
Lead Channel Sensing Intrinsic Amplitude: 7.875 mV
Lead Channel Setting Pacing Amplitude: 2 V
Lead Channel Setting Pacing Amplitude: 2.5 V
Lead Channel Setting Pacing Pulse Width: 0.4 ms
Lead Channel Setting Sensing Sensitivity: 0.3 mV

## 2020-02-26 NOTE — Progress Notes (Signed)
Electrophysiology Office Note Date: 02/26/2020  ID:  Quintavious, Nauss [birth year], MRN [id number]  PCP: Chesley Noon, MD Primary Cardiologist: Kate Sable, MD Electrophysiologist: Thompson Grayer, MD   CC: Routine ICD follow-up  Achyuth Wilbourne is a 69 y.o. male seen today for Thompson Grayer, MD for barostim consideration.  Since last being seen in our clinic the patient reports doing very well overall. he denies chest pain, palpitations, dyspnea, PND, orthopnea, nausea, vomiting, dizziness, syncope, edema, weight gain, or early satiety. He has not had ICD shocks. He has no symptoms of dyspnea on exertion at this time.   Device History: Medtronic Single Chamber ICD implanted 2015 for VT History of appropriate therapy: Yes History of AAD therapy: Yes; currently on sotalol   Past Medical History:  Diagnosis Date  . Basal cell carcinoma 07/17/2017   nod-L cheek (CX35FU)  . BPH (benign prostatic hyperplasia)   . CAD (coronary artery disease)    widely patent LAD LIMA graft, widely patent PDA PLR SVG, occluded Diag 1 OM 1 SVG by cardiac CT 2007   . CHF (congestive heart failure) ([place])    ECHO 03/2019: EF 30-35%  . GERD (gastroesophageal reflux disease)   . Hyperlipidemia   . IBS (irritable bowel syndrome)   . 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 Ischemic cardiomyopathy    Prior inferior infarct EF 35% by ECHO 07/13/11 ; ECHO 03/2019: EF 30-35%  . S/P CABG (coronary artery bypass graft)    CABG 06/1998 [place], [place] with LIMA to LAD, SVG to OM, SVG to PD-PL.   OM graft occluded 10/12/2005 by CTA   . SCC (squamous cell carcinoma) 11/11/2016   in situ- R anti helix (CX35FU), well diff-R upper crust of helix (CX35FU)  . Sleep apnea   . Ventricular tachycardia ([place]) 10/15   VT at 188 bpm- unstable requiring external defibrillation   Past Surgical History:  Procedure Laterality Date  . APPENDECTOMY    . COLONOSCOPY  06/11/2010   [place];  mild nonspecific erythematous mucosa in the descending colon s/p biopsied, diverticulosis, internal hemorrhoids.  Pathology with focal active colitis, nonspecific, main consideration includes acute infectious colitis.  NSAID induced colitis and/or Crohn's disease also in the differential but less likely.  . CORONARY ARTERY BYPASS GRAFT  1999  . IMPLANTABLE CARDIOVERTER DEFIBRILLATOR IMPLANT N/A 07/31/2014   MDT Gwyneth Revels XT DR ICD implanted by Dr Rayann Heman for secondary treatent of VT  . LEFT HEART CATHETERIZATION WITH CORONARY/GRAFT ANGIOGRAM N/A 07/30/2014   Procedure: LEFT HEART CATHETERIZATION WITH Beatrix Fetters;  Surgeon: Jacolyn Reedy, MD;  Location: [facility] CATH LAB;  Service: Cardiovascular;  Laterality: N/A;    Current Outpatient Medications  Medication Sig Dispense Refill  . Ascorbic Acid (VITAMIN C) 1000 MG tablet Take 1,000 mg by mouth daily.    Marland Kitchen atorvastatin (LIPITOR) 80 MG tablet TAKE (1) TABLET BY MOUTH AT BEDTIME. 90 tablet 1  . azelastine (OPTIVAR) 0.05 % ophthalmic solution Place 1 drop into both eyes as needed.     . cetirizine (ZYRTEC) 10 MG tablet Take 10 mg by mouth daily.    . clopidogrel (PLAVIX) 75 MG tablet Take 1 tablet (75 mg total) by mouth daily. 90 tablet 3  . Coenzyme Q10 50 MG CAPS Take 1 capsule by mouth daily.    . cyanocobalamin (,VITAMIN B-12,) 1000 MCG/ML injection Inject into the muscle every 30 (thirty) days.    Marland Kitchen eplerenone (INSPRA) 25 MG tablet TAKE (1) TABLET BY MOUTH ONCE  DAILY. 90 tablet 0  . fenofibrate 160 MG tablet Take 160 mg by mouth daily.    . fluticasone (FLONASE) 50 MCG/ACT nasal spray Place 2 sprays into both nostrils daily. 16 g 6  . folic acid (FOLVITE) Q000111Q MCG tablet Take 800 mcg by mouth daily.     . hydrochlorothiazide (MICROZIDE) 12.5 MG capsule Take 1 capsule (12.5 mg total) by mouth as needed (weight gain 3lbs or more in 24 hours 5lbs in a week or swelling). 30 capsule 1  . ibuprofen (ADVIL) 400 MG tablet Take 1 tablet (400 mg  total) by mouth every 6 (six) hours as needed. Take with food 30 tablet 0  . ipratropium (ATROVENT) 0.06 % nasal spray as needed.    . Melatonin 5 MG TABS Take by mouth.    . metoprolol succinate (TOPROL-XL) 50 MG 24 hr tablet Take 1 tablet (50 mg total) by mouth daily. Take with or immediately following a meal. 90 tablet 3  . nitroGLYCERIN (NITROSTAT) 0.4 MG SL tablet Place 1 tablet (0.4 mg total) under the tongue as needed for chest pain. 25 tablet 3  . Omega-3 Fatty Acids (FISH OIL) 1000 MG CAPS Take 1 capsule by mouth daily.    . pantoprazole (PROTONIX) 20 MG tablet Take 1 tablet (20 mg total) by mouth 2 (two) times daily. 60 tablet 11  . Probiotic Product (DAILY PROBIOTIC PO) Take by mouth daily.    . ramipril (ALTACE) 10 MG capsule TAKE (1) CAPSULE BY MOUTH ONCE DAILY. 90 capsule 0  . sildenafil (VIAGRA) 50 MG tablet Take 1 tablet (50 mg total) by mouth daily as needed for erectile dysfunction. 20 tablet 0  . SOTALOL AF 80 MG TABS 2 (two) times daily.    Marland Kitchen triamcinolone cream (KENALOG) 0.1 % Apply 1 application topically 2 (two) times daily as needed. 80 g 2  . Vitamin D, Cholecalciferol, 25 MCG (1000 UT) CAPS Take by mouth.    . Vitamin E 400 UNITS TABS Take 400 Units by mouth daily.      No current facility-administered medications for this visit.    Allergies:   Carvedilol, Escitalopram oxalate, Niacin and related, and Spironolactone   Social History: Social History   Socioeconomic History  . Marital status: Married    Spouse name: Neoma Laming  . Number of children: 2  . Years of education: Not on file  . Highest education level: Not on file  Occupational History  . Occupation: retired  Tobacco Use  . Smoking status: Never Smoker  . Smokeless tobacco: Never Used  Substance and Sexual Activity  . Alcohol use: Yes    Alcohol/week: 2.0 standard drinks    Types: 2 Glasses of wine per week    Comment: Couple beers a day and wine with dinner.   . Drug use: No  . Sexual  activity: Not on file  Other Topics Concern  . Not on file  Social History Narrative  . Not on file   Social Determinants of Health   Financial Resource Strain: Low Risk   . Difficulty of Paying Living Expenses: Not hard at all  Food Insecurity: No Food Insecurity  . Worried About Charity fundraiser in the Last Year: Never true  . Ran Out of Food in the Last Year: Never true  Transportation Needs: No Transportation Needs  . Lack of Transportation (Medical): No  . Lack of Transportation (Non-Medical): No  Physical Activity: Inactive  . Days of Exercise per Week: 0 days  .  Minutes of Exercise per Session: 0 min  Stress: No Stress Concern Present  . Feeling of Stress : Not at all  Social Connections: Slightly Isolated  . Frequency of Communication with Friends and Family: More than three times a week  . Frequency of Social Gatherings with Friends and Family: More than three times a week  . Attends Religious Services: 1 to 4 times per year  . Active Member of Clubs or Organizations: No  . Attends Archivist Meetings: Never  . Marital Status: Married  Human resources officer Violence: Not At Risk  . Fear of Current or Ex-Partner: No  . Emotionally Abused: No  . Physically Abused: No  . Sexually Abused: No    Family History: Family History  Problem Relation Age of Onset  . Dementia Mother   . Cerebral aneurysm Father   . Pneumonia Brother   . Stroke Maternal Aunt   . Diabetes Neg Hx   . Heart attack Neg Hx   . Hypertension Neg Hx   . Colon cancer Neg Hx     Review of Systems: All other systems reviewed and are otherwise negative except as noted above.   Physical Exam: Vitals:   02/26/20 1246  BP: 118/60  Pulse: 60  SpO2: 95%  Weight: 201 lb 6.4 oz (91.4 kg)  Height: 5\' 8"  (1.727 m)     GEN- The patient is well appearing, alert and oriented x 3 today.   HEENT: normocephalic, atraumatic; sclera clear, conjunctiva pink; hearing intact; oropharynx clear; neck  supple, no JVP Lymph- no cervical lymphadenopathy Lungs- Clear to ausculation bilaterally, normal work of breathing.  No wheezes, rales, rhonchi Heart- Regular rate and rhythm, no murmurs, rubs or gallops, PMI not laterally displaced GI- soft, non-tender, non-distended, bowel sounds present, no hepatosplenomegaly Extremities- no clubbing, cyanosis, or edema; DP/PT/radial pulses 2+ bilaterally MS- no significant deformity or atrophy Skin- warm and dry, no rash or lesion; ICD pocket well healed Psych- euthymic mood, full affect Neuro- strength and sensation are intact  ICD interrogation- reviewed in detail today,  See PACEART report  EKG:  EKG is ordered today. The ekg ordered today shows NSR at 60 bpm with QRS of 130 ms  Recent Labs: 08/29/2019: BUN 9; Creatinine, Ser 1.16; Magnesium 1.5; Potassium 4.0; Sodium 141 09/16/2019: Hemoglobin 15.5; Platelets 157   Wt Readings from Last 3 Encounters:  02/26/20 201 lb 6.4 oz (91.4 kg)  02/25/20 190 lb (86.2 kg)  11/04/19 197 lb 3.2 oz (89.4 kg)     Other studies Reviewed: Additional studies/ records that were reviewed today include: Previous office notes, Previous Echo, Previous EP office notes, most recent labwork, previous remote reports   Assessment and Plan:  1.  Chronic systolic dysfunction s/p Medtronic single chamber ICD  NYHA I symptoms euvolemic today Stable on an appropriate medical regimen Normal ICD function See Pace Art report No changes today  2. VT Well controlled with sotalol QTc stable on EKG today Repeat BMET/Mg today Did not tolerate oral magnesium Intolerant to amiodarone with thyroid dysfunction  3. OSA  Uses dental device  4. High risk medication maintenance Sotalol. Labs and EKG today.   Mr. Hodor is not a candidate for barostim at this time with clear NYHA 1 symptoms. He also has a borderline QRS of 130 ms in a LBBB pattern, that would likely lend him to CRT placement in the future if needed. Sotalol  surveillance labs today.  Current medicines are reviewed at length with the patient today.  The patient does not have concerns regarding his medicines.  The following changes were made today:  none  Labs/ tests ordered today include:  Orders Placed This Encounter  Procedures  . Basic Metabolic Panel (BMET)  . Magnesium  . Pro b natriuretic peptide (BNP)  . CUP PACEART [place]  . EKG 12-Lead    Disposition:   Follow up with EP APP in 6 months.    Jacalyn Lefevre, PA-C  02/26/2020 2:36 PM  [place] [address] [phone] (office) [phone] (fax)

## 2020-02-26 NOTE — Patient Instructions (Addendum)
Medication Instructions:  none *If you need a refill on your cardiac medications before your next appointment, please call your pharmacy*   Lab Work:  TODAY PRO BNP BMET MAGNESIUM If you have labs (blood work) drawn today and your tests are completely normal, you will receive your results only by: Marland Kitchen MyChart Message (if you have MyChart) OR . A paper copy in the mail If you have any lab test that is abnormal or we need to change your treatment, we will call you to review the results.   Testing/Procedures: none   Follow-Up: At [facility], you and your health needs are our priority.  As part of our continuing mission to provide you with exceptional heart care, we have created designated Provider Care Teams.  These Care Teams include your primary Cardiologist (physician) and Advanced Practice Providers (APPs -  Physician Assistants and Nurse Practitioners) who all work together to provide you with the care you need, when you need it.   Your next appointment:   6 MONTHS  The format for your next appointment:   Either In Person or Virtual  Provider:   Dr Rayann Heman   Other Instructions Remote monitoring is used to monitor your ICD from home. This monitoring reduces the number of office visits required to check your device to one time per year. It allows [place] to keep an eye on the functioning of your device to ensure it is working properly. You are scheduled for a device check from home on 05/07/20. You may send your transmission at any time that day. If you have a wireless device, the transmission will be sent automatically. After your physician reviews your transmission, you will receive a postcard with your next transmission date.

## 2020-02-27 LAB — BASIC METABOLIC PANEL
BUN/Creatinine Ratio: 8 — ABNORMAL LOW (ref 10–24)
BUN: 9 mg/dL (ref 8–27)
CO2: 25 mmol/L (ref 20–29)
Calcium: 9.6 mg/dL (ref 8.6–10.2)
Chloride: 104 mmol/L (ref 96–106)
Creatinine, Ser: 1.17 mg/dL (ref 0.76–1.27)
GFR calc Af Amer: 73 mL/min/{1.73_m2} (ref >59)
GFR calc non Af Amer: 63 mL/min/{1.73_m2} (ref >59)
Glucose: 95 mg/dL (ref 65–99)
Potassium: 5 mmol/L (ref 3.5–5.2)
Sodium: 142 mmol/L (ref 134–144)

## 2020-02-27 LAB — MAGNESIUM: Magnesium: 1.5 mg/dL — ABNORMAL LOW (ref 1.6–2.3)

## 2020-02-27 LAB — PRO B NATRIURETIC PEPTIDE: NT-Pro BNP: 987 pg/mL — ABNORMAL HIGH (ref 0–376)

## 2020-03-03 DIAGNOSIS — E538 Deficiency of other specified B group vitamins: Secondary | ICD-10-CM | POA: Diagnosis not present

## 2020-03-12 NOTE — Progress Notes (Signed)
Referring Provider: Chesley Noon, MD Primary Care Physician:  Chesley Noon, MD Primary GI Physician: Dr. Gala Romney  Chief Complaint  Patient presents with   Abdominal Pain    upper abdominal discomfort/bloating    HPI:   Paul [name] is a 69 y.o. male with past medical history of CAD, ischemic cardiomyopathy with EF of 30-35% in June 2020, V. tach in 2015 s/p ICD placement, HLD, IBS, GERD, and sleep apnea.  Last colonoscopy in 2011 with Dr. Michail Sermon with mild nonspecific erythematous mucosa in the descending colon, possibly ischemia s/p biopsied, diverticulosis, internal hemorrhoids.  Pathology with focal active nonspecific colitis, main consideration included acute infectious colitis versus NSAID induced colitis and/or Crohn's but less likely.  He presents today for upper abdominal discomfort/bloating.   Last seen in our office in January 2021 for GERD and diarrhea.  GERD was doing well on omeprazole 20 mg twice daily.  Due to interaction between omeprazole and Plavix, change omeprazole to Protonix 20 mg twice daily.  Regarding diarrhea, he reported a long history of IBS with intermittent days of frequent watery stools/abdominal cramping that resolved with Imodium.  Reported being on Bentyl in the past which did not work well.  Had recently started a probiotic.  Between episodes of diarrhea he had 1-2 formed stools daily.  Admitted to lactose intolerance but continued eating cheese intermittently.  Diarrhea also worse with fatty meals.  Gallbladder in situ.  Denied BRBPR, melena, or unintentional weight loss.  Recent CBC on file within normal limits.  With intermittent symptoms, suspected dietary intolerances, specifically lactose intolerance.  Could also have component of IBS.  Less likely IBD.  He was advised to follow a strict lactose-free diet or take Lactaid pills prior to any dairy products, follow a low-fat diet, continue daily probiotic, use Imodium as needed.  Advised to call  if Imodium did not continue to work for him and we could try Levsin.  He was technically due for repeat colonoscopy but due to COVID-19, screening procedures were canceled at that time.  Plan to follow-up in August 2021.  Reviewed recent office visit with cardiology 02/26/2020.  Patient reported he was doing well.  Denied weight gain.  No ICD shocks.  No chest pain or dyspnea or any other significant symptoms.  Stated patient was euvolemic.  Labs were ordered including BMP, magnesium, and BNP.  No medication changes were made.  BMP within normal limits, magnesium 1.5, proBNP elevated at 987.  Patient discussed findings with Erin Sons, PA-C with cardiology who stated it is normal to have elevated BNP and would not treat this without symptoms.  Today: Intermittent upper abdominal bloating/tightness. Feels constantly bloated/distended. Feels tight like it will pop. Up about 10 lbs. States it isn't because he is eating. Has decreased his intake. Symptoms started about 1 month ago. When bending over, it almost takes his breath due to pressure. Denies pain in the upper abdomen. Doesn't feel like he has gas. Not passing gas or burping frequently. Was taking probiotics daily. Stopped probiotic when abdominal discomfort started to see if this was causing his symptoms but no change. GERD is well controlled on Protonix 20 mg BID. BMs daily. No constipation. Occasional loose stools but this improved with following lactose free/low fat diet. No association between upper abdominal discomfort and changes in bowel habits.    States he has had similar symptoms in the past about 5-6 years ago. Had a CT and everything was clear. Eventually resolved on its  own.   No NSAIDs. No brbpr or melena. No N/V. Bloating/tightness worse after eating. Feels he is getting full quickly due to this. Also with lack of appetite. Denies swelling in LE. Has HCTZ as needed for swelling. Hasn't taken it in 1 month.   Some salads. No  broccoli, cabbage, cauliflower. Some beans. No soda.   Couple beer and wine daily.    Recent labs with PCP. Reports LFTs were normal. LFTs had been elevated in the past secondary to amiodarone.    Past Medical History:  Diagnosis Date   Basal cell carcinoma 07/17/2017   nod-L cheek (CX35FU)   BPH (benign prostatic hyperplasia)    CAD (coronary artery disease)    widely patent LAD LIMA graft, widely patent PDA PLR SVG, occluded Diag 1 OM 1 SVG by cardiac CT 2007    CHF (congestive heart failure) ([place])    ECHO 03/2019: EF 30-35%   GERD (gastroesophageal reflux disease)    Hyperlipidemia    IBS (irritable bowel syndrome)    ICD (implantable cardioverter-defibrillator) battery depletion    Ischemic cardiomyopathy    Prior inferior infarct EF 35% by ECHO 07/13/11 ; ECHO 03/2019: EF 30-35%   S/P CABG (coronary artery bypass graft)    CABG 06/1998 [place] with LIMA to LAD, SVG to OM, SVG to PD-PL.   OM graft occluded 10/12/2005 by CTA    SCC (squamous cell carcinoma) 11/11/2016   in situ- R anti helix (CX35FU), well diff-R upper crust of helix (CX35FU)   Sleep apnea    Ventricular tachycardia ([place]) 10/15   VT at 188 bpm- unstable requiring external defibrillation    Past Surgical History:  Procedure Laterality Date   APPENDECTOMY     COLONOSCOPY  06/11/2010   [place]; mild nonspecific erythematous mucosa in the descending colon s/p biopsied, diverticulosis, internal hemorrhoids.  Pathology with focal active colitis, nonspecific, main consideration includes acute infectious colitis.  NSAID induced colitis and/or Crohn's disease also in the differential but less likely.   CORONARY ARTERY BYPASS GRAFT  1999   IMPLANTABLE CARDIOVERTER DEFIBRILLATOR IMPLANT N/A 07/31/2014   MDT Gwyneth Revels XT DR ICD implanted by Dr Rayann Heman for secondary treatent of VT   LEFT HEART CATHETERIZATION WITH CORONARY/GRAFT ANGIOGRAM N/A 07/30/2014   Procedure: LEFT HEART CATHETERIZATION WITH  Beatrix Fetters;  Surgeon: Jacolyn Reedy, MD;  [facility] C[facility];  Service: Cardiovascular;  Laterality: N/A;    Current Outpatient Medications  Medication Sig Dispense Refill   Ascorbic Acid (VITAMIN C) 1000 MG tablet Take 1,000 mg by mouth daily.     atorvastatin (LIPITOR) 80 MG tablet TAKE (1) TABLET BY MOUTH AT BEDTIME. 90 tablet 1   azelastine (OPTIVAR) 0.05 % ophthalmic solution Place 1 drop into both eyes as needed.      cetirizine (ZYRTEC) 10 MG tablet Take 10 mg by mouth daily.     clopidogrel (PLAVIX) 75 MG tablet Take 1 tablet (75 mg total) by mouth daily. 90 tablet 3   Coenzyme Q10 50 MG CAPS Take 1 capsule by mouth daily.     cyanocobalamin (,VITAMIN B-12,) 1000 MCG/ML injection Inject into the muscle every 30 (thirty) days.     eplerenone (INSPRA) 25 MG tablet TAKE (1) TABLET BY MOUTH ONCE DAILY. 90 tablet 0   fenofibrate 160 MG tablet Take 160 mg by mouth daily.     fluticasone (FLONASE) 50 MCG/ACT nasal spray Place 2 sprays into both nostrils daily. 16 g 6   folic acid (FOLVITE) Q000111Q MCG  tablet Take 800 mcg by mouth daily.      hydrochlorothiazide (MICROZIDE) 12.5 MG capsule Take 1 capsule (12.5 mg total) by mouth as needed (weight gain 3lbs or more in 24 hours 5lbs in a week or swelling). 30 capsule 1   ipratropium (ATROVENT) 0.06 % nasal spray as needed.     Melatonin 5 MG TABS Take by mouth.     metoprolol succinate (TOPROL-XL) 50 MG 24 hr tablet Take 1 tablet (50 mg total) by mouth daily. Take with or immediately following a meal. 90 tablet 3   nitroGLYCERIN (NITROSTAT) 0.4 MG SL tablet Place 1 tablet (0.4 mg total) under the tongue as needed for chest pain. 25 tablet 3   Omega-3 Fatty Acids (FISH OIL) 1000 MG CAPS Take 1 capsule by mouth daily.     pantoprazole (PROTONIX) 20 MG tablet Take 1 tablet (20 mg total) by mouth 2 (two) times daily. 60 tablet 11   ramipril (ALTACE) 10 MG capsule TAKE (1) CAPSULE BY MOUTH ONCE DAILY. 90 capsule 0     sildenafil (VIAGRA) 50 MG tablet Take 1 tablet (50 mg total) by mouth daily as needed for erectile dysfunction. 20 tablet 0   SOTALOL AF 80 MG TABS 2 (two) times daily.     triamcinolone cream (KENALOG) 0.1 % Apply 1 application topically 2 (two) times daily as needed. 80 g 2   Vitamin D, Cholecalciferol, 25 MCG (1000 UT) CAPS Take by mouth.     Vitamin E 400 UNITS TABS Take 400 Units by mouth daily.      Probiotic Product (DAILY PROBIOTIC PO) Take by mouth daily.     No current facility-administered medications for this visit.    Allergies as of 03/13/2020 - Review Complete 03/13/2020  Allergen Reaction Noted   Carvedilol  01/18/2012   Escitalopram oxalate  01/18/2012   Niacin and related  01/18/2012   Spironolactone  01/18/2012    Family History  Problem Relation Age of Onset   Dementia Mother    Cerebral aneurysm Father    Pneumonia Brother    Stroke Maternal Aunt    Diabetes Neg Hx    Heart attack Neg Hx    Hypertension Neg Hx    Colon cancer Neg Hx     Social History   Socioeconomic History   Marital status: Married    Spouse name: Neoma Laming   Number of children: 2   Years of education: Not on file   Highest education level: Not on file  Occupational History   Occupation: retired  Tobacco Use   Smoking status: Never Smoker   Smokeless tobacco: Never Used  Substance and Sexual Activity   Alcohol use: Yes    Alcohol/week: 2.0 standard drinks    Types: 2 Glasses of wine per week    Comment: Couple beers a day and wine with dinner.    Drug use: No   Sexual activity: Not on file  Other Topics Concern   Not on file  Social History Narrative   Not on file   Social Determinants of Health   Financial Resource Strain: Low Risk    Difficulty of Paying Living Expenses: Not hard at all  Food Insecurity: No Food Insecurity   Worried About Running Out of Food in the Last Year: Never true   [place] in the Last Year: Never  true  Transportation Needs: No Transportation Needs   Lack of Transportation (Medical): No   Lack of Transportation (Non-Medical): No  Physical Activity: Inactive   Days of Exercise per Week: 0 days   Minutes of Exercise per Session: 0 min  Stress: No Stress Concern Present   Feeling of Stress : Not at all  Social Connections: Slightly Isolated   Frequency of Communication with Friends and Family: More than three times a week   Frequency of Social Gatherings with Friends and Family: More than three times a week   Attends Religious Services: 1 to 4 times per year   Active Member of [employer] or Organizations: No   Attends Archivist Meetings: Never   Marital Status: Married    Review of Systems: Gen: Denies fever, chills, lightheadedness, dizziness, presyncope, syncope. CV: Denies chest pain or palpitations. Resp: Denies shortness of breath or cough. GI: See HPI Derm: Denies rash Heme: See HPI  Physical Exam: BP (!) 145/82    Pulse 62    Temp (!) 97.3 F (36.3 C) (Temporal)    Ht 5\' 8"  (1.727 m)    Wt 197 lb 9.6 oz (89.6 kg)    BMI 30.04 kg/m  General:   Alert and oriented. No distress noted. Pleasant and cooperative.  Head:  Normocephalic and atraumatic. Eyes:  Conjuctiva clear without scleral icterus. Heart:  S1, S2 present without murmurs appreciated. Lungs:  Clear to auscultation bilaterally. No wheezes, rales, or rhonchi. No distress.  Abdomen:  +BS, soft, and non-distended.  Very minimal tenderness to deep palpation in the epigastric area. No rebound or guarding. No HSM or masses noted.  Msk:  Symmetrical without gross deformities. Normal posture. Extremities: 1+ pitting edema up to knees.  Neurologic:  Alert and  oriented x4 Psych:  Normal mood and affect.

## 2020-03-13 ENCOUNTER — Ambulatory Visit (INDEPENDENT_AMBULATORY_CARE_PROVIDER_SITE_OTHER): Payer: PPO | Admitting: Gastroenterology

## 2020-03-13 ENCOUNTER — Encounter: Payer: Self-pay | Admitting: Gastroenterology

## 2020-03-13 ENCOUNTER — Other Ambulatory Visit: Payer: Self-pay

## 2020-03-13 VITALS — BP 145/82 | HR 62 | Temp 97.3°F | Ht 68.0 in | Wt 197.6 lb

## 2020-03-13 DIAGNOSIS — R1010 Upper abdominal pain, unspecified: Secondary | ICD-10-CM | POA: Diagnosis not present

## 2020-03-13 DIAGNOSIS — R635 Abnormal weight gain: Secondary | ICD-10-CM

## 2020-03-13 DIAGNOSIS — R109 Unspecified abdominal pain: Secondary | ICD-10-CM | POA: Insufficient documentation

## 2020-03-13 DIAGNOSIS — K219 Gastro-esophageal reflux disease without esophagitis: Secondary | ICD-10-CM

## 2020-03-13 DIAGNOSIS — R140 Abdominal distension (gaseous): Secondary | ICD-10-CM

## 2020-03-13 NOTE — Progress Notes (Unsigned)
us

## 2020-03-13 NOTE — Patient Instructions (Addendum)
We will arrange for you to have an ultrasound of your abdomen ASAP.  Hopefully this will be on Monday.  I will call you with results and further recommendations.  Continue taking Protonix 20 mg twice daily 30 minutes before breakfast and dinner.  Continue following a lactose-free and low-fat diet.  Avoid items that cause gas and bloating including broccoli, cauliflower, cabbage, Brussels sprouts, beans, carbonated beverages, artificial sweeteners, drinking through a straw, and chewing gum.   See bloating handout below.  I recommend you try taking your hydrochlorothiazide for couple of days to see if this helps at all as you do have mild swelling in the lower extremities.   Aliene Altes, PA-C [facility]   Abdominal Bloating When you have abdominal bloating, your abdomen may feel full, tight, or painful. It may also look bigger than normal or swollen (distended). Common causes of abdominal bloating include:  Swallowing air.  Constipation.  Problems digesting food.  Eating too much.  Irritable bowel syndrome. This is a condition that affects the large intestine.  Lactose intolerance. This is an inability to digest lactose, a natural sugar in dairy products.  Celiac disease. This is a condition that affects the ability to digest gluten, a protein found in some grains.  Gastroparesis. This is a condition that slows down the movement of food in the stomach and small intestine. It is more common in people with diabetes mellitus.  Gastroesophageal reflux disease (GERD). This is a digestive condition that makes stomach acid flow back into the esophagus.  Urinary retention. This means that the body is holding onto urine, and the bladder cannot be emptied all the way. Follow these instructions at home: Eating and drinking  Avoid eating too much.  Try not to swallow air while talking or eating.  Avoid eating while lying down.  Avoid these foods and  drinks: ? Foods that cause gas, such as broccoli, cabbage, cauliflower, and baked beans. ? Carbonated drinks. ? Hard candy. ? Chewing gum. Medicines  Take over-the-counter and prescription medicines only as told by your health care provider.  Take probiotic medicines. These medicines contain live bacteria or yeasts that can help digestion.  Take coated peppermint oil capsules. Activity  Try to exercise regularly. Exercise may help to relieve bloating that is caused by gas and relieve constipation. General instructions  Keep all follow-up visits as told by your health care provider. This is important. Contact a health care provider if:  You have nausea and vomiting.  You have diarrhea.  You have abdominal pain.  You have unusual weight loss or weight gain.  You have severe pain, and medicines do not help. Get help right away if:  You have severe chest pain.  You have trouble breathing.  You have shortness of breath.  You have trouble urinating.  You have darker urine than normal.  You have blood in your stools or have dark, tarry stools. Summary  Abdominal bloating means that the abdomen is swollen.  Common causes of abdominal bloating are swallowing air, constipation, and problems digesting food.  Avoid eating too much and avoid swallowing air.  Avoid foods that cause gas, carbonated drinks, hard candy, and chewing gum. This information is not intended to replace advice given to you by your health care provider. Make sure you discuss any questions you have with your health care provider. Document Revised: 01/28/2019 Document Reviewed: 11/11/2016 Elsevier Patient Education  [place].

## 2020-03-13 NOTE — Assessment & Plan Note (Signed)
Chronic.  Well-controlled on Protonix 20 mg twice daily.  Currently dealing with 1 month of upper abdominal tightness/bloating as discussed below.  He will continue Protonix 20 mg daily as this is working well for him.  Further evaluation of upper abdominal tightness/bloating addressed under abdominal pain.

## 2020-03-13 NOTE — Assessment & Plan Note (Addendum)
69 year old male with past medical history of CAD, ischemic cardiomyopathy with EF of 30-35% in June 2020, V. tach in 2015 s/p ICD placement, HLD, IBS, GERD, and sleep apnea presenting today with acute complaint of upper abdominal bloating/tightness x1 month.  Also reports 10 pound weight gain although per our records, he is actually the same weight he was when I saw him in January 2021. Early satiety/decreased appetite due to bloating. GERD symptoms are well controlled on Protonix 20 mg twice daily.  IBS symptoms well controlled with diet.  Denies abdominal pain, N/V, BRBPR, melena, or NSAID use.  No known history of cirrhosis.  Patient reports recent labs with PCP with LFTs within normal limits.  Interestingly, proBNP ordered by cardiology on 02/26/2020 was elevated at 987.  Abdominal exam essentially benign.  Very minimal tenderness to deep palpation in the epigastric area.  No appreciable abdominal distention.  Lower extremities with 1+ pitting edema up to the knees.  He takes hydrochlorothiazide as needed for peripheral edema and has not taken this for about 1 month as he has not appreciated increased peripheral edema.  Etiology is not clear.  With elevated proBNP, query whether patient has some fluid accumulation secondary to CHF contributing to his symptoms.  Symptoms are not classically biliary however, I have seen patients in the past present similarly with symptoms related to their gallbladder.  As he is on a PPI chronically, would not suspect gastritis but can't rule this out.  H. Pylori also on the differential.   Complete ultrasound abdomen on 03/16/2020. Counseled to avoid items that cause gas and bloating and handout was provided. Continue following a lactose-free/low-fat diet as this controls IBS symptoms well. Continue taking Protonix 20 mg twice daily. Continue to avoid NSAIDs. Recommend that he try taking hydrochlorothiazide for couple of days to see if this helps improve his symptoms as he  does have lower extremity pitting edema on exam today. Request recent labs from PCP.  Further recommendations to follow [place]. May need EGD in [place] is unrevealing.

## 2020-03-16 ENCOUNTER — Other Ambulatory Visit: Payer: Self-pay

## 2020-03-16 ENCOUNTER — Ambulatory Visit (HOSPITAL_COMMUNITY): Payer: PPO

## 2020-03-16 ENCOUNTER — Ambulatory Visit (HOSPITAL_COMMUNITY)
Admission: RE | Admit: 2020-03-16 | Discharge: 2020-03-16 | Disposition: A | Discharge status: Home / Self Care | Payer: PPO | Source: Ambulatory Visit | Attending: Gastroenterology | Admitting: Gastroenterology

## 2020-03-16 DIAGNOSIS — R140 Abdominal distension (gaseous): Secondary | ICD-10-CM

## 2020-03-16 DIAGNOSIS — R635 Abnormal weight gain: Secondary | ICD-10-CM | POA: Diagnosis not present

## 2020-03-16 DIAGNOSIS — R1010 Upper abdominal pain, unspecified: Secondary | ICD-10-CM

## 2020-03-16 NOTE — Progress Notes (Signed)
No significant findings on ultrasound to explain patient's upper abdominal bloating/tightness.  Gallbladder appears normal.  He does have evidence of fatty liver.  Otherwise, no significant findings on the ultrasound.   Did he try taking hydrochlorothiazide over the weekend and avoiding gas producing items as discussed at office visit?  How is he feeling today?  Instructions for fatty liver: Recommend 1-2# weight loss per week until ideal body weight through exercise & diet. Low fat/cholesterol diet.  Avoid sweets, sodas, fruit juices, sweetened beverages like tea, etc. Gradually increase exercise from 15 min daily up to 1 hr per day 5 days/week. Limit alcohol use.

## 2020-03-22 ENCOUNTER — Telehealth: Payer: Self-pay | Admitting: Gastroenterology

## 2020-03-22 NOTE — Telephone Encounter (Signed)
Received and reviewed recent labs completed with PCP dated 01/29/2020.  CMP: Glucose 91, creatinine 1.17, sodium 142, potassium 4.9, chloride 105, calcium 9.4, total protein 6.8, albumin 4.4, total bilirubin 1.0, alk phos 57, AST 29, ALT 17 PSA 0.8  LFTs within normal limits. Prior US 03/16/20 with fatty liver and instructions were given. No additional recommendations at this time. Will have results scanned into patients chart.  

## 2020-03-24 ENCOUNTER — Ambulatory Visit (INDEPENDENT_AMBULATORY_CARE_PROVIDER_SITE_OTHER): Payer: PPO

## 2020-03-24 DIAGNOSIS — I5022 Chronic systolic (congestive) heart failure: Secondary | ICD-10-CM | POA: Diagnosis not present

## 2020-03-24 DIAGNOSIS — Z95810 Presence of automatic (implantable) cardiac defibrillator: Secondary | ICD-10-CM | POA: Diagnosis not present

## 2020-03-24 NOTE — Progress Notes (Signed)
EPIC Encounter for ICM Monitoring  Patient Name: Paul [name] is a 69 y.o. male Date: 03/24/2020 Primary Care Physican: Chesley Noon, MD Primary [place] Electrophysiologist: Allred 4/28/2021Weight: 190lbs   Spoke with patient and reports feeling well at this time.  Denies fluid symptoms.  He tookHydrochlorothiazide in early May for fluid which correlates with decreased impedance  Optivol thoracic impedance normal.  Prescribed:Hydrochlorothiazide 12.5 mgTake 1 capsule (12.5 mg total) by mouth as needed (weight gain 3lbs or more in 24 hours 5lbs in a week or swelling).  Labs: 08/29/2019 Creatinine 1.16, BUN 9, Potassium 4.0, Sodium 141, GFR 64-74  Recommendations:No changes and encouraged to call if experiencing any fluid symptoms.  Follow-up plan: ICM clinic phone appointment on7/16/2021. 91 day device clinic remote transmission7/15/2021.   Copy of ICM check sent to Dr.Allred.  3 month ICM trend: 03/24/2020    1 Year ICM trend:       [name], RN 03/24/2020 5:01 PM

## 2020-04-02 DIAGNOSIS — E538 Deficiency of other specified B group vitamins: Secondary | ICD-10-CM | POA: Diagnosis not present

## 2020-04-24 ENCOUNTER — Ambulatory Visit: Payer: PPO | Admitting: Gastroenterology

## 2020-05-07 ENCOUNTER — Ambulatory Visit (INDEPENDENT_AMBULATORY_CARE_PROVIDER_SITE_OTHER): Payer: PPO | Admitting: *Deleted

## 2020-05-07 DIAGNOSIS — I472 Ventricular tachycardia, unspecified: Secondary | ICD-10-CM

## 2020-05-07 LAB — CUP PACEART REMOTE DEVICE CHECK
Battery Remaining Longevity: 42 mo
Battery Voltage: 2.97 V
Brady Statistic AP VP Percent: 0.01 %
Brady Statistic AP VS Percent: 10.46 %
Brady Statistic AS VP Percent: 0.05 %
Brady Statistic AS VS Percent: 89.48 %
Brady Statistic RA Percent Paced: 10.16 %
Brady Statistic RV Percent Paced: 0.06 %
Date Time Interrogation Session: 20210715022604
HighPow Impedance: 77 Ohm
Implantable Lead Implant Date: 20151008
Implantable Lead Implant Date: 20151008
Implantable Lead Location: 753859
Implantable Lead Location: 753860
Implantable Lead Model: 5076
Implantable Lead Model: 6935
Implantable Pulse Generator Implant Date: 20151008
Lead Channel Impedance Value: 285 Ohm
Lead Channel Impedance Value: 361 Ohm
Lead Channel Impedance Value: 456 Ohm
Lead Channel Pacing Threshold Amplitude: 1 V
Lead Channel Pacing Threshold Amplitude: 1.125 V
Lead Channel Pacing Threshold Pulse Width: 0.4 ms
Lead Channel Pacing Threshold Pulse Width: 0.4 ms
Lead Channel Sensing Intrinsic Amplitude: 1.625 mV
Lead Channel Sensing Intrinsic Amplitude: 1.625 mV
Lead Channel Sensing Intrinsic Amplitude: 5.75 mV
Lead Channel Sensing Intrinsic Amplitude: 5.75 mV
Lead Channel Setting Pacing Amplitude: 2.25 V
Lead Channel Setting Pacing Amplitude: 2.5 V
Lead Channel Setting Pacing Pulse Width: 0.4 ms
Lead Channel Setting Sensing Sensitivity: 0.3 mV

## 2020-05-08 ENCOUNTER — Ambulatory Visit (INDEPENDENT_AMBULATORY_CARE_PROVIDER_SITE_OTHER): Payer: PPO

## 2020-05-08 DIAGNOSIS — I5022 Chronic systolic (congestive) heart failure: Secondary | ICD-10-CM | POA: Diagnosis not present

## 2020-05-08 DIAGNOSIS — Z95810 Presence of automatic (implantable) cardiac defibrillator: Secondary | ICD-10-CM | POA: Diagnosis not present

## 2020-05-08 NOTE — Progress Notes (Signed)
EPIC Encounter for ICM Monitoring  Patient Name: Paul [name] is a 69 y.o. male Date: 05/08/2020 Primary Care Physican: Chesley Noon, MD Primary [place] Electrophysiologist: Allred 7/16/2021Weight:190lbs   Spoke with patient and reports feeling well at this time. Denies fluid symptoms.   Optivol thoracic impedance normal.  Prescribed:Hydrochlorothiazide 12.5 mgTake 1 capsule (12.5 mg total) by mouth as needed (weight gain 3lbs or more in 24 hours 5lbs in a week or swelling).  Labs: 02/26/2020 Creatinine 1.17, BUN 9, Potassium 5.0, Sodium 142, GFR 63-73  Recommendations:No changes and encouraged to call if experiencing any fluid symptoms.  Follow-up plan: ICM clinic phone appointment on 06/08/2020.   91 day device clinic remote transmission 08/06/2020.    EP/Cardiology Office Visits: 09/20/2020 with Dr. Bronson Ing.  Last EP visit 02/26/2020 with Oda Kilts, PA  Copy of ICM check sent to Dr. Rayann Heman.   3 month ICM trend: 05/07/2020    1 Year ICM trend:       Paul Billings, RN 05/08/2020 3:50 PM

## 2020-05-11 NOTE — Progress Notes (Signed)
Remote ICD transmission.   

## 2020-05-12 DIAGNOSIS — I10 Essential (primary) hypertension: Secondary | ICD-10-CM | POA: Diagnosis not present

## 2020-05-12 DIAGNOSIS — E7800 Pure hypercholesterolemia, unspecified: Secondary | ICD-10-CM | POA: Diagnosis not present

## 2020-05-12 DIAGNOSIS — N400 Enlarged prostate without lower urinary tract symptoms: Secondary | ICD-10-CM | POA: Diagnosis not present

## 2020-05-12 DIAGNOSIS — G4701 Insomnia due to medical condition: Secondary | ICD-10-CM | POA: Diagnosis not present

## 2020-05-12 DIAGNOSIS — Z95810 Presence of automatic (implantable) cardiac defibrillator: Secondary | ICD-10-CM | POA: Diagnosis not present

## 2020-05-12 DIAGNOSIS — E6609 Other obesity due to excess calories: Secondary | ICD-10-CM | POA: Diagnosis not present

## 2020-05-12 DIAGNOSIS — I25810 Atherosclerosis of coronary artery bypass graft(s) without angina pectoris: Secondary | ICD-10-CM | POA: Diagnosis not present

## 2020-05-12 DIAGNOSIS — K219 Gastro-esophageal reflux disease without esophagitis: Secondary | ICD-10-CM | POA: Diagnosis not present

## 2020-05-12 DIAGNOSIS — R109 Unspecified abdominal pain: Secondary | ICD-10-CM | POA: Diagnosis not present

## 2020-05-12 DIAGNOSIS — Z6830 Body mass index (BMI) 30.0-30.9, adult: Secondary | ICD-10-CM | POA: Diagnosis not present

## 2020-05-12 DIAGNOSIS — I5022 Chronic systolic (congestive) heart failure: Secondary | ICD-10-CM | POA: Diagnosis not present

## 2020-05-12 DIAGNOSIS — E538 Deficiency of other specified B group vitamins: Secondary | ICD-10-CM | POA: Diagnosis not present

## 2020-05-27 ENCOUNTER — Ambulatory Visit: Payer: Self-pay

## 2020-05-27 ENCOUNTER — Ambulatory Visit
Admission: EM | Admit: 2020-05-27 | Discharge: 2020-05-27 | Disposition: A | Discharge status: Home / Self Care | Payer: PPO | Attending: Emergency Medicine | Admitting: Emergency Medicine

## 2020-05-27 ENCOUNTER — Other Ambulatory Visit: Payer: Self-pay

## 2020-05-27 DIAGNOSIS — Z1152 Encounter for screening for COVID-19: Secondary | ICD-10-CM | POA: Diagnosis not present

## 2020-05-27 NOTE — ED Provider Notes (Signed)
[name]   [id number] 05/27/20 Arrival Time: 7408   CC: COVID symptoms  SUBJECTIVE: History from: patient.  Paul [name] is a 69 y.o. male who presents for COVID testing.  Denies sick exposure to COVID, flu or strep.  Denies recent travel.  Denies aggravating or alleviating symptoms.  Denies previous COVID infection.   Denies fever, chills, fatigue, nasal congestion, rhinorrhea, sore throat, cough, SOB, wheezing, chest pain, nausea, vomiting, changes in bowel or bladder habits.    ROS: As per HPI.  All other pertinent ROS negative.     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   OM graft occluded 10/12/2005 by CTA    SCC (squamous cell carcinoma) 11/11/2016   in situ- R anti helix (CX35FU), well diff-R upper crust of helix (CX35FU)   Sleep apnea    Ventricular tachycardia ([place]) 10/15   VT at 188 bpm- unstable requiring external defibrillation   Past Surgical History:  Procedure Laterality Date   APPENDECTOMY     COLONOSCOPY  06/11/2010   [place]; mild nonspecific erythematous mucosa in the descending colon s/p biopsied, diverticulosis, internal hemorrhoids.  Pathology with focal active colitis, nonspecific, main consideration includes acute infectious colitis.  NSAID induced colitis  and/or Crohn's disease also in the differential but less likely.   CORONARY ARTERY BYPASS GRAFT  1999   IMPLANTABLE CARDIOVERTER DEFIBRILLATOR IMPLANT N/A 07/31/2014   MDT Gwyneth Revels XT DR ICD implanted by Dr Rayann Heman for secondary treatent of VT   LEFT HEART CATHETERIZATION WITH CORONARY/GRAFT ANGIOGRAM N/A 07/30/2014   Procedure: LEFT HEART CATHETERIZATION WITH Beatrix Fetters;  Surgeon: Jacolyn Reedy, MD;  Location: [facility] CATH LAB;  Service: Cardiovascular;  Laterality: N/A;   Allergies  Allergen Reactions   Carvedilol     Fatigue    Escitalopram Oxalate     unknown   Niacin And Related     Flushing    Spironolactone     gynecomastia   No current facility-administered medications on file prior to encounter.   Current Outpatient Medications on File Prior to Encounter  Medication Sig Dispense Refill   Ascorbic Acid (VITAMIN C) 1000 MG tablet Take 1,000 mg by mouth daily.     atorvastatin (LIPITOR) 80 MG tablet TAKE (1) TABLET BY MOUTH AT BEDTIME. 90 tablet 1   azelastine (OPTIVAR) 0.05 % ophthalmic solution Place 1 drop into both eyes as needed.      cetirizine (ZYRTEC) 10 MG tablet Take 10 mg by mouth daily.     clopidogrel (PLAVIX) 75 MG tablet Take 1 tablet (75 mg total) by mouth daily. 90 tablet 3   Coenzyme Q10 50 MG CAPS Take 1 capsule by mouth daily.     eplerenone (INSPRA) 25 MG tablet  TAKE (1) TABLET BY MOUTH ONCE DAILY. 90 tablet 0   fenofibrate 160 MG tablet Take 160 mg by mouth daily.     fluticasone (FLONASE) 50 MCG/ACT nasal spray Place 2 sprays into both nostrils daily. 16 g 6   folic acid (FOLVITE) 580 MCG tablet Take 800 mcg by mouth daily.      hydrochlorothiazide (MICROZIDE) 12.5 MG capsule Take 1 capsule (12.5 mg total) by mouth as needed (weight gain 3lbs or more in 24 hours 5lbs in a week or swelling). 30 capsule 1   ipratropium (ATROVENT) 0.06 % nasal spray as needed.     Melatonin 5 MG TABS Take by mouth.     metoprolol succinate  (TOPROL-XL) 50 MG 24 hr tablet Take 1 tablet (50 mg total) by mouth daily. Take with or immediately following a meal. 90 tablet 3   nitroGLYCERIN (NITROSTAT) 0.4 MG SL tablet Place 1 tablet (0.4 mg total) under the tongue as needed for chest pain. 25 tablet 3   Omega-3 Fatty Acids (FISH OIL) 1000 MG CAPS Take 1 capsule by mouth daily.     pantoprazole (PROTONIX) 20 MG tablet Take 1 tablet (20 mg total) by mouth 2 (two) times daily. 60 tablet 11   Probiotic Product (DAILY PROBIOTIC PO) Take by mouth daily.     ramipril (ALTACE) 10 MG capsule TAKE (1) CAPSULE BY MOUTH ONCE DAILY. 90 capsule 0   sildenafil (VIAGRA) 50 MG tablet Take 1 tablet (50 mg total) by mouth daily as needed for erectile dysfunction. 20 tablet 0   SOTALOL AF 80 MG TABS 2 (two) times daily.     triamcinolone cream (KENALOG) 0.1 % Apply 1 application topically 2 (two) times daily as needed. 80 g 2   Vitamin D, Cholecalciferol, 25 MCG (1000 UT) CAPS Take by mouth.     Vitamin E 400 UNITS TABS Take 400 Units by mouth daily.      Social History   Socioeconomic History   Marital status: Married    Spouse name: Neoma Laming   Number of children: 2   Years of education: Not on file   Highest education level: Not on file  Occupational History   Occupation: retired  Tobacco Use   Smoking status: Never Smoker   Smokeless tobacco: Never Used  Scientific laboratory technician Use: Never used  Substance and Sexual Activity   Alcohol use: Yes    Alcohol/week: 2.0 standard drinks    Types: 2 Glasses of wine per week    Comment: Couple beers a day and wine with dinner.    Drug use: No   Sexual activity: Not on file  Other Topics Concern   Not on file  Social History Narrative   Not on file   Social Determinants of Health   Financial Resource Strain: Low Risk    Difficulty of Paying Living Expenses: Not hard at all  Food Insecurity: No Food Insecurity   Worried About Running Out of Food in the Last Year: Never true    [place] in the Last Year: Never true  Transportation Needs: No Transportation Needs   Lack of Transportation (Medical): No   Lack of Transportation (Non-Medical): No  Physical Activity: Inactive   Days of Exercise per Week: 0 days   Minutes of Exercise per Session: 0 min  Stress: No Stress Concern Present   Feeling of Stress : Not at all  Social Connections: Moderately Integrated   Frequency of Communication with Friends and Family:  More than three times a week   Frequency of Social Gatherings with Friends and Family: More than three times a week   Attends Religious Services: 1 to 4 times per year   Active Member of [employer] or Organizations: No   Attends Music therapist: Never   Marital Status: Married  Human resources officer Violence: Not At Risk   Fear of Current or Ex-Partner: No   Emotionally Abused: No   Physically Abused: No   Sexually Abused: No   Family History  Problem Relation Age of Onset   Dementia Mother    Cerebral aneurysm Father    Pneumonia Brother    Stroke Maternal Aunt    Diabetes Neg Hx    Heart attack Neg Hx    Hypertension Neg Hx    Colon cancer Neg Hx     OBJECTIVE:  Vitals:   05/27/20 1253  BP: (!) 157/87  Pulse: (!) 56  Resp: 16  Temp: 98.3 F (36.8 C)  TempSrc: Oral  SpO2: 97%     General appearance: alert; appears fatigued, but nontoxic; speaking in full sentences and tolerating own secretions HEENT: NCAT; Ears: EACs clear, TMs pearly gray; Eyes: PERRL.  EOM grossly intact. Sinuses: nontender; Nose: nares patent without rhinorrhea, Throat: oropharynx clear, tonsils non erythematous or enlarged, uvula midline  Neck: supple without LAD Lungs: unlabored respirations, symmetrical air entry; cough: absent; no respiratory distress; CTAB Heart: regular rate and rhythm.  Radial pulses 2+ symmetrical bilaterally Skin: warm and dry Psychological: alert and cooperative; normal mood and affect  LABS:  No  results found for this or any previous visit (from the past 24 hour(s)).   ASSESSMENT & PLAN:  1. Encounter for screening for COVID-19     No orders of the defined types were placed in this encounter.   Discharge instructions    COVID testing ordered.  It will take between 2-7 days for test results.  Someone will contact you regarding abnormal results.    In the meantime: You should remain isolated in your home for 10 days from symptom onset AND greater than 24 hours after symptoms resolution (absence of fever without the use of fever-reducing medication and improvement in respiratory symptoms), whichever is longer Get plenty of rest and push fluids Use medications daily for symptom relief Use OTC medications like ibuprofen or tylenol as needed fever or pain Call or go to the ED if you have any new or worsening symptoms such as fever, worsening cough, shortness of breath, chest tightness, chest pain, turning blue, changes in mental status, etc...   Reviewed expectations re: course of current medical issues. Questions answered. Outlined signs and symptoms indicating need for more acute intervention. Patient verbalized understanding. After Visit Summary given.     Note: This document was prepared using Dragon voice recognition software and may include unintentional dictation errors.     Emerson Monte, FNP 05/27/20 1323

## 2020-05-27 NOTE — ED Triage Notes (Signed)
Pt needs covid test after exposure

## 2020-05-27 NOTE — Discharge Instructions (Signed)

## 2020-05-28 LAB — SARS-COV-2, NAA 2 DAY TAT

## 2020-05-28 LAB — NOVEL CORONAVIRUS, NAA: SARS-CoV-2, NAA: NOT DETECTED

## 2020-05-31 NOTE — Progress Notes (Signed)
Referring Provider: Chesley Noon, MD Primary Care Physician:  Chesley Noon, MD Primary GI Physician: Dr. Gala Romney  Chief Complaint  Patient presents with  . Diarrhea    HPI:   Paul [name] is a 69 y.o. male with a history of CAD,ischemic cardiomyopathy with EF of 30-35% in June 2020,V. tach in 2015 s/p ICD placement, HLD, IBS, GERD, and sleep apnea.  Last colonoscopy in August 2011 with Dr. Michail Sermon with mild nonspecific erythematous mucosa in the descending colon, possibly ischemia s/p biopsied, diverticulosis, internal hemorrhoids.  Pathology with focal active nonspecific colitis, main consideration included acute infectious colitis versus NSAID induced colitis and/or Crohn's but less likely.  He presents today for follow-up of upper abdominal discomfort/bloating and GERD. Last seen 03/13/20 for the same.   At the time of his last visit, he reported upper abdominal bloating/tightness x1 month.  Up 10 pounds not because he was eating.  Did not feel like she had gaseous distention.  GERD was well controlled on Protonix twice daily.  BMs daily.  No constipation.  Occasional loose stool which was chronic but has actually improved with following a lactose-free/low-fat diet.  No association between upper abdominal pain and changes in bowel habits.  No NSAIDs, BRBPR, melena, nausea, vomiting.  No swelling in the lower extremities.  Had HCTZ as needed for swelling but has not taken it in about 1 month.  He was to continue Protonix twice daily, avoid items that cause gas and bloating, follow lactose-free/low-fat diet, avoid NSAIDs, take hydrochlorothiazide for a few days to see if this helped, update ultrasound abdomen, request recent labs from PCP.  Received CMP completed with PCP 01/29/2020 with electrolytes and LFTs within normal limits.  Creatinine 1.17. Ultrasound 03/16/2020: Gallbladder within normal limits, evidence of fatty liver, no ascites.  Patient had started taking  hydrochlorothiazide and was improving.  He had lost about 4 pounds and distention was resolving.  Today:   Continues with abdominal bloating. Not affected by meals. No carbonated beverages. No broccoli, cauliflower. Occasional beans but not regularly.  Tried culturelle  and [employer] brand of probiotic which have been unhelpful. Some improvement when taking HCTZ but takes medication once every couple off weeks. Weighs himself daily. States his weight is typically stable.   GERD: Protonix BID is working well. No breakthrough symptoms. No dysphagia.   Diarrhea: Not as many episodes of diarrhea over the last few months but states he never knows when it will start. Can't identify triggers. Trying to follow lactose free/low fat diet. No pizza. Last episode of diarrhea was last Friday or Saturday. Had about 3 watery stools with urgency. Will take imodium to stop the diarrhea. Associated cramping prior to BMs that resolves thereafter. Had Grilled chicken and tater tots or french fries that were baked prior to onset of most recent episode of diarrhea. Fried foods maybe once every 2 weeks. No brbpr or melena. Diarrhea occurs may 6-7 times a month. States he can go 2 weeks with no symptoms at times. Feels gassy.   Typically 2-3 soft formed BMs daily aside from diarrhea episodes.    Has follow up with PCP for routine labs in October.  Has appointment to establish with new cardiologist in October as well.  He is due for colonoscopy.  Patient is okay with scheduling.   Past Medical History:  Diagnosis Date  . Basal cell carcinoma 07/17/2017   nod-L cheek (CX35FU)  . BPH (benign prostatic hyperplasia)   . CAD (coronary artery  disease)    widely patent LAD LIMA graft, widely patent PDA PLR SVG, occluded Diag 1 OM 1 SVG by cardiac CT 2007   . CHF (congestive heart failure) ([place])    ECHO 03/2019: EF 30-35%  . Fatty liver   . GERD (gastroesophageal reflux disease)   . Hyperlipidemia   . IBS (irritable  bowel syndrome)   . 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 Ischemic cardiomyopathy    Prior inferior infarct EF 35% by ECHO 07/13/11 ; ECHO 03/2019: EF 30-35%  . S/P CABG (coronary artery bypass graft)    CABG 06/1998 [place], [place] with LIMA to LAD, SVG to OM, SVG to PD-PL.   OM graft occluded 10/12/2005 by CTA   . SCC (squamous cell carcinoma) 11/11/2016   in situ- R anti helix (CX35FU), well diff-R upper crust of helix (CX35FU)  . Sleep apnea   . Ventricular tachycardia ([place]) 10/15   VT at 188 bpm- unstable requiring external defibrillation    Past Surgical History:  Procedure Laterality Date  . APPENDECTOMY    . COLONOSCOPY  06/11/2010   [place]; mild nonspecific erythematous mucosa in the descending colon s/p biopsied, diverticulosis, internal hemorrhoids.  Pathology with focal active colitis, nonspecific, main consideration includes acute infectious colitis.  NSAID induced colitis and/or Crohn's disease also in the differential but less likely.  . CORONARY ARTERY BYPASS GRAFT  1999  . IMPLANTABLE CARDIOVERTER DEFIBRILLATOR IMPLANT N/A 07/31/2014   MDT Gwyneth Revels XT DR ICD implanted by Dr Rayann Heman for secondary treatent of VT  . LEFT HEART CATHETERIZATION WITH CORONARY/GRAFT ANGIOGRAM N/A 07/30/2014   Procedure: LEFT HEART CATHETERIZATION WITH Beatrix Fetters;  Surgeon: Jacolyn Reedy, MD;  Location: [facility] CATH LAB;  Service: Cardiovascular;  Laterality: N/A;    Current Outpatient Medications  Medication Sig Dispense Refill  . Ascorbic Acid (VITAMIN C) 1000 MG tablet Take 1,000 mg by mouth daily.    Marland Kitchen atorvastatin (LIPITOR) 80 MG tablet TAKE (1) TABLET BY MOUTH AT BEDTIME. 90 tablet 1  . azelastine (OPTIVAR) 0.05 % ophthalmic solution Place 1 drop into both eyes as needed.     . cetirizine (ZYRTEC) 10 MG tablet Take 10 mg by mouth daily.    . clopidogrel (PLAVIX) 75 MG tablet Take 1 tablet (75 mg total) by mouth daily. 90 tablet 3  . Coenzyme Q10 50 MG  CAPS Take 1 capsule by mouth daily.    Marland Kitchen eplerenone (INSPRA) 25 MG tablet TAKE (1) TABLET BY MOUTH ONCE DAILY. 90 tablet 0  . fenofibrate 160 MG tablet Take 160 mg by mouth daily.    . fluticasone (FLONASE) 50 MCG/ACT nasal spray Place 2 sprays into both nostrils daily. 16 g 6  . folic acid (FOLVITE) 270 MCG tablet Take 800 mcg by mouth daily.     . hydrochlorothiazide (MICROZIDE) 12.5 MG capsule Take 1 capsule (12.5 mg total) by mouth as needed (weight gain 3lbs or more in 24 hours 5lbs in a week or swelling). 30 capsule 1  . ipratropium (ATROVENT) 0.06 % nasal spray as needed.    . Melatonin 5 MG TABS Take 5 mg by mouth at bedtime.     . metoprolol succinate (TOPROL-XL) 50 MG 24 hr tablet Take 1 tablet (50 mg total) by mouth daily. Take with or immediately following a meal. 90 tablet 3  . nitroGLYCERIN (NITROSTAT) 0.4 MG SL tablet Place 1 tablet (0.4 mg total) under the tongue as needed for chest pain. 25 tablet 3  . Omega-3 Fatty  Acids (FISH OIL) 1000 MG CAPS Take 1 capsule by mouth daily.    . pantoprazole (PROTONIX) 20 MG tablet Take 1 tablet (20 mg total) by mouth 2 (two) times daily. 60 tablet 11  . Probiotic Product (DAILY PROBIOTIC PO) Take by mouth daily.    . ramipril (ALTACE) 10 MG capsule TAKE (1) CAPSULE BY MOUTH ONCE DAILY. 90 capsule 0  . sildenafil (VIAGRA) 50 MG tablet Take 1 tablet (50 mg total) by mouth daily as needed for erectile dysfunction. 20 tablet 0  . SOTALOL AF 80 MG TABS 2 (two) times daily.    Marland Kitchen triamcinolone cream (KENALOG) 0.1 % Apply 1 application topically 2 (two) times daily as needed. 80 g 2  . Vitamin D, Cholecalciferol, 25 MCG (1000 UT) CAPS Take by mouth.    . Vitamin E 400 UNITS TABS Take 400 Units by mouth daily.      No current facility-administered medications for this visit.    Allergies as of 06/01/2020 - Review Complete 06/01/2020  Allergen Reaction Noted  . Carvedilol  01/18/2012  . Escitalopram oxalate  01/18/2012  . Niacin and related   01/18/2012  . Spironolactone  01/18/2012    Family History  Problem Relation Age of Onset  . Dementia Mother   . Cerebral aneurysm Father   . Pneumonia Brother   . Stroke Maternal Aunt   . Diabetes Neg Hx   . Heart attack Neg Hx   . Hypertension Neg Hx   . Colon cancer Neg Hx     Social History   Socioeconomic History  . Marital status: Married    Spouse name: Neoma Laming  . Number of children: 2  . Years of education: Not on file  . Highest education level: Not on file  Occupational History  . Occupation: retired  Tobacco Use  . Smoking status: Never Smoker  . Smokeless tobacco: Never Used  Vaping Use  . Vaping Use: Never used  Substance and Sexual Activity  . Alcohol use: Yes    Alcohol/week: 2.0 standard drinks    Types: 2 Glasses of wine per week    Comment: Couple beers a day and wine with dinner.   . Drug use: No  . Sexual activity: Not on file  Other Topics Concern  . Not on file  Social History Narrative  . Not on file   Social Determinants of Health   Financial Resource Strain: Low Risk   . Difficulty of Paying Living Expenses: Not hard at all  Food Insecurity: No Food Insecurity  . Worried About Charity fundraiser in the Last Year: Never true  . Ran Out of Food in the Last Year: Never true  Transportation Needs: No Transportation Needs  . Lack of Transportation (Medical): No  . Lack of Transportation (Non-Medical): No  Physical Activity: Inactive  . Days of Exercise per Week: 0 days  . Minutes of Exercise per Session: 0 min  Stress: No Stress Concern Present  . Feeling of Stress : Not at all  Social Connections: Moderately Integrated  . Frequency of Communication with Friends and Family: More than three times a week  . Frequency of Social Gatherings with Friends and Family: More than three times a week  . Attends Religious Services: 1 to 4 times per year  . Active Member of Clubs or Organizations: No  . Attends Archivist Meetings: Never   . Marital Status: Married    Review of Systems: Gen: Denies fever, chills, cold or  flulike symptoms, lightheadedness, dizziness, presyncope, syncope.  CV: Denies chest pain or palpitations. Resp: Denies dyspnea or cough. GI: See HPI.  Heme: See HPI  Physical Exam: BP 129/85   Pulse 65   Temp (!) 97.3 F (36.3 C) (Temporal)   Ht 5\' 8"  (1.727 m)   Wt 198 lb 9.6 oz (90.1 kg)   BMI 30.20 kg/m  General:   Alert and oriented. No distress noted. Pleasant and cooperative.  Head:  Normocephalic and atraumatic. Eyes:  Conjuctiva clear without scleral icterus. Heart:  S1, S2 present without murmurs appreciated. Lungs:  Clear to auscultation bilaterally. No wheezes, rales, or rhonchi. No distress.  Abdomen:  +BS, soft, non-tender and non-distended. No rebound or guarding. No HSM or masses noted. Msk:  Symmetrical without gross deformities. Normal posture. Extremities:  Without edema. Neurologic:  Alert and  oriented x4 Psych: Normal mood and affect.

## 2020-06-01 ENCOUNTER — Ambulatory Visit: Payer: PPO | Admitting: Gastroenterology

## 2020-06-01 ENCOUNTER — Other Ambulatory Visit: Payer: Self-pay

## 2020-06-01 ENCOUNTER — Encounter: Payer: Self-pay | Admitting: Gastroenterology

## 2020-06-01 VITALS — BP 129/85 | HR 65 | Temp 97.3°F | Ht 68.0 in | Wt 198.6 lb

## 2020-06-01 DIAGNOSIS — Z1211 Encounter for screening for malignant neoplasm of colon: Secondary | ICD-10-CM

## 2020-06-01 DIAGNOSIS — K219 Gastro-esophageal reflux disease without esophagitis: Secondary | ICD-10-CM

## 2020-06-01 DIAGNOSIS — R140 Abdominal distension (gaseous): Secondary | ICD-10-CM

## 2020-06-01 DIAGNOSIS — R197 Diarrhea, unspecified: Secondary | ICD-10-CM

## 2020-06-01 NOTE — Patient Instructions (Addendum)
Please have labs completed at [facility] will need a colonoscopy in the near future with Dr. Gala Romney.  We will hold off on scheduling until you have established with a new cardiologist.  We will request clearance from them.  Regarding diarrhea: We are evaluating for celiac disease. Continue following a lactose-free and low-fat diet. Please keep a open when diarrhea occurs and what you have eaten prior to onset. You may continue to use Imodium as needed.  You may go ahead and take Imodium when diarrhea starts. You may try adding a different probiotic.  [employer] colon health or align are good options.  Continue taking Protonix 20 mg daily 30 minutes before breakfast and dinner.  I recommend you resume taking hydrochlorothiazide as this has helped with your bloating in the past.  Paul [name], [facility]

## 2020-06-02 ENCOUNTER — Telehealth: Payer: Self-pay | Admitting: *Deleted

## 2020-06-02 ENCOUNTER — Telehealth: Payer: Self-pay

## 2020-06-02 LAB — IGA: IgA/Immunoglobulin A, Serum: 227 mg/dL (ref 61–437)

## 2020-06-02 LAB — TISSUE TRANSGLUTAMINASE, IGA: Transglutaminase IgA: <2 U/mL (ref 0–3)

## 2020-06-02 NOTE — Telephone Encounter (Signed)
The requesting surgeon's office is aware of upcoming appt with Dr. Johnsie Cancel 07/30/20 and states clearance can be addressed at that visit.

## 2020-06-02 NOTE — Progress Notes (Signed)
No evidence of celiac disease.   I would like to try to treat patient with Xifaxan 550mg  TID x14 days. This medication is used for IBS-diarrhea predominant as well as SIBO which fits his clinical picture of intermittent diarrhea and persistent bloating.   I can send in a Rx.  The medication is typically expensive and we may have to do a PA. We will know more once I send a Rx if patient is agreeable. He can let us know if it is too expensive etc.   Additionally:  He can continue with other recommendations made at [facility].  Avoid gas producing items including broccoli, cabbage, cauliflower, beans, carbonated beverages, artificial sweeter, and drinking through a straw.   *Correction on instructions from AVS. I would like for patient to keep log* of when diarrhea occurs and what he has eaten prior to onset.

## 2020-06-02 NOTE — Telephone Encounter (Signed)
Please make sure the patient's office visit in Oct is noted as Pre Op Clearance.  Thanks  Kerin Ransom PA-C 06/02/2020 10:04 AM

## 2020-06-02 NOTE — Telephone Encounter (Signed)
Pt was seen on 06/01/20 by Aliene Altes, PA. A letter has been faxed to Dr. Johnsie Cancel for cardiac clearance for a TCS with Dr. Gala Romney. Pt has an appointment with Dr. Johnsie Cancel on 07/30/20.

## 2020-06-02 NOTE — Telephone Encounter (Signed)
   [facility] Pre-operative Risk Assessment    HEARTCARE STAFF: - Please ensure there is not already an duplicate clearance open for this procedure. - Under Visit Info/Reason for Call, type in Other and utilize the format Clearance MM/DD/YY or Clearance TBD. Do not use dashes or single digits. - If request is for dental extraction, please clarify the # of teeth to be extracted.  Request for surgical clearance:  1. What type of surgery is being performed?  COLONOSCOPY   2. When is this surgery scheduled?  TBD   3. What type of clearance is required (medical clearance vs. Pharmacy clearance to hold med vs. Both)?  BOTH  4. Are there any medications that need to be held prior to surgery and how long? PLAVIX   5. Practice name and name of physician performing surgery?  [facility] / DR. Gala Romney   6. What is the office phone number?  [phone]   7.   What is the office fax number?  [phone]  8.   Anesthesia type (None, local, MAC, general) ?    Jeanann Lewandowsky 06/02/2020, 9:50 AM  _________________________________________________________________   (provider comments below)

## 2020-06-02 NOTE — Telephone Encounter (Signed)
Pt is scheduled to see Dr. Johnsie Cancel 07/30/20 and will address clearance at that time.  Will route back to the requesting surgeon's office to make them aware.

## 2020-06-03 ENCOUNTER — Encounter: Payer: Self-pay | Admitting: Gastroenterology

## 2020-06-03 ENCOUNTER — Other Ambulatory Visit: Payer: Self-pay | Admitting: Gastroenterology

## 2020-06-03 ENCOUNTER — Telehealth: Payer: Self-pay | Admitting: Internal Medicine

## 2020-06-03 DIAGNOSIS — Z1211 Encounter for screening for malignant neoplasm of colon: Secondary | ICD-10-CM | POA: Insufficient documentation

## 2020-06-03 DIAGNOSIS — R197 Diarrhea, unspecified: Secondary | ICD-10-CM

## 2020-06-03 DIAGNOSIS — R140 Abdominal distension (gaseous): Secondary | ICD-10-CM

## 2020-06-03 MED ORDER — RIFAXIMIN 550 MG PO TABS: 550.0000 mg | ORAL_TABLET | Freq: Three times a day (TID) | ORAL | 0 refills | Status: DC

## 2020-06-03 NOTE — Assessment & Plan Note (Addendum)
Chronic. Well controlled on Protonix 20 mg BID. No alarm symptoms. Advised he continue his current medications. Plan to follow-up in 4 months.

## 2020-06-03 NOTE — Telephone Encounter (Signed)
Noted. Will discuss with pt. See result note for documentation.

## 2020-06-03 NOTE — Progress Notes (Signed)
Spoke with pt. Pt is aware that another med won't be given at this time. Pt spoke with his pharmacy and the medication Xifaxan is going to cost over $1900. Pt said a PA can't be done due to it being a tier 5. I mentioned pt assistance to pt and he doesn't feel he'll qualify. Pt is asking if there is any other medicine he can try or if he can get samples. We currently don't have any Xifaxan samples.

## 2020-06-03 NOTE — Assessment & Plan Note (Addendum)
Patient reports persistent daily abdominal bloating and gassiness that has not responded to probiotics.  Symptoms do not seem to be affected by any particular foods.  He does report chronic history of IBS with intermittent episodes of loose stools with urgency and abdominal cramping.  Interestingly, at his last visit in May 2021, he reported bloating, and I advised that he take his hydrochlorothiazide for a few days as his proBNP was elevated on 02/26/20 although no appreciable peripheral edema.  He noted improvement with hydrochlorothiazide; however, patient states he only takes hydrochlorothiazide as needed which is typically once every 1-2 weeks as his weight is stable. We also completed abdominal ultrasound 03/16/2020 which revealed evidence of fatty liver, otherwise unremarkable, no ascites.   Abdominal bloating could be secondary to mild fluid retention in the setting of CHF. Additional differentials include celiac disease, dietary intolerances, and SIBO.   Plan:  TTg IgA and IgA total Continue lactose free and low fat diet.  May try a different probiotic including [employer]in Neg[employer] colon health or [employer].  Advised he go ahead and take hydrochlorothiazide daily for a few days again to see if this helps his symptoms.  Consider treating for SIBO with Xifaxan 550 TID x14 days.  Follow-up in 4 months.

## 2020-06-03 NOTE — Assessment & Plan Note (Addendum)
69 year old male who is due for screening colonoscopy. Last colonoscopy in August 2011 by Dr. Michail Sermon with mild nonspecific erythematous mucosa in the ascending colon, possibly ischemia s/p biopsy, diverticulosis, internal hemorrhoids.  Pathology with focal active nonspecific colitis, main consideration including acute infectious colitis versus NSAID induced colitis and/or Crohn's, but less likely.  Current lower GI symptoms include chronic intermittent diarrhea with associated abdominal cramping and urgency.  Also notes abdominal bloating.  These problems are addressed above.  Denies bright red blood per rectum, melena, or unintentional weight loss.  No family history of colon cancer.  Plan: Due to history of CHF with EF 30-35% in June 2020 and ICD, we will hold off on scheduling colonoscopy untill patient has to follow-up with cardiology in October.  We will request cardiac clearance. Procedure will be with propofol. ASA III

## 2020-06-03 NOTE — Telephone Encounter (Signed)
Pt called to say he was seen earlier this week by Aliene Altes, PA and had lab work done. He said the results were in Mychart and wanted to know what the next step would be and for Aliene Altes, PA to call him back. [phone]

## 2020-06-03 NOTE — Assessment & Plan Note (Addendum)
69 y.o. male who reports a long history of IBS symptoms including intermittent days of diarrhea with associated abdominal cramping and urgency.  Episodes occur 6-7 times per month with about 3 watery stools per day.  Takes Imodium which resolved symptoms.  Between episodes, 2-3 soft, formed BMs daily.  Also reports persistent abdominal bloating and gas that does not seem to be affected by any particular food and has not responded to probiotics.  Reported being on Bentyl in the past which did not work well.  He is now following a lactose-free and low-fat diet for the most part which seems to have decreased frequency of diarrhea episodes overall.  Denies BRBPR, melena, or unintentional weight loss.  Abdominal exam is benign.  Last colonoscopy in August 2011 by Dr. Michail Sermon with mild nonspecific erythematous mucosa in the descending colon, possibly ischemia s/p biopsy, diverticulosis, internal hemorrhoids.  Pathology with focal active nonspecific colitis, main consideration including acute infectious colitis versus NSAID induced colitis and/or Crohn's which was less likely.  He is due for updated colonoscopy at this time.  I still suspect intermittent diarrhea is secondary to dietary intolerances as he does consume fried/fatty foods intermittently.  Additional considerations include celiac disease, SIBO, and less likely pancreatic insufficiency or IBD.  Plan: TTG IgA, IgA Continue following a lactose-free diet. Continue following a low-fat diet. I have requested patient to keep a log of when diarrhea occurs and what foods he has eaten prior to onset to help tease out dietary triggers. May continue Imodium as needed. May try a different probiotic including [employer] or [employer]  Consider treating for SIBO/IBS-D with Xifaxan 550 mg 3 times daily x14 days if celiac serologies are negative. He needs screening colonoscopy.  We will schedule this after patient has follow-up with cardiology in October  and will also request cardiac clearance. Plan to follow-up in 4 months.  Hopefully this will be after colonoscopy.

## 2020-06-05 ENCOUNTER — Other Ambulatory Visit: Payer: Self-pay | Admitting: Internal Medicine

## 2020-06-08 ENCOUNTER — Ambulatory Visit (INDEPENDENT_AMBULATORY_CARE_PROVIDER_SITE_OTHER): Payer: PPO

## 2020-06-08 DIAGNOSIS — I5022 Chronic systolic (congestive) heart failure: Secondary | ICD-10-CM

## 2020-06-08 DIAGNOSIS — Z95810 Presence of automatic (implantable) cardiac defibrillator: Secondary | ICD-10-CM

## 2020-06-10 NOTE — Progress Notes (Signed)
EPIC Encounter for ICM Monitoring  Patient Name: Paul [name] is a 69 y.o. male Date: 06/10/2020 Primary Care Physican: Chesley Noon, MD Primary Cardiologist:Nishan Electrophysiologist: Allred 8/18/2021Weight:190lbs   Spoke with patient and reports feeling well at this time. Denies fluid symptoms.He said he took PRN HCTZ around 05/24/2020  Optivol thoracic impedance normal.  Prescribed:Hydrochlorothiazide 12.5 mgTake 1 capsule (12.5 mg total) by mouth as needed (weight gain 3lbs or more in 24 hours 5lbs in a week or swelling).  Labs: 02/26/2020 Creatinine 1.17, BUN 9, Potassium 5.0, Sodium 142, GFR 63-73  Recommendations:No changes and encouraged to call if experiencing any fluid symptoms.  Follow-up plan: ICM clinic phone appointment on 07/13/2020.   91 day device clinic remote transmission 08/06/2020.    EP/Cardiology Office Visits: 07/30/2020 with Dr. Johnsie Cancel.  Last EP visit 02/26/2020 with Oda Kilts, PA  Copy of ICM check sent to Dr. Rayann Heman.   3 month ICM trend: 06/08/2020    1 Year ICM trend:       [name], RN 06/10/2020 3:35 PM

## 2020-06-23 DIAGNOSIS — E538 Deficiency of other specified B group vitamins: Secondary | ICD-10-CM | POA: Diagnosis not present

## 2020-07-07 ENCOUNTER — Telehealth: Payer: Self-pay | Admitting: Internal Medicine

## 2020-07-07 NOTE — Telephone Encounter (Signed)
Spoke with pt. I sent pt assistance forms to pt. Pt said that he read the criteria and he won't qualify for pt assistance. We don't currently have enough samples to treat pt. Please advise if there is anything else pt can take.

## 2020-07-07 NOTE — Telephone Encounter (Signed)
[phone] patient called and said that Cyril Mourning was going to try and get him samples of ,he thinks, xiafaxin.  Michela Pitcher it was a very expensive medication.  He has not heard from [place] yet about this

## 2020-07-08 ENCOUNTER — Other Ambulatory Visit: Payer: Self-pay | Admitting: Gastroenterology

## 2020-07-08 DIAGNOSIS — R197 Diarrhea, unspecified: Secondary | ICD-10-CM

## 2020-07-08 DIAGNOSIS — R140 Abdominal distension (gaseous): Secondary | ICD-10-CM

## 2020-07-08 MED ORDER — METRONIDAZOLE 250 MG PO TABS: 250.0000 mg | ORAL_TABLET | Freq: Three times a day (TID) | ORAL | 0 refills | Status: AC

## 2020-07-08 NOTE — Telephone Encounter (Signed)
Rx sent 

## 2020-07-08 NOTE — Telephone Encounter (Signed)
Spoke with pt. Pt says he has upset stomach with Bactrium, Augmentin and sometimes Cipro. Pt has tried Metronidazole and states that he can take Amoxicillin.

## 2020-07-08 NOTE — Telephone Encounter (Signed)
Ok, looks like the only option would be Metronidazole. We can try this if he would like. He can not drink alcohol with this medication. Please let me know if he would like me to send a Rx.  The Rx would be metronidazole 250 mg TID x 10 days.

## 2020-07-08 NOTE — Telephone Encounter (Signed)
Spoke with pt. Pt is ok with Metronidazole 250 mg tid x 10 days. Please send to [place].

## 2020-07-08 NOTE — Telephone Encounter (Signed)
Noted. Pt is aware 

## 2020-07-08 NOTE — Telephone Encounter (Signed)
Noted. Please ask if patient has any allergies to the following: Ciprofloxacin, Bactrim (sulfa antibiotics), Metronidazole, Augmentin (Penicillins).

## 2020-07-13 ENCOUNTER — Ambulatory Visit (INDEPENDENT_AMBULATORY_CARE_PROVIDER_SITE_OTHER): Payer: PPO

## 2020-07-13 DIAGNOSIS — I5022 Chronic systolic (congestive) heart failure: Secondary | ICD-10-CM | POA: Diagnosis not present

## 2020-07-13 DIAGNOSIS — Z95810 Presence of automatic (implantable) cardiac defibrillator: Secondary | ICD-10-CM

## 2020-07-15 ENCOUNTER — Encounter: Payer: Self-pay | Admitting: Physician Assistant

## 2020-07-15 NOTE — Progress Notes (Signed)
EPIC Encounter for ICM Monitoring  Patient Name: Paul [name] is a 69 y.o. male Date: 07/15/2020 Primary Care Physican: Chesley Noon, MD Primary Cardiologist:Nishan Electrophysiologist: Allred 9/22/2021Weight:192.3lbs   Spoke with patient and reports feeling well at this time. Denies fluid symptoms.He was on vacation during decreased impedance the last week.  Optivol thoracic impedance normal.  Prescribed:Hydrochlorothiazide 12.5 mgTake 1 capsule (12.5 mg total) by mouth as needed (weight gain 3lbs or more in 24 hours 5lbs in a week or swelling).  Labs: 02/26/2020 Creatinine 1.17, BUN 9, Potassium 5.0, Sodium 142, GFR 63-73  Recommendations:No changes and encouraged to call if experiencing any fluid symptoms.  Follow-up plan: ICM clinic phone appointment on10/26/2021. 91 day device clinic remote transmission 08/17/2020.   EP/Cardiology Office Visits:07/30/2020 with Dr.Nishan. Advised pt he is due for yearly appt with Dr Rayann Heman and message sent to scheduler to cal pt.  Copy of ICM check sent to Dr.Allred.    3 month ICM trend: 07/13/2020    1 Year ICM trend:       [name], RN 07/15/2020 12:19 PM

## 2020-07-18 NOTE — Telephone Encounter (Signed)
I called pt

## 2020-07-30 ENCOUNTER — Ambulatory Visit: Payer: PPO | Admitting: Cardiovascular Disease

## 2020-08-10 ENCOUNTER — Ambulatory Visit: Payer: PPO | Admitting: Physician Assistant

## 2020-08-12 DIAGNOSIS — K580 Irritable bowel syndrome with diarrhea: Secondary | ICD-10-CM | POA: Diagnosis not present

## 2020-08-12 DIAGNOSIS — E785 Hyperlipidemia, unspecified: Secondary | ICD-10-CM | POA: Diagnosis not present

## 2020-08-12 DIAGNOSIS — I10 Essential (primary) hypertension: Secondary | ICD-10-CM | POA: Diagnosis not present

## 2020-08-12 DIAGNOSIS — E538 Deficiency of other specified B group vitamins: Secondary | ICD-10-CM | POA: Diagnosis not present

## 2020-08-12 DIAGNOSIS — Z23 Encounter for immunization: Secondary | ICD-10-CM | POA: Diagnosis not present

## 2020-08-12 DIAGNOSIS — R140 Abdominal distension (gaseous): Secondary | ICD-10-CM | POA: Diagnosis not present

## 2020-08-12 DIAGNOSIS — R109 Unspecified abdominal pain: Secondary | ICD-10-CM | POA: Diagnosis not present

## 2020-08-12 DIAGNOSIS — G4730 Sleep apnea, unspecified: Secondary | ICD-10-CM | POA: Diagnosis not present

## 2020-08-17 ENCOUNTER — Ambulatory Visit (INDEPENDENT_AMBULATORY_CARE_PROVIDER_SITE_OTHER): Payer: PPO

## 2020-08-17 DIAGNOSIS — I255 Ischemic cardiomyopathy: Secondary | ICD-10-CM

## 2020-08-18 ENCOUNTER — Ambulatory Visit (INDEPENDENT_AMBULATORY_CARE_PROVIDER_SITE_OTHER): Payer: PPO

## 2020-08-18 DIAGNOSIS — Z95810 Presence of automatic (implantable) cardiac defibrillator: Secondary | ICD-10-CM

## 2020-08-18 DIAGNOSIS — I5022 Chronic systolic (congestive) heart failure: Secondary | ICD-10-CM | POA: Diagnosis not present

## 2020-08-18 LAB — CUP PACEART REMOTE DEVICE CHECK
Battery Remaining Longevity: 41 mo
Battery Voltage: 2.96 V
Brady Statistic AP VP Percent: 0.01 %
Brady Statistic AP VS Percent: 14.77 %
Brady Statistic AS VP Percent: 0.04 %
Brady Statistic AS VS Percent: 85.17 %
Brady Statistic RA Percent Paced: 14.11 %
Brady Statistic RV Percent Paced: 0.06 %
Date Time Interrogation Session: 20211025044223
HighPow Impedance: 73 Ohm
Implantable Lead Implant Date: 20151008
Implantable Lead Implant Date: 20151008
Implantable Lead Location: 753859
Implantable Lead Location: 753860
Implantable Lead Model: 5076
Implantable Lead Model: 6935
Implantable Pulse Generator Implant Date: 20151008
Lead Channel Impedance Value: 285 Ohm
Lead Channel Impedance Value: 342 Ohm
Lead Channel Impedance Value: 456 Ohm
Lead Channel Pacing Threshold Amplitude: 1 V
Lead Channel Pacing Threshold Amplitude: 1.125 V
Lead Channel Pacing Threshold Pulse Width: 0.4 ms
Lead Channel Pacing Threshold Pulse Width: 0.4 ms
Lead Channel Sensing Intrinsic Amplitude: 1.75 mV
Lead Channel Sensing Intrinsic Amplitude: 1.75 mV
Lead Channel Sensing Intrinsic Amplitude: 7.875 mV
Lead Channel Sensing Intrinsic Amplitude: 7.875 mV
Lead Channel Setting Pacing Amplitude: 2.25 V
Lead Channel Setting Pacing Amplitude: 2.5 V
Lead Channel Setting Pacing Pulse Width: 0.4 ms
Lead Channel Setting Sensing Sensitivity: 0.3 mV

## 2020-08-20 NOTE — Progress Notes (Signed)
Remote ICD transmission.   

## 2020-08-21 ENCOUNTER — Other Ambulatory Visit: Payer: Self-pay | Admitting: Family Medicine

## 2020-08-21 DIAGNOSIS — R140 Abdominal distension (gaseous): Secondary | ICD-10-CM

## 2020-08-21 DIAGNOSIS — R109 Unspecified abdominal pain: Secondary | ICD-10-CM

## 2020-08-21 NOTE — Progress Notes (Signed)
EPIC Encounter for ICM Monitoring  Patient Name: Paul [name] is a 69 y.o. male Date: 08/21/2020 Primary Care Physican: Chesley Noon, MD Primary Cardiologist:Hochrein Electrophysiologist: Allred 9/22/2021Weight:192.3lbs   Spoke with patient and reports feeling well at this time. Denies fluid symptoms.  Optivol thoracic impedance normal.  Prescribed:Hydrochlorothiazide 12.5 mgTake 1 capsule (12.5 mg total) by mouth as needed (weight gain 3lbs or more in 24 hours 5lbs in a week or swelling).  Labs: 02/26/2020 Creatinine 1.17, BUN 9, Potassium 5.0, Sodium 142, GFR 63-73  Recommendations:No changes and encouraged to call if experiencing any fluid symptoms.  Follow-up plan: ICM clinic phone appointment on12/15/2021. 91 day device clinic remote transmission not scheduled yet.   EP/Cardiology Office Visits:09/21/2020 with Dr Rayann Heman.  Copy of ICM check sent to Dr.Allred.   3 month ICM trend: 08/17/2020    1 Year ICM trend:       [name], RN 08/21/2020 4:21 PM

## 2020-08-25 NOTE — Progress Notes (Signed)
Cardiology Office Note   Date:  08/26/2020   ID:  [name], [name] 01-16-1951, MRN [id number]  PCP:  Chesley Noon, MD  Cardiologist:   No primary care provider on file.   Chief Complaint  Patient presents with  . Coronary Artery Disease      History of Present Illness: Paul [name] is a 69 y.o. male who presents for follow up of an ischemic cardiomyopathy.  He has an ICD.   He has a CABG.  His EF is 30 - 35%.    This is my first visit with him.  He has done well.  He is active.  He walks for exercise. The patient denies any new symptoms such as chest discomfort, neck or arm discomfort. There has been no new shortness of breath, PND or orthopnea. There have been no reported palpitations, presyncope or syncope.   He climbs stairs daily in his house.  He has no limitations with this.  He is mostly bothered by some irritable bowel symptoms.   Past Medical History:  Diagnosis Date  . Basal cell carcinoma 07/17/2017   nod-L cheek (CX35FU)  . BPH (benign prostatic hyperplasia)   . CAD (coronary artery disease)    widely patent LAD LIMA graft, widely patent PDA PLR SVG, occluded Diag 1 OM 1 SVG by cardiac CT 2007   . CHF (congestive heart failure) ([place])    ECHO 03/2019: EF 30-35%  . Fatty liver   . GERD (gastroesophageal reflux disease)   . Hyperlipidemia   . IBS (irritable bowel syndrome)   . 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 Ischemic cardiomyopathy    Prior inferior infarct EF 35% by ECHO 07/13/11 ; ECHO 03/2019: EF 30-35%  . S/P CABG (coronary artery bypass graft)    CABG 06/1998 [place], [place] with LIMA to LAD, SVG to OM, SVG to PD-PL.   OM graft occluded 10/12/2005 by CTA   . SCC (squamous cell carcinoma) 11/11/2016   in situ- R anti helix (CX35FU), well diff-R upper crust of helix (CX35FU)  . Sleep apnea   . Ventricular tachycardia ([place]) 10/15   VT at 188 bpm- unstable requiring external defibrillation    Past Surgical History:   Procedure Laterality Date  . APPENDECTOMY    . COLONOSCOPY  06/11/2010   [place]; mild nonspecific erythematous mucosa in the descending colon s/p biopsied, diverticulosis, internal hemorrhoids.  Pathology with focal active colitis, nonspecific, main consideration includes acute infectious colitis.  NSAID induced colitis and/or Crohn's disease also in the differential but less likely.  . CORONARY ARTERY BYPASS GRAFT  1999  . IMPLANTABLE CARDIOVERTER DEFIBRILLATOR IMPLANT N/A 07/31/2014   MDT Gwyneth Revels XT DR ICD implanted by Dr Rayann Heman for secondary treatent of VT  . LEFT HEART CATHETERIZATION WITH CORONARY/GRAFT ANGIOGRAM N/A 07/30/2014   Procedure: LEFT HEART CATHETERIZATION WITH Beatrix Fetters;  Surgeon: Jacolyn Reedy, MD;  Location: [facility] CATH LAB;  Service: Cardiovascular;  Laterality: N/A;     Current Outpatient Medications  Medication Sig Dispense Refill  . Ascorbic Acid (VITAMIN C) 1000 MG tablet Take 1,000 mg by mouth daily.    Marland Kitchen atorvastatin (LIPITOR) 80 MG tablet TAKE (1) TABLET BY MOUTH AT BEDTIME. 90 tablet 1  . cetirizine (ZYRTEC) 10 MG tablet Take 10 mg by mouth daily.    . clopidogrel (PLAVIX) 75 MG tablet Take 1 tablet (75 mg total) by mouth daily. 90 tablet 3  . Coenzyme Q10 50 MG CAPS Take 1 capsule  by mouth daily.    Marland Kitchen eplerenone (INSPRA) 25 MG tablet TAKE (1) TABLET BY MOUTH ONCE DAILY. 90 tablet 0  . fenofibrate 160 MG tablet Take 160 mg by mouth daily.    . fluticasone (FLONASE) 50 MCG/ACT nasal spray Place 2 sprays into both nostrils daily. 16 g 6  . folic acid (FOLVITE) 188 MCG tablet Take 800 mcg by mouth daily.     . Melatonin 5 MG TABS Take 5 mg by mouth at bedtime.     . metoprolol succinate (TOPROL-XL) 50 MG 24 hr tablet TAKE 1 TABLET BY MOUTH ONCE DAILY. TAKE WITH OR IMMEDIATELY FOLLOWING A MEAL. 90 tablet 3  . nitroGLYCERIN (NITROSTAT) 0.4 MG SL tablet Place 1 tablet (0.4 mg total) under the tongue as needed for chest pain. 25 tablet 3  . Omega-3 Fatty  Acids (FISH OIL) 1000 MG CAPS Take 1 capsule by mouth daily.    Marland Kitchen omeprazole (PRILOSEC) 20 MG capsule Take 20 mg by mouth in the morning and at bedtime.    . ramipril (ALTACE) 10 MG capsule TAKE (1) CAPSULE BY MOUTH ONCE DAILY. 90 capsule 0  . sildenafil (VIAGRA) 50 MG tablet Take 1 tablet (50 mg total) by mouth daily as needed for erectile dysfunction. 20 tablet 11  . triamcinolone cream (KENALOG) 0.1 % Apply 1 application topically 2 (two) times daily as needed. 80 g 2  . Vitamin E 400 UNITS TABS Take 400 Units by mouth daily.     Marland Kitchen azelastine (OPTIVAR) 0.05 % ophthalmic solution Place 1 drop into both eyes as needed.  (Patient not taking: Reported on 08/26/2020)    . hydrochlorothiazide (MICROZIDE) 12.5 MG capsule Take 1 capsule (12.5 mg total) by mouth as needed (weight gain 3lbs or more in 24 hours 5lbs in a week or swelling). 30 capsule 1  . ipratropium (ATROVENT) 0.06 % nasal spray as needed. (Patient not taking: Reported on 08/26/2020)    . pantoprazole (PROTONIX) 20 MG tablet Take 1 tablet (20 mg total) by mouth 2 (two) times daily. (Patient not taking: Reported on 08/26/2020) 60 tablet 11  . Probiotic Product (DAILY PROBIOTIC PO) Take by mouth daily.    Marland Kitchen SOTALOL AF 80 MG TABS 2 (two) times daily.    . Vitamin D, Cholecalciferol, 25 MCG (1000 UT) CAPS Take by mouth. (Patient not taking: Reported on 08/26/2020)     No current facility-administered medications for this visit.    Allergies:   Amiodarone, Carvedilol, Escitalopram oxalate, Lasix [furosemide], Niacin and related, and Spironolactone    ROS:  Please see the history of present illness.   Otherwise, review of systems are positive for none.   All other systems are reviewed and negative.    PHYSICAL EXAM: VS:  BP 132/80 (BP Location: Left Arm, Patient Position: Sitting)   Pulse 60   Ht 5\' 8"  (1.727 m)   Wt 198 lb 6.4 oz (90 kg)   SpO2 96%   BMI 30.17 kg/m  , BMI Body mass index is 30.17 kg/m. GENERAL:  Well appearing NECK:   No jugular venous distention, waveform within normal limits, carotid upstroke brisk and symmetric, no bruits, no thyromegaly LUNGS:  Clear to auscultation bilaterally CHEST:  Well healed ICD and CABG scar.  HEART:  PMI not displaced or sustained,S1 and S2 within normal limits, no S3, no S4, no clicks, no rubs, no murmurs ABD:  Flat, positive bowel sounds normal in frequency in pitch, no bruits, no rebound, no guarding, no midline pulsatile mass,  no hepatomegaly, no splenomegaly EXT:  2 plus pulses throughout, no edema, no cyanosis no clubbing   EKG:  EKG is ordered today. The ekg ordered today demonstrates sinus rhythm, rate 60, axis within normal limits, intervals within normal limits, low voltage in the limb leads, nonspecific T wave flattening.   Recent Labs: 09/16/2019: Hemoglobin 15.5; Platelets 157 02/26/2020: BUN 9; Creatinine, Ser 1.17; Magnesium 1.5; NT-Pro BNP 987; Potassium 5.0; Sodium 142    Lipid Panel    Component Value Date/Time   CHOL 128 11/11/2016 1513   TRIG 80.0 11/11/2016 1513   HDL 61.00 11/11/2016 1513   CHOLHDL 2 11/11/2016 1513   VLDL 16.0 11/11/2016 1513   LDLCALC 51 11/11/2016 1513      Wt Readings from Last 3 Encounters:  08/26/20 198 lb 6.4 oz (90 kg)  06/01/20 198 lb 9.6 oz (90.1 kg)  03/13/20 197 lb 9.6 oz (89.6 kg)      Other studies Reviewed: Additional studies/ records that were reviewed today include: EP notes and device interrogation. Review of the above records demonstrates:  Please see elsewhere in the note.     ASSESSMENT AND PLAN:  CHRONIC SYSTOLIC HF:    His impedance has been normal.  He has had no further symptoms.  He has done very well with the med regimen and said he could not afford Entresto so I will not titrate further.  Otherwise no change in therapy.  We talked about salt and fluid.  He has been intolerant to Lasix in the past.  On the rare occasion when his impedance is not at target he is dose adjusted with extra  hydrochlorothiazide.  CAD: He has had no symptoms and he participates in secondary risk reduction.  DYSLIPIDEMIA: LDL was 51 with an HDL of 61.  He will continue on the meds as listed.  VT:  He is up to date with his ICD and I reviewed the most recent reading for this appt.  He had normal impedance.   I added amiodarone to his medication intolerances.    Current medicines are reviewed at length with the patient today.  The patient does not have concerns regarding medicines.  The following changes have been made:  no change  Labs/ tests ordered today include:   Orders Placed This Encounter  Procedures  . EKG 12-Lead     Disposition:   FU with me in six months.     Signed, Minus Breeding, MD  08/26/2020 4:41 PM    [facility]

## 2020-08-26 ENCOUNTER — Encounter: Payer: Self-pay | Admitting: Cardiology

## 2020-08-26 ENCOUNTER — Ambulatory Visit (INDEPENDENT_AMBULATORY_CARE_PROVIDER_SITE_OTHER): Payer: PPO | Admitting: Cardiology

## 2020-08-26 ENCOUNTER — Other Ambulatory Visit: Payer: Self-pay

## 2020-08-26 VITALS — BP 132/80 | HR 60 | Ht 68.0 in | Wt 198.4 lb

## 2020-08-26 DIAGNOSIS — I472 Ventricular tachycardia, unspecified: Secondary | ICD-10-CM

## 2020-08-26 DIAGNOSIS — E785 Hyperlipidemia, unspecified: Secondary | ICD-10-CM | POA: Diagnosis not present

## 2020-08-26 DIAGNOSIS — I5022 Chronic systolic (congestive) heart failure: Secondary | ICD-10-CM

## 2020-08-26 DIAGNOSIS — I25810 Atherosclerosis of coronary artery bypass graft(s) without angina pectoris: Secondary | ICD-10-CM | POA: Diagnosis not present

## 2020-08-26 MED ORDER — CLOPIDOGREL BISULFATE 75 MG PO TABS: 75.0000 mg | ORAL_TABLET | Freq: Every day | ORAL | 3 refills | Status: DC

## 2020-08-26 MED ORDER — SILDENAFIL CITRATE 50 MG PO TABS: 50.0000 mg | ORAL_TABLET | Freq: Every day | ORAL | 11 refills | Status: DC | PRN

## 2020-08-26 NOTE — Patient Instructions (Addendum)
Medication Instructions:  No changes   medication refilled , - aware not to take Nitroglycerin if you have use sildenafil    *If you need a refill on your cardiac medications before your next appointment, please call your pharmacy*   Lab Work: Not needed  If you have labs (blood work) drawn today and your tests are completely normal, you will receive your results only by: Marland Kitchen MyChart Message (if you have MyChart) OR . A paper copy in the mail If you have any lab test that is abnormal or we need to change your treatment, we will call you to review the results.   Testing/Procedures: Not needed   Follow-Up: At [facility], you and your health needs are our priority.  As part of our continuing mission to provide you with exceptional heart care, we have created designated Provider Care Teams.  These Care Teams include your primary Cardiologist (physician) and Advanced Practice Providers (APPs -  Physician Assistants and Nurse Practitioners) who all work together to provide you with the care you need, when you need it.  We recommend signing up for the patient portal called "MyChart".  Sign up information is provided on this After Visit Summary.  MyChart is used to connect with patients for Virtual Visits (Telemedicine).  Patients are able to view lab/test results, encounter notes, upcoming appointments, etc.  Non-urgent messages can be sent to your provider as well.   To learn more about what you can do with MyChart, go to [email or web address].    Your next appointment:   6 month(s)  The format for your next appointment:   In Person  Provider:   Minus Breeding, MD

## 2020-08-31 ENCOUNTER — Ambulatory Visit: Payer: PPO | Admitting: Cardiovascular Disease

## 2020-09-02 ENCOUNTER — Ambulatory Visit: Payer: PPO | Admitting: Student

## 2020-09-04 ENCOUNTER — Other Ambulatory Visit: Payer: Self-pay | Admitting: Internal Medicine

## 2020-09-04 ENCOUNTER — Other Ambulatory Visit: Payer: Self-pay

## 2020-09-04 ENCOUNTER — Ambulatory Visit
Admission: RE | Admit: 2020-09-04 | Discharge: 2020-09-04 | Disposition: A | Discharge status: Home / Self Care | Payer: PPO | Source: Ambulatory Visit | Attending: Family Medicine | Admitting: Family Medicine

## 2020-09-04 DIAGNOSIS — Z950 Presence of cardiac pacemaker: Secondary | ICD-10-CM | POA: Diagnosis not present

## 2020-09-04 DIAGNOSIS — I700 Atherosclerosis of aorta: Secondary | ICD-10-CM | POA: Diagnosis not present

## 2020-09-04 DIAGNOSIS — R140 Abdominal distension (gaseous): Secondary | ICD-10-CM

## 2020-09-04 DIAGNOSIS — K5730 Diverticulosis of large intestine without perforation or abscess without bleeding: Secondary | ICD-10-CM | POA: Diagnosis not present

## 2020-09-04 DIAGNOSIS — M47817 Spondylosis without myelopathy or radiculopathy, lumbosacral region: Secondary | ICD-10-CM | POA: Diagnosis not present

## 2020-09-04 DIAGNOSIS — R109 Unspecified abdominal pain: Secondary | ICD-10-CM

## 2020-09-04 MED ORDER — IOPAMIDOL (ISOVUE-300) INJECTION 61%
100.0000 mL | Freq: Once | INTRAVENOUS | Status: AC | PRN
  Administered 2020-09-04: 100 mL via INTRAVENOUS

## 2020-09-04 NOTE — Telephone Encounter (Signed)
Outpatient Medication Detail   Disp Refills Start End   metoprolol succinate (TOPROL-XL) 50 MG 24 hr tablet 90 tablet 3 06/05/2020    Sig: TAKE 1 TABLET BY MOUTH ONCE DAILY. TAKE WITH OR IMMEDIATELY FOLLOWING A MEAL.   Sent to pharmacy as: metoprolol succinate (TOPROL-XL) 50 MG 24 hr tablet   E-Prescribing Status: Receipt confirmed by pharmacy (06/05/2020  5:02 PM EDT)   Pharmacy  [place], [place]

## 2020-09-10 MED ORDER — SOTALOL HCL (AF) 80 MG PO TABS: 80.0000 mg | ORAL_TABLET | Freq: Two times a day (BID) | ORAL | 6 refills | Status: DC

## 2020-09-14 ENCOUNTER — Other Ambulatory Visit: Payer: Self-pay

## 2020-09-14 ENCOUNTER — Ambulatory Visit
Admission: RE | Admit: 2020-09-14 | Discharge: 2020-09-14 | Disposition: A | Discharge status: Home / Self Care | Payer: PPO | Source: Ambulatory Visit | Attending: Emergency Medicine | Admitting: Emergency Medicine

## 2020-09-14 VITALS — BP 156/88 | HR 60 | Temp 98.9°F | Resp 18 | Ht 68.0 in | Wt 192.0 lb

## 2020-09-14 DIAGNOSIS — Z20822 Contact with and (suspected) exposure to covid-19: Secondary | ICD-10-CM

## 2020-09-14 DIAGNOSIS — R059 Cough, unspecified: Secondary | ICD-10-CM | POA: Diagnosis not present

## 2020-09-14 DIAGNOSIS — R0989 Other specified symptoms and signs involving the circulatory and respiratory systems: Secondary | ICD-10-CM

## 2020-09-14 DIAGNOSIS — R6889 Other general symptoms and signs: Secondary | ICD-10-CM

## 2020-09-14 MED ORDER — PREDNISONE 10 MG (21) PO TBPK
ORAL_TABLET | Freq: Every day | ORAL | 0 refills | Status: DC
Start: 2020-09-14 — End: 2020-11-02

## 2020-09-14 MED ORDER — BENZONATATE 100 MG PO CAPS
100.0000 mg | ORAL_CAPSULE | Freq: Three times a day (TID) | ORAL | 0 refills | Status: DC
Start: 2020-09-14 — End: 2020-11-19

## 2020-09-14 NOTE — ED Provider Notes (Signed)
[place]   [id number] 09/14/20 Arrival Time: 1200   CC: COVID symptoms  SUBJECTIVE: History from: patient.  Paul [name] is a 69 y.o. male who presents with cough and runny nose x 1-2 days.  Denies sick exposure to COVID, flu or strep.  Has tried OTC medications without relief.  Symptoms are made worse with coughing.  Denies previous covid infection in the past.  Received both covid vaccines, booster and flu shot.  Denies fever, chills, sore throat, SOB, wheezing, chest pain, nausea, changes in bowel or bladder habits.     ROS: As per HPI.  All other pertinent ROS negative.     Past Medical History:  Diagnosis Date  . Basal cell carcinoma 07/17/2017   nod-L cheek (CX35FU)  . BPH (benign prostatic hyperplasia)   . CAD (coronary artery disease)    widely patent LAD LIMA graft, widely patent PDA PLR SVG, occluded Diag 1 OM 1 SVG by cardiac CT 2007   . CHF (congestive heart failure) ([place])    ECHO 03/2019: EF 30-35%  . Fatty liver   . GERD (gastroesophageal reflux disease)   . Hyperlipidemia   . IBS (irritable bowel syndrome)   . 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 Ischemic cardiomyopathy    Prior inferior infarct EF 35% by ECHO 07/13/11 ; ECHO 03/2019: EF 30-35%  . S/P CABG (coronary artery bypass graft)    CABG 06/1998 [place], [place] with LIMA to LAD, SVG to OM, SVG to PD-PL.   OM graft occluded 10/12/2005 by CTA   . SCC (squamous cell carcinoma) 11/11/2016   in situ- R anti helix (CX35FU), well diff-R upper crust of helix (CX35FU)  . Sleep apnea   . Ventricular tachycardia ([place]) 10/15   VT at 188 bpm- unstable requiring external defibrillation   Past Surgical History:  Procedure Laterality Date  . APPENDECTOMY    . COLONOSCOPY  06/11/2010   [place]; mild nonspecific erythematous mucosa in the descending colon s/p biopsied, diverticulosis, internal hemorrhoids.  Pathology with focal active colitis, nonspecific, main  consideration includes acute infectious colitis.  NSAID induced colitis and/or Crohn's disease also in the differential but less likely.  . CORONARY ARTERY BYPASS GRAFT  1999  . IMPLANTABLE CARDIOVERTER DEFIBRILLATOR IMPLANT N/A 07/31/2014   MDT Gwyneth Revels XT DR ICD implanted by Dr Rayann Heman for secondary treatent of VT  . LEFT HEART CATHETERIZATION WITH CORONARY/GRAFT ANGIOGRAM N/A 07/30/2014   Procedure: LEFT HEART CATHETERIZATION WITH Beatrix Fetters;  Surgeon: Jacolyn Reedy, MD;  [facility];  Service: Cardiovascular;  Laterality: N/A;   Allergies  Allergen Reactions  . Amiodarone     Thyroid issues  . Carvedilol     Fatigue   . Escitalopram Oxalate     unknown  . Lasix [Furosemide]     Stomach upset and nausea  . Niacin And Related     Flushing   . Spironolactone     gynecomastia   No current facility-administered medications on file prior to encounter.   Current Outpatient Medications on File Prior to Encounter  Medication Sig Dispense Refill  . Ascorbic Acid (VITAMIN C) 1000 MG tablet Take 1,000 mg by mouth daily.    Marland Kitchen atorvastatin (LIPITOR) 80 MG tablet TAKE (1) TABLET BY MOUTH AT BEDTIME. 90 tablet 1  . azelastine (OPTIVAR) 0.05 % ophthalmic solution Place 1 drop into both eyes as needed.  (Patient not taking: Reported on 08/26/2020)    . cetirizine (ZYRTEC) 10 MG tablet Take 10  mg by mouth daily.    . clopidogrel (PLAVIX) 75 MG tablet Take 1 tablet (75 mg total) by mouth daily. 90 tablet 3  . Coenzyme Q10 50 MG CAPS Take 1 capsule by mouth daily.    Marland Kitchen eplerenone (INSPRA) 25 MG tablet TAKE (1) TABLET BY MOUTH ONCE DAILY. 90 tablet 0  . fenofibrate 160 MG tablet Take 160 mg by mouth daily.    . fluticasone (FLONASE) 50 MCG/ACT nasal spray Place 2 sprays into both nostrils daily. 16 g 6  . folic acid (FOLVITE) 884 MCG tablet Take 800 mcg by mouth daily.     . hydrochlorothiazide (MICROZIDE) 12.5 MG capsule Take 1 capsule (12.5 mg total) by mouth as needed  (weight gain 3lbs or more in 24 hours 5lbs in a week or swelling). 30 capsule 1  . ipratropium (ATROVENT) 0.06 % nasal spray as needed. (Patient not taking: Reported on 08/26/2020)    . Melatonin 5 MG TABS Take 5 mg by mouth at bedtime.     . metoprolol succinate (TOPROL-XL) 50 MG 24 hr tablet TAKE 1 TABLET BY MOUTH ONCE DAILY. TAKE WITH OR IMMEDIATELY FOLLOWING A MEAL. 90 tablet 3  . nitroGLYCERIN (NITROSTAT) 0.4 MG SL tablet Place 1 tablet (0.4 mg total) under the tongue as needed for chest pain. 25 tablet 3  . Omega-3 Fatty Acids (FISH OIL) 1000 MG CAPS Take 1 capsule by mouth daily.    Marland Kitchen omeprazole (PRILOSEC) 20 MG capsule Take 20 mg by mouth in the morning and at bedtime.    . pantoprazole (PROTONIX) 20 MG tablet Take 1 tablet (20 mg total) by mouth 2 (two) times daily. (Patient not taking: Reported on 08/26/2020) 60 tablet 11  . Probiotic Product (DAILY PROBIOTIC PO) Take by mouth daily.    . ramipril (ALTACE) 10 MG capsule TAKE (1) CAPSULE BY MOUTH ONCE DAILY. 90 capsule 0  . sildenafil (VIAGRA) 50 MG tablet Take 1 tablet (50 mg total) by mouth daily as needed for erectile dysfunction. 20 tablet 11  . SOTALOL AF 80 MG TABS Take 1 tablet (80 mg total) by mouth 2 (two) times daily. 60 tablet 6  . triamcinolone cream (KENALOG) 0.1 % Apply 1 application topically 2 (two) times daily as needed. 80 g 2  . Vitamin D, Cholecalciferol, 25 MCG (1000 UT) CAPS Take by mouth. (Patient not taking: Reported on 08/26/2020)    . Vitamin E 400 UNITS TABS Take 400 Units by mouth daily.      Social History   Socioeconomic History  . Marital status: Married    Spouse name: Neoma Laming  . Number of children: 2  . Years of education: Not on file  . Highest education level: Not on file  Occupational History  . Occupation: retired  Tobacco Use  . Smoking status: Never Smoker  . Smokeless tobacco: Never Used  Vaping Use  . Vaping Use: Never used  Substance and Sexual Activity  . Alcohol use: Yes     Alcohol/week: 2.0 standard drinks    Types: 2 Glasses of wine per week    Comment: Couple beers a day and wine with dinner.   . Drug use: No  . Sexual activity: Not on file  Other Topics Concern  . Not on file  Social History Narrative  . Not on file   Social Determinants of Health   Financial Resource Strain: Low Risk   . Difficulty of Paying Living Expenses: Not hard at all  Food Insecurity: No Food Insecurity  .  Worried About Charity fundraiser in the Last Year: Never true  . Ran Out of Food in the Last Year: Never true  Transportation Needs: No Transportation Needs  . Lack of Transportation (Medical): No  . Lack of Transportation (Non-Medical): No  Physical Activity: Inactive  . Days of Exercise per Week: 0 days  . Minutes of Exercise per Session: 0 min  Stress: No Stress Concern Present  . Feeling of Stress : Not at all  Social Connections: Moderately Integrated  . Frequency of Communication with Friends and Family: More than three times a week  . Frequency of Social Gatherings with Friends and Family: More than three times a week  . Attends Religious Services: 1 to 4 times per year  . Active Member of Clubs or Organizations: No  . Attends Archivist Meetings: Never  . Marital Status: Married  Human resources officer Violence: Not At Risk  . Fear of Current or Ex-Partner: No  . Emotionally Abused: No  . Physically Abused: No  . Sexually Abused: No   Family History  Problem Relation Age of Onset  . Dementia Mother   . Cerebral aneurysm Father   . Pneumonia Brother   . Stroke Maternal Aunt   . Diabetes Neg Hx   . Heart attack Neg Hx   . Hypertension Neg Hx   . Colon cancer Neg Hx     OBJECTIVE:  Vitals:   09/14/20 1220 09/14/20 1222  BP:  (!) 156/88  Pulse:  60  Resp:  18  Temp:  98.9 F (37.2 C)  TempSrc:  Oral  SpO2:  95%  Weight: 192 lb (87.1 kg)   Height: 5\' 8"  (1.727 m)      General appearance: alert; mildly fatigued appearing, nontoxic;  speaking in full sentences and tolerating own secretions HEENT: NCAT; Ears: EACs clear, TMs pearly gray; Eyes: PERRL.  EOM grossly intact. Nose: nares patent without rhinorrhea, Throat: oropharynx clear, tonsils non erythematous or enlarged, uvula midline  Neck: supple without LAD Lungs: unlabored respirations, symmetrical air entry; cough: mild; no respiratory distress; CTAB Heart: regular rate and rhythm.  Skin: warm and dry Psychological: alert and cooperative; normal mood and affect   ASSESSMENT & PLAN:  1. Exposure to COVID-19 virus   2. Flu-like symptoms   3. Cough   4. Runny nose     Meds ordered this encounter  Medications  . benzonatate (TESSALON) 100 MG capsule    Sig: Take 1 capsule (100 mg total) by mouth every 8 (eight) hours.    Dispense:  21 capsule    Refill:  0    Order Specific Question:   Supervising Provider    Answer:   Raylene Everts [[id number]]  . predniSONE (STERAPRED UNI-PAK 21 TAB) 10 MG (21) TBPK tablet    Sig: Take by mouth daily. Take 6 tabs by mouth daily  for 2 days, then 5 tabs for 2 days, then 4 tabs for 2 days, then 3 tabs for 2 days, 2 tabs for 2 days, then 1 tab by mouth daily for 2 days    Dispense:  42 tablet    Refill:  0    Order Specific Question:   Supervising Provider    Answer:   Raylene Everts [[id number]]   COVID testing ordered.  It will take between 5-7 days for test results.  Someone will contact you regarding abnormal results.    In the meantime: You should remain isolated in your home for 10  days from symptom onset AND greater than 72 hours after symptoms resolution (absence of fever without the use of fever-reducing medication and improvement in respiratory symptoms), whichever is longer Get plenty of rest and push fluids Tessalon Perles prescribed for cough Use OTC zyrtec for nasal congestion, runny nose, and/or sore throat Use OTC flonase for nasal congestion and runny nose Use medications daily for symptom relief Use  OTC medications like ibuprofen or tylenol as needed fever or pain Call or go to the ED if you have any new or worsening symptoms such as fever, worsening cough, shortness of breath, chest tightness, chest pain, turning blue, changes in mental status, etc...   Patient requests steroid.    Reviewed expectations re: course of current medical issues. Questions answered. Outlined signs and symptoms indicating need for more acute intervention. Patient verbalized understanding. After Visit Summary given.         Lestine Box, PA-C 09/14/20 1306

## 2020-09-14 NOTE — ED Triage Notes (Signed)
Pt c/o flu like sx day 3-no known covid exposure-had covid vaccine and flu vaccine-NAD-steady gait

## 2020-09-14 NOTE — Discharge Instructions (Signed)

## 2020-09-15 LAB — NOVEL CORONAVIRUS, NAA: SARS-CoV-2, NAA: NOT DETECTED

## 2020-09-15 LAB — SARS-COV-2, NAA 2 DAY TAT

## 2020-09-21 ENCOUNTER — Ambulatory Visit: Payer: PPO | Admitting: Internal Medicine

## 2020-09-21 ENCOUNTER — Other Ambulatory Visit: Payer: Self-pay

## 2020-09-21 ENCOUNTER — Encounter: Payer: Self-pay | Admitting: Internal Medicine

## 2020-09-21 VITALS — BP 150/76 | HR 59 | Ht 68.0 in | Wt 200.2 lb

## 2020-09-21 DIAGNOSIS — G4733 Obstructive sleep apnea (adult) (pediatric): Secondary | ICD-10-CM

## 2020-09-21 DIAGNOSIS — I255 Ischemic cardiomyopathy: Secondary | ICD-10-CM | POA: Diagnosis not present

## 2020-09-21 DIAGNOSIS — I472 Ventricular tachycardia, unspecified: Secondary | ICD-10-CM

## 2020-09-21 DIAGNOSIS — I25810 Atherosclerosis of coronary artery bypass graft(s) without angina pectoris: Secondary | ICD-10-CM | POA: Diagnosis not present

## 2020-09-21 DIAGNOSIS — I5022 Chronic systolic (congestive) heart failure: Secondary | ICD-10-CM | POA: Diagnosis not present

## 2020-09-21 DIAGNOSIS — Z95810 Presence of automatic (implantable) cardiac defibrillator: Secondary | ICD-10-CM | POA: Diagnosis not present

## 2020-09-21 LAB — CUP PACEART INCLINIC DEVICE CHECK
Battery Remaining Longevity: 38 mo
Battery Voltage: 2.96 V
Brady Statistic AP VP Percent: 0.02 %
Brady Statistic AP VS Percent: 12.72 %
Brady Statistic AS VP Percent: 0.05 %
Brady Statistic AS VS Percent: 87.22 %
Brady Statistic RA Percent Paced: 12.22 %
Brady Statistic RV Percent Paced: 0.07 %
Date Time Interrogation Session: 20211129120203
HighPow Impedance: 67 Ohm
Implantable Lead Implant Date: 20151008
Implantable Lead Implant Date: 20151008
Implantable Lead Location: 753859
Implantable Lead Location: 753860
Implantable Lead Model: 5076
Implantable Lead Model: 6935
Implantable Pulse Generator Implant Date: 20151008
Lead Channel Impedance Value: 285 Ohm
Lead Channel Impedance Value: 361 Ohm
Lead Channel Impedance Value: 475 Ohm
Lead Channel Pacing Threshold Amplitude: 1 V
Lead Channel Pacing Threshold Amplitude: 1 V
Lead Channel Pacing Threshold Pulse Width: 0.4 ms
Lead Channel Pacing Threshold Pulse Width: 0.4 ms
Lead Channel Sensing Intrinsic Amplitude: 1.9 mV
Lead Channel Sensing Intrinsic Amplitude: 8.1 mV
Lead Channel Setting Pacing Amplitude: 2.25 V
Lead Channel Setting Pacing Amplitude: 2.5 V
Lead Channel Setting Pacing Pulse Width: 0.4 ms
Lead Channel Setting Sensing Sensitivity: 0.3 mV

## 2020-09-21 LAB — BASIC METABOLIC PANEL
BUN/Creatinine Ratio: 13 (ref 10–24)
BUN: 15 mg/dL (ref 8–27)
CO2: 26 mmol/L (ref 20–29)
Calcium: 9 mg/dL (ref 8.6–10.2)
Chloride: 103 mmol/L (ref 96–106)
Creatinine, Ser: 1.2 mg/dL (ref 0.76–1.27)
GFR calc Af Amer: 71 mL/min/{1.73_m2} (ref >59)
GFR calc non Af Amer: 61 mL/min/{1.73_m2} (ref >59)
Glucose: 88 mg/dL (ref 65–99)
Potassium: 4.1 mmol/L (ref 3.5–5.2)
Sodium: 142 mmol/L (ref 134–144)

## 2020-09-21 LAB — MAGNESIUM: Magnesium: 1.7 mg/dL (ref 1.6–2.3)

## 2020-09-21 NOTE — Progress Notes (Signed)
PCP: Chesley Noon, MD Primary Cardiologist: Dr Percival Spanish Primary EP: Dr Rayann Heman  Paul [name] is a 69 y.o. male who presents today for routine electrophysiology followup.  Since last being seen in our clinic, the patient reports doing very well.  Today, he denies symptoms of palpitations, chest pain, shortness of breath,  lower extremity edema, dizziness, presyncope, syncope, or ICD shocks.  The patient is otherwise without complaint today.   Past Medical History:  Diagnosis Date  . Basal cell carcinoma 07/17/2017   nod-L cheek (CX35FU)  . BPH (benign prostatic hyperplasia)   . CAD (coronary artery disease)    widely patent LAD LIMA graft, widely patent PDA PLR SVG, occluded Diag 1 OM 1 SVG by cardiac CT 2007   . CHF (congestive heart failure) ([place])    ECHO 03/2019: EF 30-35%  . Fatty liver   . GERD (gastroesophageal reflux disease)   . Hyperlipidemia   . IBS (irritable bowel syndrome)   . 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 Ischemic cardiomyopathy    Prior inferior infarct EF 35% by ECHO 07/13/11 ; ECHO 03/2019: EF 30-35%  . S/P CABG (coronary artery bypass graft)    CABG 06/1998 [place], [place] with LIMA to LAD, SVG to OM, SVG to PD-PL.   OM graft occluded 10/12/2005 by CTA   . SCC (squamous cell carcinoma) 11/11/2016   in situ- R anti helix (CX35FU), well diff-R upper crust of helix (CX35FU)  . Sleep apnea   . Ventricular tachycardia ([place]) 10/15   VT at 188 bpm- unstable requiring external defibrillation   Past Surgical History:  Procedure Laterality Date  . APPENDECTOMY    . COLONOSCOPY  06/11/2010   [place]; mild nonspecific erythematous mucosa in the descending colon s/p biopsied, diverticulosis, internal hemorrhoids.  Pathology with focal active colitis, nonspecific, main consideration includes acute infectious colitis.  NSAID induced colitis and/or Crohn's disease also in the differential but less likely.  . CORONARY ARTERY BYPASS  GRAFT  1999  . IMPLANTABLE CARDIOVERTER DEFIBRILLATOR IMPLANT N/A 07/31/2014   MDT Gwyneth Revels XT DR ICD implanted by Dr Rayann Heman for secondary treatent of VT  . LEFT HEART CATHETERIZATION WITH CORONARY/GRAFT ANGIOGRAM N/A 07/30/2014   Procedure: LEFT HEART CATHETERIZATION WITH Beatrix Fetters;  Surgeon: Jacolyn Reedy, MD;  Location: [facility] CATH LAB;  Service: Cardiovascular;  Laterality: N/A;    ROS- all systems are reviewed and negative except as per HPI above  Current Outpatient Medications  Medication Sig Dispense Refill  . Ascorbic Acid (VITAMIN C) 1000 MG tablet Take 1,000 mg by mouth daily.    Marland Kitchen atorvastatin (LIPITOR) 80 MG tablet TAKE (1) TABLET BY MOUTH AT BEDTIME. 90 tablet 1  . azelastine (OPTIVAR) 0.05 % ophthalmic solution Place 1 drop into both eyes as needed.     . benzonatate (TESSALON) 100 MG capsule Take 1 capsule (100 mg total) by mouth every 8 (eight) hours. 21 capsule 0  . cetirizine (ZYRTEC) 10 MG tablet Take 10 mg by mouth daily.    . clopidogrel (PLAVIX) 75 MG tablet Take 1 tablet (75 mg total) by mouth daily. 90 tablet 3  . Coenzyme Q10 50 MG CAPS Take 1 capsule by mouth daily.    Marland Kitchen eplerenone (INSPRA) 25 MG tablet TAKE (1) TABLET BY MOUTH ONCE DAILY. 90 tablet 0  . fenofibrate 160 MG tablet Take 160 mg by mouth daily.    . fluticasone (FLONASE) 50 MCG/ACT nasal spray Place 2 sprays into both nostrils daily. 16 g  6  . folic acid (FOLVITE) 737 MCG tablet Take 800 mcg by mouth daily.     Marland Kitchen ipratropium (ATROVENT) 0.06 % nasal spray as needed.     . Melatonin 5 MG TABS Take 5 mg by mouth at bedtime.     . metoprolol succinate (TOPROL-XL) 50 MG 24 hr tablet TAKE 1 TABLET BY MOUTH ONCE DAILY. TAKE WITH OR IMMEDIATELY FOLLOWING A MEAL. 90 tablet 3  . nitroGLYCERIN (NITROSTAT) 0.4 MG SL tablet Place 1 tablet (0.4 mg total) under the tongue as needed for chest pain. 25 tablet 3  . Omega-3 Fatty Acids (FISH OIL) 1000 MG CAPS Take 1 capsule by mouth daily.    Marland Kitchen omeprazole  (PRILOSEC) 20 MG capsule Take 20 mg by mouth in the morning and at bedtime.    . pantoprazole (PROTONIX) 20 MG tablet Take 1 tablet (20 mg total) by mouth 2 (two) times daily. 60 tablet 11  . predniSONE (STERAPRED UNI-PAK 21 TAB) 10 MG (21) TBPK tablet Take by mouth daily. Take 6 tabs by mouth daily  for 2 days, then 5 tabs for 2 days, then 4 tabs for 2 days, then 3 tabs for 2 days, 2 tabs for 2 days, then 1 tab by mouth daily for 2 days 42 tablet 0  . ramipril (ALTACE) 10 MG capsule TAKE (1) CAPSULE BY MOUTH ONCE DAILY. 90 capsule 0  . sildenafil (VIAGRA) 50 MG tablet Take 1 tablet (50 mg total) by mouth daily as needed for erectile dysfunction. 20 tablet 11  . SOTALOL AF 80 MG TABS Take 1 tablet (80 mg total) by mouth 2 (two) times daily. 60 tablet 6  . triamcinolone cream (KENALOG) 0.1 % Apply 1 application topically 2 (two) times daily as needed. 80 g 2  . Vitamin D, Cholecalciferol, 25 MCG (1000 UT) CAPS Take by mouth.     . Vitamin E 400 UNITS TABS Take 400 Units by mouth daily.     . hydrochlorothiazide (MICROZIDE) 12.5 MG capsule Take 1 capsule (12.5 mg total) by mouth as needed (weight gain 3lbs or more in 24 hours 5lbs in a week or swelling). 30 capsule 1   No current facility-administered medications for this visit.    Physical Exam: Vitals:   09/21/20 1151  BP: (!) 150/76  Pulse: (!) 59  SpO2: 96%  Weight: 200 lb 3.2 oz (90.8 kg)  Height: 5\' 8"  (1.727 m)    GEN- The patient is well appearing, alert and oriented x 3 today.   Head- normocephalic, atraumatic Eyes-  Sclera clear, conjunctiva pink Ears- hearing intact Oropharynx- clear Lungs- Clear to ausculation bilaterally, normal work of breathing Chest- ICD pocket is well healed Heart- Regular rate and rhythm, no murmurs, rubs or gallops, PMI not laterally displaced GI- soft, NT, ND, + BS Extremities- no clubbing, cyanosis, or edema  ICD interrogation- reviewed in detail today,  See PACEART report  ekg tracing ordered  today is personally reviewed and shows sinus rhythm 59 bpm, PR 186 msec, QRS 128 msec, Qtc 473 msec  Wt Readings from Last 3 Encounters:  09/21/20 200 lb 3.2 oz (90.8 kg)  09/14/20 192 lb (87.1 kg)  08/26/20 198 lb 6.4 oz (90 kg)    Assessment and Plan:  1.  Chronic systolic dysfunction/ ischemic CM/ ACD euvolemic today No ischemic symptoms Stable on an appropriate medical regimen Normal ICD function See [facility] report No changes today he is not device dependant today followed in ICM device clinic  2. VT  Well controlled with sotalol Check labs today We will need to follow him closely on sotalol to avoid toxicity  3. OSA Uses a dental appliance  Risks, benefits and potential toxicities for medications prescribed and/or refilled reviewed with patient today.   Return to see EP NP in 22months I will see in a year  Thompson Grayer MD, [facility] 09/21/2020 11:59 AM

## 2020-09-21 NOTE — Patient Instructions (Addendum)
Medication Instructions:  Your physician recommends that you continue on your current medications as directed. Please refer to the Current Medication list given to you today.  *If you need a refill on your cardiac medications before your next appointment, please call your pharmacy*  Lab Work: BMET, Mg  If you have labs (blood work) drawn today and your tests are completely normal, you will receive your results only by: Marland Kitchen MyChart Message (if you have MyChart) OR . A paper copy in the mail If you have any lab test that is abnormal or we need to change your treatment, we will call you to review the results.  Testing/Procedures: None ordered.  Follow-Up: At [facility], you and your health needs are our priority.  As part of our continuing mission to provide you with exceptional heart care, we have created designated Provider Care Teams.  These Care Teams include your primary Cardiologist (physician) and Advanced Practice Providers (APPs -  Physician Assistants and Nurse Practitioners) who all work together to provide you with the care you need, when you need it.  We recommend signing up for the patient portal called "MyChart".  Sign up information is provided on this After Visit Summary.  MyChart is used to connect with patients for Virtual Visits (Telemedicine).  Patients are able to view lab/test results, encounter notes, upcoming appointments, etc.  Non-urgent messages can be sent to your provider as well.   To learn more about what you can do with MyChart, go to [email or web address].    Your next appointment:   Your physician wants you to follow-up in:  6 months with [place] 1 year with Dr. Rayann Heman. You will receive a reminder letter in the mail two months in advance. If you don't receive a letter, please call our office to schedule the follow-up appointment.  Remote monitoring is used to monitor your Pacemaker or ICD from home. This monitoring reduces the number of office  visits required to check your device to one time per year. It allows [place] to keep an eye on the functioning of your device to ensure it is working properly. You are scheduled for a device check from home on 10/07/20 . You may send your transmission at any time that day. If you have a wireless device, the transmission will be sent automatically. After your physician reviews your transmission, you will receive a postcard with your next transmission date.  Other Instructions:

## 2020-09-24 ENCOUNTER — Telehealth: Payer: Self-pay

## 2020-09-24 ENCOUNTER — Telehealth: Payer: Self-pay | Admitting: Gastroenterology

## 2020-09-24 NOTE — Telephone Encounter (Signed)
I don't see any reason he could not have a colonoscopy.

## 2020-09-24 NOTE — Telephone Encounter (Signed)
Message sent to pts doctor.

## 2020-09-24 NOTE — Telephone Encounter (Signed)
Paul [name], can we follow-up on cardiac clearance for colonoscopy? Patient had visit with cardiology in November. Looks like he saw Dr. Minus Breeding on 11/3.

## 2020-09-24 NOTE — Telephone Encounter (Signed)
Will await further instructions from [place] to schedule procedure.

## 2020-09-24 NOTE — Telephone Encounter (Signed)
Noted. Routing to Aliene Altes, [place] & [facility].

## 2020-09-24 NOTE — Telephone Encounter (Signed)
Dr. Percival Spanish,   Pt needs a colonoscopy and Aliene Altes, PA is asking for cardiac clearance. Please advise.

## 2020-09-25 NOTE — Telephone Encounter (Signed)
Spoke with patient. Stated he was establishing with [facility] practice in [place] and would be following with them moving forward. No need to arrange colonoscopy at this point.

## 2020-09-25 NOTE — Telephone Encounter (Signed)
In reviewing patient's chart, I see he has an appointment scheduled with [facility] on 12/7. I tried reaching out to patient this morning to verify whether or not he is transitioning/establishing care with another GI provider. I had to leave a voice mail. Requested return call.

## 2020-09-26 NOTE — Telephone Encounter (Signed)
Reviewed

## 2020-09-28 NOTE — Telephone Encounter (Signed)
noted 

## 2020-10-05 ENCOUNTER — Ambulatory Visit: Payer: PPO | Admitting: Gastroenterology

## 2020-10-07 ENCOUNTER — Ambulatory Visit (INDEPENDENT_AMBULATORY_CARE_PROVIDER_SITE_OTHER): Payer: PPO

## 2020-10-07 DIAGNOSIS — I5022 Chronic systolic (congestive) heart failure: Secondary | ICD-10-CM

## 2020-10-07 DIAGNOSIS — Z95810 Presence of automatic (implantable) cardiac defibrillator: Secondary | ICD-10-CM | POA: Diagnosis not present

## 2020-10-09 NOTE — Progress Notes (Signed)
EPIC Encounter for ICM Monitoring  Patient Name: Paul [name] is a 70 y.o. male Date: 10/09/2020 Primary Care Physican: Chesley Noon, MD Primary Cardiologist:Hoch[place] Electrophysiologist: Allred 11/29/2021Office Weight:200lbs   Spoke with patient and reports feeling well at this time. Denies fluid symptoms.  Optivol thoracic impedance normal.  Prescribed:Hydrochlorothiazide 12.5 mgTake 1 capsule (12.5 mg total) by mouth as needed (weight gain 3lbs or more in 24 hours 5lbs in a week or swelling).  Labs: 09/21/2020 Creatinine 1.20, BUN 15, Potassium 4.1, Sodium 142, GFR 61-71 02/26/2020 Creatinine 1.17, BUN 9,   Potassium 5.0, Sodium 142, GFR 63-73  Recommendations:No changes and encouraged to call if experiencing any fluid symptoms.  Follow-up plan: ICM clinic phone appointment on1/18/2022. 91 day device clinic remote transmission 01/06/2021.   EP/Cardiology Office Visits: Recall 02/22/2021 with Dr Percival Spanish.  Recall 09/18/2021 with Dr Rayann Heman.  Copy of ICM check sent to Dr.Allred.   3 month ICM trend: 10/07/2020    1 Year ICM trend:       [name], RN 10/09/2020 3:09 PM

## 2020-11-02 ENCOUNTER — Ambulatory Visit: Payer: PPO | Admitting: Internal Medicine

## 2020-11-02 ENCOUNTER — Encounter: Payer: Self-pay | Admitting: Internal Medicine

## 2020-11-02 ENCOUNTER — Other Ambulatory Visit: Payer: Self-pay

## 2020-11-02 ENCOUNTER — Ambulatory Visit (INDEPENDENT_AMBULATORY_CARE_PROVIDER_SITE_OTHER): Payer: PPO | Admitting: Internal Medicine

## 2020-11-02 VITALS — BP 155/92 | HR 69 | Resp 16 | Ht 68.5 in | Wt 195.1 lb

## 2020-11-02 DIAGNOSIS — K649 Unspecified hemorrhoids: Secondary | ICD-10-CM | POA: Diagnosis not present

## 2020-11-02 DIAGNOSIS — Z951 Presence of aortocoronary bypass graft: Secondary | ICD-10-CM

## 2020-11-02 DIAGNOSIS — I472 Ventricular tachycardia, unspecified: Secondary | ICD-10-CM

## 2020-11-02 DIAGNOSIS — R197 Diarrhea, unspecified: Secondary | ICD-10-CM

## 2020-11-02 DIAGNOSIS — G4733 Obstructive sleep apnea (adult) (pediatric): Secondary | ICD-10-CM

## 2020-11-02 DIAGNOSIS — Z95810 Presence of automatic (implantable) cardiac defibrillator: Secondary | ICD-10-CM | POA: Diagnosis not present

## 2020-11-02 DIAGNOSIS — E785 Hyperlipidemia, unspecified: Secondary | ICD-10-CM | POA: Diagnosis not present

## 2020-11-02 DIAGNOSIS — I5020 Unspecified systolic (congestive) heart failure: Secondary | ICD-10-CM

## 2020-11-02 DIAGNOSIS — Z7689 Persons encountering health services in other specified circumstances: Secondary | ICD-10-CM | POA: Diagnosis not present

## 2020-11-02 DIAGNOSIS — K219 Gastro-esophageal reflux disease without esophagitis: Secondary | ICD-10-CM

## 2020-11-02 DIAGNOSIS — I25810 Atherosclerosis of coronary artery bypass graft(s) without angina pectoris: Secondary | ICD-10-CM

## 2020-11-02 NOTE — Assessment & Plan Note (Signed)
Has tried Preparation H, Epsom salt and sitz bath Advised to follow up with GI

## 2020-11-02 NOTE — Assessment & Plan Note (Signed)
Ischemic cardiomyopathy with EF of 30 to 35% Has AICD in place On metoprolol, ramipril and eplerenone Euvolemic, follows up with cardiologist

## 2020-11-02 NOTE — Assessment & Plan Note (Signed)
Has history of IBS-D Uses Imodium as needed Follows with GI Needs colonoscopy

## 2020-11-02 NOTE — Assessment & Plan Note (Addendum)
On Lipitor, fenofibrate and omega-3

## 2020-11-02 NOTE — Assessment & Plan Note (Signed)
History of V. tach On sotalol, stable

## 2020-11-02 NOTE — Assessment & Plan Note (Signed)
On omeprazole.  

## 2020-11-02 NOTE — Assessment & Plan Note (Signed)
Used to use CPAP Now uses dental implant

## 2020-11-02 NOTE — Assessment & Plan Note (Signed)
Care established Previous chart reviewed History and medications reviewed with the patient 

## 2020-11-02 NOTE — Progress Notes (Signed)
New Patient Office Visit  Subjective:  Patient ID: Paul [name], male    DOB: 1950/12/13  Age: 70 y.o. MRN: [id number]  CC:  Chief Complaint  Patient presents with  . New Patient (Initial Visit)    Establish care    HPI Paul [name] is a 70 year old male with past medical history of CAD status post CABG and stent placement, ischemic cardiomyopathy, HFrEF s/p AICD placement, hyperlipidemia, IBS-D, GERD and OSA who presents for establishing care.  Patient states that he has been in stable health overall.  He has extensive cardiac history for which he follows up with cardiologist and EP cardiologist.  He has a history of chronic diarrhea due to IBS-D, for which he used to follow-up with GI.  He has a h/o hemorrhoids. He states that a hemorrhoid was disturbing him about 2 weeks ago, which ruptured and led to bleeding. Since then, he has not had bleeding, but it has been causing discomfort.  He has tried Preparation H, Epsom salt and sitz bath.  Last colonoscopy in 2011.  Patient is up-to-date with COVID vaccine and flu vaccine. He has had PCV13 and PPSV23.  Past Medical History:  Diagnosis Date  . Basal cell carcinoma 07/17/2017   nod-L cheek (CX35FU)  . BPH (benign prostatic hyperplasia)   . CAD (coronary artery disease)    widely patent LAD LIMA graft, widely patent PDA PLR SVG, occluded Diag 1 OM 1 SVG by cardiac CT 2007   . CHF (congestive heart failure) ([place])    ECHO 03/2019: EF 30-35%  . Fatty liver   . GERD (gastroesophageal reflux disease)   . Hyperlipidemia   . IBS (irritable bowel syndrome)   . 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 Ischemic cardiomyopathy    Prior inferior infarct EF 35% by ECHO 07/13/11 ; ECHO 03/2019: EF 30-35%  . S/P CABG (coronary artery bypass graft)    CABG 06/1998 [place], [place] with LIMA to LAD, SVG to OM, SVG to PD-PL.   OM graft occluded 10/12/2005 by CTA   . SCC (squamous cell carcinoma) 11/11/2016   in  situ- R anti helix (CX35FU), well diff-R upper crust of helix (CX35FU)  . Sleep apnea   . Ventricular tachycardia ([place]) 10/15   VT at 188 bpm- unstable requiring external defibrillation    Past Surgical History:  Procedure Laterality Date  . APPENDECTOMY    . COLONOSCOPY  06/11/2010   [place]; mild nonspecific erythematous mucosa in the descending colon s/p biopsied, diverticulosis, internal hemorrhoids.  Pathology with focal active colitis, nonspecific, main consideration includes acute infectious colitis.  NSAID induced colitis and/or Crohn's disease also in the differential but less likely.  . CORONARY ARTERY BYPASS GRAFT  1999  . IMPLANTABLE CARDIOVERTER DEFIBRILLATOR IMPLANT N/A 07/31/2014   MDT Gwyneth Revels XT DR ICD implanted by Dr Rayann Heman for secondary treatent of VT  . LEFT HEART CATHETERIZATION WITH CORONARY/GRAFT ANGIOGRAM N/A 07/30/2014   Procedure: LEFT HEART CATHETERIZATION WITH Beatrix Fetters;  Surgeon: Jacolyn Reedy, MD;  Location: [facility] CATH LAB;  Service: Cardiovascular;  Laterality: N/A;    Family History  Problem Relation Age of Onset  . Dementia Mother   . Cerebral aneurysm Father   . Pneumonia Brother   . Stroke Maternal Aunt   . Diabetes Neg Hx   . Heart attack Neg Hx   . Hypertension Neg Hx   . Colon cancer Neg Hx     Social History   Socioeconomic History  . Marital  status: Married    Spouse name: Neoma Laming  . Number of children: 2  . Years of education: Not on file  . Highest education level: Not on file  Occupational History  . Occupation: retired  Tobacco Use  . Smoking status: Never Smoker  . Smokeless tobacco: Never Used  Vaping Use  . Vaping Use: Never used  Substance and Sexual Activity  . Alcohol use: Yes    Alcohol/week: 2.0 standard drinks    Types: 2 Glasses of wine per week    Comment: Couple beers a day and wine with dinner.   . Drug use: No  . Sexual activity: Not on file  Other Topics Concern  . Not on file  Social History  Narrative  . Not on file   Social Determinants of Health   Financial Resource Strain: Not on file  Food Insecurity: Not on file  Transportation Needs: Not on file  Physical Activity: Not on file  Stress: Not on file  Social Connections: Not on file  Intimate Partner Violence: Not on file    ROS Review of Systems  Constitutional: Negative for chills and fever.  HENT: Negative for congestion and sore throat.   Eyes: Negative for pain and discharge.  Respiratory: Negative for cough and shortness of breath.   Cardiovascular: Negative for chest pain and palpitations.  Gastrointestinal: Positive for anal bleeding and blood in stool. Negative for constipation, diarrhea, nausea and vomiting.  Endocrine: Negative for polydipsia and polyuria.  Genitourinary: Negative for dysuria and hematuria.  Musculoskeletal: Negative for neck pain and neck stiffness.  Skin: Negative for rash.  Neurological: Negative for dizziness, weakness, numbness and headaches.  Psychiatric/Behavioral: Negative for agitation and behavioral problems.    Objective:   Today's Vitals: BP (!) 155/92   Pulse 69   Resp 16   Ht 5' 8.5" (1.74 m)   Wt 195 lb 1.9 oz (88.5 kg)   SpO2 98%   BMI 29.24 kg/m   Physical Exam Vitals reviewed.  Constitutional:      General: He is not in acute distress.    Appearance: He is not diaphoretic.  HENT:     Head: Normocephalic and atraumatic.     Nose: Nose normal.     Mouth/Throat:     Mouth: Mucous membranes are moist.  Eyes:     General: No scleral icterus.    Extraocular Movements: Extraocular movements intact.     Pupils: Pupils are equal, round, and reactive to light.  Cardiovascular:     Rate and Rhythm: Normal rate and regular rhythm.     Pulses: Normal pulses.     Heart sounds: Normal heart sounds. No murmur heard.   Pulmonary:     Breath sounds: Normal breath sounds. No wheezing or rales.  Abdominal:     Palpations: Abdomen is soft.     Tenderness: There  is no abdominal tenderness.  Musculoskeletal:     Cervical back: Neck supple. No tenderness.     Right lower leg: No edema.     Left lower leg: No edema.  Skin:    General: Skin is warm.     Findings: No rash.  Neurological:     General: No focal deficit present.     Mental Status: He is alert and oriented to person, place, and time.     Sensory: No sensory deficit.     Motor: No weakness.  Psychiatric:        Mood and Affect: Mood normal.  Behavior: Behavior normal.     Assessment & Plan:   Problem List Items Addressed This Visit      Cardiovascular and Mediastinum   CAD (coronary artery disease) (Chronic)    S/p CABG and stent placement On Plavix and statin On beta-blocker Follows up with cardiologist      Ventricular tachycardia ([place])    History of V. tach On sotalol, stable      HFrEF (heart failure with reduced ejection fraction) (HCC)    Ischemic cardiomyopathy with EF of 30 to 35% Has AICD in place On metoprolol, ramipril and eplerenone Euvolemic, follows up with cardiologist       Hemorrhoids    Has tried Preparation H, Epsom salt and sitz bath Advised to follow up with GI        Respiratory   OSA (obstructive sleep apnea)    Used to use CPAP Now uses dental implant        Digestive   GERD (gastroesophageal reflux disease)    On omeprazole        Other   S/P CABG (coronary artery bypass graft) (Chronic)   Hyperlipidemia (Chronic)    On Lipitor, fenofibrate and omega-3      ICD (implantable cardioverter-defibrillator) in place   Diarrhea    Has history of IBS-D Uses Imodium as needed Follows with GI Needs colonoscopy      Encounter to establish care - Primary    Care established Previous chart reviewed History and medications reviewed with the patient         Outpatient Encounter Medications as of 11/02/2020  Medication Sig  . Ascorbic Acid (VITAMIN C) 1000 MG tablet Take 1,000 mg by mouth daily.  Marland Kitchen atorvastatin  (LIPITOR) 80 MG tablet TAKE (1) TABLET BY MOUTH AT BEDTIME.  Marland Kitchen azelastine (OPTIVAR) 0.05 % ophthalmic solution Place 1 drop into both eyes as needed.   . benzonatate (TESSALON) 100 MG capsule Take 1 capsule (100 mg total) by mouth every 8 (eight) hours.  . cetirizine (ZYRTEC) 10 MG tablet Take 10 mg by mouth daily.  . clopidogrel (PLAVIX) 75 MG tablet Take 1 tablet (75 mg total) by mouth daily.  . Coenzyme Q10 50 MG CAPS Take 1 capsule by mouth daily.  Marland Kitchen eplerenone (INSPRA) 25 MG tablet TAKE (1) TABLET BY MOUTH ONCE DAILY.  . fenofibrate 160 MG tablet Take 160 mg by mouth daily.  . fluticasone (FLONASE) 50 MCG/ACT nasal spray Place 2 sprays into both nostrils daily.  . folic acid (FOLVITE) Q000111Q MCG tablet Take 800 mcg by mouth daily.   . hydrochlorothiazide (MICROZIDE) 12.5 MG capsule Take 1 capsule (12.5 mg total) by mouth as needed (weight gain 3lbs or more in 24 hours 5lbs in a week or swelling).  . Melatonin 5 MG TABS Take 5 mg by mouth at bedtime.   . metoprolol succinate (TOPROL-XL) 50 MG 24 hr tablet TAKE 1 TABLET BY MOUTH ONCE DAILY. TAKE WITH OR IMMEDIATELY FOLLOWING A MEAL.  . nitroGLYCERIN (NITROSTAT) 0.4 MG SL tablet Place 1 tablet (0.4 mg total) under the tongue as needed for chest pain.  . Omega-3 Fatty Acids (FISH OIL) 1000 MG CAPS Take 1 capsule by mouth daily.  Marland Kitchen omeprazole (PRILOSEC) 20 MG capsule Take 20 mg by mouth in the morning and at bedtime.  . ramipril (ALTACE) 10 MG capsule TAKE (1) CAPSULE BY MOUTH ONCE DAILY.  . sildenafil (VIAGRA) 50 MG tablet Take 1 tablet (50 mg total) by mouth daily as needed for erectile  dysfunction.  Marland Kitchen SOTALOL AF 80 MG TABS Take 1 tablet (80 mg total) by mouth 2 (two) times daily.  Marland Kitchen triamcinolone cream (KENALOG) 0.1 % Apply 1 application topically 2 (two) times daily as needed.  . Vitamin D, Cholecalciferol, 25 MCG (1000 UT) CAPS Take by mouth.   . Vitamin E 400 UNITS TABS Take 400 Units by mouth daily.   . [DISCONTINUED] ipratropium (ATROVENT)  0.06 % nasal spray as needed.   . [DISCONTINUED] pantoprazole (PROTONIX) 20 MG tablet Take 1 tablet (20 mg total) by mouth 2 (two) times daily.  . [DISCONTINUED] predniSONE (STERAPRED UNI-PAK 21 TAB) 10 MG (21) TBPK tablet Take by mouth daily. Take 6 tabs by mouth daily  for 2 days, then 5 tabs for 2 days, then 4 tabs for 2 days, then 3 tabs for 2 days, 2 tabs for 2 days, then 1 tab by mouth daily for 2 days   No facility-administered encounter medications on file as of 11/02/2020.    Follow-up: Return in about 3 months (around 01/31/2021).   Lindell Spar, MD

## 2020-11-02 NOTE — Patient Instructions (Addendum)
Please continue to take medications as prescribed.  Please continue to follow low sodium diet and perform exercise/walking as tolerated.  Please follow up with GI as scheduled for diarrhea and hemorrhoid. Continue to apply Preparation H and take sitz bath for symptomatic relief.  Thank you for choosing [facility]! We consider it out privilege to take care of you!

## 2020-11-02 NOTE — Assessment & Plan Note (Signed)
S/p CABG and stent placement On Plavix and statin On beta-blocker Follows up with cardiologist

## 2020-11-06 NOTE — Telephone Encounter (Signed)
Called the patient and talked to him about when his medication was changed and why.  Per MD note:  01/16/2019 1.  VT Last episode 11/13/15 Doing well with amiodarone 100mg  every other day (recently reduced by EP NP due to hyperthyroidism I have advised stopping amiodarone given persistent hyperthyroidism.  He is very anxious about this.  He would prefer to start another AAD. I will therefore stop amiodarone today. After 72 hours, start sotalol 80mg  BID Reduce toprol to 50mg  daily today  Patient unable to give current BP or HR.  Advised him to reach out to PCP or general cardiology to assist with elevated BP. Also to get a BP machine for home so he can give some daily readings.   Will forward to Dr. Rayann Heman for any further advisement.

## 2020-11-10 ENCOUNTER — Ambulatory Visit (INDEPENDENT_AMBULATORY_CARE_PROVIDER_SITE_OTHER): Payer: PPO

## 2020-11-10 DIAGNOSIS — Z95810 Presence of automatic (implantable) cardiac defibrillator: Secondary | ICD-10-CM

## 2020-11-10 DIAGNOSIS — I5022 Chronic systolic (congestive) heart failure: Secondary | ICD-10-CM

## 2020-11-10 NOTE — Progress Notes (Signed)
EPIC Encounter for ICM Monitoring  Patient Name: Paul [name] is a 70 y.o. male Date: 11/10/2020 Primary Care Physican: No primary care provider on file. Primary Cardiologist:Hochrein Electrophysiologist: Allred 11/10/2020 Weight:192lbs   Spoke with patient and reports feeling well at this time. Denies fluid symptoms.He reports he ate country ham for breakfast in the last couple of days which may contribute to decreased impedance.  Optivol thoracic impedance suggesting possible fluid accumulation in the last 2 days.  Prescribed:Hydrochlorothiazide 12.5 mgTake 1 capsule (12.5 mg total) by mouth as needed (weight gain 3lbs or more in 24 hours 5lbs in a week or swelling).  Labs: 09/21/2020 Creatinine 1.20, BUN 15, Potassium 4.1, Sodium 142, GFR 61-71 02/26/2020 Creatinine 1.17, BUN 9,   Potassium 5.0, Sodium 142, GFR 63-73  Recommendations:He is taking PRN Hydrochlorothiazide x 1 - 2 days.  Follow-up plan: ICM clinic phone appointment on2/22/2022. 91 day device clinic remote transmission3/16/2022.   EP/Cardiology Office Visits: Recall 02/22/2021 with Dr Percival Spanish.  Recall 11/26/2022with Dr Rayann Heman.  Copy of ICM check sent to Dr.Allred.  3 month ICM trend: 11/10/2020.    1 Year ICM trend:       [name], RN 11/10/2020 10:04 AM

## 2020-11-16 DIAGNOSIS — K589 Irritable bowel syndrome without diarrhea: Secondary | ICD-10-CM | POA: Diagnosis not present

## 2020-11-16 DIAGNOSIS — Z1211 Encounter for screening for malignant neoplasm of colon: Secondary | ICD-10-CM | POA: Diagnosis not present

## 2020-11-16 DIAGNOSIS — R140 Abdominal distension (gaseous): Secondary | ICD-10-CM | POA: Diagnosis not present

## 2020-11-16 DIAGNOSIS — K644 Residual hemorrhoidal skin tags: Secondary | ICD-10-CM | POA: Diagnosis not present

## 2020-11-19 ENCOUNTER — Other Ambulatory Visit: Payer: Self-pay

## 2020-11-19 ENCOUNTER — Ambulatory Visit
Admission: RE | Admit: 2020-11-19 | Discharge: 2020-11-19 | Disposition: A | Discharge status: Home / Self Care | Payer: PPO | Source: Ambulatory Visit | Attending: Emergency Medicine | Admitting: Emergency Medicine

## 2020-11-19 VITALS — BP 145/92 | HR 67 | Temp 98.2°F | Resp 18

## 2020-11-19 DIAGNOSIS — J069 Acute upper respiratory infection, unspecified: Secondary | ICD-10-CM | POA: Diagnosis not present

## 2020-11-19 DIAGNOSIS — Z20822 Contact with and (suspected) exposure to covid-19: Secondary | ICD-10-CM | POA: Diagnosis not present

## 2020-11-19 DIAGNOSIS — U071 COVID-19: Secondary | ICD-10-CM

## 2020-11-19 DIAGNOSIS — Z7689 Persons encountering health services in other specified circumstances: Secondary | ICD-10-CM | POA: Diagnosis not present

## 2020-11-19 MED ORDER — PREDNISONE 20 MG PO TABS: 40.0000 mg | ORAL_TABLET | Freq: Every day | ORAL | 0 refills | Status: AC

## 2020-11-19 MED ORDER — BENZONATATE 100 MG PO CAPS
100.0000 mg | ORAL_CAPSULE | Freq: Three times a day (TID) | ORAL | 0 refills | Status: DC
Start: 2020-11-19 — End: 2021-03-16

## 2020-11-19 NOTE — Discharge Instructions (Addendum)
discontinue Zyrtec, start Mucinex D or Mucinex DM.  Continue Flonase.  Start saline nasal irrigation with a Milta Deiters med sinus rinse and distilled water as often as you will, Tessalon for the cough.  Wait-and-see prescription of prednisone-it has a lot of adverse effects from your entire body, so I would wait a few days before starting this.

## 2020-11-19 NOTE — ED Triage Notes (Signed)
Pt presents with c/o cough and runny nose that began this morning.

## 2020-11-19 NOTE — ED Provider Notes (Signed)
HPI  SUBJECTIVE:  Paul [name] is a 70 y.o. male who presents with nasal congestion, cough, nausea starting today.  He reports diarrhea this morning.  No fevers, body, headaches, postnasal drip, sore throat, loss of smell or taste, wheezing, chest pain or shortness of breath, nausea, vomiting, abdominal pain.  No known Covid exposure.  He got the booster shot on 08/24/2020.  No allergy symptoms.  No antibiotics in the past month, no antipyretic in the past 6 hours.  He is on Zyrtec and Flonase daily for allergies.  No aggravating or alleviating factors.  He has a past medical history of hypertension, coronary artery disease,, status post CABG on Plavix, ventricular tachycardia with a defibrillator, ischemic cardiomyopathy, CHF, IBS.  No history of pulmonary disease, smoking, diabetes.  HGD:[facility], [facility]   Past Medical History:  Diagnosis Date  . Basal cell carcinoma 07/17/2017   nod-L cheek (CX35FU)  . BPH (benign prostatic hyperplasia)   . CAD (coronary artery disease)    widely patent LAD LIMA graft, widely patent PDA PLR SVG, occluded Diag 1 OM 1 SVG by cardiac CT 2007   . CHF (congestive heart failure) ([place])    ECHO 03/2019: EF 30-35%  . Fatty liver   . GERD (gastroesophageal reflux disease)   . Hyperlipidemia   . IBS (irritable bowel syndrome)   . 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 Ischemic cardiomyopathy    Prior inferior infarct EF 35% by ECHO 07/13/11 ; ECHO 03/2019: EF 30-35%  . S/P CABG (coronary artery bypass graft)    CABG 06/1998 [place], [place] with LIMA to LAD, SVG to OM, SVG to PD-PL.   OM graft occluded 10/12/2005 by CTA   . SCC (squamous cell carcinoma) 11/11/2016   in situ- R anti helix (CX35FU), well diff-R upper crust of helix (CX35FU)  . Sleep apnea   . Ventricular tachycardia ([place]) 10/15   VT at 188 bpm- unstable requiring external defibrillation    Past Surgical History:  Procedure Laterality Date  .  APPENDECTOMY    . COLONOSCOPY  06/11/2010   [place]; mild nonspecific erythematous mucosa in the descending colon s/p biopsied, diverticulosis, internal hemorrhoids.  Pathology with focal active colitis, nonspecific, main consideration includes acute infectious colitis.  NSAID induced colitis and/or Crohn's disease also in the differential but less likely.  . CORONARY ARTERY BYPASS GRAFT  1999  . IMPLANTABLE CARDIOVERTER DEFIBRILLATOR IMPLANT N/A 07/31/2014   MDT Gwyneth Revels XT DR ICD implanted by Dr Rayann Heman for secondary treatent of VT  . LEFT HEART CATHETERIZATION WITH CORONARY/GRAFT ANGIOGRAM N/A 07/30/2014   Procedure: LEFT HEART CATHETERIZATION WITH Beatrix Fetters;  Surgeon: Jacolyn Reedy, MD;  Location: [facility] CATH LAB;  Service: Cardiovascular;  Laterality: N/A;    Family History  Problem Relation Age of Onset  . Dementia Mother   . Cerebral aneurysm Father   . Pneumonia Brother   . Stroke Maternal Aunt   . Diabetes Neg Hx   . Heart attack Neg Hx   . Hypertension Neg Hx   . Colon cancer Neg Hx     Social History   Tobacco Use  . Smoking status: Never Smoker  . Smokeless tobacco: Never Used  Vaping Use  . Vaping Use: Never used  Substance Use Topics  . Alcohol use: Yes    Alcohol/week: 2.0 standard drinks    Types: 2 Glasses of wine per week    Comment: Couple beers a day and wine with dinner.   Marland Kitchen  Drug use: No    No current facility-administered medications for this encounter.  Current Outpatient Medications:  .  predniSONE (DELTASONE) 20 MG tablet, Take 2 tablets (40 mg total) by mouth daily with breakfast for 5 days., Disp: 10 tablet, Rfl: 0 .  Ascorbic Acid (VITAMIN C) 1000 MG tablet, Take 1,000 mg by mouth daily., Disp: , Rfl:  .  atorvastatin (LIPITOR) 80 MG tablet, TAKE (1) TABLET BY MOUTH AT BEDTIME., Disp: 90 tablet, Rfl: 1 .  azelastine (OPTIVAR) 0.05 % ophthalmic solution, Place 1 drop into both eyes as needed. , Disp: , Rfl:  .  benzonatate (TESSALON)  100 MG capsule, Take 1 capsule (100 mg total) by mouth every 8 (eight) hours., Disp: 21 capsule, Rfl: 0 .  clopidogrel (PLAVIX) 75 MG tablet, Take 1 tablet (75 mg total) by mouth daily., Disp: 90 tablet, Rfl: 3 .  Coenzyme Q10 50 MG CAPS, Take 1 capsule by mouth daily., Disp: , Rfl:  .  eplerenone (INSPRA) 25 MG tablet, TAKE (1) TABLET BY MOUTH ONCE DAILY., Disp: 90 tablet, Rfl: 0 .  fenofibrate 160 MG tablet, Take 160 mg by mouth daily., Disp: , Rfl:  .  fluticasone (FLONASE) 50 MCG/ACT nasal spray, Place 2 sprays into both nostrils daily., Disp: 16 g, Rfl: 6 .  folic acid (FOLVITE) 161 MCG tablet, Take 800 mcg by mouth daily. , Disp: , Rfl:  .  hydrochlorothiazide (MICROZIDE) 12.5 MG capsule, Take 1 capsule (12.5 mg total) by mouth as needed (weight gain 3lbs or more in 24 hours 5lbs in a week or swelling)., Disp: 30 capsule, Rfl: 1 .  Melatonin 5 MG TABS, Take 5 mg by mouth at bedtime. , Disp: , Rfl:  .  metoprolol succinate (TOPROL-XL) 50 MG 24 hr tablet, TAKE 1 TABLET BY MOUTH ONCE DAILY. TAKE WITH OR IMMEDIATELY FOLLOWING A MEAL., Disp: 90 tablet, Rfl: 3 .  nitroGLYCERIN (NITROSTAT) 0.4 MG SL tablet, Place 1 tablet (0.4 mg total) under the tongue as needed for chest pain., Disp: 25 tablet, Rfl: 3 .  Omega-3 Fatty Acids (FISH OIL) 1000 MG CAPS, Take 1 capsule by mouth daily., Disp: , Rfl:  .  omeprazole (PRILOSEC) 20 MG capsule, Take 20 mg by mouth in the morning and at bedtime., Disp: , Rfl:  .  ramipril (ALTACE) 10 MG capsule, TAKE (1) CAPSULE BY MOUTH ONCE DAILY., Disp: 90 capsule, Rfl: 0 .  sildenafil (VIAGRA) 50 MG tablet, Take 1 tablet (50 mg total) by mouth daily as needed for erectile dysfunction., Disp: 20 tablet, Rfl: 11 .  SOTALOL AF 80 MG TABS, Take 1 tablet (80 mg total) by mouth 2 (two) times daily., Disp: 60 tablet, Rfl: 6 .  triamcinolone cream (KENALOG) 0.1 %, Apply 1 application topically 2 (two) times daily as needed., Disp: 80 g, Rfl: 2 .  Vitamin D, Cholecalciferol, 25 MCG  (1000 UT) CAPS, Take by mouth. , Disp: , Rfl:  .  Vitamin E 400 UNITS TABS, Take 400 Units by mouth daily. , Disp: , Rfl:   Allergies  Allergen Reactions  . Amiodarone     Thyroid issues  . Carvedilol     Fatigue   . Escitalopram Oxalate     unknown  . Lasix [Furosemide]     Stomach upset and nausea  . Niacin And Related     Flushing   . Spironolactone     gynecomastia     ROS  As noted in HPI.   Physical Exam  BP Marland Kitchen)  145/92   Pulse 67   Temp 98.2 F (36.8 C)   Resp 18   SpO2 95%   Constitutional: Well developed, well nourished, no acute distress Eyes: PERRL, EOMI, conjunctiva normal bilaterally HENT: Normocephalic, atraumatic,mucus membranes moist.  Positive nasal congestion.  Erythematous, not swollen turbinates.  No maxillary, frontal sinus tenderness. Respiratory: Clear to auscultation bilaterally, no rales, no wheezing, no rhonchi Cardiovascular: Normal rate and rhythm, no murmurs, no gallops, no rubs GI: Nondistended skin: No rash, skin intact Musculoskeletal:  no deformities Neurologic: Alert & oriented x 3, CN III-XII grossly intact, no motor deficits, sensation grossly intact Psychiatric: Speech and behavior appropriate   ED Course   Medications - No data to display  Orders Placed This Encounter  Procedures  . Novel Coronavirus, NAA ([facility])    Standing Status:   Standing    Number of Occurrences:   1   No results found for this or any previous visit (from the past 24 hour(s)). No results found.  ED Clinical Impression  1. Viral upper respiratory tract infection   2. Encounter for laboratory testing for COVID-19 virus      ED Assessment/Plan   COVID sent.  May be candidate for antiviral/monoclonal therapy based on cardiac risk factors.  In the meantime, discontinue Zyrtec, start Mucinex D or Mucinex DM.  Continue Flonase.  Start saline nasal irrigation, will refill Tessalon.  Wait-and-see prescription of prednisone.  Discussed with him  that it is not clearly indicated for COVID or for URIs but we have agreed on a wait-and-see prescription, but he states that this helped him significantly last time when he came in for identical symptoms 2 months ago.  Discussed side effects and risks of prednisone therapy.  Discussed labs, MDM, treatment plan, and plan for follow-up with patient. Discussed sn/sx that should prompt return to the ED. patient agrees with plan.   Meds ordered this encounter  Medications  . benzonatate (TESSALON) 100 MG capsule    Sig: Take 1 capsule (100 mg total) by mouth every 8 (eight) hours.    Dispense:  21 capsule    Refill:  0  . predniSONE (DELTASONE) 20 MG tablet    Sig: Take 2 tablets (40 mg total) by mouth daily with breakfast for 5 days.    Dispense:  10 tablet    Refill:  0    *This clinic note was created using [employer]. Therefore, there may be occasional mistakes despite careful proofreading.  ?    Melynda Ripple, MD 11/20/20 [phone]

## 2020-11-20 ENCOUNTER — Telehealth: Payer: Self-pay | Admitting: Cardiology

## 2020-11-20 DIAGNOSIS — U071 COVID-19: Secondary | ICD-10-CM

## 2020-11-20 LAB — NOVEL CORONAVIRUS, NAA: SARS-CoV-2, NAA: DETECTED — AB

## 2020-11-20 LAB — SARS-COV-2, NAA 2 DAY TAT

## 2020-11-20 NOTE — Telephone Encounter (Signed)
Patient would like a transition of care from Dr. Percival Spanish to Dr. Harl Bowie. Please confirm message

## 2020-11-21 ENCOUNTER — Other Ambulatory Visit: Payer: Self-pay | Admitting: Physician Assistant

## 2020-11-21 ENCOUNTER — Telehealth (HOSPITAL_COMMUNITY): Payer: Self-pay | Admitting: Pharmacist

## 2020-11-21 MED ORDER — NIRMATRELVIR/RITONAVIR (PAXLOVID)TABLET: 3.0000 | ORAL_TABLET | Freq: Two times a day (BID) | ORAL | 0 refills | Status: AC

## 2020-11-21 MED FILL — PAXLOVID 20 X 150 MG & 10 X: 20 X 150 MG | 5 days supply | Qty: 30 | Fill #0

## 2020-11-21 NOTE — Progress Notes (Signed)
Outpatient Oral COVID Treatment Note  I connected with Paul [name] on 11/21/2020/12:20 PM by telephone and verified that I am speaking with the correct person using two identifiers.  I discussed the limitations, risks, security, and privacy concerns of performing an evaluation and management service by telephone and the availability of in person appointments. I also discussed with the patient that there may be a patient responsible charge related to this service. The patient expressed understanding and agreed to proceed.  Patient location: home  Provider location: office  Diagnosis: COVID-19 infection  Purpose of visit: Discussion of potential use of Molnupiravir or Paxlovid, a new treatment for mild to moderate COVID-19 viral infection in non-hospitalized patients.   Subjective: Patient is a 70 y.o. male who has been diagnosed with COVID 19 viral infection.  Their symptoms began on 1/27 with runny nose and cold type symptoms.    Past Medical History:  Diagnosis Date  . Basal cell carcinoma 07/17/2017   nod-L cheek (CX35FU)  . BPH (benign prostatic hyperplasia)   . CAD (coronary artery disease)    widely patent LAD LIMA graft, widely patent PDA PLR SVG, occluded Diag 1 OM 1 SVG by cardiac CT 2007   . CHF (congestive heart failure) ([place])    ECHO 03/2019: EF 30-35%  . Fatty liver   . GERD (gastroesophageal reflux disease)   . Hyperlipidemia   . IBS (irritable bowel syndrome)   . 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 Ischemic cardiomyopathy    Prior inferior infarct EF 35% by ECHO 07/13/11 ; ECHO 03/2019: EF 30-35%  . S/P CABG (coronary artery bypass graft)    CABG 06/1998 [place], [place] with LIMA to LAD, SVG to OM, SVG to PD-PL.   OM graft occluded 10/12/2005 by CTA   . SCC (squamous cell carcinoma) 11/11/2016   in situ- R anti helix (CX35FU), well diff-R upper crust of helix (CX35FU)  . Sleep apnea   . Ventricular tachycardia ([place]) 10/15   VT at 188 bpm-  unstable requiring external defibrillation    Allergies  Allergen Reactions  . Amiodarone     Thyroid issues  . Carvedilol     Fatigue   . Escitalopram Oxalate     unknown  . Lasix [Furosemide]     Stomach upset and nausea  . Niacin And Related     Flushing   . Spironolactone     gynecomastia     Current Outpatient Medications:  .  Ascorbic Acid (VITAMIN C) 1000 MG tablet, Take 1,000 mg by mouth daily., Disp: , Rfl:  .  atorvastatin (LIPITOR) 80 MG tablet, TAKE (1) TABLET BY MOUTH AT BEDTIME., Disp: 90 tablet, Rfl: 1 .  azelastine (OPTIVAR) 0.05 % ophthalmic solution, Place 1 drop into both eyes as needed. , Disp: , Rfl:  .  benzonatate (TESSALON) 100 MG capsule, Take 1 capsule (100 mg total) by mouth every 8 (eight) hours., Disp: 21 capsule, Rfl: 0 .  clopidogrel (PLAVIX) 75 MG tablet, Take 1 tablet (75 mg total) by mouth daily., Disp: 90 tablet, Rfl: 3 .  Coenzyme Q10 50 MG CAPS, Take 1 capsule by mouth daily., Disp: , Rfl:  .  eplerenone (INSPRA) 25 MG tablet, TAKE (1) TABLET BY MOUTH ONCE DAILY., Disp: 90 tablet, Rfl: 0 .  fenofibrate 160 MG tablet, Take 160 mg by mouth daily., Disp: , Rfl:  .  fluticasone (FLONASE) 50 MCG/ACT nasal spray, Place 2 sprays into both nostrils daily., Disp: 16 g, Rfl: 6 .  folic acid (FOLVITE) 759 MCG tablet, Take 800 mcg by mouth daily. , Disp: , Rfl:  .  hydrochlorothiazide (MICROZIDE) 12.5 MG capsule, Take 1 capsule (12.5 mg total) by mouth as needed (weight gain 3lbs or more in 24 hours 5lbs in a week or swelling)., Disp: 30 capsule, Rfl: 1 .  Melatonin 5 MG TABS, Take 5 mg by mouth at bedtime. , Disp: , Rfl:  .  metoprolol succinate (TOPROL-XL) 50 MG 24 hr tablet, TAKE 1 TABLET BY MOUTH ONCE DAILY. TAKE WITH OR IMMEDIATELY FOLLOWING A MEAL., Disp: 90 tablet, Rfl: 3 .  nitroGLYCERIN (NITROSTAT) 0.4 MG SL tablet, Place 1 tablet (0.4 mg total) under the tongue as needed for chest pain., Disp: 25 tablet, Rfl: 3 .  Omega-3 Fatty Acids (FISH OIL)  1000 MG CAPS, Take 1 capsule by mouth daily., Disp: , Rfl:  .  omeprazole (PRILOSEC) 20 MG capsule, Take 20 mg by mouth in the morning and at bedtime., Disp: , Rfl:  .  predniSONE (DELTASONE) 20 MG tablet, Take 2 tablets (40 mg total) by mouth daily with breakfast for 5 days., Disp: 10 tablet, Rfl: 0 .  ramipril (ALTACE) 10 MG capsule, TAKE (1) CAPSULE BY MOUTH ONCE DAILY., Disp: 90 capsule, Rfl: 0 .  sildenafil (VIAGRA) 50 MG tablet, Take 1 tablet (50 mg total) by mouth daily as needed for erectile dysfunction., Disp: 20 tablet, Rfl: 11 .  SOTALOL AF 80 MG TABS, Take 1 tablet (80 mg total) by mouth 2 (two) times daily., Disp: 60 tablet, Rfl: 6 .  triamcinolone cream (KENALOG) 0.1 %, Apply 1 application topically 2 (two) times daily as needed., Disp: 80 g, Rfl: 2 .  Vitamin D, Cholecalciferol, 25 MCG (1000 UT) CAPS, Take by mouth. , Disp: , Rfl:  .  Vitamin E 400 UNITS TABS, Take 400 Units by mouth daily. , Disp: , Rfl:   Objective: Patient sounds stable.  They are in no apparent distress.  Breathing is non labored.  Mood and behavior are normal.  Laboratory Data:  Recent Results (from the past 2160 hour(s))  Novel Coronavirus, NAA ([facility])     Status: None   Collection Time: 09/14/20 12:48 PM   Specimen: Nasopharyngeal(NP) swabs in vial transport medium   Nasopharynge  Result Value Ref Range   SARS-CoV-2, NAA Not Detected Not Detected    Comment: This nucleic acid amplification test was developed and its performance characteristics determined by [employer], [employer] and [employer]. Nucleic acid amplification tests include RT-PCR and TMA. This test has not been FDA cleared or approved. This test has been authorized by FDA under an Emergency Use Authorization (EUA). This test is only authorized for the duration of time the declaration that circumstances exist justifying the authorization of the emergency use of in vitro diagnostic tests for detection of SARS-CoV-2 virus and/or diagnosis of COVID-19  infection under section 564(b)(1) of the Act, 21 U.S.C. 163WGY-6(Z) (1), unless the authorization is terminated or revoked sooner. When diagnostic testing is negative, the possibility of a false negative result should be considered in the context of a patient's recent exposures and the presence of clinical signs and symptoms consistent with COVID-19. An individual without symptoms of COVID-19 and who is not shedding SARS-CoV-2 virus wo uld expect to have a negative (not detected) result in this assay.   SARS-COV-2, NAA 2 DAY TAT     Status: None   Collection Time: 09/14/20 12:48 PM   Nasopharynge  Result Value Ref Range   SARS-CoV-2, NAA 2 DAY TAT  Performed   CUP PACEART INCLINIC DEVICE CHECK     Status: None   Collection Time: 09/21/20 12:02 PM  Result Value Ref Range   Date Time Interrogation Session [id number]    Pulse Generator Manufacturer MERM    Pulse Gen Model [id number] Evera XT DR    Pulse Gen Serial Number [id number]    Clinic Name [facility]    Implantable Pulse Generator Type Implantable Cardiac Defibulator    Implantable Pulse Generator Implant Date [id number]    Implantable Lead Manufacturer MERM    Implantable Lead Model 5076 CapSureFix Novus    Implantable Lead Serial Number [id number]    Implantable Lead Implant Date 37290211    Implantable Lead Location Detail 1 APPENDAGE    Implantable Lead Location [id number]    Implantable Lead Manufacturer [facility]    Implantable Lead Model 6935 Sprint Quattro Secure S    Implantable Lead Serial Number [id number]    Implantable Lead Implant Date 15520802    Implantable Lead Location Detail 1 APEX    Implantable Lead Location [id number]    Lead Channel Setting Sensing Sensitivity 0.3 mV   Lead Channel Setting Pacing Amplitude 2.25 V   Lead Channel Setting Pacing Pulse Width 0.4 ms   Lead Channel Setting Pacing Amplitude 2.5 V   Lead Channel Impedance Value 475 ohm   Lead Channel Sensing Intrinsic Amplitude 1.9 mV   Lead  Channel Pacing Threshold Amplitude 1.0 V   Lead Channel Pacing Threshold Pulse Width 0.4 ms   Lead Channel Impedance Value 361 ohm   Lead Channel Impedance Value 285 ohm   Lead Channel Sensing Intrinsic Amplitude 8.1 mV   Lead Channel Pacing Threshold Amplitude 1 V   Lead Channel Pacing Threshold Pulse Width 0.4 ms   HighPow Impedance 67 ohm   Battery Status OK    Battery Remaining Longevity 38 mo   Battery Voltage 2.96 V   Brady Statistic RA Percent Paced 12.22 %   Brady Statistic RV Percent Paced 0.07 %   Brady Statistic AP VP Percent 0.02 %   Brady Statistic AS VP Percent 0.05 %   Brady Statistic AP VS Percent 12.72 %   Brady Statistic AS VS Percent 87.22 %   Eval Rhythm AS/VS 55   Basic Metabolic Panel (BMET)     Status: None   Collection Time: 09/21/20 12:18 PM  Result Value Ref Range   Glucose 88 65 - 99 mg/dL   BUN 15 8 - 27 mg/dL   Creatinine, Ser 1.20 0.76 - 1.27 mg/dL   GFR calc non Af Amer 61 >59 mL/min/1.73   GFR calc Af Amer 71 >59 mL/min/1.73    Comment: **In accordance with recommendations from the NKF-ASN Task force,**   [facility] is in the process of updating its eGFR calculation to the   2021 CKD-EPI creatinine equation that estimates kidney function   without a race variable.    BUN/Creatinine Ratio 13 10 - 24   Sodium 142 134 - 144 mmol/L   Potassium 4.1 3.5 - 5.2 mmol/L   Chloride 103 96 - 106 mmol/L   CO2 26 20 - 29 mmol/L   Calcium 9.0 8.6 - 10.2 mg/dL  Magnesium     Status: None   Collection Time: 09/21/20 12:18 PM  Result Value Ref Range   Magnesium 1.7 1.6 - 2.3 mg/dL  Novel Coronavirus, NAA ([facility])     Status: Abnormal   Collection Time: 11/19/20  2:41 PM   Specimen: Nasopharyngeal Swab; Nasopharyngeal(NP) swabs  in vial transport medium   Nasopharynge  Result Value Ref Range   SARS-CoV-2, NAA Detected (A) Not Detected    Comment: Patients who have a positive COVID-19 test result may now have treatment options. Treatment options are available  for patients with mild to moderate symptoms and for hospitalized patients. Visit our website at [email or web address] for resources and information. This nucleic acid amplification test was developed and its performance characteristics determined by [employer], [employer] and [employer]. Nucleic acid amplification tests include RT-PCR and TMA. This test has not been FDA cleared or approved. This test has been authorized by FDA under an Emergency Use Authorization (EUA). This test is only authorized for the duration of time the declaration that circumstances exist justifying the authorization of the emergency use of in vitro diagnostic tests for detection of SARS-CoV-2 virus and/or diagnosis of COVID-19 infection under section 564(b)(1) of the Act, 21 U.S.C. 284XLK-4(M) (1), unless the authorization is terminated or revoked sooner. When diagnostic testing is negativ e, the possibility of a false negative result should be considered in the context of a patient's recent exposures and the presence of clinical signs and symptoms consistent with COVID-19. An individual without symptoms of COVID-19 and who is not shedding SARS-CoV-2 virus would expect to have a negative (not detected) result in this assay.   SARS-COV-2, NAA 2 DAY TAT     Status: None   Collection Time: 11/19/20  2:41 PM   Nasopharynge  Result Value Ref Range   SARS-CoV-2, NAA 2 DAY TAT Performed      Assessment: 70 y.o. male with mild/moderate COVID 19 viral infection diagnosed on 1/27 at high risk for progression to severe COVID 19.  Plan:  This patient is a 70 y.o. male that meets the following criteria for Emergency Use Authorization of: Paxlovid 1. Age >12 yr AND > 40 kg 2. SARS-COV-2 positive test 3. Symptom onset < 5 days 4. Mild-to-moderate COVID disease with high risk for severe progression to hospitalization or death  I have spoken and communicated the following to the patient or parent/caregiver  regarding: 1. Paxlovid is an unapproved drug that is authorized for use under an Emergency Use Authorization.  2. There are no adequate, approved, available products for the treatment of COVID-19 in adults who have mild-to-moderate COVID-19 and are at high risk for progressing to severe COVID-19, including hospitalization or death. 3. Other therapeutics are currently authorized. For additional information on all products authorized for treatment or prevention of COVID-19, please see [email or web address].  4. There are benefits and risks of taking this treatment as outlined in the "Fact Sheet for Patients and Caregivers."  5. "Fact Sheet for Patients and Caregivers" was reviewed with patient. A hard copy will be provided to patient from pharmacy prior to the patient receiving treatment. 6. Patients should continue to self-isolate and use infection control measures (e.g., wear mask, isolate, social distance, avoid sharing personal items, clean and disinfect "high touch" surfaces, and frequent handwashing) according to CDC guidelines.  7. The patient or parent/caregiver has the option to accept or refuse treatment. 8. Patient medication history was reviewed for potential drug interactions:Interaction with home meds: flonase, viagra and eplerenone 9. Patient's creatinine clearance was calculated to be 73, and they were therefore prescribed Normal dose (CrCl>60) - nirmatrelvir 162m tab (2 tablet) by mouth twice daily AND ritonavir 1038mtab (1 tablet) by mouth twice daily   After reviewing above information with the patient, the patient agrees to receive Paxlovid.  Follow up instructions:    .  Take prescription BID x 5 days as directed . Reach out to pharmacist for counseling on medication if desired . For concerns regarding further COVID symptoms please follow up with your PCP or urgent care . For  urgent or life-threatening issues, seek care at your local emergency department  The patient was provided an opportunity to ask questions, and all were answered. The patient agreed with the plan and demonstrated an understanding of the instructions.   Script sent to [facility] and opted to pick up RX.  The patient was advised to call their PCP or seek an in-person evaluation if the symptoms worsen or if the condition fails to improve as anticipated.   I provided 20 minutes of non face-to-face telephone visit time during this encounter, and > 50% was spent counseling as documented under my assessment & plan.  Angelena Form, PA-C 11/21/2020 /12:20 PM

## 2020-11-21 NOTE — Telephone Encounter (Signed)
OK with me.  I have only seen him once.

## 2020-11-21 NOTE — Telephone Encounter (Signed)
Patient was prescribed oral covid treatment paxlovid and treatment note was reviewed. Medication has been received by [facility] and reviewed for appropriateness.  Drug Interactions or Dosage Adjustments Noted: - Patient advised to hold the following while taking paxlovid: atorvastatin, flonase, sildenifil, eplerenone  - Patient can restart these medications 24 hours after last paxlovid dose  -Crcl: 73 mL/min - no dose adjustments needed  Delivery Method: Pick-up  Patient contacted for counseling on 11/21/2020 and verbalized understanding.   Delivery or Pick-Up Date: 11/21/2020   Lowella Bandy 11/21/2020, 1:28 PM [facility] Outpatient Pharmacist Phone# [phone]

## 2020-11-22 NOTE — Telephone Encounter (Signed)
covid positive. Ordering referral to covid treatment team due to cardiac RF.  Melynda Ripple, MD

## 2020-11-23 NOTE — Telephone Encounter (Signed)
Ok with me  Zandra Abts MD

## 2020-12-15 ENCOUNTER — Ambulatory Visit (INDEPENDENT_AMBULATORY_CARE_PROVIDER_SITE_OTHER): Payer: PPO

## 2020-12-15 DIAGNOSIS — I5022 Chronic systolic (congestive) heart failure: Secondary | ICD-10-CM | POA: Diagnosis not present

## 2020-12-15 DIAGNOSIS — Z95810 Presence of automatic (implantable) cardiac defibrillator: Secondary | ICD-10-CM | POA: Diagnosis not present

## 2020-12-21 NOTE — Progress Notes (Signed)
EPIC Encounter for ICM Monitoring  Patient Name: Paul [name] is a 70 y.o. male Date: 12/21/2020 Primary Care Physican: [facility] Primary Cardiologist:Hochrein Electrophysiologist: Allred 1/18/2022Weight:192lbs   Spoke with patient and reports feeling well at this time. Denies fluid symptoms.  Optivol thoracic impedance normal.  Prescribed:Hydrochlorothiazide 12.5 mgTake 1 capsule (12.5 mg total) by mouth as needed (weight gain 3lbs or more in 24 hours 5lbs in a week or swelling).  Labs: 09/21/2020 Creatinine 1.20, BUN 15, Potassium 4.1, Sodium 142, GFR 61-71 02/26/2020 Creatinine 1.17, BUN 9, Potassium 5.0, Sodium 142, GFR 63-73  Recommendations: No changes and encouraged to call if experiencing any fluid symptoms.  Follow-up plan: ICM clinic phone appointment on3/28/2022. 91 day device clinic remote transmission3/16/2022.   EP/Cardiology Office Visits: Recall 02/22/2021 with Dr Percival Spanish. Recall 11/26/2022with Dr Rayann Heman.  Copy of ICM check sent to Dr.Allred.  3 month ICM trend: 12/15/2020.    1 Year ICM trend:       [name], RN 12/21/2020 3:15 PM

## 2021-01-06 ENCOUNTER — Ambulatory Visit (INDEPENDENT_AMBULATORY_CARE_PROVIDER_SITE_OTHER): Payer: Medicare Other

## 2021-01-06 DIAGNOSIS — I255 Ischemic cardiomyopathy: Secondary | ICD-10-CM | POA: Diagnosis not present

## 2021-01-07 LAB — CUP PACEART REMOTE DEVICE CHECK
Battery Remaining Longevity: 37 mo
Battery Voltage: 2.96 V
Brady Statistic AP VP Percent: 0.01 %
Brady Statistic AP VS Percent: 21.91 %
Brady Statistic AS VP Percent: 0.05 %
Brady Statistic AS VS Percent: 78.03 %
Brady Statistic RA Percent Paced: 20.09 %
Brady Statistic RV Percent Paced: 0.06 %
Date Time Interrogation Session: 20220316022703
HighPow Impedance: 74 Ohm
Implantable Lead Implant Date: 20151008
Implantable Lead Implant Date: 20151008
Implantable Lead Location: 753859
Implantable Lead Location: 753860
Implantable Lead Model: 5076
Implantable Lead Model: 6935
Implantable Pulse Generator Implant Date: 20151008
Lead Channel Impedance Value: 304 Ohm
Lead Channel Impedance Value: 399 Ohm
Lead Channel Impedance Value: 475 Ohm
Lead Channel Pacing Threshold Amplitude: 1.125 V
Lead Channel Pacing Threshold Amplitude: 1.125 V
Lead Channel Pacing Threshold Pulse Width: 0.4 ms
Lead Channel Pacing Threshold Pulse Width: 0.4 ms
Lead Channel Sensing Intrinsic Amplitude: 1.5 mV
Lead Channel Sensing Intrinsic Amplitude: 1.5 mV
Lead Channel Sensing Intrinsic Amplitude: 5.875 mV
Lead Channel Sensing Intrinsic Amplitude: 5.875 mV
Lead Channel Setting Pacing Amplitude: 2.5 V
Lead Channel Setting Pacing Amplitude: 2.5 V
Lead Channel Setting Pacing Pulse Width: 0.4 ms
Lead Channel Setting Sensing Sensitivity: 0.3 mV

## 2021-01-14 NOTE — Progress Notes (Signed)
Remote ICD transmission.   

## 2021-01-19 ENCOUNTER — Telehealth: Payer: Self-pay | Admitting: *Deleted

## 2021-01-19 ENCOUNTER — Encounter: Payer: Self-pay | Admitting: Cardiology

## 2021-01-19 ENCOUNTER — Ambulatory Visit: Payer: Medicare Other | Admitting: Cardiology

## 2021-01-19 VITALS — BP 146/80 | HR 56 | Ht 68.0 in | Wt 198.0 lb

## 2021-01-19 DIAGNOSIS — I5022 Chronic systolic (congestive) heart failure: Secondary | ICD-10-CM

## 2021-01-19 DIAGNOSIS — I2510 Atherosclerotic heart disease of native coronary artery without angina pectoris: Secondary | ICD-10-CM

## 2021-01-19 DIAGNOSIS — I472 Ventricular tachycardia, unspecified: Secondary | ICD-10-CM

## 2021-01-19 DIAGNOSIS — I10 Essential (primary) hypertension: Secondary | ICD-10-CM

## 2021-01-19 DIAGNOSIS — E782 Mixed hyperlipidemia: Secondary | ICD-10-CM

## 2021-01-19 MED ORDER — ENTRESTO 49-51 MG PO TABS: 1.0000 | ORAL_TABLET | Freq: Two times a day (BID) | ORAL | 6 refills | Status: DC

## 2021-01-19 MED ORDER — HYDROCHLOROTHIAZIDE 12.5 MG PO CAPS: 12.5000 mg | ORAL_CAPSULE | Freq: Every day | ORAL | 6 refills | Status: DC | PRN

## 2021-01-19 NOTE — Telephone Encounter (Signed)
Entresto 49/51 mg BID will cost $37 per month-Patient informed before he leaving office and he says he would think about it and speak to his pharmacist first, then call us if he decides to make the switch to entresto. MD aware.

## 2021-01-19 NOTE — Patient Instructions (Addendum)
Medication Instructions:   Your physician recommends that you continue on your current medications as directed. Please refer to the Current Medication list given to you today.  We are checking on the cost of entresto 49/51 mg at your pharmacy. Do not pick up this prescription  Entresto $37/month  Labwork:  none  Testing/Procedures:  none  Follow-Up:  Your physician recommends that you schedule a follow-up appointment in: 6 months.  Any Other Special Instructions Will Be Listed Below (If Applicable).  If you need a refill on your cardiac medications before your next appointment, please call your pharmacy.

## 2021-01-19 NOTE — Progress Notes (Signed)
Clinical Summary Paul [name] is a 70 y.o.male seen today for follow up of the following medical problems.   1. CAD/ICM/Chronic systolic HF - history of prior CABG in 1999 as reported below - from notes has not been able to afford entresto and also intolerant to lasix in the past, has used HCTZ as diuretic. Previous fatigue on coreg, has tolerated toprol. Gynecomastia on aldactone.  - 03/2019 echo LVEF 30-35%, grade II dd -07/2014 cath as reported below - fluid well controlled with prn HCTZ  - ICD followed by Dr Rayann Heman. Normal device check 12/2020  - no recent SOB/DOE. No recent chest pain - compliant with meds   2. History of VT - followed by Dr Rayann Heman - has been on sotalol, has ICD in place - amio allergy  - no recent palpitations   3. COVID + -diagnosed Jan 2022 - completed course of paxlovid   4. Hyperlipidemia - upcoming labs with pcp 07/2020 TC 155 TG 97 HDL 63 LDL 74   5.HTN - has not taken metoprolol yet today - home bp's SBPs 120s-130s usually   SH: he is [employer] fan, excited about the upcoming final four   Past Medical History:  Diagnosis Date  . Basal cell carcinoma 07/17/2017   nod-L cheek (CX35FU)  . BPH (benign prostatic hyperplasia)   . CAD (coronary artery disease)    widely patent LAD LIMA graft, widely patent PDA PLR SVG, occluded Diag 1 OM 1 SVG by cardiac CT 2007   . CHF (congestive heart failure) ([place])    ECHO 03/2019: EF 30-35%  . Fatty liver   . GERD (gastroesophageal reflux disease)   . Hyperlipidemia   . IBS (irritable bowel syndrome)   . 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 Ischemic cardiomyopathy    Prior inferior infarct EF 35% by ECHO 07/13/11 ; ECHO 03/2019: EF 30-35%  . S/P CABG (coronary artery bypass graft)    CABG 06/1998 [place], [place] with LIMA to LAD, SVG to OM, SVG to PD-PL.   OM graft occluded 10/12/2005 by CTA   . SCC (squamous cell carcinoma) 11/11/2016   in situ- R anti helix (CX35FU), well  diff-R upper crust of helix (CX35FU)  . Sleep apnea   . Ventricular tachycardia ([place]) 10/15   VT at 188 bpm- unstable requiring external defibrillation     Allergies  Allergen Reactions  . Amiodarone     Thyroid issues  . Carvedilol     Fatigue   . Escitalopram Oxalate     unknown  . Lasix [Furosemide]     Stomach upset and nausea  . Niacin And Related     Flushing   . Spironolactone     gynecomastia     Current Outpatient Medications  Medication Sig Dispense Refill  . Ascorbic Acid (VITAMIN C) 1000 MG tablet Take 1,000 mg by mouth daily.    Marland Kitchen atorvastatin (LIPITOR) 80 MG tablet TAKE (1) TABLET BY MOUTH AT BEDTIME. 90 tablet 1  . azelastine (OPTIVAR) 0.05 % ophthalmic solution Place 1 drop into both eyes as needed.     . benzonatate (TESSALON) 100 MG capsule Take 1 capsule (100 mg total) by mouth every 8 (eight) hours. 21 capsule 0  . clopidogrel (PLAVIX) 75 MG tablet Take 1 tablet (75 mg total) by mouth daily. 90 tablet 3  . Coenzyme Q10 50 MG CAPS Take 1 capsule by mouth daily.    Marland Kitchen eplerenone (INSPRA) 25 MG tablet TAKE (1) TABLET BY  MOUTH ONCE DAILY. 90 tablet 0  . fenofibrate 160 MG tablet Take 160 mg by mouth daily.    . fluticasone (FLONASE) 50 MCG/ACT nasal spray Place 2 sprays into both nostrils daily. 16 g 6  . folic acid (FOLVITE) 956 MCG tablet Take 800 mcg by mouth daily.     . hydrochlorothiazide (MICROZIDE) 12.5 MG capsule Take 1 capsule (12.5 mg total) by mouth as needed (weight gain 3lbs or more in 24 hours 5lbs in a week or swelling). 30 capsule 1  . Melatonin 5 MG TABS Take 5 mg by mouth at bedtime.     . metoprolol succinate (TOPROL-XL) 50 MG 24 hr tablet TAKE 1 TABLET BY MOUTH ONCE DAILY. TAKE WITH OR IMMEDIATELY FOLLOWING A MEAL. 90 tablet 3  . nitroGLYCERIN (NITROSTAT) 0.4 MG SL tablet Place 1 tablet (0.4 mg total) under the tongue as needed for chest pain. 25 tablet 3  . Omega-3 Fatty Acids (FISH OIL) 1000 MG CAPS Take 1 capsule by mouth daily.    Marland Kitchen  omeprazole (PRILOSEC) 20 MG capsule Take 20 mg by mouth in the morning and at bedtime.    . ramipril (ALTACE) 10 MG capsule TAKE (1) CAPSULE BY MOUTH ONCE DAILY. 90 capsule 0  . sildenafil (VIAGRA) 50 MG tablet Take 1 tablet (50 mg total) by mouth daily as needed for erectile dysfunction. 20 tablet 11  . SOTALOL AF 80 MG TABS Take 1 tablet (80 mg total) by mouth 2 (two) times daily. 60 tablet 6  . triamcinolone cream (KENALOG) 0.1 % Apply 1 application topically 2 (two) times daily as needed. 80 g 2  . Vitamin D, Cholecalciferol, 25 MCG (1000 UT) CAPS Take by mouth.     . Vitamin E 400 UNITS TABS Take 400 Units by mouth daily.      No current facility-administered medications for this visit.     Past Surgical History:  Procedure Laterality Date  . APPENDECTOMY    . COLONOSCOPY  06/11/2010   [place]; mild nonspecific erythematous mucosa in the descending colon s/p biopsied, diverticulosis, internal hemorrhoids.  Pathology with focal active colitis, nonspecific, main consideration includes acute infectious colitis.  NSAID induced colitis and/or Crohn's disease also in the differential but less likely.  . CORONARY ARTERY BYPASS GRAFT  1999  . IMPLANTABLE CARDIOVERTER DEFIBRILLATOR IMPLANT N/A 07/31/2014   MDT Gwyneth Revels XT DR ICD implanted by Dr Rayann Heman for secondary treatent of VT  . LEFT HEART CATHETERIZATION WITH CORONARY/GRAFT ANGIOGRAM N/A 07/30/2014   Procedure: LEFT HEART CATHETERIZATION WITH Beatrix Fetters;  Surgeon: Jacolyn Reedy, MD;  Location: [facility] CATH LAB;  Service: Cardiovascular;  Laterality: N/A;     Allergies  Allergen Reactions  . Amiodarone     Thyroid issues  . Carvedilol     Fatigue   . Escitalopram Oxalate     unknown  . Lasix [Furosemide]     Stomach upset and nausea  . Niacin And Related     Flushing   . Spironolactone     gynecomastia      Family History  Problem Relation Age of Onset  . Dementia Mother   . Cerebral aneurysm Father   .  Pneumonia Brother   . Stroke Maternal Aunt   . Diabetes Neg Hx   . Heart attack Neg Hx   . Hypertension Neg Hx   . Colon cancer Neg Hx      Social History Paul [name] reports that he has never smoked. He has never used smokeless  tobacco. Paul [name] reports current alcohol use of about 2.0 standard drinks of alcohol per week.   Review of Systems CONSTITUTIONAL: No weight loss, fever, chills, weakness or fatigue.  HEENT: Eyes: No visual loss, blurred vision, double vision or yellow sclerae.No hearing loss, sneezing, congestion, runny nose or sore throat.  SKIN: No rash or itching.  CARDIOVASCULAR: per hpi RESPIRATORY: No shortness of breath, cough or sputum.  GASTROINTESTINAL: No anorexia, nausea, vomiting or diarrhea. No abdominal pain or blood.  GENITOURINARY: No burning on urination, no polyuria NEUROLOGICAL: No headache, dizziness, syncope, paralysis, ataxia, numbness or tingling in the extremities. No change in bowel or bladder control.  MUSCULOSKELETAL: No muscle, back pain, joint pain or stiffness.  LYMPHATICS: No enlarged nodes. No history of splenectomy.  PSYCHIATRIC: No history of depression or anxiety.  ENDOCRINOLOGIC: No reports of sweating, cold or heat intolerance. No polyuria or polydipsia.  Marland Kitchen   Physical Examination Today's Vitals   01/19/21 1024  BP: (!) 146/80  Pulse: (!) 56  SpO2: 98%  Weight: 198 lb (89.8 kg)  Height: 5\' 8"  (1.727 m)   Body mass index is 30.11 kg/m.  Gen: resting comfortably, no acute distress HEENT: no scleral icterus, pupils equal round and reactive, no palptable cervical adenopathy,  CV: RRR, no m/r/g, no jvd Resp: Clear to auscultation bilaterally GI: abdomen is soft, non-tender, non-distended, normal bowel sounds, no hepatosplenomegaly MSK: extremities are warm, no edema.  Skin: warm, no rash Neuro:  no focal deficits Psych: appropriate affect   Diagnostic Studies 03/2019 echo IMPRESSIONS    1. Stage 1: 1: Basal  inferolateral segment, mid anterolateral segment,  mid inferolateral segment, and basal inferior segment are abnormal.  2. The left ventricle has moderate-severely reduced systolic function,  with an ejection fraction of 30-35%. The cavity size was severely dilated.  Left ventricular diastolic Doppler parameters are consistent with  pseudonormalization.  3. Left atrial size was mild-moderately dilated.  4. The mitral valve is grossly normal.  5. The tricuspid valve is grossly normal.  6. The aortic valve is tricuspid. Mild thickening of the aortic valve.  Mild calcification of the aortic valve.  7. There is mild dilatation of the aortic root measuring 37 mm.   07/2014 cath IMPRESSIONS:  1. Severe native two-vessel coronary artery disease with occlusion of the mid LAD, severe stenosis prior to a moderate-sized diagonal Makaylin Carlo and moderate disease prior to the circumflex marginal Omir Cooprider of the circumflex. Occluded right coronary artery distally with a severe proximal stenosis. 2.  Patent saphenous vein graft to place the right coronary artery and patent mammary graft to LAD, previously occluded vein graft to the marginal system 3. Potential sites of ischemia are the distal posterolateral Coni Homesley of the right coronary artery and moderate size diagonal Chrisa Hassan   Assessment and Plan  1. CAD/ICM/Chronic systolic HF - no significant symptoms - we will check with his pharmacy about possible cost of entresto  2. History of VT - followed by EP, on sotalol and has ICD - no recent issues, continue current meds  3. HTN - elevated here but has not had all his meds, home SBPs at goal -continue current meds  4. Hyperlipidemia - LDL was reasonable, upcoming labs with pcp. COntinue current meds   F/u 69months We heard back from his pharmacy, would be 37$ a month for entresto. He will think about it and let us know.  Arnoldo Lenis, M.D.

## 2021-01-22 NOTE — Progress Notes (Signed)
No ICM remote transmission received for 01/18/2021 and next ICM transmission scheduled for 01/27/2021.   

## 2021-01-27 ENCOUNTER — Ambulatory Visit (INDEPENDENT_AMBULATORY_CARE_PROVIDER_SITE_OTHER): Payer: Medicare Other

## 2021-01-27 DIAGNOSIS — Z95810 Presence of automatic (implantable) cardiac defibrillator: Secondary | ICD-10-CM | POA: Diagnosis not present

## 2021-01-27 DIAGNOSIS — I5022 Chronic systolic (congestive) heart failure: Secondary | ICD-10-CM | POA: Diagnosis not present

## 2021-01-27 NOTE — Progress Notes (Signed)
EPIC Encounter for ICM Monitoring  Patient Name: Paul [name] is a 70 y.o. male Date: 01/27/2021 Primary Care Physican: Chesley Noon, MD Primary Cardiologist:Hochrein Electrophysiologist: Allred 4/6/2022Weight:192lbs   Spoke with patient and reports feeling well at this time. Denies fluid symptoms.  Optivol thoracic impedancenormal.  Prescribed:Hydrochlorothiazide 12.5 mgTake 1 capsule (12.5 mg total) by mouth as needed (weight gain 3lbs or more in 24 hours 5lbs in a week or swelling).  Labs: 09/21/2020 Creatinine 1.20, BUN 15, Potassium 4.1, Sodium 142, GFR 61-71 02/26/2020 Creatinine 1.17, BUN 9, Potassium 5.0, Sodium 142, GFR 63-73  Recommendations: No changes and encouraged to call if experiencing any fluid symptoms.  Follow-up plan: ICM clinic phone appointment on5/06/2021. 91 day device clinic remote transmission6/15/2022.   EP/Cardiology Office Visits: 07/22/2021 with Dr Harl Bowie. Recall 11/26/2022with Dr Rayann Heman.  Copy of ICM check sent to Dr.Allred.  3 month ICM trend: 01/27/2021.    1 Year ICM trend:       [name], RN 01/27/2021 2:48 PM

## 2021-02-03 ENCOUNTER — Ambulatory Visit: Payer: PPO | Admitting: Internal Medicine

## 2021-02-26 ENCOUNTER — Other Ambulatory Visit: Payer: Self-pay | Admitting: *Deleted

## 2021-02-26 MED ORDER — ATORVASTATIN CALCIUM 80 MG PO TABS: ORAL_TABLET | ORAL | 1 refills | Status: AC

## 2021-03-01 ENCOUNTER — Ambulatory Visit (INDEPENDENT_AMBULATORY_CARE_PROVIDER_SITE_OTHER): Payer: Medicare Other

## 2021-03-01 DIAGNOSIS — I5022 Chronic systolic (congestive) heart failure: Secondary | ICD-10-CM

## 2021-03-01 DIAGNOSIS — Z95810 Presence of automatic (implantable) cardiac defibrillator: Secondary | ICD-10-CM

## 2021-03-02 NOTE — Progress Notes (Signed)
EPIC Encounter for ICM Monitoring  Patient Name: Paul [name] is a 70 y.o. male Date: 03/02/2021 Primary Care Physican: Chesley Noon, MD Primary Cardiologist:Hochrein Electrophysiologist: Allred 5/10/2022Weight:192lbs   Spoke with patient and reports feeling well at this time. Denies fluid symptoms.  Optivol thoracic impedancenormal.  Prescribed:Hydrochlorothiazide 12.5 mgTake 1 capsule (12.5 mg total) by mouth as needed (weight gain 3lbs or more in 24 hours 5lbs in a week or swelling).  Labs: 09/21/2020 Creatinine 1.20, BUN 15, Potassium 4.1, Sodium 142, GFR 61-71 02/26/2020 Creatinine 1.17, BUN 9, Potassium 5.0, Sodium 142, GFR 63-73  Recommendations:No changes and encouraged to call if experiencing any fluid symptoms.  Follow-up plan: ICM clinic phone appointment on6/17/2022. 91 day device clinic remote transmission6/15/2022.   EP/Cardiology Office Visits: 07/22/2021 with Dr Harl Bowie. Recall 11/26/2022with Dr Rayann Heman.  Copy of ICM check sent to Dr.Allred.  3 month ICM trend: 03/01/2021.    1 Year ICM trend:       [name], RN 03/02/2021 10:14 AM

## 2021-03-12 ENCOUNTER — Other Ambulatory Visit: Payer: Self-pay | Admitting: Internal Medicine

## 2021-03-12 ENCOUNTER — Ambulatory Visit: Payer: PPO | Admitting: Cardiology

## 2021-03-16 ENCOUNTER — Other Ambulatory Visit: Payer: Self-pay

## 2021-03-16 ENCOUNTER — Ambulatory Visit (INDEPENDENT_AMBULATORY_CARE_PROVIDER_SITE_OTHER): Payer: Medicare Other | Admitting: Nurse Practitioner

## 2021-03-16 ENCOUNTER — Encounter: Payer: Self-pay | Admitting: Nurse Practitioner

## 2021-03-16 VITALS — BP 114/72 | HR 57 | Ht 68.0 in | Wt 198.4 lb

## 2021-03-16 DIAGNOSIS — I472 Ventricular tachycardia, unspecified: Secondary | ICD-10-CM

## 2021-03-16 DIAGNOSIS — I5022 Chronic systolic (congestive) heart failure: Secondary | ICD-10-CM | POA: Diagnosis not present

## 2021-03-16 NOTE — Progress Notes (Signed)
Electrophysiology Office Note Date: 03/16/2021  ID:  Paul [name], Paul [name] [birth year], MRN [id number]  PCP: Chesley Noon, MD Primary Cardiologist: Branch Electrophysiologist: Allred  CC: Routine ICD follow-up  Paul [name] is a 70 y.o. male seen today for Dr Rayann Heman.  He presents today for routine electrophysiology followup.  Since last being seen in our clinic, the patient reports doing very well. He denies chest pain, palpitations, dyspnea, PND, orthopnea, nausea, vomiting, dizziness, syncope, edema, weight gain, or early satiety.  He has not had ICD shocks.    Past Medical History:  Diagnosis Date  . Basal cell carcinoma 07/17/2017   nod-L cheek (CX35FU)  . BPH (benign prostatic hyperplasia)   . CAD (coronary artery disease)    widely patent LAD LIMA graft, widely patent PDA PLR SVG, occluded Diag 1 OM 1 SVG by cardiac CT 2007   . CHF (congestive heart failure) ([place])    ECHO 03/2019: EF 30-35%  . Fatty liver   . GERD (gastroesophageal reflux disease)   . Hyperlipidemia   . IBS (irritable bowel syndrome)   . 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 Ischemic cardiomyopathy    Prior inferior infarct EF 35% by ECHO 07/13/11 ; ECHO 03/2019: EF 30-35%  . S/P CABG (coronary artery bypass graft)    CABG 06/1998 [place], [place] with LIMA to LAD, SVG to OM, SVG to PD-PL.   OM graft occluded 10/12/2005 by CTA   . SCC (squamous cell carcinoma) 11/11/2016   in situ- R anti helix (CX35FU), well diff-R upper crust of helix (CX35FU)  . Sleep apnea   . Ventricular tachycardia ([place]) 10/15   VT at 188 bpm- unstable requiring external defibrillation   Past Surgical History:  Procedure Laterality Date  . APPENDECTOMY    . COLONOSCOPY  06/11/2010   [place]; mild nonspecific erythematous mucosa in the descending colon s/p biopsied, diverticulosis, internal hemorrhoids.  Pathology with focal active colitis, nonspecific, main consideration includes acute  infectious colitis.  NSAID induced colitis and/or Crohn's disease also in the differential but less likely.  . CORONARY ARTERY BYPASS GRAFT  1999  . IMPLANTABLE CARDIOVERTER DEFIBRILLATOR IMPLANT N/A 07/31/2014   MDT Gwyneth Revels XT DR ICD implanted by Dr Rayann Heman for secondary treatent of VT  . LEFT HEART CATHETERIZATION WITH CORONARY/GRAFT ANGIOGRAM N/A 07/30/2014   Procedure: LEFT HEART CATHETERIZATION WITH Beatrix Fetters;  Surgeon: Jacolyn Reedy, MD;  Location: [facility] CATH LAB;  Service: Cardiovascular;  Laterality: N/A;    Current Outpatient Medications  Medication Sig Dispense Refill  . Ascorbic Acid (VITAMIN C) 1000 MG tablet Take 1,000 mg by mouth daily.    Marland Kitchen atorvastatin (LIPITOR) 80 MG tablet TAKE (1) TABLET BY MOUTH AT BEDTIME. 90 tablet 1  . azelastine (OPTIVAR) 0.05 % ophthalmic solution Place 1 drop into both eyes as needed.     . cetirizine (ZYRTEC) 10 MG tablet Take 10 mg by mouth daily.    . clopidogrel (PLAVIX) 75 MG tablet Take 1 tablet (75 mg total) by mouth daily. 90 tablet 3  . Coenzyme Q10 50 MG CAPS Take 1 capsule by mouth daily.    Marland Kitchen eplerenone (INSPRA) 25 MG tablet TAKE (1) TABLET BY MOUTH ONCE DAILY. 90 tablet 0  . fenofibrate 160 MG tablet Take 160 mg by mouth daily.    . fluticasone (FLONASE) 50 MCG/ACT nasal spray Place 2 sprays into both nostrils daily. 16 g 6  . folic acid (FOLVITE) 779 MCG tablet Take 800 mcg by mouth  daily.     . hydrochlorothiazide (MICROZIDE) 12.5 MG capsule Take 1 capsule (12.5 mg total) by mouth daily as needed (weight gain 3lbs or more in 24 hours 5lbs in a week or swelling). 30 capsule 6  . Melatonin 5 MG TABS Take 5 mg by mouth at bedtime.     . metoprolol succinate (TOPROL-XL) 50 MG 24 hr tablet TAKE 1 TABLET BY MOUTH ONCE DAILY. TAKE WITH OR IMMEDIATELY FOLLOWING A MEAL. 90 tablet 3  . nitroGLYCERIN (NITROSTAT) 0.4 MG SL tablet Place 1 tablet (0.4 mg total) under the tongue as needed for chest pain. 25 tablet 3  . Omega-3 Fatty Acids  (FISH OIL) 1000 MG CAPS Take 1 capsule by mouth daily.    Marland Kitchen omeprazole (PRILOSEC) 20 MG capsule Take 20 mg by mouth in the morning and at bedtime.    . ramipril (ALTACE) 10 MG capsule TAKE (1) CAPSULE BY MOUTH ONCE DAILY. 90 capsule 0  . sildenafil (VIAGRA) 50 MG tablet Take 1 tablet (50 mg total) by mouth daily as needed for erectile dysfunction. 20 tablet 11  . SOTALOL AF 80 MG TABS Take 1 tablet (80 mg total) by mouth 2 (two) times daily. 60 tablet 6  . triamcinolone cream (KENALOG) 0.1 % Apply 1 application topically 2 (two) times daily as needed. 80 g 2  . Vitamin D, Cholecalciferol, 25 MCG (1000 UT) CAPS Take by mouth.     . Vitamin E 400 UNITS TABS Take 400 Units by mouth daily.      No current facility-administered medications for this visit.    Allergies:   Amiodarone, Carvedilol, Escitalopram oxalate, Lasix [furosemide], Niacin and related, and Spironolactone   Social History: Social History   Socioeconomic History  . Marital status: Married    Spouse name: Neoma Laming  . Number of children: 2  . Years of education: Not on file  . Highest education level: Not on file  Occupational History  . Occupation: retired  Tobacco Use  . Smoking status: Never Smoker  . Smokeless tobacco: Never Used  Vaping Use  . Vaping Use: Never used  Substance and Sexual Activity  . Alcohol use: Yes    Alcohol/week: 2.0 standard drinks    Types: 2 Glasses of wine per week    Comment: Couple beers a day and wine with dinner.   . Drug use: No  . Sexual activity: Not on file  Other Topics Concern  . Not on file  Social History Narrative  . Not on file   Social Determinants of Health   Financial Resource Strain: Not on file  Food Insecurity: Not on file  Transportation Needs: Not on file  Physical Activity: Not on file  Stress: Not on file  Social Connections: Not on file  Intimate Partner Violence: Not on file    Family History: Family History  Problem Relation Age of Onset  .  Dementia Mother   . Cerebral aneurysm Father   . Pneumonia Brother   . Stroke Maternal Aunt   . Diabetes Neg Hx   . Heart attack Neg Hx   . Hypertension Neg Hx   . Colon cancer Neg Hx     Review of Systems: All other systems reviewed and are otherwise negative except as noted above.   Physical Exam: VS:  BP 114/72   Pulse (!) 57   Ht 5\' 8"  (1.727 m)   Wt 198 lb 6.4 oz (90 kg)   SpO2 96%   BMI 30.17 kg/m  ,  BMI Body mass index is 30.17 kg/m.  GEN- The patient is well appearing, alert and oriented x 3 today.   HEENT: normocephalic, atraumatic; sclera clear, conjunctiva pink; hearing intact; oropharynx clear; neck supple  Lungs- Clear to ausculation bilaterally, normal work of breathing.  No wheezes, rales, rhonchi Heart- Regular rate and rhythm  GI- soft, non-tender, non-distended, bowel sounds present  Extremities- no clubbing, cyanosis, or edema  MS- no significant deformity or atrophy Skin- warm and dry, no rash or lesion; ICD pocket well healed Psych- euthymic mood, full affect Neuro- strength and sensation are intact  ICD interrogation- reviewed in detail today,  See PACEART report  EKG:  EKG is ordered today. EKG today demonstrates sinus brady, rate 57, QTc 433msec  Recent Labs: 09/21/2020: BUN 15; Creatinine, Ser 1.20; Magnesium 1.7; Potassium 4.1; Sodium 142   Wt Readings from Last 3 Encounters:  03/16/21 198 lb 6.4 oz (90 kg)  01/19/21 198 lb (89.8 kg)  11/02/20 195 lb 1.9 oz (88.5 kg)     Other studies Reviewed: Additional studies/ records that were reviewed today include: Dr Rayann Heman and Dr Nelly Laurence office notes   Assessment and Plan:  1.  Chronic systolic dysfunction euvolemic today Stable on an appropriate medical regimen Normal ICD function See Pace Art report No changes today  2.  VT Well controlled with Sotalol EKG stable Labs with Dr Melford Aase next month   Current medicines are reviewed at length with the patient today.   The patient does  not have concerns regarding his medicines.  The following changes were made today:  none  Labs/ tests ordered today include:  No orders of the defined types were placed in this encounter.    Disposition:   Follow up with [facility], Dr Rayann Heman 1 year    Signed, [name], NP 03/16/2021 12:16 PM  [place] [address] [place] [id number] [phone] (office) [phone] (fax)

## 2021-03-16 NOTE — Patient Instructions (Signed)
Medication Instructions:  Your physician recommends that you continue on your current medications as directed. Please refer to the Current Medication list given to you today.  Labwork: None ordered.  Testing/Procedures: None ordered.  Follow-Up: Your physician recommends that you schedule a follow-up appointment in:   Dr Rayann Heman on Monday November 28 @ 11:45am  Any Other Special Instructions Will Be Listed Below (If Applicable).     If you need a refill on your cardiac medications before your next appointment, please call your pharmacy.

## 2021-04-01 ENCOUNTER — Telehealth: Payer: Self-pay

## 2021-04-01 MED ORDER — EPLERENONE 25 MG PO TABS: ORAL_TABLET | ORAL | 1 refills | Status: DC

## 2021-04-01 NOTE — Telephone Encounter (Signed)
Medication refill request for Eplerenone 25 mg tablets approved and sent to [facility] in [place] per pt request.

## 2021-04-07 ENCOUNTER — Ambulatory Visit (INDEPENDENT_AMBULATORY_CARE_PROVIDER_SITE_OTHER): Payer: Medicare Other

## 2021-04-07 DIAGNOSIS — I255 Ischemic cardiomyopathy: Secondary | ICD-10-CM | POA: Diagnosis not present

## 2021-04-08 ENCOUNTER — Ambulatory Visit (INDEPENDENT_AMBULATORY_CARE_PROVIDER_SITE_OTHER): Payer: Medicare Other

## 2021-04-08 DIAGNOSIS — I5022 Chronic systolic (congestive) heart failure: Secondary | ICD-10-CM | POA: Diagnosis not present

## 2021-04-08 DIAGNOSIS — Z95810 Presence of automatic (implantable) cardiac defibrillator: Secondary | ICD-10-CM | POA: Diagnosis not present

## 2021-04-09 ENCOUNTER — Other Ambulatory Visit: Payer: Self-pay | Admitting: *Deleted

## 2021-04-09 LAB — CUP PACEART REMOTE DEVICE CHECK
Battery Remaining Longevity: 34 mo
Battery Voltage: 2.96 V
Brady Statistic AP VP Percent: 0.01 %
Brady Statistic AP VS Percent: 21.81 %
Brady Statistic AS VP Percent: 0.04 %
Brady Statistic AS VS Percent: 78.13 %
Brady Statistic RA Percent Paced: 20.2 %
Brady Statistic RV Percent Paced: 0.05 %
Date Time Interrogation Session: 20220615012405
HighPow Impedance: 74 Ohm
Implantable Lead Implant Date: 20151008
Implantable Lead Implant Date: 20151008
Implantable Lead Location: 753859
Implantable Lead Location: 753860
Implantable Lead Model: 5076
Implantable Lead Model: 6935
Implantable Pulse Generator Implant Date: 20151008
Lead Channel Impedance Value: 304 Ohm
Lead Channel Impedance Value: 361 Ohm
Lead Channel Impedance Value: 456 Ohm
Lead Channel Pacing Threshold Amplitude: 1.125 V
Lead Channel Pacing Threshold Amplitude: 1.125 V
Lead Channel Pacing Threshold Pulse Width: 0.4 ms
Lead Channel Pacing Threshold Pulse Width: 0.4 ms
Lead Channel Sensing Intrinsic Amplitude: 1.375 mV
Lead Channel Sensing Intrinsic Amplitude: 1.375 mV
Lead Channel Sensing Intrinsic Amplitude: 6.5 mV
Lead Channel Sensing Intrinsic Amplitude: 6.5 mV
Lead Channel Setting Pacing Amplitude: 2.25 V
Lead Channel Setting Pacing Amplitude: 2.5 V
Lead Channel Setting Pacing Pulse Width: 0.4 ms
Lead Channel Setting Sensing Sensitivity: 0.3 mV

## 2021-04-09 MED ORDER — SOTALOL HCL (AF) 80 MG PO TABS: 80.0000 mg | ORAL_TABLET | Freq: Two times a day (BID) | ORAL | 11 refills | Status: DC

## 2021-04-09 NOTE — Progress Notes (Signed)
EPIC Encounter for ICM Monitoring  Patient Name: Paul [name] is a 70 y.o. male Date: 04/09/2021 Primary Care Physican: Chesley Noon, MD Primary Cardiologist: Hochrein Electrophysiologist: Allred 04/09/2021 Weight: 192.8 lbs           Spoke with patient and reports feeling well at this time. Heart failure questions reviewed. Pt asymptomatic.   Optivol thoracic impedance normal.   Prescribed: Hydrochlorothiazide 12.5 mg Take 1 capsule (12.5 mg total) by mouth as needed (weight gain 3 lbs or more in 24 hours, 5lbs in a week or swelling).     Labs: 09/21/2020 Creatinine 1.20, BUN 15, Potassium 4.1, Sodium 142, GFR 61-71 02/26/2020 Creatinine 1.17, BUN 9,   Potassium 5.0, Sodium 142, GFR 63-73   Recommendations:  No changes and encouraged to call if experiencing any fluid symptoms.   Follow-up plan: ICM clinic phone appointment on 05/17/2021.   91 day device clinic remote transmission 07/07/2021.     EP/Cardiology Office Visits:  07/22/2021 with Dr Harl Bowie.  Recall 09/18/2021 with Dr Rayann Heman.   Copy of ICM check sent to Dr. Rayann Heman.   3 month ICM trend: 04/07/2021.    1 Year ICM trend:       [name], RN 04/09/2021 12:20 PM

## 2021-04-29 NOTE — Progress Notes (Signed)
Remote ICD transmission.   

## 2021-05-17 ENCOUNTER — Ambulatory Visit (INDEPENDENT_AMBULATORY_CARE_PROVIDER_SITE_OTHER): Payer: Medicare Other

## 2021-05-17 DIAGNOSIS — I5022 Chronic systolic (congestive) heart failure: Secondary | ICD-10-CM

## 2021-05-17 DIAGNOSIS — Z95810 Presence of automatic (implantable) cardiac defibrillator: Secondary | ICD-10-CM

## 2021-05-18 NOTE — Progress Notes (Signed)
EPIC Encounter for ICM Monitoring  Patient Name: Paul [name] is a 70 y.o. male Date: 05/18/2021 Primary Care Physican: Chesley Noon, MD Primary Cardiologist: Hochrein Electrophysiologist: Allred 04/09/2021 Weight: 192.8 lbs           Spoke with wife and reports patient is feeling well at this time. Heart failure questions reviewed. Pt asymptomatic.   Optivol thoracic impedance normal.   Prescribed: Hydrochlorothiazide 12.5 mg Take 1 capsule (12.5 mg total) by mouth as needed (weight gain 3 lbs or more in 24 hours, 5lbs in a week or swelling).     Labs: 09/21/2020 Creatinine 1.20, BUN 15, Potassium 4.1, Sodium 142, GFR 61-71 02/26/2020 Creatinine 1.17, BUN 9,   Potassium 5.0, Sodium 142, GFR 63-73   Recommendations:  No changes and encouraged to call if experiencing any fluid symptoms.   Follow-up plan: ICM clinic phone appointment on 06/21/2021.   91 day device clinic remote transmission 07/07/2021.     EP/Cardiology Office Visits:  07/22/2021 with Dr Harl Bowie.  09/20/2021 with Dr Rayann Heman.   Copy of ICM check sent to Dr. Rayann Heman.    3 month ICM trend: 05/17/2021.    1 Year ICM trend:       [name], RN 05/18/2021 3:10 PM

## 2021-06-21 ENCOUNTER — Ambulatory Visit (INDEPENDENT_AMBULATORY_CARE_PROVIDER_SITE_OTHER): Payer: Medicare Other

## 2021-06-21 DIAGNOSIS — Z95810 Presence of automatic (implantable) cardiac defibrillator: Secondary | ICD-10-CM

## 2021-06-21 DIAGNOSIS — I5022 Chronic systolic (congestive) heart failure: Secondary | ICD-10-CM | POA: Diagnosis not present

## 2021-06-23 NOTE — Progress Notes (Signed)
EPIC Encounter for ICM Monitoring  Patient Name: Paul [name] is a 70 y.o. male Date: 06/23/2021 Primary Care Physican: Chesley Noon, MD Primary Cardiologist: Hochrein Electrophysiologist: Allred 06/23/2021 Weight: 188 lbs           Spoke with wife and reports patient is feeling well at this time. Heart failure questions reviewed. Pt asymptomatic.  Walking 3 miles for 6 days a week.   Optivol thoracic impedance normal.   Prescribed: Hydrochlorothiazide 12.5 mg Take 1 capsule (12.5 mg total) by mouth as needed (weight gain 3 lbs or more in 24 hours, 5lbs in a week or swelling).     Labs: 09/21/2020 Creatinine 1.20, BUN 15, Potassium 4.1, Sodium 142, GFR 61-71 02/26/2020 Creatinine 1.17, BUN 9,   Potassium 5.0, Sodium 142, GFR 63-73   Recommendations:  No changes and encouraged to call if experiencing any fluid symptoms.   Follow-up plan: ICM clinic phone appointment on 08/02/2021.   91 day device clinic remote transmission 07/07/2021.     EP/Cardiology Office Visits:  08/10/2021 with Dr Harl Bowie.  09/20/2021 with Dr Rayann Heman.   Copy of ICM check sent to Dr. Rayann Heman.   3 month ICM trend: 06/21/2021.    1 Year ICM trend:       [name], RN 06/23/2021 2:36 PM

## 2021-07-05 ENCOUNTER — Other Ambulatory Visit: Payer: Self-pay | Admitting: Internal Medicine

## 2021-07-07 ENCOUNTER — Ambulatory Visit (INDEPENDENT_AMBULATORY_CARE_PROVIDER_SITE_OTHER): Payer: Medicare Other

## 2021-07-07 DIAGNOSIS — I255 Ischemic cardiomyopathy: Secondary | ICD-10-CM | POA: Diagnosis not present

## 2021-07-07 LAB — CUP PACEART REMOTE DEVICE CHECK
Battery Remaining Longevity: 33 mo
Battery Voltage: 2.95 V
Brady Statistic AP VP Percent: 0.01 %
Brady Statistic AP VS Percent: 18.06 %
Brady Statistic AS VP Percent: 0.04 %
Brady Statistic AS VS Percent: 81.89 %
Brady Statistic RA Percent Paced: 16.75 %
Brady Statistic RV Percent Paced: 0.05 %
Date Time Interrogation Session: 20220914063423
HighPow Impedance: 66 Ohm
Implantable Lead Implant Date: 20151008
Implantable Lead Implant Date: 20151008
Implantable Lead Location: 753859
Implantable Lead Location: 753860
Implantable Lead Model: 5076
Implantable Lead Model: 6935
Implantable Pulse Generator Implant Date: 20151008
Lead Channel Impedance Value: 285 Ohm
Lead Channel Impedance Value: 342 Ohm
Lead Channel Impedance Value: 418 Ohm
Lead Channel Pacing Threshold Amplitude: 1.125 V
Lead Channel Pacing Threshold Amplitude: 1.25 V
Lead Channel Pacing Threshold Pulse Width: 0.4 ms
Lead Channel Pacing Threshold Pulse Width: 0.4 ms
Lead Channel Sensing Intrinsic Amplitude: 1.25 mV
Lead Channel Sensing Intrinsic Amplitude: 1.25 mV
Lead Channel Sensing Intrinsic Amplitude: 7.875 mV
Lead Channel Sensing Intrinsic Amplitude: 7.875 mV
Lead Channel Setting Pacing Amplitude: 2.25 V
Lead Channel Setting Pacing Amplitude: 2.75 V
Lead Channel Setting Pacing Pulse Width: 0.4 ms
Lead Channel Setting Sensing Sensitivity: 0.3 mV

## 2021-07-15 NOTE — Progress Notes (Signed)
Remote ICD transmission.   

## 2021-07-22 ENCOUNTER — Ambulatory Visit: Payer: Medicare Other | Admitting: Cardiology

## 2021-08-02 ENCOUNTER — Ambulatory Visit (INDEPENDENT_AMBULATORY_CARE_PROVIDER_SITE_OTHER): Payer: Medicare Other

## 2021-08-02 DIAGNOSIS — Z95810 Presence of automatic (implantable) cardiac defibrillator: Secondary | ICD-10-CM | POA: Diagnosis not present

## 2021-08-02 DIAGNOSIS — I5022 Chronic systolic (congestive) heart failure: Secondary | ICD-10-CM

## 2021-08-03 NOTE — Progress Notes (Signed)
EPIC Encounter for ICM Monitoring  Patient Name: Paul [name] is a 70 y.o. male Date: 08/03/2021 Primary Care Physican: Chesley Noon, MD Primary Cardiologist: Hochrein Electrophysiologist: Allred 08/03/2021 Weight: 189 lbs  Time in AT/AF <0.1 hr/day (<0.1%)           Spoke with wife and reports patient is feeling well at this time. Heart failure questions reviewed. Pt asymptomatic.  Walking 3 miles for 6 days a week.   Optivol thoracic impedance normal.   Prescribed: Hydrochlorothiazide 12.5 mg Take 1 capsule (12.5 mg total) by mouth as needed (weight gain 3 lbs or more in 24 hours, 5lbs in a week or swelling).     Labs: 09/21/2020 Creatinine 1.20, BUN 15, Potassium 4.1, Sodium 142, GFR 61-71 02/26/2020 Creatinine 1.17, BUN 9,   Potassium 5.0, Sodium 142, GFR 63-73   Recommendations:  No changes and encouraged to call if experiencing any fluid symptoms.   Follow-up plan: ICM clinic phone appointment on 09/06/2021.   91 day device clinic remote transmission 10/06/2021.     EP/Cardiology Office Visits:  08/10/2021 with Dr Harl Bowie.  09/22/2021 with Dr Rayann Heman.   Copy of ICM check sent to Dr. Rayann Heman.    3 month ICM trend: 08/02/2021.    1 Year ICM trend:       [name], RN 08/03/2021 4:12 PM

## 2021-08-10 ENCOUNTER — Other Ambulatory Visit: Payer: Self-pay

## 2021-08-10 ENCOUNTER — Encounter: Payer: Self-pay | Admitting: Cardiology

## 2021-08-10 ENCOUNTER — Ambulatory Visit: Payer: Medicare Other | Admitting: Cardiology

## 2021-08-10 VITALS — BP 140/78 | HR 63 | Ht 68.0 in | Wt 192.2 lb

## 2021-08-10 DIAGNOSIS — I5022 Chronic systolic (congestive) heart failure: Secondary | ICD-10-CM

## 2021-08-10 DIAGNOSIS — I10 Essential (primary) hypertension: Secondary | ICD-10-CM | POA: Diagnosis not present

## 2021-08-10 DIAGNOSIS — I472 Ventricular tachycardia, unspecified: Secondary | ICD-10-CM | POA: Diagnosis not present

## 2021-08-10 DIAGNOSIS — I2510 Atherosclerotic heart disease of native coronary artery without angina pectoris: Secondary | ICD-10-CM

## 2021-08-10 DIAGNOSIS — E782 Mixed hyperlipidemia: Secondary | ICD-10-CM

## 2021-08-10 NOTE — Progress Notes (Signed)
Clinical Summary Mr. Tipler is a 70 y.o.male seen today for follow up of the following medical problems.   1. CAD/ICM/Chronic systolic HF - history of prior CABG in 1999 as reported below - from notes has not been able to afford entresto and also intolerant to lasix in the past, has used HCTZ as diuretic. Previous fatigue on coreg, has tolerated toprol. Gynecomastia on aldactone.  - 03/2019 echo LVEF 30-35%, grade II dd -07/2014 cath as reported below - fluid well controlled with prn HCTZ   - ICD followed by Dr Rayann Heman. Normal device check 12/2020   - no recent SOB/DOE. No recent edema. No recent chest pains - walking 3 miles in morning.  - compliant with meds     2. History of VT - followed by Dr Rayann Heman - has been on sotalol, has ICD in place - amio allergy   - denies any recent symptoms - 06/2021 normal device check     3. COVID + -diagnosed Jan 2022 - completed course of paxlovid     4. 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, football and basketball   Past Medical History:  Diagnosis Date   Basal cell carcinoma 07/17/2017   nod-L cheek (CX35FU)   BPH (benign prostatic hyperplasia)    CAD (coronary artery disease)    widely patent LAD LIMA graft, widely patent PDA PLR SVG, occluded Diag 1 OM 1 SVG by cardiac CT 2007    CHF (congestive heart failure) ([place])    ECHO 03/2019: EF 30-35%   Fatty liver    GERD (gastroesophageal reflux disease)    Hyperlipidemia    IBS (irritable bowel syndrome)    ICD (implantable cardioverter-defibrillator) battery depletion    Ischemic cardiomyopathy    Prior inferior infarct EF 35% by ECHO 07/13/11 ; ECHO 03/2019: EF 30-35%   S/P CABG (coronary artery bypass graft)    CABG 06/1998 [place] with LIMA to LAD, SVG to OM, SVG to PD-PL.   OM graft occluded 10/12/2005 by CTA    SCC (squamous cell  carcinoma) 11/11/2016   in situ- R anti helix (CX35FU), well diff-R upper crust of helix (CX35FU)   Sleep apnea    Ventricular tachycardia (HCC) 10/15   VT at 188 bpm- unstable requiring external defibrillation     Allergies  Allergen Reactions   Amiodarone     Thyroid issues   Carvedilol     Fatigue    Escitalopram Oxalate     unknown   Lasix [Furosemide]     Stomach upset and nausea   Niacin And Related     Flushing    Spironolactone     gynecomastia     Current Outpatient Medications  Medication Sig Dispense Refill   Ascorbic Acid (VITAMIN C) 1000 MG tablet Take 1,000 mg by mouth daily.     atorvastatin (LIPITOR) 80 MG tablet TAKE (1) TABLET BY MOUTH AT BEDTIME. 90 tablet 1   azelastine (OPTIVAR) 0.05 % ophthalmic solution Place 1 drop into both eyes as needed.      cetirizine (ZYRTEC) 10 MG tablet Take 10 mg by mouth daily.     clopidogrel (PLAVIX) 75 MG tablet Take 1 tablet (75 mg total) by mouth daily. 90 tablet 3   Coenzyme Q10 50 MG CAPS Take 1 capsule  by mouth daily.     eplerenone (INSPRA) 25 MG tablet TAKE ONE TABLET BY MOUTH ONCE DAILY. 90 tablet 0   fenofibrate 160 MG tablet Take 160 mg by mouth daily.     fluticasone (FLONASE) 50 MCG/ACT nasal spray Place 2 sprays into both nostrils daily. 16 g 6   folic acid (FOLVITE) 856 MCG tablet Take 800 mcg by mouth daily.      hydrochlorothiazide (MICROZIDE) 12.5 MG capsule Take 1 capsule (12.5 mg total) by mouth daily as needed (weight gain 3lbs or more in 24 hours 5lbs in a week or swelling). 30 capsule 6   Melatonin 5 MG TABS Take 5 mg by mouth at bedtime.      metoprolol succinate (TOPROL-XL) 50 MG 24 hr tablet TAKE 1 TABLET BY MOUTH ONCE DAILY. TAKE WITH OR IMMEDIATELY FOLLOWING A MEAL. 90 tablet 3   nitroGLYCERIN (NITROSTAT) 0.4 MG SL tablet Place 1 tablet (0.4 mg total) under the tongue as needed for chest pain. 25 tablet 3   Omega-3 Fatty Acids (FISH OIL) 1000 MG CAPS Take 1 capsule by mouth daily.      omeprazole (PRILOSEC) 20 MG capsule Take 20 mg by mouth in the morning and at bedtime.     ramipril (ALTACE) 10 MG capsule TAKE (1) CAPSULE BY MOUTH ONCE DAILY. 90 capsule 0   sildenafil (VIAGRA) 50 MG tablet Take 1 tablet (50 mg total) by mouth daily as needed for erectile dysfunction. 20 tablet 11   SOTALOL AF 80 MG TABS Take 1 tablet (80 mg total) by mouth 2 (two) times daily. 60 tablet 11   triamcinolone cream (KENALOG) 0.1 % Apply 1 application topically 2 (two) times daily as needed. 80 g 2   Vitamin D, Cholecalciferol, 25 MCG (1000 UT) CAPS Take by mouth.      Vitamin E 400 UNITS TABS Take 400 Units by mouth daily.      No current facility-administered medications for this visit.     Past Surgical History:  Procedure Laterality Date   APPENDECTOMY     COLONOSCOPY  06/11/2010   [facility]; mild nonspecific erythematous mucosa in the descending colon s/p biopsied, diverticulosis, internal hemorrhoids.  Pathology with focal active colitis, nonspecific, main consideration includes acute infectious colitis.  NSAID induced colitis and/or Crohn's disease also in the differential but less likely.   CORONARY ARTERY BYPASS GRAFT  1999   IMPLANTABLE CARDIOVERTER DEFIBRILLATOR IMPLANT N/A 07/31/2014   MDT Gwyneth Revels XT DR ICD implanted by Dr Rayann Heman for secondary treatent of VT   LEFT HEART CATHETERIZATION WITH CORONARY/GRAFT ANGIOGRAM N/A 07/30/2014   Procedure: LEFT HEART CATHETERIZATION WITH Beatrix Fetters;  Surgeon: Jacolyn Reedy, MD;  Location: [facility] CATH LAB;  Service: Cardiovascular;  Laterality: N/A;     Allergies  Allergen Reactions   Amiodarone     Thyroid issues   Carvedilol     Fatigue    Escitalopram Oxalate     unknown   Lasix [Furosemide]     Stomach upset and nausea   Niacin And Related     Flushing    Spironolactone     gynecomastia      Family History  Problem Relation Age of Onset   Dementia Mother    Cerebral aneurysm Father    Pneumonia Brother     Stroke Maternal Aunt    Diabetes Neg Hx    Heart attack Neg Hx    Hypertension Neg Hx    Colon cancer Neg Hx  Social History Mr. Deer reports that he has never smoked. He has never used smokeless tobacco. Mr. Dulworth reports current alcohol use of about 2.0 standard drinks per week.   Review of Systems CONSTITUTIONAL: No weight loss, fever, chills, weakness or fatigue.  HEENT: Eyes: No visual loss, blurred vision, double vision or yellow sclerae.No hearing loss, sneezing, congestion, runny nose or sore throat.  SKIN: No rash or itching.  CARDIOVASCULAR: per hpi RESPIRATORY: No shortness of breath, cough or sputum.  GASTROINTESTINAL: No anorexia, nausea, vomiting or diarrhea. No abdominal pain or blood.  GENITOURINARY: No burning on urination, no polyuria NEUROLOGICAL: No headache, dizziness, syncope, paralysis, ataxia, numbness or tingling in the extremities. No change in bowel or bladder control.  MUSCULOSKELETAL: No muscle, back pain, joint pain or stiffness.  LYMPHATICS: No enlarged nodes. No history of splenectomy.  PSYCHIATRIC: No history of depression or anxiety.  ENDOCRINOLOGIC: No reports of sweating, cold or heat intolerance. No polyuria or polydipsia.  Marland Kitchen   Physical Examination Today's Vitals   08/10/21 1143  BP: 140/78  Pulse: 63  SpO2: 97%  Weight: 192 lb 3.2 oz (87.2 kg)  Height: 5\' 8"  (1.727 m)   Body mass index is 29.22 kg/m.  Gen: resting comfortably, no acute distress HEENT: no scleral icterus, pupils equal round and reactive, no palptable cervical adenopathy,  CV: RRR, no m/r/g no jvd Resp: Clear to auscultation bilaterally GI: abdomen is soft, non-tender, non-distended, normal bowel sounds, no hepatosplenomegaly MSK: extremities are warm, no edema.  Skin: warm, no rash Neuro:  no focal deficits Psych: appropriate affect   Diagnostic Studies 03/2019 echo IMPRESSIONS     1. Stage 1: 1: Basal inferolateral segment, mid anterolateral segment,   mid inferolateral segment, and basal inferior segment are abnormal.   2. The left ventricle has moderate-severely reduced systolic function,  with an ejection fraction of 30-35%. The cavity size was severely dilated.  Left ventricular diastolic Doppler parameters are consistent with  pseudonormalization.   3. Left atrial size was mild-moderately dilated.   4. The mitral valve is grossly normal.   5. The tricuspid valve is grossly normal.   6. The aortic valve is tricuspid. Mild thickening of the aortic valve.  Mild calcification of the aortic valve.   7. There is mild dilatation of the aortic root measuring 37 mm.    07/2014 cath IMPRESSIONS:   Severe native two-vessel coronary artery disease with occlusion of the mid LAD, severe stenosis prior to a moderate-sized diagonal Christl Fessenden and moderate disease prior to the circumflex marginal Spenser Cong of the circumflex. Occluded right coronary artery distally with a severe proximal stenosis.  Patent saphenous vein graft to place the right coronary artery and patent mammary graft to LAD, previously occluded vein graft to the marginal system Potential sites of ischemia are the distal posterolateral Alura Olveda of the right coronary artery and moderate size diagonal Cutler Sunday    Assessment and Plan  1. CAD/ICM/Chronic systolic HF - denies any symptoms - medical therapy limited by cost. He will discuss jardiance or farxiga as well as entresto with his inursnce agent who he has a schedule call with coming up. If med change is feasible could repeat echo to clariffy LVEF is still in range for thse meds   2. History of VT - followed by EP, on sotalol and has ICD -no symptoms, continue current meds  3. HTN - home bp's are typically at goal though elevate here, continue current meds   4. Hyperlipidemia - continue atorvastatin, request labs  from pcp   F/u 3 months       Arnoldo Lenis, M.D.

## 2021-08-10 NOTE — Patient Instructions (Addendum)
Medication Instructions:  Continue all current medications.  Labwork: none  Testing/Procedures: none  Follow-Up: 3 months   Any Other Special Instructions Will Be Listed Below (If Applicable).  If you need a refill on your cardiac medications before your next appointment, please call your pharmacy.  

## 2021-08-27 ENCOUNTER — Other Ambulatory Visit: Payer: Self-pay | Admitting: Cardiology

## 2021-08-27 NOTE — Telephone Encounter (Signed)
Extra virgin olive oil does have multiple health benefits. You could either add it to your diet from a food preperation/cooking standpoint or if you prefer the capsules that would be fine as well   Carlyle Dolly MD

## 2021-08-31 ENCOUNTER — Other Ambulatory Visit: Payer: Self-pay | Admitting: *Deleted

## 2021-08-31 MED ORDER — CLOPIDOGREL BISULFATE 75 MG PO TABS: 75.0000 mg | ORAL_TABLET | Freq: Every day | ORAL | 3 refills | Status: DC

## 2021-08-31 MED ORDER — SILDENAFIL CITRATE 50 MG PO TABS: 50.0000 mg | ORAL_TABLET | Freq: Every day | ORAL | 3 refills | Status: DC | PRN

## 2021-09-06 ENCOUNTER — Ambulatory Visit (INDEPENDENT_AMBULATORY_CARE_PROVIDER_SITE_OTHER): Payer: Medicare Other

## 2021-09-06 DIAGNOSIS — Z95810 Presence of automatic (implantable) cardiac defibrillator: Secondary | ICD-10-CM

## 2021-09-06 DIAGNOSIS — I5022 Chronic systolic (congestive) heart failure: Secondary | ICD-10-CM

## 2021-09-08 NOTE — Progress Notes (Signed)
EPIC Encounter for ICM Monitoring  Patient Name: Paul [name] is a 70 y.o. male Date: 09/08/2021 Primary Care Physican: Chesley Noon, MD Primary Cardiologist: Hochrein Electrophysiologist: Allred 09/08/2021 Weight: 187 lbs   Time in AT/AF   0.0 hr/day (0.0%)           Spoke with patient and reports he is feeling well at this time. He continues to walk for exercise.   Optivol thoracic impedance normal.   Prescribed: Hydrochlorothiazide 12.5 mg Take 1 capsule (12.5 mg total) by mouth as needed (weight gain 3 lbs or more in 24 hours, 5lbs in a week or swelling).     Labs: 09/21/2020 Creatinine 1.20, BUN 15, Potassium 4.1, Sodium 142, GFR 61-71 02/26/2020 Creatinine 1.17, BUN 9,   Potassium 5.0, Sodium 142, GFR 63-73   Recommendations:  No changes and encouraged to call if experiencing any fluid symptoms.   Follow-up plan: ICM clinic phone appointment on 10/08/2021.   91 day device clinic remote transmission 10/06/2021.     EP/Cardiology Office Visits:  11/15/2020 with Dr Harl Bowie.  09/22/2021 with Dr Rayann Heman.   Copy of ICM check sent to Dr. Rayann Heman.    3 month ICM trend: 09/06/2021.    12-14 Month ICM trend:       [name], RN 09/08/2021 3:57 PM

## 2021-09-20 ENCOUNTER — Encounter: Payer: Medicare Other | Admitting: Internal Medicine

## 2021-09-22 ENCOUNTER — Other Ambulatory Visit: Payer: Self-pay

## 2021-09-22 ENCOUNTER — Ambulatory Visit: Payer: Medicare Other | Admitting: Internal Medicine

## 2021-09-22 VITALS — BP 118/62 | HR 60 | Ht 68.0 in | Wt 190.0 lb

## 2021-09-22 DIAGNOSIS — I5022 Chronic systolic (congestive) heart failure: Secondary | ICD-10-CM

## 2021-09-22 DIAGNOSIS — G4733 Obstructive sleep apnea (adult) (pediatric): Secondary | ICD-10-CM

## 2021-09-22 DIAGNOSIS — I472 Ventricular tachycardia, unspecified: Secondary | ICD-10-CM | POA: Diagnosis not present

## 2021-09-22 DIAGNOSIS — I255 Ischemic cardiomyopathy: Secondary | ICD-10-CM

## 2021-09-22 LAB — CUP PACEART INCLINIC DEVICE CHECK
Battery Remaining Longevity: 30 mo
Battery Voltage: 2.94 V
Brady Statistic AP VP Percent: 0.01 %
Brady Statistic AP VS Percent: 14.19 %
Brady Statistic AS VP Percent: 0.04 %
Brady Statistic AS VS Percent: 85.76 %
Brady Statistic RA Percent Paced: 13.43 %
Brady Statistic RV Percent Paced: 0.05 %
Date Time Interrogation Session: 20221130151120
HighPow Impedance: 73 Ohm
Implantable Lead Implant Date: 20151008
Implantable Lead Implant Date: 20151008
Implantable Lead Location: 753859
Implantable Lead Location: 753860
Implantable Lead Model: 5076
Implantable Lead Model: 6935
Implantable Pulse Generator Implant Date: 20151008
Lead Channel Impedance Value: 285 Ohm
Lead Channel Impedance Value: 361 Ohm
Lead Channel Impedance Value: 456 Ohm
Lead Channel Pacing Threshold Amplitude: 1 V
Lead Channel Pacing Threshold Amplitude: 1.5 V
Lead Channel Pacing Threshold Pulse Width: 0.4 ms
Lead Channel Pacing Threshold Pulse Width: 0.4 ms
Lead Channel Sensing Intrinsic Amplitude: 1.375 mV
Lead Channel Sensing Intrinsic Amplitude: 1.875 mV
Lead Channel Sensing Intrinsic Amplitude: 7.875 mV
Lead Channel Sensing Intrinsic Amplitude: 9.25 mV
Lead Channel Setting Pacing Amplitude: 2.5 V
Lead Channel Setting Pacing Amplitude: 2.5 V
Lead Channel Setting Pacing Pulse Width: 0.8 ms
Lead Channel Setting Sensing Sensitivity: 0.3 mV

## 2021-09-22 MED ORDER — EPLERENONE 25 MG PO TABS: ORAL_TABLET | ORAL | 0 refills | Status: DC

## 2021-09-22 NOTE — Patient Instructions (Addendum)
Medication Instructions:  Your physician recommends that you continue on your current medications as directed. Please refer to the Current Medication list given to you today. *If you need a refill on your cardiac medications before your next appointment, please call your pharmacy*  Lab Work: None. If you have labs (blood work) drawn today and your tests are completely normal, you will receive your results only by: [place] (if you have MyChart) OR A paper copy in the mail If you have any lab test that is abnormal or we need to change your treatment, we will call you to review the results.  Testing/Procedures: None.  Follow-Up: At [facility], you and your health needs are our priority.  As part of our continuing mission to provide you with exceptional heart care, we have created designated Provider Care Teams.  These Care Teams include your primary Cardiologist (physician) and Advanced Practice Providers (APPs -  Physician Assistants and Nurse Practitioners) who all work together to provide you with the care you need, when you need it.  Your physician wants you to follow-up in: 6 months with one of the following Advanced Practice Providers on your designated Care Team:    Tommye Standard, PA-C    You will receive a reminder letter in the mail two months in advance. If you don't receive a letter, please call our office to schedule the follow-up appointment.  Remote monitoring is used to monitor your ICD from home. This monitoring reduces the number of office visits required to check your device to one time per year. It allows [place] to keep an eye on the functioning of your device to ensure it is working properly. You are scheduled for a device check from home on 10/06/21. You may send your transmission at any time that day. If you have a wireless device, the transmission will be sent automatically. After your physician reviews your transmission, you will receive a postcard with your next  transmission date.  We recommend signing up for the patient portal called "MyChart".  Sign up information is provided on this After Visit Summary.  MyChart is used to connect with patients for Virtual Visits (Telemedicine).  Patients are able to view lab/test results, encounter notes, upcoming appointments, etc.  Non-urgent messages can be sent to your provider as well.   To learn more about what you can do with MyChart, go to [email or web address].    Any Other Special Instructions Will Be Listed Below (If Applicable).

## 2021-09-22 NOTE — Progress Notes (Signed)
PCP: Chesley Noon, MD Primary Cardiologist: Dr Harl Bowie Primary EP: Dr Rayann Heman  Paul [name] is a 70 y.o. male who presents today for routine electrophysiology followup.  Since last being seen in our clinic, the patient reports doing very well.  Today, he denies symptoms of palpitations, chest pain, shortness of breath,  lower extremity edema, dizziness, presyncope, syncope, or ICD shocks.  The patient is otherwise without complaint today.   Past Medical History:  Diagnosis Date   Basal cell carcinoma 07/17/2017   nod-L cheek (CX35FU)   BPH (benign prostatic hyperplasia)    CAD (coronary artery disease)    widely patent LAD LIMA graft, widely patent PDA PLR SVG, occluded Diag 1 OM 1 SVG by cardiac CT 2007    CHF (congestive heart failure) ([place])    ECHO 03/2019: EF 30-35%   Fatty liver    GERD (gastroesophageal reflux disease)    Hyperlipidemia    IBS (irritable bowel syndrome)    ICD (implantable cardioverter-defibrillator) battery depletion    Ischemic cardiomyopathy    Prior inferior infarct EF 35% by ECHO 07/13/11 ; ECHO 03/2019: EF 30-35%   S/P CABG (coronary artery bypass graft)    CABG 06/1998 [place], [place] with LIMA to LAD, SVG to OM, SVG to PD-PL.   OM graft occluded 10/12/2005 by CTA    SCC (squamous cell carcinoma) 11/11/2016   in situ- R anti helix (CX35FU), well diff-R upper crust of helix (CX35FU)   Sleep apnea    Ventricular tachycardia 10/15   VT at 188 bpm- unstable requiring external defibrillation   Past Surgical History:  Procedure Laterality Date   APPENDECTOMY     COLONOSCOPY  06/11/2010   [place]; mild nonspecific erythematous mucosa in the descending colon s/p biopsied, diverticulosis, internal hemorrhoids.  Pathology with focal active colitis, nonspecific, main consideration includes acute infectious colitis.  NSAID induced colitis and/or Crohn's disease also in the differential but less likely.   CORONARY ARTERY BYPASS GRAFT  1999   IMPLANTABLE  CARDIOVERTER DEFIBRILLATOR IMPLANT N/A 07/31/2014   MDT Gwyneth Revels XT DR ICD implanted by Dr Rayann Heman for secondary treatent of VT   LEFT HEART CATHETERIZATION WITH CORONARY/GRAFT ANGIOGRAM N/A 07/30/2014   Procedure: LEFT HEART CATHETERIZATION WITH Beatrix Fetters;  Surgeon: Jacolyn Reedy, MD;  Location: [facility] CATH LAB;  Service: Cardiovascular;  Laterality: N/A;    ROS- all systems are reviewed and negative except as per HPI above  Current Outpatient Medications  Medication Sig Dispense Refill   Ascorbic Acid (VITAMIN C) 1000 MG tablet Take 1,000 mg by mouth daily.     atorvastatin (LIPITOR) 80 MG tablet TAKE (1) TABLET BY MOUTH AT BEDTIME. 90 tablet 1   azelastine (OPTIVAR) 0.05 % ophthalmic solution Place 1 drop into both eyes as needed.      cetirizine (ZYRTEC) 10 MG tablet Take 10 mg by mouth daily.     clopidogrel (PLAVIX) 75 MG tablet Take 1 tablet (75 mg total) by mouth daily. 90 tablet 3   Coenzyme Q10 50 MG CAPS Take 1 capsule by mouth daily.     dicyclomine (BENTYL) 20 MG tablet Take 20 mg by mouth 3 (three) times daily as needed.     eplerenone (INSPRA) 25 MG tablet TAKE ONE TABLET BY MOUTH ONCE DAILY. 90 tablet 0   fenofibrate 160 MG tablet Take 160 mg by mouth daily.     fluticasone (FLONASE) 50 MCG/ACT nasal spray Place 2 sprays into both nostrils daily. 16 g 6  folic acid (FOLVITE) 599 MCG tablet Take 800 mcg by mouth daily.      hydrochlorothiazide (MICROZIDE) 12.5 MG capsule Take 1 capsule (12.5 mg total) by mouth daily as needed (weight gain 3lbs or more in 24 hours 5lbs in a week or swelling). 30 capsule 6   Melatonin 5 MG TABS Take 5 mg by mouth at bedtime.      metoprolol succinate (TOPROL-XL) 50 MG 24 hr tablet TAKE 1 TABLET BY MOUTH ONCE DAILY. TAKE WITH OR IMMEDIATELY FOLLOWING A MEAL. 90 tablet 1   nitroGLYCERIN (NITROSTAT) 0.4 MG SL tablet Place 1 tablet (0.4 mg total) under the tongue as needed for chest pain. 25 tablet 3   Omega-3 Fatty Acids (FISH OIL) 1000 MG  CAPS Take 1 capsule by mouth daily.     omeprazole (PRILOSEC) 20 MG capsule Take 20 mg by mouth in the morning and at bedtime.     ramipril (ALTACE) 10 MG capsule TAKE (1) CAPSULE BY MOUTH ONCE DAILY. 90 capsule 0   sildenafil (VIAGRA) 50 MG tablet Take 1 tablet (50 mg total) by mouth daily as needed for erectile dysfunction. 20 tablet 3   SOTALOL AF 80 MG TABS Take 1 tablet (80 mg total) by mouth 2 (two) times daily. 60 tablet 11   triamcinolone cream (KENALOG) 0.1 % Apply 1 application topically 2 (two) times daily as needed. 80 g 2   Vitamin D, Cholecalciferol, 25 MCG (1000 UT) CAPS Take by mouth.      Vitamin E 400 UNITS TABS Take 400 Units by mouth daily.      No current facility-administered medications for this visit.    Physical Exam: Vitals:   09/22/21 1416  BP: 118/62  Pulse: 60  SpO2: 97%  Weight: 190 lb (86.2 kg)  Height: 5\' 8"  (1.727 m)    GEN- The patient is well appearing, alert and oriented x 3 today.   Head- normocephalic, atraumatic Eyes-  Sclera clear, conjunctiva pink Ears- hearing intact Oropharynx- clear Lungs- Clear to ausculation bilaterally, normal work of breathing Chest- ICD pocket is well healed Heart- Regular rate and rhythm, no murmurs, rubs or gallops, PMI not laterally displaced GI- soft, NT, ND, + BS Extremities- no clubbing, cyanosis, or edema  ICD interrogation- reviewed in detail today,  See PACEART report  ekg tracing ordered today is personally reviewed and shows sinus rhythm 60 bpm, PR 164 msec, Qtc 442 msec  Wt Readings from Last 3 Encounters:  09/22/21 190 lb (86.2 kg)  08/10/21 192 lb 3.2 oz (87.2 kg)  03/16/21 198 lb 6.4 oz (90 kg)    Assessment and Plan:  1.  Chronic systolic dysfunction/ CAD/ ischemic CM euvolemic today No ischemic symptoms Stable on an appropriate medical regimen Normal ICD function See [facility] Art report No changes today he is not device dependant today followed in ICM device clinic  2. VT Currently  well controlled with sotalol Qt is stable Labs 06/30/21 reviewed His magnesium was increased then.  He has scheduled follow-up with Dr Melford Aase in several weeks  3. OSA Uses dental appliance  Risks, benefits and potential toxicities for medications prescribed and/or refilled reviewed with patient today.   Return to see EP APP in 6 months  Thompson Grayer MD, [facility] 09/22/2021 2:29 PM

## 2021-10-06 ENCOUNTER — Ambulatory Visit (INDEPENDENT_AMBULATORY_CARE_PROVIDER_SITE_OTHER): Payer: Medicare Other

## 2021-10-06 DIAGNOSIS — I255 Ischemic cardiomyopathy: Secondary | ICD-10-CM | POA: Diagnosis not present

## 2021-10-06 LAB — CUP PACEART REMOTE DEVICE CHECK
Battery Remaining Longevity: 30 mo
Battery Voltage: 2.94 V
Brady Statistic AP VP Percent: 0.01 %
Brady Statistic AP VS Percent: 14.39 %
Brady Statistic AS VP Percent: 0.05 %
Brady Statistic AS VS Percent: 85.56 %
Brady Statistic RA Percent Paced: 13.68 %
Brady Statistic RV Percent Paced: 0.06 %
Date Time Interrogation Session: 20221214022703
HighPow Impedance: 73 Ohm
Implantable Lead Implant Date: 20151008
Implantable Lead Implant Date: 20151008
Implantable Lead Location: 753859
Implantable Lead Location: 753860
Implantable Lead Model: 5076
Implantable Lead Model: 6935
Implantable Pulse Generator Implant Date: 20151008
Lead Channel Impedance Value: 304 Ohm
Lead Channel Impedance Value: 399 Ohm
Lead Channel Impedance Value: 456 Ohm
Lead Channel Pacing Threshold Amplitude: 1.125 V
Lead Channel Pacing Threshold Amplitude: 1.625 V
Lead Channel Pacing Threshold Pulse Width: 0.4 ms
Lead Channel Pacing Threshold Pulse Width: 0.4 ms
Lead Channel Sensing Intrinsic Amplitude: 1.5 mV
Lead Channel Sensing Intrinsic Amplitude: 1.5 mV
Lead Channel Sensing Intrinsic Amplitude: 9.125 mV
Lead Channel Sensing Intrinsic Amplitude: 9.125 mV
Lead Channel Setting Pacing Amplitude: 2.25 V
Lead Channel Setting Pacing Amplitude: 2.5 V
Lead Channel Setting Pacing Pulse Width: 0.8 ms
Lead Channel Setting Sensing Sensitivity: 0.3 mV

## 2021-10-08 ENCOUNTER — Ambulatory Visit (INDEPENDENT_AMBULATORY_CARE_PROVIDER_SITE_OTHER): Payer: Medicare Other

## 2021-10-08 DIAGNOSIS — Z95810 Presence of automatic (implantable) cardiac defibrillator: Secondary | ICD-10-CM | POA: Diagnosis not present

## 2021-10-08 DIAGNOSIS — I5022 Chronic systolic (congestive) heart failure: Secondary | ICD-10-CM | POA: Diagnosis not present

## 2021-10-08 NOTE — Progress Notes (Signed)
EPIC Encounter for ICM Monitoring  Patient Name: Paul [name] is a 70 y.o. male Date: 10/08/2021 Primary Care Physican: Chesley Noon, MD Primary Cardiologist: Hochrein Electrophysiologist: Allred 09/08/2021 Weight: 187 lbs   Time in AT/AF    0.0 hr/day (0.0%)           Spoke with patient and reports he is feeling well at this time.     Optivol thoracic impedance normal.   Prescribed: Hydrochlorothiazide 12.5 mg Take 1 capsule (12.5 mg total) by mouth as needed (weight gain 3 lbs or more in 24 hours, 5lbs in a week or swelling).     Labs: 09/21/2020 Creatinine 1.20, BUN 15, Potassium 4.1, Sodium 142, GFR 61-71 02/26/2020 Creatinine 1.17, BUN 9,   Potassium 5.0, Sodium 142, GFR 63-73   Recommendations:  No changes and encouraged to call if experiencing any fluid symptoms.   Follow-up plan: ICM clinic phone appointment on 11/08/2021.   91 day device clinic remote transmission 01/05/2022.     EP/Cardiology Office Visits:  11/15/2020 with Dr Harl Bowie.     Copy of ICM check sent to Dr. Rayann Heman.    3 month ICM trend: 10/08/2021.    12-14 Month ICM trend:       [name], RN 10/08/2021 5:09 PM

## 2021-10-19 NOTE — Progress Notes (Signed)
Remote ICD transmission.   

## 2021-11-08 ENCOUNTER — Ambulatory Visit (INDEPENDENT_AMBULATORY_CARE_PROVIDER_SITE_OTHER): Payer: Medicare Other

## 2021-11-08 DIAGNOSIS — I5022 Chronic systolic (congestive) heart failure: Secondary | ICD-10-CM | POA: Diagnosis not present

## 2021-11-08 DIAGNOSIS — Z95810 Presence of automatic (implantable) cardiac defibrillator: Secondary | ICD-10-CM | POA: Diagnosis not present

## 2021-11-11 NOTE — Progress Notes (Signed)
EPIC Encounter for ICM Monitoring  Patient Name: Paul [name] is a 71 y.o. male Date: 11/11/2021 Primary Care Physican: Chesley Noon, MD Primary Cardiologist: Hochrein Electrophysiologist: Allred 11/11/2021 Weight: 187 lbs   Time in AT/AF    0.0 hr/day (0.0%)           Spoke with patient and reports he is feeling well at this time.     Optivol thoracic impedance normal.   Prescribed: Hydrochlorothiazide 12.5 mg Take 1 capsule (12.5 mg total) by mouth as needed (weight gain 3 lbs or more in 24 hours, 5lbs in a week or swelling).     Labs: 09/21/2020 Creatinine 1.20, BUN 15, Potassium 4.1, Sodium 142, GFR 61-71 02/26/2020 Creatinine 1.17, BUN 9,   Potassium 5.0, Sodium 142, GFR 63-73   Recommendations:  No changes and encouraged to call if experiencing any fluid symptoms.   Follow-up plan: ICM clinic phone appointment on 12/13/2021.   91 day device clinic remote transmission 01/05/2022.     EP/Cardiology Office Visits:  11/15/2020 with Dr Harl Bowie.     Copy of ICM check sent to Dr. Rayann Heman.    3 month ICM trend: 11/08/2021.    12-14 Month ICM trend:     [name], RN 11/11/2021 3:51 PM

## 2021-11-15 ENCOUNTER — Encounter: Payer: Self-pay | Admitting: Cardiology

## 2021-11-15 ENCOUNTER — Other Ambulatory Visit: Payer: Self-pay

## 2021-11-15 ENCOUNTER — Telehealth: Payer: Self-pay | Admitting: Cardiology

## 2021-11-15 ENCOUNTER — Ambulatory Visit: Payer: Medicare Other | Admitting: Cardiology

## 2021-11-15 VITALS — BP 138/80 | HR 62 | Ht 68.5 in | Wt 191.6 lb

## 2021-11-15 DIAGNOSIS — E782 Mixed hyperlipidemia: Secondary | ICD-10-CM

## 2021-11-15 DIAGNOSIS — I472 Ventricular tachycardia, unspecified: Secondary | ICD-10-CM | POA: Diagnosis not present

## 2021-11-15 DIAGNOSIS — I5022 Chronic systolic (congestive) heart failure: Secondary | ICD-10-CM

## 2021-11-15 DIAGNOSIS — I10 Essential (primary) hypertension: Secondary | ICD-10-CM

## 2021-11-15 NOTE — Telephone Encounter (Signed)
Checking percert on the following patient for testing scheduled at [facility].      ECHO   12/15/2021

## 2021-11-15 NOTE — Progress Notes (Signed)
Clinical Summary [name] is a 71 y.o.male seen today for follow up of the following medical problems.    1. CAD/ICM/Chronic systolic HF - history of prior CABG in 1999 as reported below - from notes has not been able to afford entresto and also intolerant to lasix in the past, has used HCTZ as diuretic. Previous fatigue on coreg, has tolerated toprol. Gynecomastia on aldactone.  - 03/2019 echo LVEF 30-35%, grade II dd -07/2014 cath as reported below - fluid well controlled with prn HCTZ   - ICD followed by Dr Paul [name]. Normal device check 12/2020    - recent talks with his insurance agent, reports entresto and farxiga/jardiance remains expensive.   - no SOB/DOE, no chest pains, no LE edema - walks over 2 miles without symptoms regularly.       2. History of VT - followed by Dr Paul [name] - has been on sotalol, has ICD in place - amio allergy    - 09/2021 normal device check - no recent symptoms       3. Hyperlipidemia 07/2020 TC 155 TG 97 HDL 63 LDL 74 - he report recent labs with pcp     4.HTN - compliatn with meds    5. Weakness/fatigue - episodes last 5-10 minutes - feel weak, sweaty, hot all over. No chest pains, no associated symptoms - recent labs with pcp, had normal thyroid he reports.  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, football and basketball   Past Medical History:  Diagnosis Date   Basal cell carcinoma 07/17/2017   nod-L cheek (CX35FU)   BPH (benign prostatic hyperplasia)    CAD (coronary artery disease)    widely patent LAD LIMA graft, widely patent PDA PLR SVG, occluded Diag 1 OM 1 SVG by cardiac CT 2007    CHF (congestive heart failure) ([name])    ECHO 03/2019: EF 30-35%   Fatty liver    GERD (gastroesophageal reflux disease)    Hyperlipidemia    IBS (irritable bowel syndrome)    ICD (implantable cardioverter-defibrillator) battery depletion    Ischemic cardiomyopathy    Prior inferior infarct EF 35% by ECHO 07/13/11 ; ECHO  03/2019: EF 30-35%   S/P CABG (coronary artery bypass graft)    CABG 06/1998 Paul [name], [place] with LIMA to LAD, SVG to OM, SVG to PD-PL.   OM graft occluded 10/12/2005 by CTA    SCC (squamous cell carcinoma) 11/11/2016   in situ- R anti helix (CX35FU), well diff-R upper crust of helix (CX35FU)   Sleep apnea    Ventricular tachycardia 10/15   VT at 188 bpm- unstable requiring external defibrillation     Allergies  Allergen Reactions   Amiodarone     Thyroid issues   Carvedilol     Fatigue    Escitalopram Oxalate     unknown   Lasix [Furosemide]     Stomach upset and nausea   Niacin And Related     Flushing    Spironolactone     gynecomastia     Current Outpatient Medications  Medication Sig Dispense Refill   Ascorbic Acid (VITAMIN C) 1000 MG tablet Take 1,000 mg by mouth daily.     atorvastatin (LIPITOR) 80 MG tablet TAKE (1) TABLET BY MOUTH AT BEDTIME. 90 tablet 1   azelastine (OPTIVAR) 0.05 % ophthalmic solution Place 1 drop into both eyes as needed.      cetirizine (ZYRTEC) 10 MG tablet Take 10 mg  by mouth daily.     clopidogrel (PLAVIX) 75 MG tablet Take 1 tablet (75 mg total) by mouth daily. 90 tablet 3   Coenzyme Q10 50 MG CAPS Take 1 capsule by mouth daily.     dicyclomine (BENTYL) 20 MG tablet Take 20 mg by mouth 3 (three) times daily as needed.     eplerenone (INSPRA) 25 MG tablet TAKE ONE TABLET BY MOUTH ONCE DAILY. 90 tablet 0   fenofibrate 160 MG tablet Take 160 mg by mouth daily.     fluticasone (FLONASE) 50 MCG/ACT nasal spray Place 2 sprays into both nostrils daily. 16 g 6   folic acid (FOLVITE) 366 MCG tablet Take 800 mcg by mouth daily.      hydrochlorothiazide (MICROZIDE) 12.5 MG capsule Take 1 capsule (12.5 mg total) by mouth daily as needed (weight gain 3lbs or more in 24 hours 5lbs in a week or swelling). 30 capsule 6   Melatonin 5 MG TABS Take 5 mg by mouth at bedtime.      metoprolol succinate (TOPROL-XL) 50 MG 24 hr tablet TAKE 1 TABLET BY MOUTH ONCE  DAILY. TAKE WITH OR IMMEDIATELY FOLLOWING A MEAL. 90 tablet 1   nitroGLYCERIN (NITROSTAT) 0.4 MG SL tablet Place 1 tablet (0.4 mg total) under the tongue as needed for chest pain. 25 tablet 3   Omega-3 Fatty Acids (FISH OIL) 1000 MG CAPS Take 1 capsule by mouth daily.     omeprazole (PRILOSEC) 20 MG capsule Take 20 mg by mouth in the morning and at bedtime.     ramipril (ALTACE) 10 MG capsule TAKE (1) CAPSULE BY MOUTH ONCE DAILY. 90 capsule 0   sildenafil (VIAGRA) 50 MG tablet Take 1 tablet (50 mg total) by mouth daily as needed for erectile dysfunction. 20 tablet 3   SOTALOL AF 80 MG TABS Take 1 tablet (80 mg total) by mouth 2 (two) times daily. 60 tablet 11   triamcinolone cream (KENALOG) 0.1 % Apply 1 application topically 2 (two) times daily as needed. 80 g 2   Vitamin D, Cholecalciferol, 25 MCG (1000 UT) CAPS Take by mouth.      Vitamin E 400 UNITS TABS Take 400 Units by mouth daily.      No current facility-administered medications for this visit.     Past Surgical History:  Procedure Laterality Date   APPENDECTOMY     COLONOSCOPY  06/11/2010   Paul [name]; mild nonspecific erythematous mucosa in the descending colon s/p biopsied, diverticulosis, internal hemorrhoids.  Pathology with focal active colitis, nonspecific, main consideration includes acute infectious colitis.  NSAID induced colitis and/or Crohn's disease also in the differential but less likely.   CORONARY ARTERY BYPASS GRAFT  1999   IMPLANTABLE CARDIOVERTER DEFIBRILLATOR IMPLANT N/A 07/31/2014   MDT Paul [name] XT DR ICD implanted by Dr Paul [name] for secondary treatent of VT   LEFT HEART CATHETERIZATION WITH CORONARY/GRAFT ANGIOGRAM N/A 07/30/2014   Procedure: LEFT HEART CATHETERIZATION WITH Paul [name];  Surgeon: Paul Reedy, MD;  Location: [name];  Service: Cardiovascular;  Laterality: N/A;     Allergies  Allergen Reactions   Amiodarone     Thyroid issues   Carvedilol     Fatigue    Escitalopram  Oxalate     unknown   Lasix [Furosemide]     Stomach upset and nausea   Niacin And Related     Flushing    Spironolactone     gynecomastia      Family History  Problem  Relation Age of Onset   Dementia Mother    Cerebral aneurysm Father    Pneumonia Brother    Stroke Maternal Aunt    Diabetes Neg Hx    Heart attack Neg Hx    Hypertension Neg Hx    Colon cancer Neg Hx      Social History Mr. Lubitz reports that he has never smoked. He has never used smokeless tobacco. Mr. Brabec reports current alcohol use of about 2.0 standard drinks per week.   Review of Systems CONSTITUTIONAL: per hpi HEENT: Eyes: No visual loss, blurred vision, double vision or yellow sclerae.No hearing loss, sneezing, congestion, runny nose or sore throat.  SKIN: No rash or itching.  CARDIOVASCULAR: per hpi RESPIRATORY: No shortness of breath, cough or sputum.  GASTROINTESTINAL: No anorexia, nausea, vomiting or diarrhea. No abdominal pain or blood.  GENITOURINARY: No burning on urination, no polyuria NEUROLOGICAL: No headache, dizziness, syncope, paralysis, ataxia, numbness or tingling in the extremities. No change in bowel or bladder control.  MUSCULOSKELETAL: No muscle, back pain, joint pain or stiffness.  LYMPHATICS: No enlarged nodes. No history of splenectomy.  PSYCHIATRIC: No history of depression or anxiety.  ENDOCRINOLOGIC: No reports of sweating, cold or heat intolerance. No polyuria or polydipsia.  Marland Kitchen   Physical Examination Today's Vitals   11/15/21 1416  BP: 138/80  Pulse: 62  SpO2: 99%  Weight: 191 lb 9.6 oz (86.9 kg)  Height: 5' 8.5" (1.74 m)   Body mass index is 28.71 kg/m.  Gen: resting comfortably, no acute distress HEENT: no scleral icterus, pupils equal round and reactive, no palptable cervical adenopathy,  CV: RRR, no m/r/g no jvd Resp: Clear to auscultation bilaterally GI: abdomen is soft, non-tender, non-distended, normal bowel sounds, no hepatosplenomegaly MSK:  extremities are warm, no edema.  Skin: warm, no rash Neuro:  no focal deficits Psych: appropriate affect   Diagnostic Studies  03/2019 echo IMPRESSIONS     1. Stage 1: 1: Basal inferolateral segment, mid anterolateral segment,  mid inferolateral segment, and basal inferior segment are abnormal.   2. The left ventricle has moderate-severely reduced systolic function,  with an ejection fraction of 30-35%. The cavity size was severely dilated.  Left ventricular diastolic Doppler parameters are consistent with  pseudonormalization.   3. Left atrial size was mild-moderately dilated.   4. The mitral valve is grossly normal.   5. The tricuspid valve is grossly normal.   6. The aortic valve is tricuspid. Mild thickening of the aortic valve.  Mild calcification of the aortic valve.   7. There is mild dilatation of the aortic root measuring 37 mm.    07/2014 cath IMPRESSIONS:   Severe native two-vessel coronary artery disease with occlusion of the mid LAD, severe stenosis prior to a moderate-sized diagonal Tanika Bracco and moderate disease prior to the circumflex marginal Gurbani Figge of the circumflex. Occluded right coronary artery distally with a severe proximal stenosis.  Patent saphenous vein graft to place the right coronary artery and patent mammary graft to LAD, previously occluded vein graft to the marginal system Potential sites of ischemia are the distal posterolateral Kariyah Baugh of the right coronary artery and moderate size diagonal Dorion Petillo   Assessment and Plan  1. CAD/ICM/Chronic systolic HF - medical therapy limited by cost, he had recentlyt checked on costs of entresto and jardiance with his insurance. He prefers to stay on current meds but if were some decline in his LVEF he would consider chagning - we will repeat echo   2. History  of VT - followed by EP, on sotalol and has ICD -recent normal device checks, continue current management   3. HTN - home bp's are typically at goal  though elevate here - continue current meds   4. Hyperlipidemia -requset labs from pcp - continue atorvastatin      Arnoldo Lenis, M.D.

## 2021-11-15 NOTE — Patient Instructions (Signed)
Testing/Procedures: Your physician has requested that you have an echocardiogram. Echocardiography is a painless test that uses sound waves to create images of your heart. It provides your doctor with information about the size and shape of your heart and how well your hearts chambers and valves are working. This procedure takes approximately one hour. There are no restrictions for this procedure.   Follow-Up: Follow up with Dr. Harl Bowie in 6 months  If you need a refill on your cardiac medications before your next appointment, please call your pharmacy.

## 2021-12-06 ENCOUNTER — Other Ambulatory Visit: Payer: Self-pay | Admitting: Cardiology

## 2021-12-07 ENCOUNTER — Telehealth: Payer: Self-pay

## 2021-12-07 NOTE — Telephone Encounter (Signed)
Pt left a voicemail stating he went through the scanner at [place] clerks office today.  LMOVM for patient to send a transmission with his home remote monitor. I told him the nurse will review it and call him if she see anything. If everything looks good she will not call. If he had any questions I left the device clinic number for him to call back.

## 2021-12-08 NOTE — Telephone Encounter (Signed)
I let the patient know that per [facility] rep, he can go through any scanner.   I called [employer] with the patient on the line because he received the error code [id number]. They are sending him a new handheld in 7-10 business days.

## 2021-12-13 ENCOUNTER — Ambulatory Visit (INDEPENDENT_AMBULATORY_CARE_PROVIDER_SITE_OTHER): Payer: Medicare Other

## 2021-12-13 DIAGNOSIS — Z95810 Presence of automatic (implantable) cardiac defibrillator: Secondary | ICD-10-CM

## 2021-12-13 DIAGNOSIS — I5022 Chronic systolic (congestive) heart failure: Secondary | ICD-10-CM | POA: Diagnosis not present

## 2021-12-14 NOTE — Progress Notes (Signed)
EPIC Encounter for ICM Monitoring  Patient Name: Paul [name] is a 72 y.o. male Date: 12/14/2021 Primary Care Physican: Chesley Noon, MD Primary Cardiologist: Hochrein Electrophysiologist: Allred 11/11/2021 Weight: 187 lbs 12/14/2021 Weight: 185.3 lbs   Time in AT/AF    0.0 hr/day (0.0%)           Spoke with patient and reports he is feeling well at this time.  Continues to exercise.    Optivol thoracic impedance normal.   Prescribed: Hydrochlorothiazide 12.5 mg Take 1 capsule (12.5 mg total) by mouth as needed (weight gain 3 lbs or more in 24 hours, 5lbs in a week or swelling).     Labs: 09/30/2021 Creatinine 1.16, BUN 14, Potassium 4.7, Sodium 141, GFR 28 A complete set of results can be found in Results Review.   Recommendations:  No changes and encouraged to call if experiencing any fluid symptoms.   Follow-up plan: ICM clinic phone appointment on 01/17/2022.   91 day device clinic remote transmission 01/05/2022.     EP/Cardiology Office Visits:  05/16/2022 with Dr Harl Bowie.  03/04/2022 with Dr Rayann Heman.   Copy of ICM check sent to Dr. Rayann Heman.    3 month ICM trend: 12/13/2021.    12-14 Month ICM trend:     [name], RN 12/14/2021 2:57 PM

## 2021-12-15 ENCOUNTER — Ambulatory Visit (HOSPITAL_COMMUNITY)
Admission: RE | Admit: 2021-12-15 | Discharge: 2021-12-15 | Disposition: A | Discharge status: Home / Self Care | Payer: Medicare Other | Source: Ambulatory Visit | Attending: Cardiology | Admitting: Cardiology

## 2021-12-15 DIAGNOSIS — I5022 Chronic systolic (congestive) heart failure: Secondary | ICD-10-CM | POA: Diagnosis not present

## 2021-12-15 LAB — ECHOCARDIOGRAM COMPLETE
AR max vel: 1.57 cm2
AV Area VTI: 1.43 cm2
AV Area mean vel: 1.54 cm2
AV Mean grad: 3.5 mmHg
AV Peak grad: 7.5 mmHg
Ao pk vel: 1.37 m/s
Area-P 1/2: 2.2 cm2
Calc EF: 40.3 %
MV VTI: 1.75 cm2
S' Lateral: 6 cm
Single Plane A2C EF: 48.8 %
Single Plane A4C EF: 31.6 %

## 2021-12-15 NOTE — Progress Notes (Signed)
*  PRELIMINARY RESULTS* Echocardiogram 2D Echocardiogram has been performed.  Paul [name] 12/15/2021, 1:48 PM

## 2021-12-21 ENCOUNTER — Telehealth: Payer: Self-pay | Admitting: *Deleted

## 2021-12-21 NOTE — Telephone Encounter (Signed)
-----   Message from Arnoldo Lenis, MD sent at 12/19/2021  9:55 AM EST ----- Heart function is stable from prior study, remains moderately decreased.

## 2021-12-21 NOTE — Telephone Encounter (Signed)
Laurine Blazer, LPN  12/16/3610  2:44 PM EST Back to Top    Notified, copy to pcp.

## 2022-01-05 ENCOUNTER — Ambulatory Visit (INDEPENDENT_AMBULATORY_CARE_PROVIDER_SITE_OTHER): Payer: Medicare Other

## 2022-01-05 DIAGNOSIS — I5022 Chronic systolic (congestive) heart failure: Secondary | ICD-10-CM

## 2022-01-05 DIAGNOSIS — I472 Ventricular tachycardia, unspecified: Secondary | ICD-10-CM

## 2022-01-05 LAB — CUP PACEART REMOTE DEVICE CHECK
Battery Remaining Longevity: 27 mo
Battery Voltage: 2.94 V
Brady Statistic AP VP Percent: 0 %
Brady Statistic AP VS Percent: 3.6 %
Brady Statistic AS VP Percent: 0.05 %
Brady Statistic AS VS Percent: 96.34 %
Brady Statistic RA Percent Paced: 3.56 %
Brady Statistic RV Percent Paced: 0.05 %
Date Time Interrogation Session: 20230315023324
HighPow Impedance: 75 Ohm
Implantable Lead Implant Date: 20151008
Implantable Lead Implant Date: 20151008
Implantable Lead Location: 753859
Implantable Lead Location: 753860
Implantable Lead Model: 5076
Implantable Lead Model: 6935
Implantable Pulse Generator Implant Date: 20151008
Lead Channel Impedance Value: 304 Ohm
Lead Channel Impedance Value: 361 Ohm
Lead Channel Impedance Value: 456 Ohm
Lead Channel Pacing Threshold Amplitude: 1.25 V
Lead Channel Pacing Threshold Amplitude: 1.625 V
Lead Channel Pacing Threshold Pulse Width: 0.4 ms
Lead Channel Pacing Threshold Pulse Width: 0.4 ms
Lead Channel Sensing Intrinsic Amplitude: 1.5 mV
Lead Channel Sensing Intrinsic Amplitude: 1.5 mV
Lead Channel Sensing Intrinsic Amplitude: 8.25 mV
Lead Channel Sensing Intrinsic Amplitude: 8.25 mV
Lead Channel Setting Pacing Amplitude: 2.5 V
Lead Channel Setting Pacing Amplitude: 2.5 V
Lead Channel Setting Pacing Pulse Width: 0.8 ms
Lead Channel Setting Sensing Sensitivity: 0.3 mV

## 2022-01-08 ENCOUNTER — Other Ambulatory Visit: Payer: Self-pay | Admitting: Internal Medicine

## 2022-01-09 ENCOUNTER — Other Ambulatory Visit: Payer: Self-pay | Admitting: Internal Medicine

## 2022-01-10 ENCOUNTER — Encounter: Payer: Self-pay | Admitting: *Deleted

## 2022-01-10 NOTE — Telephone Encounter (Signed)
This encounter was created in error - please disregard.

## 2022-01-11 ENCOUNTER — Encounter: Payer: Self-pay | Admitting: Internal Medicine

## 2022-01-17 ENCOUNTER — Ambulatory Visit (INDEPENDENT_AMBULATORY_CARE_PROVIDER_SITE_OTHER): Payer: Medicare Other

## 2022-01-17 DIAGNOSIS — I5022 Chronic systolic (congestive) heart failure: Secondary | ICD-10-CM | POA: Diagnosis not present

## 2022-01-17 DIAGNOSIS — Z95810 Presence of automatic (implantable) cardiac defibrillator: Secondary | ICD-10-CM | POA: Diagnosis not present

## 2022-01-17 NOTE — Progress Notes (Signed)
Remote ICD transmission.   

## 2022-01-19 NOTE — Progress Notes (Signed)
EPIC Encounter for ICM Monitoring ? ?Patient Name: Paul [name] is a 71 y.o. male ?Date: 01/19/2022 ?Primary Care Physican: Chesley Noon, MD ?Primary Cardiologist: Percival Spanish ?Electrophysiologist: Allred ?11/11/2021 Weight: 187 lbs ?12/14/2021 Weight: 185.3 lbs ?01/19/2022 Weight: 183 lbs ?  ?Time in AT/AF    0.0 hr/day (0.0%) ?  ?        ?Spoke with patient and reports he is feeling well physically at this time but has been emotional day because he buried his mother today.   ?  ?Optivol thoracic impedance normal. ?  ?Prescribed: Hydrochlorothiazide 12.5 mg Take 1 capsule (12.5 mg total) by mouth as needed (weight gain 3 lbs or more in 24 hours, 5 lbs in a week or swelling).   ?  ?Labs: ?09/30/2021 Creatinine 1.16, BUN 14, Potassium 4.7, Sodium 141, GFR 28 ?A complete set of results can be found in Results Review. ?  ?Recommendations:  No changes and encouraged to call if experiencing any fluid symptoms. ?  ?Follow-up plan: ICM clinic phone appointment on 02/21/2022.   91 day device clinic remote transmission 04/06/2022.   ?  ?EP/Cardiology Office Visits:  05/16/2022 with Dr Harl Bowie.  03/04/2022 with Dr Rayann Heman. ?  ?Copy of ICM check sent to Dr. Rayann Heman.    ? ?3 month ICM trend: 01/17/2022. ? ? ? ?12-14 Month ICM trend:  ? ? ? ?[name], RN ?01/19/2022 ?3:42 PM ? ?

## 2022-01-28 ENCOUNTER — Encounter: Payer: Self-pay | Admitting: Cardiology

## 2022-01-28 ENCOUNTER — Other Ambulatory Visit: Payer: Self-pay | Admitting: *Deleted

## 2022-01-28 MED ORDER — RAMIPRIL 10 MG PO CAPS: 10.0000 mg | ORAL_CAPSULE | Freq: Every day | ORAL | 3 refills | Status: DC

## 2022-02-17 ENCOUNTER — Other Ambulatory Visit: Payer: Self-pay | Admitting: *Deleted

## 2022-02-17 ENCOUNTER — Encounter: Payer: Self-pay | Admitting: Cardiology

## 2022-02-17 MED ORDER — METOPROLOL SUCCINATE ER 50 MG PO TB24: 50.0000 mg | ORAL_TABLET | Freq: Every day | ORAL | 1 refills | Status: DC

## 2022-02-21 ENCOUNTER — Ambulatory Visit (INDEPENDENT_AMBULATORY_CARE_PROVIDER_SITE_OTHER): Payer: Medicare Other

## 2022-02-21 DIAGNOSIS — Z95810 Presence of automatic (implantable) cardiac defibrillator: Secondary | ICD-10-CM

## 2022-02-21 DIAGNOSIS — I5022 Chronic systolic (congestive) heart failure: Secondary | ICD-10-CM | POA: Diagnosis not present

## 2022-02-23 NOTE — Progress Notes (Signed)
EPIC Encounter for ICM Monitoring ? ?Patient Name: Paul [name] is a 71 y.o. male ?Date: 02/23/2022 ?Primary Care Physican: Chesley Noon, MD ?Primary Cardiologist: Percival Spanish ?Electrophysiologist: Allred ?01/19/2022 Weight: 183 lbs ?02/23/2022   Weight: 182 lbs ?  ?Time in AT/AF    <0.1 hr/day (<0.1%) ?  ?        ?Spoke with patient and heart failure questions reviewed.  Pt asymptomatic for fluid accumulation.  He continues to walk for exercise.  ?  ?Optivol thoracic impedance normal. ?  ?Prescribed: Hydrochlorothiazide 12.5 mg Take 1 capsule (12.5 mg total) by mouth as needed (weight gain 3 lbs or more in 24 hours, 5 lbs in a week or swelling).   ?  ?Labs: ?09/30/2021 Creatinine 1.16, BUN 14, Potassium 4.7, Sodium 141, GFR 28 ?A complete set of results can be found in Results Review. ?  ?Recommendations:  No changes and encouraged to call if experiencing any fluid symptoms. ?  ?Follow-up plan: ICM clinic phone appointment on 03/28/2022.   91 day device clinic remote transmission 04/06/2022.   ?  ?EP/Cardiology Office Visits:  05/16/2022 with Dr Harl Bowie.  03/04/2022 with Dr Rayann Heman. ?  ?Copy of ICM check sent to Dr. Rayann Heman.    ? ?3 month ICM trend: 02/23/2022. ? ? ? ?12-14 Month ICM trend:  ? ? ? ?[name], RN ?02/23/2022 ?3:38 PM ? ?

## 2022-03-04 ENCOUNTER — Encounter: Payer: Medicare Other | Admitting: Internal Medicine

## 2022-03-04 ENCOUNTER — Encounter: Payer: Self-pay | Admitting: Emergency Medicine

## 2022-03-04 ENCOUNTER — Ambulatory Visit
Admission: EM | Admit: 2022-03-04 | Discharge: 2022-03-04 | Disposition: A | Discharge status: Home / Self Care | Payer: Medicare Other | Attending: Emergency Medicine | Admitting: Emergency Medicine

## 2022-03-04 ENCOUNTER — Other Ambulatory Visit: Payer: Self-pay

## 2022-03-04 ENCOUNTER — Ambulatory Visit (INDEPENDENT_AMBULATORY_CARE_PROVIDER_SITE_OTHER): Payer: Medicare Other

## 2022-03-04 DIAGNOSIS — J988 Other specified respiratory disorders: Secondary | ICD-10-CM | POA: Diagnosis not present

## 2022-03-04 DIAGNOSIS — J029 Acute pharyngitis, unspecified: Secondary | ICD-10-CM | POA: Diagnosis not present

## 2022-03-04 DIAGNOSIS — B9789 Other viral agents as the cause of diseases classified elsewhere: Secondary | ICD-10-CM | POA: Diagnosis not present

## 2022-03-04 DIAGNOSIS — R051 Acute cough: Secondary | ICD-10-CM | POA: Insufficient documentation

## 2022-03-04 LAB — POCT RAPID STREP A (OFFICE): Rapid Strep A Screen: NEGATIVE

## 2022-03-04 MED ORDER — AMOXICILLIN 500 MG PO TABS: 500.0000 mg | ORAL_TABLET | Freq: Two times a day (BID) | ORAL | 0 refills | Status: AC

## 2022-03-04 MED ORDER — BENZONATATE 200 MG PO CAPS: 200.0000 mg | ORAL_CAPSULE | Freq: Three times a day (TID) | ORAL | 0 refills | Status: AC | PRN

## 2022-03-04 NOTE — Discharge Instructions (Addendum)
I have sent off COVID, flu, strep culture.  Your rapid strep was negative.  Your chest x-ray was negative for a pneumonia.  I will prescribe molnupiravir or Tamiflu if your COVID or flu come back positive respectively.  I am sending you home with a wait-and-see prescription of amoxicillin to cover strep throat.  If your throat culture comes back positive for strep, go ahead and start it.  If it is negative for strep, you will not develop strep throat and you do not need the antibiotics.  Tessalon for the cough, 500 to 1000 mg of Tylenol 3-4 times a day as needed for pain.  Restart Flonase and saline nasal irrigation with a Milta Deiters Med rinse and distilled water as often as you want. ?

## 2022-03-04 NOTE — ED Triage Notes (Addendum)
Pt reports cough for last several days. Pt reports last night developed sore throat and reports "feels like my throat is swelling and I want to make sure it isn't strep throat."  ? ?Home covid test was negative yesterday. ? ?Airway patent. Nad noted.  ?

## 2022-03-04 NOTE — ED Provider Notes (Signed)
HPI ? ?SUBJECTIVE: ? ?Paul [name] is a 71 y.o. male who presents with 2 days of a "hard" dry cough, nasal congestion, clear rhinorrhea, postnasal drip, and the acute onset of sore throat starting at 0200 this morning.  No fevers, body aches, headaches, loss of sense or smell or taste, sinus pain or pressure, wheezing.  He reports diffuse chest pain described as soreness secondary to the cough.  No shortness of breath, nausea, vomiting, diarrhea, abdominal pain, chest congestion.  Denies muffled voice, neck stiffness, drooling, trismus, difficulty breathing or swallowing.  States he had a negative home COVID test.  He has had 5 COVID vaccinations and this years flu vaccination.  No known exposure to COVID or flu.  No GERD symptoms.  Symptoms are worse with swallowing, no alleviating factors.  He has not tried anything for this. ? ?Patient has a complex medical history including coronary artery disease status post CABG, CHF, GERD, hyperlipidemia, ventricular tachycardia with an ICD, ischemic cardiomyopathy, COVID in 2022.  He is on Plavix.  No history of pulmonary disease, smoking, diabetes, hypertension, chronic kidney disease PCP: [facility] ? ? ?Past Medical History:  ?Diagnosis Date  ? Basal cell carcinoma 07/17/2017  ? nod-L cheek (CX35FU)  ? BPH (benign prostatic hyperplasia)   ? CAD (coronary artery disease)   ? widely patent LAD LIMA graft, widely patent PDA PLR SVG, occluded Diag 1 OM 1 SVG by cardiac CT 2007   ? CHF (congestive heart failure) ([place])   ? ECHO 03/2019: EF 30-35%  ? Fatty liver   ? GERD (gastroesophageal reflux disease)   ? Hyperlipidemia   ? IBS (irritable bowel syndrome)   ? ICD (implantable cardioverter-defibrillator) battery depletion   ? Ischemic cardiomyopathy   ? Prior inferior infarct EF 35% by ECHO 07/13/11 ; ECHO 03/2019: EF 30-35%  ? S/P CABG (coronary artery bypass graft)   ? CABG 06/1998 [place], [place] with LIMA to LAD, SVG to OM, SVG to PD-PL.   OM graft  occluded 10/12/2005 by CTA   ? SCC (squamous cell carcinoma) 11/11/2016  ? in situ- R anti helix (CX35FU), well diff-R upper crust of helix (CX35FU)  ? Sleep apnea   ? Ventricular tachycardia ([place]) 10/15  ? VT at 188 bpm- unstable requiring external defibrillation  ? ? ?Past Surgical History:  ?Procedure Laterality Date  ? APPENDECTOMY    ? COLONOSCOPY  06/11/2010  ? [place]; mild nonspecific erythematous mucosa in the descending colon s/p biopsied, diverticulosis, internal hemorrhoids.  Pathology with focal active colitis, nonspecific, main consideration includes acute infectious colitis.  NSAID induced colitis and/or Crohn's disease also in the differential but less likely.  ? CORONARY ARTERY BYPASS GRAFT  1999  ? IMPLANTABLE CARDIOVERTER DEFIBRILLATOR IMPLANT N/A 07/31/2014  ? MDT Evalyn Casco DR ICD implanted by Dr Rayann Heman for secondary treatent of VT  ? LEFT HEART CATHETERIZATION WITH CORONARY/GRAFT ANGIOGRAM N/A 07/30/2014  ? Procedure: LEFT HEART CATHETERIZATION WITH Beatrix Fetters;  Surgeon: Jacolyn Reedy, MD;  Location: [facility] CATH LAB;  Service: Cardiovascular;  Laterality: N/A;  ? ? ?Family History  ?Problem Relation Age of Onset  ? Dementia Mother   ? Cerebral aneurysm Father   ? Pneumonia Brother   ? Stroke Maternal Aunt   ? Diabetes Neg Hx   ? Heart attack Neg Hx   ? Hypertension Neg Hx   ? Colon cancer Neg Hx   ? ? ?Social History  ? ?Tobacco Use  ? Smoking status: Never  ?  Smokeless tobacco: Never  ?Vaping Use  ? Vaping Use: Never used  ?Substance Use Topics  ? Alcohol use: Yes  ?  Alcohol/week: 2.0 standard drinks  ?  Types: 2 Glasses of wine per week  ?  Comment: Couple beers a day and wine with dinner.   ? Drug use: No  ? ? ?No current facility-administered medications for this encounter. ? ?Current Outpatient Medications:  ?  amoxicillin (AMOXIL) 500 MG tablet, Take 1 tablet (500 mg total) by mouth 2 (two) times daily for 10 days., Disp: 20 tablet, Rfl: 0 ?  benzonatate (TESSALON) 200 MG  capsule, Take 1 capsule (200 mg total) by mouth 3 (three) times daily as needed for cough., Disp: 30 capsule, Rfl: 0 ?  Ascorbic Acid (VITAMIN C) 1000 MG tablet, Take 1,000 mg by mouth daily., Disp: , Rfl:  ?  atorvastatin (LIPITOR) 80 MG tablet, TAKE (1) TABLET BY MOUTH AT BEDTIME., Disp: 90 tablet, Rfl: 1 ?  cetirizine (ZYRTEC) 10 MG tablet, Take 10 mg by mouth daily., Disp: , Rfl:  ?  clopidogrel (PLAVIX) 75 MG tablet, Take 1 tablet (75 mg total) by mouth daily., Disp: 90 tablet, Rfl: 3 ?  Coenzyme Q10 50 MG CAPS, Take 1 capsule by mouth daily., Disp: , Rfl:  ?  dicyclomine (BENTYL) 20 MG tablet, Take 20 mg by mouth 3 (three) times daily as needed., Disp: , Rfl:  ?  eplerenone (INSPRA) 25 MG tablet, TAKE ONE TABLET BY MOUTH ONCE DAILY., Disp: 90 tablet, Rfl: 1 ?  fenofibrate 160 MG tablet, Take 160 mg by mouth daily., Disp: , Rfl:  ?  fluticasone (FLONASE) 50 MCG/ACT nasal spray, Place 2 sprays into both nostrils daily. (Patient taking differently: Place 2 sprays into both nostrils as needed.), Disp: 16 g, Rfl: 6 ?  folic acid (FOLVITE) 798 MCG tablet, Take 800 mcg by mouth daily. , Disp: , Rfl:  ?  hydrochlorothiazide (MICROZIDE) 12.5 MG capsule, Take 1 capsule (12.5 mg total) by mouth daily as needed (weight gain 3lbs or more in 24 hours 5lbs in a week or swelling)., Disp: 30 capsule, Rfl: 6 ?  Melatonin 5 MG TABS, Take 5 mg by mouth at bedtime. , Disp: , Rfl:  ?  metoprolol succinate (TOPROL-XL) 50 MG 24 hr tablet, Take 1 tablet (50 mg total) by mouth daily., Disp: 90 tablet, Rfl: 1 ?  nitroGLYCERIN (NITROSTAT) 0.4 MG SL tablet, Place 1 tablet (0.4 mg total) under the tongue as needed for chest pain., Disp: 25 tablet, Rfl: 3 ?  Omega-3 Fatty Acids (FISH OIL) 1000 MG CAPS, Take 1 capsule by mouth daily., Disp: , Rfl:  ?  omeprazole (PRILOSEC) 20 MG capsule, Take 20 mg by mouth in the morning and at bedtime., Disp: , Rfl:  ?  OVER THE COUNTER MEDICATION, 1,250 mg daily. Extra Virgin Building control surveyor. Gel capsule.,  Disp: , Rfl:  ?  ramipril (ALTACE) 10 MG capsule, Take 1 capsule (10 mg total) by mouth daily., Disp: 90 capsule, Rfl: 3 ?  sildenafil (VIAGRA) 50 MG tablet, Take 1 tablet (50 mg total) by mouth daily as needed for erectile dysfunction., Disp: 20 tablet, Rfl: 3 ?  SOTALOL AF 80 MG TABS, Take 1 tablet (80 mg total) by mouth 2 (two) times daily., Disp: 60 tablet, Rfl: 11 ?  triamcinolone cream (KENALOG) 0.1 %, Apply 1 application topically 2 (two) times daily as needed., Disp: 80 g, Rfl: 2 ?  Vitamin D, Cholecalciferol, 25 MCG (1000 UT) CAPS, Take by  mouth. , Disp: , Rfl:  ?  Vitamin E 400 UNITS TABS, Take 400 Units by mouth daily. , Disp: , Rfl:  ? ?Allergies  ?Allergen Reactions  ? Amiodarone   ?  Thyroid issues  ? Carvedilol   ?  Fatigue ?  ? Escitalopram Oxalate   ?  unknown  ? Lasix [Furosemide]   ?  Stomach upset and nausea  ? Niacin And Related   ?  Flushing ?  ? Penicillins   ?  Can take amoxicillin, and azithromycin but all others "upset my stomach"  ? Spironolactone   ?  gynecomastia  ? ? ? ?ROS ? ?As noted in HPI.  ? ?Physical Exam ? ?BP (!) 166/99 (BP Location: Right Arm)   Pulse 68   Temp 98.8 ?F (37.1 ?C) (Oral)   Resp 18   Ht '5\' 8"'$  (1.727 m)   Wt 82.1 kg   SpO2 97%   BMI 27.52 kg/m?  ? ?Constitutional: Well developed, well nourished, no acute distress.   ?Eyes:  EOMI, conjunctiva normal bilaterally ?HENT: Normocephalic, atraumatic,mucus membranes moist.  Nasal congestion.  Slightly erythematous oropharynx.  Normal sized tonsils without exudates.  Uvula midline.  Unable to completely visualize posterior oropharynx. ?Neck: Positive cervical lymphadenopathy ?Respiratory: Normal inspiratory effort, lungs clear bilaterally ?Cardiovascular: Normal rate regular rhythm, no murmurs rubs or gallops ?GI: nondistended ?skin: No rash, skin intact ?Musculoskeletal: no deformities ?Neurologic: Alert & oriented x 3, no focal neuro deficits ?Psychiatric: Speech and behavior appropriate ? ? ?ED  Course ? ? ?Medications - No data to display ? ?Orders Placed This Encounter  ?Procedures  ? Culture, group A strep  ?  Standing Status:   Standing  ?  Number of Occurrences:   1  ? Coronavirus (COVID-19) with Influenza A and Influenza B

## 2022-03-05 LAB — COVID-19, FLU A+B NAA
Influenza A, NAA: NOT DETECTED
Influenza B, NAA: NOT DETECTED
SARS-CoV-2, NAA: NOT DETECTED

## 2022-03-07 ENCOUNTER — Telehealth: Payer: Self-pay | Admitting: Internal Medicine

## 2022-03-07 LAB — CULTURE, GROUP A STREP (THRC)

## 2022-03-28 ENCOUNTER — Ambulatory Visit (INDEPENDENT_AMBULATORY_CARE_PROVIDER_SITE_OTHER): Payer: Medicare Other

## 2022-03-28 DIAGNOSIS — I5022 Chronic systolic (congestive) heart failure: Secondary | ICD-10-CM | POA: Diagnosis not present

## 2022-03-28 DIAGNOSIS — Z95810 Presence of automatic (implantable) cardiac defibrillator: Secondary | ICD-10-CM | POA: Diagnosis not present

## 2022-03-30 NOTE — Progress Notes (Signed)
EPIC Encounter for ICM Monitoring  Patient Name: Paul [name] is a 71 y.o. male Date: 03/30/2022 Primary Care Physican: Chesley Noon, MD Primary Cardiologist: Hochrein Electrophysiologist: Allred 01/19/2022 Weight: 183 lbs 02/23/2022   Weight: 182 lbs 03/30/2022 Weight: 180 lbs   Time in AT/AF   0.0 hr/day (0.0%)           Spoke with patient and heart failure questions reviewed.  Pt asymptomatic for fluid accumulation.  He had a cold but has resolved.  He did eat foods with more salt over the holiday weekend but back on track with diet and exercise.   Optivol thoracic impedance normal but was suggesting possible fluid accumulation during Memorial Day holiday.     Prescribed: Hydrochlorothiazide 12.5 mg Take 1 capsule (12.5 mg total) by mouth as needed (weight gain 3 lbs or more in 24 hours, 5 lbs in a week or swelling).     Labs: 09/30/2021 Creatinine 1.16, BUN 14, Potassium 4.7, Sodium 141, GFR 28 A complete set of results can be found in Results Review.   Recommendations:  No changes and encouraged to call if experiencing any fluid symptoms.   Follow-up plan: ICM clinic phone appointment on 05/02/2022.   91 day device clinic remote transmission 04/06/2022.     EP/Cardiology Office Visits:  05/16/2022 with Dr Harl Bowie.  04/01/2022 with Dr Rayann Heman.   Copy of ICM check sent to Dr. Rayann Heman.     3 month ICM trend: 03/28/2022.    12-14 Month ICM trend:     [name], RN 03/30/2022 4:24 PM

## 2022-04-01 ENCOUNTER — Encounter: Payer: Self-pay | Admitting: Internal Medicine

## 2022-04-01 ENCOUNTER — Ambulatory Visit: Payer: Medicare Other | Admitting: Internal Medicine

## 2022-04-01 VITALS — BP 118/75 | HR 63 | Ht 68.0 in | Wt 183.0 lb

## 2022-04-01 DIAGNOSIS — G4733 Obstructive sleep apnea (adult) (pediatric): Secondary | ICD-10-CM

## 2022-04-01 DIAGNOSIS — I2510 Atherosclerotic heart disease of native coronary artery without angina pectoris: Secondary | ICD-10-CM

## 2022-04-01 DIAGNOSIS — I255 Ischemic cardiomyopathy: Secondary | ICD-10-CM

## 2022-04-01 DIAGNOSIS — I5022 Chronic systolic (congestive) heart failure: Secondary | ICD-10-CM | POA: Diagnosis not present

## 2022-04-01 DIAGNOSIS — I472 Ventricular tachycardia, unspecified: Secondary | ICD-10-CM | POA: Diagnosis not present

## 2022-04-01 LAB — CUP PACEART INCLINIC DEVICE CHECK
Date Time Interrogation Session: 20230609160010
Implantable Lead Implant Date: 20151008
Implantable Lead Implant Date: 20151008
Implantable Lead Location: 753859
Implantable Lead Location: 753860
Implantable Lead Model: 5076
Implantable Lead Model: 6935
Implantable Pulse Generator Implant Date: 20151008

## 2022-04-01 NOTE — Progress Notes (Signed)
PCP: Chesley Noon, MD Primary Cardiologist: Dr Harl Bowie Primary EP: Dr Rayann Heman  Paul [name] is a 71 y.o. male who presents today for routine electrophysiology followup.  Since last being seen in our clinic, the patient reports doing very well.  Today, he denies symptoms of palpitations, chest pain, shortness of breath,  lower extremity edema, dizziness, presyncope, syncope, or ICD shocks.  The patient is otherwise without complaint today.   Past Medical History:  Diagnosis Date   Basal cell carcinoma 07/17/2017   nod-L cheek (CX35FU)   BPH (benign prostatic hyperplasia)    CAD (coronary artery disease)    widely patent LAD LIMA graft, widely patent PDA PLR SVG, occluded Diag 1 OM 1 SVG by cardiac CT 2007    CHF (congestive heart failure) ([place])    ECHO 03/2019: EF 30-35%   Fatty liver    GERD (gastroesophageal reflux disease)    Hyperlipidemia    IBS (irritable bowel syndrome)    ICD (implantable cardioverter-defibrillator) battery depletion    Ischemic cardiomyopathy    Prior inferior infarct EF 35% by ECHO 07/13/11 ; ECHO 03/2019: EF 30-35%   S/P CABG (coronary artery bypass graft)    CABG 06/1998 [place], [place] with LIMA to LAD, SVG to OM, SVG to PD-PL.   OM graft occluded 10/12/2005 by CTA    SCC (squamous cell carcinoma) 11/11/2016   in situ- R anti helix (CX35FU), well diff-R upper crust of helix (CX35FU)   Sleep apnea    Ventricular tachycardia ([place]) 10/15   VT at 188 bpm- unstable requiring external defibrillation   Past Surgical History:  Procedure Laterality Date   APPENDECTOMY     COLONOSCOPY  06/11/2010   [place]; mild nonspecific erythematous mucosa in the descending colon s/p biopsied, diverticulosis, internal hemorrhoids.  Pathology with focal active colitis, nonspecific, main consideration includes acute infectious colitis.  NSAID induced colitis and/or Crohn's disease also in the differential but less likely.   CORONARY ARTERY BYPASS GRAFT  1999    IMPLANTABLE CARDIOVERTER DEFIBRILLATOR IMPLANT N/A 07/31/2014   MDT Gwyneth Revels XT DR ICD implanted by Dr Rayann Heman for secondary treatent of VT   LEFT HEART CATHETERIZATION WITH CORONARY/GRAFT ANGIOGRAM N/A 07/30/2014   Procedure: LEFT HEART CATHETERIZATION WITH Beatrix Fetters;  Surgeon: Jacolyn Reedy, MD;  Location: [facility] CATH LAB;  Service: Cardiovascular;  Laterality: N/A;    ROS- all systems are reviewed and negative except as per HPI above  Current Outpatient Medications  Medication Sig Dispense Refill   Ascorbic Acid (VITAMIN C) 1000 MG tablet Take 1,000 mg by mouth daily.     atorvastatin (LIPITOR) 80 MG tablet TAKE (1) TABLET BY MOUTH AT BEDTIME. 90 tablet 1   benzonatate (TESSALON) 200 MG capsule Take 1 capsule (200 mg total) by mouth 3 (three) times daily as needed for cough. 30 capsule 0   cetirizine (ZYRTEC) 10 MG tablet Take 10 mg by mouth daily.     clopidogrel (PLAVIX) 75 MG tablet Take 1 tablet (75 mg total) by mouth daily. 90 tablet 3   Coenzyme Q10 50 MG CAPS Take 1 capsule by mouth daily.     dicyclomine (BENTYL) 20 MG tablet Take 20 mg by mouth 3 (three) times daily as needed.     eplerenone (INSPRA) 25 MG tablet TAKE ONE TABLET BY MOUTH ONCE DAILY. 90 tablet 1   fenofibrate 160 MG tablet Take 160 mg by mouth daily.     fluticasone (FLONASE) 50 MCG/ACT nasal spray Place 2 sprays into  both nostrils daily. (Patient taking differently: Place 2 sprays into both nostrils as needed.) 16 g 6   folic acid (FOLVITE) 676 MCG tablet Take 800 mcg by mouth daily.      hydrochlorothiazide (MICROZIDE) 12.5 MG capsule Take 1 capsule (12.5 mg total) by mouth daily as needed (weight gain 3lbs or more in 24 hours 5lbs in a week or swelling). 30 capsule 6   MAGNESIUM GLYCINATE PO Take 200 mg by mouth in the morning and at bedtime.     Melatonin 5 MG TABS Take 5 mg by mouth at bedtime.      metoprolol succinate (TOPROL-XL) 50 MG 24 hr tablet Take 1 tablet (50 mg total) by mouth daily. 90  tablet 1   nitroGLYCERIN (NITROSTAT) 0.4 MG SL tablet Place 1 tablet (0.4 mg total) under the tongue as needed for chest pain. 25 tablet 3   Omega-3 Fatty Acids (FISH OIL) 1000 MG CAPS Take 1 capsule by mouth daily.     omeprazole (PRILOSEC) 20 MG capsule Take 20 mg by mouth in the morning and at bedtime.     OVER THE COUNTER MEDICATION 1,250 mg daily. Extra Virgin Building control surveyor. Gel capsule.     ramipril (ALTACE) 10 MG capsule Take 1 capsule (10 mg total) by mouth daily. 90 capsule 3   sildenafil (VIAGRA) 50 MG tablet Take 1 tablet (50 mg total) by mouth daily as needed for erectile dysfunction. 20 tablet 3   SOTALOL AF 80 MG TABS Take 1 tablet (80 mg total) by mouth 2 (two) times daily. 60 tablet 11   Vitamin D, Cholecalciferol, 25 MCG (1000 UT) CAPS Take by mouth.      Vitamin E 400 UNITS TABS Take 400 Units by mouth daily.      No current facility-administered medications for this visit.    Physical Exam: Vitals:   04/01/22 1338  Pulse: 63  Weight: 183 lb (83 kg)  Height: '5\' 8"'$  (1.727 m)    GEN- The patient is well appearing, alert and oriented x 3 today.   Head- normocephalic, atraumatic Eyes-  Sclera clear, conjunctiva pink Ears- hearing intact Oropharynx- clear Lungs- Clear to ausculation bilaterally, normal work of breathing Chest- ICD pocket is well healed Heart- Regular rate and rhythm, no murmurs, rubs or gallops, PMI not laterally displaced GI- soft, NT, ND, + BS Extremities- no clubbing, cyanosis, or edema  ICD interrogation- reviewed in detail today,  See PACEART report  ekg tracing ordered today is personally reviewed and shows sinus, nonspecific ST/T changes, Qtc 458 msec  Wt Readings from Last 3 Encounters:  04/01/22 183 lb (83 kg)  03/04/22 181 lb (82.1 kg)  11/15/21 191 lb 9.6 oz (86.9 kg)    Assessment and Plan:  1.  Chronic systolic dysfunction/ CAD/ ischemic CM euvolemic today Stable on an appropriate medical regimen Normal ICD function See [facility] Art  report No changes today he is not device dependant today followed in ICM device clinic  2. VT Controlled with sotalol Qt is stable Labs due.  He has an appointment next week with Dr Melford Aase next week and will have then drawn then.  3. OSA Uses dental appliance  Risks, benefits and potential toxicities for medications prescribed and/or refilled reviewed with patient today.   Return in 12 months Follow with Dr Harl Bowie as scheduled  Thompson Grayer MD, [facility] 04/01/2022 1:43 PM

## 2022-04-01 NOTE — Patient Instructions (Signed)
Medication Instructions:  Your physician recommends that you continue on your current medications as directed. Please refer to the Current Medication list given to you today.  *If you need a refill on your cardiac medications before your next appointment, please call your pharmacy*   Lab Work: None ordered.  If you have labs (blood work) drawn today and your tests are completely normal, you will receive your results only by: [place] (if you have MyChart) OR A paper copy in the mail If you have any lab test that is abnormal or we need to change your treatment, we will call you to review the results.   Testing/Procedures: None ordered.    Follow-Up: At [facility], you and your health needs are our priority.  As part of our continuing mission to provide you with exceptional heart care, we have created designated Provider Care Teams.  These Care Teams include your primary Cardiologist (physician) and Advanced Practice Providers (APPs -  Physician Assistants and Nurse Practitioners) who all work together to provide you with the care you need, when you need it.  We recommend signing up for the patient portal called "MyChart".  Sign up information is provided on this After Visit Summary.  MyChart is used to connect with patients for Virtual Visits (Telemedicine).  Patients are able to view lab/test results, encounter notes, upcoming appointments, etc.  Non-urgent messages can be sent to your provider as well.   To learn more about what you can do with MyChart, go to [email or web address].    Your next appointment:   12 months with Dr Rayann Heman  Important Information About Sugar

## 2022-04-06 ENCOUNTER — Ambulatory Visit (INDEPENDENT_AMBULATORY_CARE_PROVIDER_SITE_OTHER): Payer: Medicare Other

## 2022-04-06 DIAGNOSIS — I255 Ischemic cardiomyopathy: Secondary | ICD-10-CM

## 2022-04-08 LAB — CUP PACEART REMOTE DEVICE CHECK
Battery Remaining Longevity: 28 mo
Battery Voltage: 2.93 V
Brady Statistic AP VP Percent: 0.01 %
Brady Statistic AP VS Percent: 6.24 %
Brady Statistic AS VP Percent: 0.04 %
Brady Statistic AS VS Percent: 93.71 %
Brady Statistic RA Percent Paced: 6.21 %
Brady Statistic RV Percent Paced: 0.05 %
Date Time Interrogation Session: 20230614031706
HighPow Impedance: 71 Ohm
Implantable Lead Implant Date: 20151008
Implantable Lead Implant Date: 20151008
Implantable Lead Location: 753859
Implantable Lead Location: 753860
Implantable Lead Model: 5076
Implantable Lead Model: 6935
Implantable Pulse Generator Implant Date: 20151008
Lead Channel Impedance Value: 285 Ohm
Lead Channel Impedance Value: 304 Ohm
Lead Channel Impedance Value: 418 Ohm
Lead Channel Pacing Threshold Amplitude: 1.25 V
Lead Channel Pacing Threshold Amplitude: 1.25 V
Lead Channel Pacing Threshold Pulse Width: 0.4 ms
Lead Channel Pacing Threshold Pulse Width: 0.4 ms
Lead Channel Sensing Intrinsic Amplitude: 1.625 mV
Lead Channel Sensing Intrinsic Amplitude: 1.625 mV
Lead Channel Sensing Intrinsic Amplitude: 7.5 mV
Lead Channel Sensing Intrinsic Amplitude: 7.5 mV
Lead Channel Setting Pacing Amplitude: 2.5 V
Lead Channel Setting Pacing Amplitude: 2.5 V
Lead Channel Setting Pacing Pulse Width: 0.8 ms
Lead Channel Setting Sensing Sensitivity: 0.3 mV

## 2022-04-12 ENCOUNTER — Encounter: Payer: Self-pay | Admitting: Internal Medicine

## 2022-04-12 ENCOUNTER — Other Ambulatory Visit: Payer: Self-pay | Admitting: *Deleted

## 2022-04-12 MED ORDER — SOTALOL HCL (AF) 80 MG PO TABS: 80.0000 mg | ORAL_TABLET | Freq: Two times a day (BID) | ORAL | 11 refills | Status: DC

## 2022-04-18 NOTE — Progress Notes (Signed)
Remote ICD transmission.   

## 2022-05-02 ENCOUNTER — Ambulatory Visit (INDEPENDENT_AMBULATORY_CARE_PROVIDER_SITE_OTHER): Payer: Medicare Other

## 2022-05-02 DIAGNOSIS — Z95810 Presence of automatic (implantable) cardiac defibrillator: Secondary | ICD-10-CM | POA: Diagnosis not present

## 2022-05-02 DIAGNOSIS — I5022 Chronic systolic (congestive) heart failure: Secondary | ICD-10-CM

## 2022-05-05 NOTE — Progress Notes (Signed)
EPIC Encounter for ICM Monitoring  Patient Name: Paul [name] is a 71 y.o. male Date: 05/05/2022 Primary Care Physican: Chesley Noon, MD Primary Cardiologist: Hochrein Electrophysiologist: Allred 01/19/2022 Weight: 183 lbs 02/23/2022   Weight: 182 lbs 03/30/2022 Weight: 180 lbs 05/05/2022 Weight: 179 lbs   Time in AT/AF   0.0 hr/day (0.0%)           Spoke with patient and heart failure questions reviewed.  Pt asymptomatic for fluid accumulation.   Unsure what may have contributed to decreased impedance.    Optivol thoracic impedance suggesting possible fluid accumulation starting 6/21 but returned to normal.     Prescribed: Hydrochlorothiazide 12.5 mg Take 1 capsule (12.5 mg total) by mouth as needed (weight gain 3 lbs or more in 24 hours, 5 lbs in a week or swelling).     Labs: 09/30/2021 Creatinine 1.16, BUN 14, Potassium 4.7, Sodium 141, GFR 28 A complete set of results can be found in Results Review.   Recommendations:  No changes and encouraged to call if experiencing any fluid symptoms.   Follow-up plan: ICM clinic phone appointment on 06/06/2022.   91 day device clinic remote transmission 07/06/2022.     EP/Cardiology Office Visits:  05/16/2022 with Dr Harl Bowie.  03/31/2023 with Dr Rayann Heman.   Copy of ICM check sent to Dr. Rayann Heman.     3 month ICM trend: 05/04/2022.    12-14 Month ICM trend:     [name], RN 05/05/2022 1:42 PM

## 2022-05-16 ENCOUNTER — Encounter: Payer: Self-pay | Admitting: Cardiology

## 2022-05-16 ENCOUNTER — Ambulatory Visit: Payer: PPO | Admitting: Cardiology

## 2022-05-16 VITALS — BP 100/64 | HR 58 | Ht 68.5 in | Wt 181.8 lb

## 2022-05-16 DIAGNOSIS — E782 Mixed hyperlipidemia: Secondary | ICD-10-CM

## 2022-05-16 DIAGNOSIS — I472 Ventricular tachycardia, unspecified: Secondary | ICD-10-CM | POA: Diagnosis not present

## 2022-05-16 DIAGNOSIS — I5022 Chronic systolic (congestive) heart failure: Secondary | ICD-10-CM

## 2022-05-16 DIAGNOSIS — I10 Essential (primary) hypertension: Secondary | ICD-10-CM

## 2022-05-16 DIAGNOSIS — I2510 Atherosclerotic heart disease of native coronary artery without angina pectoris: Secondary | ICD-10-CM | POA: Diagnosis not present

## 2022-05-16 NOTE — Progress Notes (Signed)
Clinical Summary Mr. Larkey is a 71 y.o.male seen today for follow up of the following medical problems.    1. CAD/ICM/Chronic systolic HF - history of prior CABG in 1999 as reported below - from notes has not been able to afford entresto and also intolerant to lasix in the past, has used HCTZ as diuretic. Previous fatigue on coreg, has tolerated toprol. Gynecomastia on aldactone.  - 03/2019 echo LVEF 30-35%, grade II dd -07/2014 cath as reported below - fluid well controlled with prn HCTZ   - ICD followed by Dr Rayann Heman. Normal device check 12/2020         11/2021 echo: LVEF 30-35%, grade I dd, normal RV - walking 2 miles daily without symptoms.  - no recent SOB/DOE, no chest pains.    2. History of VT - followed by Dr Rayann Heman - has been on sotalol, has ICD in place - amio allergy     - no recent palpitations - 03/2022 normal device check     3. 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, football and basketball     Past Medical History:  Diagnosis Date   Basal cell carcinoma 07/17/2017   nod-L cheek (CX35FU)   BPH (benign prostatic hyperplasia)    CAD (coronary artery disease)    widely patent LAD LIMA graft, widely patent PDA PLR SVG, occluded Diag 1 OM 1 SVG by cardiac CT 2007    CHF (congestive heart failure) ([place])    ECHO 03/2019: EF 30-35%   Fatty liver    GERD (gastroesophageal reflux disease)    Hyperlipidemia    IBS (irritable bowel syndrome)    ICD (implantable cardioverter-defibrillator) battery depletion    Ischemic cardiomyopathy    Prior inferior infarct EF 35% by ECHO 07/13/11 ; ECHO 03/2019: EF 30-35%   S/P CABG (coronary artery bypass graft)    CABG 06/1998 [place], [place] with LIMA to LAD, SVG to OM, SVG to PD-PL.   OM graft occluded 10/12/2005 by CTA    SCC (squamous cell carcinoma) 11/11/2016   in situ- R anti helix (CX35FU), well diff-R  upper crust of helix (CX35FU)   Sleep apnea    Ventricular tachycardia (HCC) 10/15   VT at 188 bpm- unstable requiring external defibrillation     Allergies  Allergen Reactions   Amiodarone     Thyroid issues   Carvedilol     Fatigue    Escitalopram Oxalate     unknown   Lasix [Furosemide]     Stomach upset and nausea   Niacin And Related     Flushing    Penicillins     Can take amoxicillin, and azithromycin but all others "upset my stomach"   Spironolactone     gynecomastia     Current Outpatient Medications  Medication Sig Dispense Refill   Ascorbic Acid (VITAMIN C) 1000 MG tablet Take 1,000 mg by mouth daily.     atorvastatin (LIPITOR) 80 MG tablet TAKE (1) TABLET BY MOUTH AT BEDTIME. 90 tablet 1   benzonatate (TESSALON) 200 MG capsule Take 1 capsule (200 mg total) by mouth 3 (three) times daily as needed for cough. 30 capsule 0   cetirizine (ZYRTEC) 10 MG tablet Take 10 mg by mouth daily.     clopidogrel (PLAVIX) 75 MG tablet Take 1  tablet (75 mg total) by mouth daily. 90 tablet 3   Coenzyme Q10 50 MG CAPS Take 1 capsule by mouth daily.     dicyclomine (BENTYL) 20 MG tablet Take 20 mg by mouth 3 (three) times daily as needed.     eplerenone (INSPRA) 25 MG tablet TAKE ONE TABLET BY MOUTH ONCE DAILY. 90 tablet 1   fenofibrate 160 MG tablet Take 160 mg by mouth daily.     fluticasone (FLONASE) 50 MCG/ACT nasal spray Place 2 sprays into both nostrils daily. (Patient taking differently: Place 2 sprays into both nostrils as needed.) 16 g 6   folic acid (FOLVITE) 259 MCG tablet Take 800 mcg by mouth daily.      hydrochlorothiazide (MICROZIDE) 12.5 MG capsule Take 1 capsule (12.5 mg total) by mouth daily as needed (weight gain 3lbs or more in 24 hours 5lbs in a week or swelling). 30 capsule 6   MAGNESIUM GLYCINATE PO Take 200 mg by mouth in the morning and at bedtime.     Melatonin 5 MG TABS Take 5 mg by mouth at bedtime.      metoprolol succinate (TOPROL-XL) 50 MG 24 hr  tablet Take 1 tablet (50 mg total) by mouth daily. 90 tablet 1   nitroGLYCERIN (NITROSTAT) 0.4 MG SL tablet Place 1 tablet (0.4 mg total) under the tongue as needed for chest pain. 25 tablet 3   Omega-3 Fatty Acids (FISH OIL) 1000 MG CAPS Take 1 capsule by mouth daily.     omeprazole (PRILOSEC) 20 MG capsule Take 20 mg by mouth in the morning and at bedtime.     OVER THE COUNTER MEDICATION 1,250 mg daily. Extra Virgin Building control surveyor. Gel capsule.     ramipril (ALTACE) 10 MG capsule Take 1 capsule (10 mg total) by mouth daily. 90 capsule 3   sildenafil (VIAGRA) 50 MG tablet Take 1 tablet (50 mg total) by mouth daily as needed for erectile dysfunction. 20 tablet 3   SOTALOL AF 80 MG TABS Take 1 tablet (80 mg total) by mouth 2 (two) times daily. 60 tablet 11   Vitamin D, Cholecalciferol, 25 MCG (1000 UT) CAPS Take by mouth.      Vitamin E 400 UNITS TABS Take 400 Units by mouth daily.      No current facility-administered medications for this visit.     Past Surgical History:  Procedure Laterality Date   APPENDECTOMY     COLONOSCOPY  06/11/2010   [facility]; mild nonspecific erythematous mucosa in the descending colon s/p biopsied, diverticulosis, internal hemorrhoids.  Pathology with focal active colitis, nonspecific, main consideration includes acute infectious colitis.  NSAID induced colitis and/or Crohn's disease also in the differential but less likely.   CORONARY ARTERY BYPASS GRAFT  1999   IMPLANTABLE CARDIOVERTER DEFIBRILLATOR IMPLANT N/A 07/31/2014   MDT Gwyneth Revels XT DR ICD implanted by Dr Rayann Heman for secondary treatent of VT   LEFT HEART CATHETERIZATION WITH CORONARY/GRAFT ANGIOGRAM N/A 07/30/2014   Procedure: LEFT HEART CATHETERIZATION WITH Beatrix Fetters;  Surgeon: Jacolyn Reedy, MD;  Location: [facility] CATH LAB;  Service: Cardiovascular;  Laterality: N/A;     Allergies  Allergen Reactions   Amiodarone     Thyroid issues   Carvedilol     Fatigue    Escitalopram Oxalate      unknown   Lasix [Furosemide]     Stomach upset and nausea   Niacin And Related     Flushing    Penicillins     Can  take amoxicillin, and azithromycin but all others "upset my stomach"   Spironolactone     gynecomastia      Family History  Problem Relation Age of Onset   Dementia Mother    Cerebral aneurysm Father    Pneumonia Brother    Stroke Maternal Aunt    Diabetes Neg Hx    Heart attack Neg Hx    Hypertension Neg Hx    Colon cancer Neg Hx      Social History Mr. Janczak reports that he has never smoked. He has never used smokeless tobacco. Mr. Covalt reports current alcohol use of about 2.0 standard drinks of alcohol per week.   Review of Systems CONSTITUTIONAL: No weight loss, fever, chills, weakness or fatigue.  HEENT: Eyes: No visual loss, blurred vision, double vision or yellow sclerae.No hearing loss, sneezing, congestion, runny nose or sore throat.  SKIN: No rash or itching.  CARDIOVASCULAR: per hpi RESPIRATORY: No shortness of breath, cough or sputum.  GASTROINTESTINAL: No anorexia, nausea, vomiting or diarrhea. No abdominal pain or blood.  GENITOURINARY: No burning on urination, no polyuria NEUROLOGICAL: No headache, dizziness, syncope, paralysis, ataxia, numbness or tingling in the extremities. No change in bowel or bladder control.  MUSCULOSKELETAL: No muscle, back pain, joint pain or stiffness.  LYMPHATICS: No enlarged nodes. No history of splenectomy.  PSYCHIATRIC: No history of depression or anxiety.  ENDOCRINOLOGIC: No reports of sweating, cold or heat intolerance. No polyuria or polydipsia.  Marland Kitchen   Physical Examination Today's Vitals   05/16/22 1247  BP: 100/64  Pulse: (!) 58  SpO2: 98%  Weight: 181 lb 12.8 oz (82.5 kg)  Height: 5' 8.5" (1.74 m)   Body mass index is 27.24 kg/m.  Gen: resting comfortably, no acute distress HEENT: no scleral icterus, pupils equal round and reactive, no palptable cervical adenopathy,  CV: RRR, no m/r/g no  jvd Resp: Clear to auscultation bilaterally GI: abdomen is soft, non-tender, non-distended, normal bowel sounds, no hepatosplenomegaly MSK: extremities are warm, no edema.  Skin: warm, no rash Neuro:  no focal deficits Psych: appropriate affect   Diagnostic Studies 03/2019 echo IMPRESSIONS     1. Stage 1: 1: Basal inferolateral segment, mid anterolateral segment,  mid inferolateral segment, and basal inferior segment are abnormal.   2. The left ventricle has moderate-severely reduced systolic function,  with an ejection fraction of 30-35%. The cavity size was severely dilated.  Left ventricular diastolic Doppler parameters are consistent with  pseudonormalization.   3. Left atrial size was mild-moderately dilated.   4. The mitral valve is grossly normal.   5. The tricuspid valve is grossly normal.   6. The aortic valve is tricuspid. Mild thickening of the aortic valve.  Mild calcification of the aortic valve.   7. There is mild dilatation of the aortic root measuring 37 mm.    07/2014 cath IMPRESSIONS:   Severe native two-vessel coronary artery disease with occlusion of the mid LAD, severe stenosis prior to a moderate-sized diagonal Darrel Baroni and moderate disease prior to the circumflex marginal Leonidus Rowand of the circumflex. Occluded right coronary artery distally with a severe proximal stenosis.  Patent saphenous vein graft to place the right coronary artery and patent mammary graft to LAD, previously occluded vein graft to the marginal system Potential sites of ischemia are the distal posterolateral Sheletha Bow of the right coronary artery and moderate size diagonal Vittorio Mohs  11/2021 echo 1. Left ventricular ejection fraction, by estimation, is 30 to 35%. The  left ventricle has moderately decreased  function. The left ventricle  demonstrates regional wall motion abnormalities (see scoring  diagram/findings for description). The left  ventricular internal cavity size was mildly to moderately  dilated. Left  ventricular diastolic parameters are consistent with Grade I diastolic  dysfunction (impaired relaxation).   2. Right ventricular systolic function is normal. The right ventricular  size is normal. There is normal pulmonary artery systolic pressure. The  estimated right ventricular systolic pressure is 52.7 mmHg.   3. Left atrial size was moderately dilated.   4. The mitral valve is grossly normal. Mild mitral valve regurgitation.   5. The aortic valve is tricuspid. Aortic valve regurgitation is not  visualized. No aortic stenosis is present. Aortic valve mean gradient  measures 3.5 mmHg.   6. The inferior vena cava is normal in size with greater than 50%  respiratory variability, suggesting right atrial pressure of 3 mmHg.    Assessment and Plan  1. CAD/ICM/Chronic systolic HF - medical therapy limited by cost, he had recentlyt checked on costs of entresto and jardiance with his insurance. He prefers to stay on current meds - no recent symptoms, he is euvolemic today. COntinue current meds   2. History of VT - followed by EP, on sotalol and has ICD -no recent issues, 03/2022 device check was normal - continue to follow with EP   3. HTN - at goal, continue current meds   4. Hyperlipidemia -he is at goal, continue current meds  F/u 6 months      Arnoldo Lenis, M.D.

## 2022-05-16 NOTE — Patient Instructions (Addendum)

## 2022-05-26 ENCOUNTER — Other Ambulatory Visit: Payer: Self-pay | Admitting: Cardiology

## 2022-06-06 ENCOUNTER — Ambulatory Visit (INDEPENDENT_AMBULATORY_CARE_PROVIDER_SITE_OTHER): Payer: Medicare Other

## 2022-06-06 DIAGNOSIS — Z95810 Presence of automatic (implantable) cardiac defibrillator: Secondary | ICD-10-CM | POA: Diagnosis not present

## 2022-06-06 DIAGNOSIS — I5022 Chronic systolic (congestive) heart failure: Secondary | ICD-10-CM

## 2022-06-08 NOTE — Progress Notes (Signed)
EPIC Encounter for ICM Monitoring  Patient Name: Paul [name] is a 71 y.o. male Date: 06/08/2022 Primary Care Physican: Chesley Noon, MD Primary Cardiologist: Hochrein Electrophysiologist: Allred 01/19/2022 Weight: 183 lbs 02/23/2022   Weight: 182 lbs 03/30/2022 Weight: 180 lbs 05/05/2022 Weight: 179 lbs   Time in AT/AF   0.0 hr/day (0.0%)           Spoke with patient and heart failure questions reviewed.  Pt asymptomatic for fluid accumulation.   He has had a very stressful 10 days due to daughter passed away.     Optivol thoracic impedance suggesting normal fluid levels.     Prescribed: Hydrochlorothiazide 12.5 mg Take 1 capsule (12.5 mg total) by mouth as needed (weight gain 3 lbs or more in 24 hours, 5 lbs in a week or swelling).     Labs: 09/30/2021 Creatinine 1.16, BUN 14, Potassium 4.7, Sodium 141, GFR 28 A complete set of results can be found in Results Review.   Recommendations:  No changes and encouraged to call if experiencing any fluid symptoms.   Follow-up plan: ICM clinic phone appointment on 07/18/2022.   91 day device clinic remote transmission 07/06/2022.     EP/Cardiology Office Visits:  11/21/2022 with Dr Harl Bowie.  03/31/2023 with Dr Rayann Heman.   Copy of ICM check sent to Dr. Rayann Heman.      3 month ICM trend: 06/06/2022.    12-14 Month ICM trend:     [name], RN 06/08/2022 4:43 PM

## 2022-06-28 ENCOUNTER — Other Ambulatory Visit: Payer: Self-pay | Admitting: Cardiology

## 2022-07-06 ENCOUNTER — Ambulatory Visit (INDEPENDENT_AMBULATORY_CARE_PROVIDER_SITE_OTHER): Payer: Medicare Other

## 2022-07-06 DIAGNOSIS — I255 Ischemic cardiomyopathy: Secondary | ICD-10-CM

## 2022-07-07 LAB — CUP PACEART REMOTE DEVICE CHECK
Battery Remaining Longevity: 25 mo
Battery Voltage: 2.93 V
Brady Statistic AP VP Percent: 0.01 %
Brady Statistic AP VS Percent: 8.19 %
Brady Statistic AS VP Percent: 0.05 %
Brady Statistic AS VS Percent: 91.75 %
Brady Statistic RA Percent Paced: 8.05 %
Brady Statistic RV Percent Paced: 0.06 %
Date Time Interrogation Session: 20230913033324
HighPow Impedance: 67 Ohm
Implantable Lead Implant Date: 20151008
Implantable Lead Implant Date: 20151008
Implantable Lead Location: 753859
Implantable Lead Location: 753860
Implantable Lead Model: 5076
Implantable Lead Model: 6935
Implantable Pulse Generator Implant Date: 20151008
Lead Channel Impedance Value: 285 Ohm
Lead Channel Impedance Value: 342 Ohm
Lead Channel Impedance Value: 418 Ohm
Lead Channel Pacing Threshold Amplitude: 1.25 V
Lead Channel Pacing Threshold Amplitude: 1.875 V
Lead Channel Pacing Threshold Pulse Width: 0.4 ms
Lead Channel Pacing Threshold Pulse Width: 0.4 ms
Lead Channel Sensing Intrinsic Amplitude: 1.375 mV
Lead Channel Sensing Intrinsic Amplitude: 1.375 mV
Lead Channel Sensing Intrinsic Amplitude: 8.125 mV
Lead Channel Sensing Intrinsic Amplitude: 8.125 mV
Lead Channel Setting Pacing Amplitude: 2.5 V
Lead Channel Setting Pacing Amplitude: 2.5 V
Lead Channel Setting Pacing Pulse Width: 0.8 ms
Lead Channel Setting Sensing Sensitivity: 0.3 mV

## 2022-07-18 ENCOUNTER — Ambulatory Visit (INDEPENDENT_AMBULATORY_CARE_PROVIDER_SITE_OTHER): Payer: Medicare Other

## 2022-07-18 DIAGNOSIS — I5022 Chronic systolic (congestive) heart failure: Secondary | ICD-10-CM

## 2022-07-18 DIAGNOSIS — Z95810 Presence of automatic (implantable) cardiac defibrillator: Secondary | ICD-10-CM

## 2022-07-19 ENCOUNTER — Encounter: Payer: Self-pay | Admitting: Internal Medicine

## 2022-07-19 ENCOUNTER — Telehealth: Payer: Self-pay

## 2022-07-19 NOTE — Telephone Encounter (Signed)
Call to patient as requested by voice mail message.  He reports he tested positive for COVID twice and wants to know how he can get the COVID medication.  He called PCP office yesterday and his PCP is out of the office and were supposed to call him back with recommendations but has not called back since yesterday.  He knows there is a time limit to take the medication.  Advised to go to [email or web address] and look at [employer] information and advised he has options to go to urgent care or virtual visits.  He will try virtual visit to see if they will prescribe him the medication.  Advised if unable to get virtual visit to go to Urgent care facility.

## 2022-07-19 NOTE — Progress Notes (Signed)
EPIC Encounter for ICM Monitoring  Patient Name: Paul [name] is a 71 y.o. male Date: 07/19/2022 Primary Care Physican: Chesley Noon, MD Primary Cardiologist: Hochrein Electrophysiologist: Mealor 01/19/2022 Weight: 183 lbs 02/23/2022   Weight: 182 lbs 03/30/2022 Weight: 180 lbs 05/05/2022 Weight: 179 lbs   Time in AT/AF   0.0 hr/day (0.0%)           Spoke with patient and heart failure questions reviewed.  Pt asymptomatic for fluid accumulation.   He was on vacation during decreased impedance.  He currently tested positive for covid and he attempted to contact PCP but no call back.  Directed him to [employer] site and locate [facility] resources for options of visits and treatment.    Optivol thoracic impedance suggesting normal fluid levels with exception of 9/16-9/20 suggesting possible fluid accumulation.     Prescribed: Hydrochlorothiazide 12.5 mg Take 1 capsule (12.5 mg total) by mouth as needed (weight gain 3 lbs or more in 24 hours, 5 lbs in a week or swelling).     Labs: 09/30/2021 Creatinine 1.16, BUN 14, Potassium 4.7, Sodium 141, GFR 28 A complete set of results can be found in Results Review.   Recommendations:  No changes and encouraged to call if experiencing any fluid symptoms.   Follow-up plan: ICM clinic phone appointment on 08/22/2022.  91 day device clinic remote transmission 10/05/2022.     EP/Cardiology Office Visits:  11/21/2022 with Dr Harl Bowie.  03/31/2023 with Dr Rayann Heman.   Copy of ICM check sent to Dr. Myles Gip.     3 month ICM trend: 07/19/2022.    12-14 Month ICM trend:     [name], RN 07/19/2022 9:21 AM

## 2022-07-21 NOTE — Progress Notes (Signed)
Remote ICD transmission.   

## 2022-07-25 ENCOUNTER — Encounter: Payer: Self-pay | Admitting: Cardiology

## 2022-07-25 ENCOUNTER — Other Ambulatory Visit: Payer: Self-pay | Admitting: *Deleted

## 2022-07-25 MED ORDER — NITROGLYCERIN 0.4 MG SL SUBL: 0.4000 mg | SUBLINGUAL_TABLET | SUBLINGUAL | 3 refills | Status: DC | PRN

## 2022-08-22 ENCOUNTER — Other Ambulatory Visit: Payer: Self-pay | Admitting: *Deleted

## 2022-08-22 ENCOUNTER — Ambulatory Visit (INDEPENDENT_AMBULATORY_CARE_PROVIDER_SITE_OTHER): Payer: Medicare Other

## 2022-08-22 ENCOUNTER — Encounter: Payer: Self-pay | Admitting: Cardiology

## 2022-08-22 DIAGNOSIS — I5022 Chronic systolic (congestive) heart failure: Secondary | ICD-10-CM

## 2022-08-22 DIAGNOSIS — Z95810 Presence of automatic (implantable) cardiac defibrillator: Secondary | ICD-10-CM

## 2022-08-22 MED ORDER — METOPROLOL SUCCINATE ER 50 MG PO TB24: 50.0000 mg | ORAL_TABLET | Freq: Every day | ORAL | 1 refills | Status: DC

## 2022-08-22 MED ORDER — CLOPIDOGREL BISULFATE 75 MG PO TABS: 75.0000 mg | ORAL_TABLET | Freq: Every day | ORAL | 1 refills | Status: DC

## 2022-08-24 NOTE — Progress Notes (Signed)
EPIC Encounter for ICM Monitoring  Patient Name: Paul [name] is a 71 y.o. male Date: 08/24/2022 Primary Care Physican: Chesley Noon, MD Primary Cardiologist: Hochrein Electrophysiologist: Mealor 08/24/2022 Weight: 179 lbs   Time in AT/AF   0.0 hr/day (0.0%)           Spoke with patient and heart failure questions reviewed.  Transmission results reviewed.  Pt asymptomatic for fluid accumulation.  Reports feeling well at this time.    Optivol thoracic impedance suggesting normal fluid levels.     Prescribed: Hydrochlorothiazide 12.5 mg Take 1 capsule (12.5 mg total) by mouth as needed (weight gain 3 lbs or more in 24 hours, 5 lbs in a week or swelling).     Labs: 09/30/2021 Creatinine 1.16, BUN 14, Potassium 4.7, Sodium 141, GFR 28 A complete set of results can be found in Results Review.   Recommendations:  No changes and encouraged to call if experiencing any fluid symptoms.   Follow-up plan: ICM clinic phone appointment on 09/26/2022.  91 day device clinic remote transmission 10/05/2022.     EP/Cardiology Office Visits:  11/21/2022 with Dr Harl Bowie.  03/31/2023 with Dr Rayann Heman.   Copy of ICM check sent to Dr. Myles Gip.      3 month ICM trend: 08/22/2022.    12-14 Month ICM trend:     [name], RN 08/24/2022 10:52 AM

## 2022-09-06 ENCOUNTER — Telehealth: Payer: Self-pay

## 2022-09-06 NOTE — Telephone Encounter (Signed)
Returned call to patient as request per voice mail message.  He received insurance letter stating Dr Rayann Heman is no longer in network as of 10/31.  Explained Dr Rayann Heman is no longer with [facility] as of 10/31 and Dr Myles Gip is replacing him in the [place] office.  Advised to call his insurance to confirm [facility] continues to be in network for him.  He appreciated the information and call back.

## 2022-09-26 ENCOUNTER — Ambulatory Visit (INDEPENDENT_AMBULATORY_CARE_PROVIDER_SITE_OTHER): Payer: Medicare Other

## 2022-09-26 DIAGNOSIS — I5022 Chronic systolic (congestive) heart failure: Secondary | ICD-10-CM

## 2022-09-26 DIAGNOSIS — Z95810 Presence of automatic (implantable) cardiac defibrillator: Secondary | ICD-10-CM | POA: Diagnosis not present

## 2022-09-28 NOTE — Progress Notes (Signed)
EPIC Encounter for ICM Monitoring  Patient Name: Paul [name] is a 71 y.o. male Date: 09/28/2022 Primary Care Physican: Chesley Noon, MD Primary Cardiologist: Hochrein Electrophysiologist: Mealor 08/24/2022 Weight: 179 lbs 09/29/2023 Weight: 179.6 lbs   Time in AT/AF   <0.1 hr/day (<0.1%)           Spoke with patient and heart failure questions reviewed.  Transmission results reviewed.  Pt asymptomatic for fluid accumulation.  Reports feeling well at this time.    Optivol thoracic impedance suggesting possible fluid accumulation starting 11/27 and returned close to baseline   Prescribed: Hydrochlorothiazide 12.5 mg Take 1 capsule (12.5 mg total) by mouth as needed (weight gain 3 lbs or more in 24 hours, 5 lbs in a week or swelling).     Labs: 09/30/2021 Creatinine 1.16, BUN 14, Potassium 4.7, Sodium 141, GFR 28 A complete set of results can be found in Results Review.   Recommendations:  No changes and encouraged to call if experiencing any fluid symptoms.   Follow-up plan: ICM clinic phone appointment on 11/07/2022.  91 day device clinic remote transmission 01/04/2023.     EP/Cardiology Office Visits:  11/21/2022 with Dr Harl Bowie.  02/24/2023 with Dr Myles Gip.   Copy of ICM check sent to Dr. Myles Gip.      3 month ICM trend: 09/26/2022.    12-14 Month ICM trend:     [name], RN 09/28/2022 4:13 PM

## 2022-10-01 ENCOUNTER — Other Ambulatory Visit: Payer: Self-pay | Admitting: Cardiology

## 2022-10-05 ENCOUNTER — Ambulatory Visit (INDEPENDENT_AMBULATORY_CARE_PROVIDER_SITE_OTHER): Payer: Medicare Other

## 2022-10-05 DIAGNOSIS — I255 Ischemic cardiomyopathy: Secondary | ICD-10-CM

## 2022-10-05 LAB — CUP PACEART REMOTE DEVICE CHECK
Battery Remaining Longevity: 21 mo
Battery Voltage: 2.92 V
Brady Statistic AP VP Percent: 0 %
Brady Statistic AP VS Percent: 8.41 %
Brady Statistic AS VP Percent: 0.05 %
Brady Statistic AS VS Percent: 91.53 %
Brady Statistic RA Percent Paced: 8.24 %
Brady Statistic RV Percent Paced: 0.06 %
Date Time Interrogation Session: 20231213063325
HighPow Impedance: 73 Ohm
Implantable Lead Connection Status: 753985
Implantable Lead Connection Status: 753985
Implantable Lead Implant Date: 20151008
Implantable Lead Implant Date: 20151008
Implantable Lead Location: 753859
Implantable Lead Location: 753860
Implantable Lead Model: 5076
Implantable Lead Model: 6935
Implantable Pulse Generator Implant Date: 20151008
Lead Channel Impedance Value: 285 Ohm
Lead Channel Impedance Value: 342 Ohm
Lead Channel Impedance Value: 456 Ohm
Lead Channel Pacing Threshold Amplitude: 1.125 V
Lead Channel Pacing Threshold Amplitude: 1.5 V
Lead Channel Pacing Threshold Pulse Width: 0.4 ms
Lead Channel Pacing Threshold Pulse Width: 0.4 ms
Lead Channel Sensing Intrinsic Amplitude: 1.5 mV
Lead Channel Sensing Intrinsic Amplitude: 1.5 mV
Lead Channel Sensing Intrinsic Amplitude: 7.25 mV
Lead Channel Sensing Intrinsic Amplitude: 7.25 mV
Lead Channel Setting Pacing Amplitude: 2.5 V
Lead Channel Setting Pacing Amplitude: 2.5 V
Lead Channel Setting Pacing Pulse Width: 0.8 ms
Lead Channel Setting Sensing Sensitivity: 0.3 mV
Zone Setting Status: 755011
Zone Setting Status: 755011

## 2022-11-01 NOTE — Progress Notes (Signed)
Remote ICD transmission.   

## 2022-11-07 ENCOUNTER — Ambulatory Visit (INDEPENDENT_AMBULATORY_CARE_PROVIDER_SITE_OTHER): Payer: Medicare Other

## 2022-11-07 DIAGNOSIS — I5022 Chronic systolic (congestive) heart failure: Secondary | ICD-10-CM | POA: Diagnosis not present

## 2022-11-07 DIAGNOSIS — Z95810 Presence of automatic (implantable) cardiac defibrillator: Secondary | ICD-10-CM

## 2022-11-10 NOTE — Progress Notes (Signed)
EPIC Encounter for ICM Monitoring  Patient Name: Paul [name] is a 72 y.o. male Date: 11/10/2022 Primary Care Physican: Chesley Noon, MD Primary Cardiologist: Hochrein Electrophysiologist: Mealor 08/24/2022 Weight: 179 lbs 09/29/2023 Weight: 179.6 lbs 11/10/2022 Weight: 177.1 lbs   Time in AT/AF   0.0 hr/day (0.0%)           Spoke with patient and heart failure questions reviewed.  Transmission results reviewed.  Pt asymptomatic for fluid accumulation.  He is recovering from stomach virus this week and prior to that he had the flu.     Optivol thoracic impedance suggesting normal fluid levels.   Prescribed: Hydrochlorothiazide 12.5 mg Take 1 capsule (12.5 mg total) by mouth as needed (weight gain 3 lbs or more in 24 hours, 5 lbs in a week or swelling).     Labs: 09/30/2021 Creatinine 1.16, BUN 14, Potassium 4.7, Sodium 141, GFR 28 A complete set of results can be found in Results Review.   Recommendations:  No changes and encouraged to call if experiencing any fluid symptoms.   Follow-up plan: ICM clinic phone appointment on 12/13/2022.  91 day device clinic remote transmission 01/04/2023.     EP/Cardiology Office Visits:  11/21/2022 with Dr Harl Bowie.  02/24/2023 with Dr Myles Gip.   Copy of ICM check sent to Dr. Myles Gip.     3 month ICM trend: 11/07/2022.    12-14 Month ICM trend:     [name], RN 11/10/2022 1:37 PM

## 2022-11-21 ENCOUNTER — Encounter: Payer: Self-pay | Admitting: Cardiology

## 2022-11-21 ENCOUNTER — Ambulatory Visit: Payer: Medicare Other | Attending: Cardiology | Admitting: Cardiology

## 2022-11-21 VITALS — BP 131/86 | HR 61 | Ht 68.0 in | Wt 185.2 lb

## 2022-11-21 DIAGNOSIS — I10 Essential (primary) hypertension: Secondary | ICD-10-CM

## 2022-11-21 DIAGNOSIS — I25810 Atherosclerosis of coronary artery bypass graft(s) without angina pectoris: Secondary | ICD-10-CM | POA: Diagnosis not present

## 2022-11-21 DIAGNOSIS — I5022 Chronic systolic (congestive) heart failure: Secondary | ICD-10-CM | POA: Diagnosis not present

## 2022-11-21 DIAGNOSIS — E782 Mixed hyperlipidemia: Secondary | ICD-10-CM

## 2022-11-21 DIAGNOSIS — I472 Ventricular tachycardia, unspecified: Secondary | ICD-10-CM | POA: Diagnosis not present

## 2022-11-21 NOTE — Patient Instructions (Signed)
Medication Instructions:  Continue all current medications.  Labwork: none  Testing/Procedures: none  Follow-Up: 6 months   Any Other Special Instructions Will Be Listed Below (If Applicable).  If you need a refill on your cardiac medications before your next appointment, please call your pharmacy.  

## 2022-11-21 NOTE — Progress Notes (Signed)
Clinical Summary Paul [name] is a 72 y.o.male seen today for follow up of the following medical problems.    1. CAD/ICM/Chronic systolic HF - history of prior CABG in 1999 as reported below - from notes has not been able to afford entresto and also intolerant to lasix in the past, has used HCTZ as diuretic. Previous fatigue on coreg, has tolerated toprol. Gynecomastia on aldactone.  - 03/2019 echo LVEF 30-35%, grade II dd -07/2014 cath as reported below - fluid well controlled with prn HCTZ   - ICD followed by Dr Rayann Heman. Normal device check 12/2020      11/2021 echo: LVEF 30-35%, grade I dd, normal RV - no SOB/DOE, no recent edema - no chest pains - compliant with meds   2. History of VT - followed by Dr Rayann Heman - has been on sotalol, has ICD in place - amio allergy   - 09/2022 normal device function - no palpitations.      3. 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, football and basketball Past Medical History:  Diagnosis Date   Basal cell carcinoma 07/17/2017   nod-L cheek (CX35FU)   BPH (benign prostatic hyperplasia)    CAD (coronary artery disease)    widely patent LAD LIMA graft, widely patent PDA PLR SVG, occluded Diag 1 OM 1 SVG by cardiac CT 2007    CHF (congestive heart failure) ([place])    ECHO 03/2019: EF 30-35%   Fatty liver    GERD (gastroesophageal reflux disease)    Hyperlipidemia    IBS (irritable bowel syndrome)    ICD (implantable cardioverter-defibrillator) battery depletion    Ischemic cardiomyopathy    Prior inferior infarct EF 35% by ECHO 07/13/11 ; ECHO 03/2019: EF 30-35%   S/P CABG (coronary artery bypass graft)    CABG 06/1998 [place], [place] with LIMA to LAD, SVG to OM, SVG to PD-PL.   OM graft occluded 10/12/2005 by CTA    SCC (squamous cell carcinoma) 11/11/2016   in situ- R anti helix (CX35FU),  well diff-R upper crust of helix (CX35FU)   Sleep apnea    Ventricular tachycardia (HCC) 10/15   VT at 188 bpm- unstable requiring external defibrillation     Allergies  Allergen Reactions   Amiodarone     Thyroid issues   Carvedilol     Fatigue    Escitalopram Oxalate     unknown   Lasix [Furosemide]     Stomach upset and nausea   Niacin And Related     Flushing    Penicillins     Can take amoxicillin, and azithromycin but all others "upset my stomach"   Spironolactone     gynecomastia     Current Outpatient Medications  Medication Sig Dispense Refill   Ascorbic Acid (VITAMIN C) 1000 MG tablet Take 1,000 mg by mouth daily.     atorvastatin (LIPITOR) 80 MG tablet TAKE (1) TABLET BY MOUTH AT BEDTIME. 90 tablet 1   benzonatate (TESSALON) 200 MG capsule Take 1 capsule (200 mg total) by mouth 3 (three) times daily as needed for cough. 30 capsule 0   cetirizine (ZYRTEC) 10 MG tablet Take 10 mg by mouth daily.     clopidogrel (PLAVIX) 75 MG tablet  Take 1 tablet (75 mg total) by mouth daily. 90 tablet 1   Coenzyme Q10 50 MG CAPS Take 1 capsule by mouth daily.     dicyclomine (BENTYL) 20 MG tablet Take 20 mg by mouth 3 (three) times daily as needed.     eplerenone (INSPRA) 25 MG tablet TAKE ONE TABLET BY MOUTH ONCE DAILY. 90 tablet 1   fenofibrate 160 MG tablet Take 160 mg by mouth daily.     fluticasone (FLONASE) 50 MCG/ACT nasal spray Place 2 sprays into both nostrils daily. (Patient taking differently: Place 2 sprays into both nostrils as needed.) 16 g 6   folic acid (FOLVITE) 268 MCG tablet Take 800 mcg by mouth daily.      hydrochlorothiazide (MICROZIDE) 12.5 MG capsule TAKE (1) CAPSULE BY MOUTH ONCE DAILY AS NEEDED *FOR WEIGHT GAIN 3 LBS OR MORE IN 24 HOURS / 5 LBS A WEEK / SWELLING* 30 capsule 5   MAGNESIUM GLYCINATE PO Take 200 mg by mouth in the morning and at bedtime.     Melatonin 5 MG TABS Take 5 mg by mouth at bedtime.      metoprolol succinate (TOPROL-XL) 50 MG 24 hr  tablet Take 1 tablet (50 mg total) by mouth daily. 90 tablet 1   nitroGLYCERIN (NITROSTAT) 0.4 MG SL tablet Place 1 tablet (0.4 mg total) under the tongue every 5 (five) minutes x 3 doses as needed for chest pain (if no relief after 2nd dose, proceed to ED or call 911). 25 tablet 3   Omega-3 Fatty Acids (FISH OIL) 1000 MG CAPS Take 1 capsule by mouth daily.     omeprazole (PRILOSEC) 20 MG capsule Take 20 mg by mouth in the morning and at bedtime.     OVER THE COUNTER MEDICATION 1,250 mg daily. Extra Virgin Building control surveyor. Gel capsule.     ramipril (ALTACE) 10 MG capsule Take 1 capsule (10 mg total) by mouth daily. 90 capsule 3   sildenafil (VIAGRA) 50 MG tablet Take 1 tablet (50 mg total) by mouth daily as needed for erectile dysfunction. 20 tablet 3   SOTALOL AF 80 MG TABS Take 1 tablet (80 mg total) by mouth 2 (two) times daily. 60 tablet 11   Vitamin D, Cholecalciferol, 25 MCG (1000 UT) CAPS Take by mouth.      Vitamin E 400 UNITS TABS Take 400 Units by mouth daily.      No current facility-administered medications for this visit.     Past Surgical History:  Procedure Laterality Date   APPENDECTOMY     COLONOSCOPY  06/11/2010   [facility]; mild nonspecific erythematous mucosa in the descending colon s/p biopsied, diverticulosis, internal hemorrhoids.  Pathology with focal active colitis, nonspecific, main consideration includes acute infectious colitis.  NSAID induced colitis and/or Crohn's disease also in the differential but less likely.   CORONARY ARTERY BYPASS GRAFT  1999   IMPLANTABLE CARDIOVERTER DEFIBRILLATOR IMPLANT N/A 07/31/2014   MDT Gwyneth Revels XT DR ICD implanted by Dr Rayann Heman for secondary treatent of VT   LEFT HEART CATHETERIZATION WITH CORONARY/GRAFT ANGIOGRAM N/A 07/30/2014   Procedure: LEFT HEART CATHETERIZATION WITH Beatrix Fetters;  Surgeon: Jacolyn Reedy, MD;  Location: [facility] CATH LAB;  Service: Cardiovascular;  Laterality: N/A;     Allergies  Allergen Reactions    Amiodarone     Thyroid issues   Carvedilol     Fatigue    Escitalopram Oxalate     unknown   Lasix [Furosemide]     Stomach  upset and nausea   Niacin And Related     Flushing    Penicillins     Can take amoxicillin, and azithromycin but all others "upset my stomach"   Spironolactone     gynecomastia      Family History  Problem Relation Age of Onset   Dementia Mother    Cerebral aneurysm Father    Pneumonia Brother    Stroke Maternal Aunt    Diabetes Neg Hx    Heart attack Neg Hx    Hypertension Neg Hx    Colon cancer Neg Hx      Social History Mr. Mcgue reports that he has never smoked. He has never used smokeless tobacco. Mr. Deeb reports current alcohol use of about 2.0 standard drinks of alcohol per week.   Review of Systems CONSTITUTIONAL: No weight loss, fever, chills, weakness or fatigue.  HEENT: Eyes: No visual loss, blurred vision, double vision or yellow sclerae.No hearing loss, sneezing, congestion, runny nose or sore throat.  SKIN: No rash or itching.  CARDIOVASCULAR: per hpi RESPIRATORY: No shortness of breath, cough or sputum.  GASTROINTESTINAL: No anorexia, nausea, vomiting or diarrhea. No abdominal pain or blood.  GENITOURINARY: No burning on urination, no polyuria NEUROLOGICAL: No headache, dizziness, syncope, paralysis, ataxia, numbness or tingling in the extremities. No change in bowel or bladder control.  MUSCULOSKELETAL: No muscle, back pain, joint pain or stiffness.  LYMPHATICS: No enlarged nodes. No history of splenectomy.  PSYCHIATRIC: No history of depression or anxiety.  ENDOCRINOLOGIC: No reports of sweating, cold or heat intolerance. No polyuria or polydipsia.  Marland Kitchen   Physical Examination Today's Vitals   11/21/22 1458  BP: 131/86  Pulse: 61  SpO2: 96%  Weight: 185 lb 3.2 oz (84 kg)  Height: '5\' 8"'$  (1.727 m)   Body mass index is 28.16 kg/m.  Gen: resting comfortably, no acute distress HEENT: no scleral icterus, pupils equal  round and reactive, no palptable cervical adenopathy,  CV: RRR, no m/r/g no jvd Resp: Clear to auscultation bilaterally GI: abdomen is soft, non-tender, non-distended, normal bowel sounds, no hepatosplenomegaly MSK: extremities are warm, no edema.  Skin: warm, no rash Neuro:  no focal deficits Psych: appropriate affect   Diagnostic Studies  03/2019 echo IMPRESSIONS     1. Stage 1: 1: Basal inferolateral segment, mid anterolateral segment,  mid inferolateral segment, and basal inferior segment are abnormal.   2. The left ventricle has moderate-severely reduced systolic function,  with an ejection fraction of 30-35%. The cavity size was severely dilated.  Left ventricular diastolic Doppler parameters are consistent with  pseudonormalization.   3. Left atrial size was mild-moderately dilated.   4. The mitral valve is grossly normal.   5. The tricuspid valve is grossly normal.   6. The aortic valve is tricuspid. Mild thickening of the aortic valve.  Mild calcification of the aortic valve.   7. There is mild dilatation of the aortic root measuring 37 mm.    07/2014 cath IMPRESSIONS:   Severe native two-vessel coronary artery disease with occlusion of the mid LAD, severe stenosis prior to a moderate-sized diagonal Alitzel Cookson and moderate disease prior to the circumflex marginal Bellatrix Devonshire of the circumflex. Occluded right coronary artery distally with a severe proximal stenosis.  Patent saphenous vein graft to place the right coronary artery and patent mammary graft to LAD, previously occluded vein graft to the marginal system Potential sites of ischemia are the distal posterolateral Glennie Bose of the right coronary artery and moderate size diagonal  Dani Danis   11/2021 echo 1. Left ventricular ejection fraction, by estimation, is 30 to 35%. The  left ventricle has moderately decreased function. The left ventricle  demonstrates regional wall motion abnormalities (see scoring  diagram/findings for  description). The left  ventricular internal cavity size was mildly to moderately dilated. Left  ventricular diastolic parameters are consistent with Grade I diastolic  dysfunction (impaired relaxation).   2. Right ventricular systolic function is normal. The right ventricular  size is normal. There is normal pulmonary artery systolic pressure. The  estimated right ventricular systolic pressure is 76.1 mmHg.   3. Left atrial size was moderately dilated.   4. The mitral valve is grossly normal. Mild mitral valve regurgitation.   5. The aortic valve is tricuspid. Aortic valve regurgitation is not  visualized. No aortic stenosis is present. Aortic valve mean gradient  measures 3.5 mmHg.   6. The inferior vena cava is normal in size with greater than 50%  respiratory variability, suggesting right atrial pressure of 3 mmHg.       Assessment and Plan   1. CAD/ICM/Chronic systolic HF - medical therapy limited by cost, he had recentlyt checked on costs of entresto and jardiance with his insurance. He prefers to stay on current meds - no recent symptoms, we will continue current meds   2. History of VT - followed by EP, on sotalol and has ICD -normal recent device check, no symptoms - continue to monitor   3. HTN - at goal, continue current meds   4. Hyperlipidemia -at goal by recent labs, continue current meds     Arnoldo Lenis, M.D

## 2022-12-13 ENCOUNTER — Ambulatory Visit: Payer: Medicare Other

## 2022-12-13 DIAGNOSIS — I5022 Chronic systolic (congestive) heart failure: Secondary | ICD-10-CM | POA: Diagnosis not present

## 2022-12-13 DIAGNOSIS — Z95810 Presence of automatic (implantable) cardiac defibrillator: Secondary | ICD-10-CM

## 2022-12-14 ENCOUNTER — Telehealth: Payer: Self-pay

## 2022-12-14 NOTE — Progress Notes (Signed)
EPIC Encounter for ICM Monitoring  Patient Name: Paul [name] is a 72 y.o. male Date: 12/14/2022 Primary Care Physican: Chesley Noon, MD Electrophysiologist: Mealor 08/24/2022 Weight: 179 lbs 09/29/2023 Weight: 179.6 lbs 11/10/2022 Weight: 177.1 lbs 12/14/2022 Weight: 178 lbs   Time in AT/AF   0.0 hr/day (0.0%)           Spoke with patient and heart failure questions reviewed.  Transmission results reviewed.  Pt asymptomatic for fluid accumulation.  Reports feeling well at this time and voices no complaints.   He ate foods that were not low salt over the weekend.    Optivol thoracic impedance suggesting possible fluid accumulation starting 2/17 also from 1/19-2/1.    Prescribed: Hydrochlorothiazide 12.5 mg Take 1 capsule (12.5 mg total) by mouth as needed (weight gain 3 lbs or more in 24 hours, 5 lbs in a week or swelling).     Labs: 09/30/2021 Creatinine 1.16, BUN 14, Potassium 4.7, Sodium 141, GFR 28 A complete set of results can be found in Results Review.   Recommendations:  No changes and encouraged to call if experiencing any fluid symptoms.   Follow-up plan: ICM clinic phone appointment on 01/16/2023.  91 day device clinic remote transmission 01/04/2023.     EP/Cardiology Office Visits:   02/24/2023 with Dr Myles Gip.   Copy of ICM check sent to Dr. Myles Gip.   3 month ICM trend: 12/14/2022.    12-14 Month ICM trend:     [name], RN 12/14/2022 2:34 PM

## 2022-12-14 NOTE — Telephone Encounter (Signed)
Pt left a voicemail stating he was having difficulty with his monitor. I called him and he was able to send missed ICM transmission. I told the patient I will let Margarita Grizzle know. Transmission received. 12/14/2022.

## 2023-01-04 ENCOUNTER — Ambulatory Visit (INDEPENDENT_AMBULATORY_CARE_PROVIDER_SITE_OTHER): Payer: Medicare Other

## 2023-01-04 DIAGNOSIS — I255 Ischemic cardiomyopathy: Secondary | ICD-10-CM | POA: Diagnosis not present

## 2023-01-06 LAB — CUP PACEART REMOTE DEVICE CHECK
Battery Remaining Longevity: 18 mo
Battery Voltage: 2.92 V
Brady Statistic AP VP Percent: 0.01 %
Brady Statistic AP VS Percent: 4.21 %
Brady Statistic AS VP Percent: 0.07 %
Brady Statistic AS VS Percent: 95.71 %
Brady Statistic RA Percent Paced: 4.19 %
Brady Statistic RV Percent Paced: 0.08 %
Date Time Interrogation Session: 20240313044223
HighPow Impedance: 71 Ohm
Implantable Lead Connection Status: 753985
Implantable Lead Connection Status: 753985
Implantable Lead Implant Date: 20151008
Implantable Lead Implant Date: 20151008
Implantable Lead Location: 753859
Implantable Lead Location: 753860
Implantable Lead Model: 5076
Implantable Lead Model: 6935
Implantable Pulse Generator Implant Date: 20151008
Lead Channel Impedance Value: 285 Ohm
Lead Channel Impedance Value: 342 Ohm
Lead Channel Impedance Value: 418 Ohm
Lead Channel Pacing Threshold Amplitude: 1.125 V
Lead Channel Pacing Threshold Amplitude: 1.875 V
Lead Channel Pacing Threshold Pulse Width: 0.4 ms
Lead Channel Pacing Threshold Pulse Width: 0.4 ms
Lead Channel Sensing Intrinsic Amplitude: 1.625 mV
Lead Channel Sensing Intrinsic Amplitude: 1.625 mV
Lead Channel Sensing Intrinsic Amplitude: 7.5 mV
Lead Channel Sensing Intrinsic Amplitude: 7.5 mV
Lead Channel Setting Pacing Amplitude: 2.25 V
Lead Channel Setting Pacing Amplitude: 2.5 V
Lead Channel Setting Pacing Pulse Width: 0.8 ms
Lead Channel Setting Sensing Sensitivity: 0.3 mV
Zone Setting Status: 755011
Zone Setting Status: 755011

## 2023-01-16 ENCOUNTER — Ambulatory Visit: Payer: Medicare Other | Attending: Cardiovascular Disease

## 2023-01-16 DIAGNOSIS — I5022 Chronic systolic (congestive) heart failure: Secondary | ICD-10-CM | POA: Diagnosis not present

## 2023-01-18 NOTE — Progress Notes (Signed)
EPIC Encounter for ICM Monitoring  Patient Name: Paul [name] is a 72 y.o. male Date: 01/18/2023 Primary Care Physican: Chesley Noon, MD Primary Cardiologist: Branch Electrophysiologist: Mealor 08/24/2022 Weight: 179 lbs 09/29/2023 Weight: 179.6 lbs 11/10/2022 Weight: 177.1 lbs 12/14/2022 Weight: 178 lbs 01/18/2023 Weight: 180.8   Since 04-Jan-2023 VT-NS (>4 beats, >167 bpm) 176 Time in AT/AF   0.0 hr/day (0.0%)           Spoke with patient and heart failure questions reviewed.  Transmission results reviewed.  Pt asymptomatic for fluid accumulation.  Reports feeling well at this time and voices no complaints.      Optivol thoracic impedance suggesting normal fluid levels.    Prescribed: Hydrochlorothiazide 12.5 mg Take 1 capsule (12.5 mg total) by mouth as needed (weight gain 3 lbs or more in 24 hours, 5 lbs in a week or swelling).     Labs: 09/30/2021 Creatinine 1.16, BUN 14, Potassium 4.7, Sodium 141, GFR 28 A complete set of results can be found in Results Review.   Recommendations:  No changes and encouraged to call if experiencing any fluid symptoms.   Follow-up plan: ICM clinic phone appointment on 02/20/2023.  91 day device clinic remote transmission 04/05/2023.     EP/Cardiology Office Visits:   02/24/2023 with Dr Myles Gip.   Copy of ICM check sent to Dr. Myles Gip.   3 month ICM trend: 01/16/2023.    12-14 Month ICM trend:     [name], RN 01/18/2023 4:54 PM

## 2023-01-23 ENCOUNTER — Other Ambulatory Visit: Payer: Self-pay | Admitting: Cardiology

## 2023-02-08 NOTE — Progress Notes (Signed)
Remote ICD transmission.   

## 2023-02-13 ENCOUNTER — Other Ambulatory Visit: Payer: Self-pay

## 2023-02-13 ENCOUNTER — Encounter: Payer: Self-pay | Admitting: Cardiology

## 2023-02-13 MED ORDER — METOPROLOL SUCCINATE ER 50 MG PO TB24: 50.0000 mg | ORAL_TABLET | Freq: Every day | ORAL | 1 refills | Status: DC

## 2023-02-13 MED ORDER — CLOPIDOGREL BISULFATE 75 MG PO TABS: 75.0000 mg | ORAL_TABLET | Freq: Every day | ORAL | 1 refills | Status: DC

## 2023-02-20 ENCOUNTER — Ambulatory Visit: Payer: Medicare Other | Attending: Cardiovascular Disease

## 2023-02-20 DIAGNOSIS — Z95810 Presence of automatic (implantable) cardiac defibrillator: Secondary | ICD-10-CM

## 2023-02-20 DIAGNOSIS — I5022 Chronic systolic (congestive) heart failure: Secondary | ICD-10-CM | POA: Diagnosis not present

## 2023-02-22 NOTE — Progress Notes (Signed)
EPIC Encounter for ICM Monitoring  Patient Name: Paul [name] is a 72 y.o. male Date: 02/22/2023 Primary Care Physican: Eartha Inch, MD Primary Cardiologist: Branch Electrophysiologist: Mealor 08/24/2022 Weight: 179 lbs 09/29/2023 Weight: 179.6 lbs 11/10/2022 Weight: 177.1 lbs 12/14/2022 Weight: 178 lbs 01/18/2023 Weight: 180.8 lbs 02/22/2023   Weight: 181 lbs   Since 16-Jan-2023 VT-NS (>4 beats, >167 bpm)    11 Time in AT/AF   0.0 hr/day (0.0%)           Spoke with patient and heart failure questions reviewed.  Transmission results reviewed.  Pt asymptomatic for fluid accumulation.  Reports feeling well at this time and voices no complaints.      Optivol thoracic impedance suggesting normal fluid levels.    Prescribed: Hydrochlorothiazide 12.5 mg Take 1 capsule (12.5 mg total) by mouth as needed (weight gain 3 lbs or more in 24 hours, 5 lbs in a week or swelling).     Labs: 09/30/2021 Creatinine 1.16, BUN 14, Potassium 4.7, Sodium 141, GFR 28 A complete set of results can be found in Results Review.   Recommendations:  No changes and encouraged to call if experiencing any fluid symptoms.   Follow-up plan: ICM clinic phone appointment on 03/27/2023.  91 day device clinic remote transmission 04/05/2023.     EP/Cardiology Office Visits:   02/24/2023 with Dr Nelly Laurence.   Copy of ICM check sent to Dr. Nelly Laurence.   3 month ICM trend: 02/20/2023.    12-14 Month ICM trend:     Karie Soda, RN 02/22/2023 2:56 PM

## 2023-02-24 ENCOUNTER — Encounter: Payer: Self-pay | Admitting: Cardiovascular Disease

## 2023-02-24 ENCOUNTER — Ambulatory Visit: Payer: Medicare Other | Attending: Internal Medicine | Admitting: Cardiovascular Disease

## 2023-02-24 VITALS — BP 146/80 | HR 58 | Ht 68.0 in | Wt 186.6 lb

## 2023-02-24 DIAGNOSIS — I255 Ischemic cardiomyopathy: Secondary | ICD-10-CM | POA: Diagnosis not present

## 2023-02-24 DIAGNOSIS — Z95810 Presence of automatic (implantable) cardiac defibrillator: Secondary | ICD-10-CM | POA: Diagnosis not present

## 2023-02-24 DIAGNOSIS — I472 Ventricular tachycardia, unspecified: Secondary | ICD-10-CM | POA: Diagnosis not present

## 2023-02-24 LAB — CUP PACEART INCLINIC DEVICE CHECK
Battery Remaining Longevity: 19 mo
Battery Voltage: 2.91 V
Brady Statistic AP VP Percent: 0.01 %
Brady Statistic AP VS Percent: 6.41 %
Brady Statistic AS VP Percent: 0.06 %
Brady Statistic AS VS Percent: 93.53 %
Brady Statistic RA Percent Paced: 6.31 %
Brady Statistic RV Percent Paced: 0.07 %
Date Time Interrogation Session: 20240503132816
HighPow Impedance: 78 Ohm
Implantable Lead Connection Status: 753985
Implantable Lead Connection Status: 753985
Implantable Lead Implant Date: 20151008
Implantable Lead Implant Date: 20151008
Implantable Lead Location: 753859
Implantable Lead Location: 753860
Implantable Lead Model: 5076
Implantable Lead Model: 6935
Implantable Pulse Generator Implant Date: 20151008
Lead Channel Impedance Value: 361 Ohm
Lead Channel Impedance Value: 399 Ohm
Lead Channel Impedance Value: 456 Ohm
Lead Channel Pacing Threshold Amplitude: 1.375 V
Lead Channel Pacing Threshold Amplitude: 1.625 V
Lead Channel Pacing Threshold Pulse Width: 0.4 ms
Lead Channel Pacing Threshold Pulse Width: 0.4 ms
Lead Channel Sensing Intrinsic Amplitude: 1.75 mV
Lead Channel Sensing Intrinsic Amplitude: 2 mV
Lead Channel Sensing Intrinsic Amplitude: 8 mV
Lead Channel Sensing Intrinsic Amplitude: 8.125 mV
Lead Channel Setting Pacing Amplitude: 2.5 V
Lead Channel Setting Pacing Amplitude: 2.75 V
Lead Channel Setting Pacing Pulse Width: 0.8 ms
Lead Channel Setting Sensing Sensitivity: 0.3 mV
Zone Setting Status: 755011
Zone Setting Status: 755011

## 2023-02-24 NOTE — Progress Notes (Signed)
PCP: Eartha Inch, MD Primary Cardiologist: Dr Wyline Mood Primary EP: Dr Nelly Laurence  Paul [name] is a 72 y.o. male who presents today for routine electrophysiology followup.  Since last being seen in our clinic, the patient reports doing very well.  Today, he denies symptoms of palpitations, chest pain, shortness of breath,  lower extremity edema, dizziness, presyncope, syncope, or ICD shocks.  The patient is otherwise without complaint today.   Past Medical History:  Diagnosis Date   Basal cell carcinoma 07/17/2017   nod-L cheek (CX35FU)   BPH (benign prostatic hyperplasia)    CAD (coronary artery disease)    widely patent LAD LIMA graft, widely patent PDA PLR SVG, occluded Diag 1 OM 1 SVG by cardiac CT 2007    CHF (congestive heart failure) (H[facility])    ECHO 03/2019: EF 30-35%   Fatty liver    GERD (gastroesophageal reflux disease)    Hyperlipidemia    IBS (irritable bowel syndrome)    ICD (implantable cardioverter-defibrillator) battery depletion    Ischemic cardiomyopathy    Prior inferior infarct EF 35% by ECHO 07/13/11 ; ECHO 03/2019: EF 30-35%   S/P CABG (coronary artery bypass graft)    CABG 06/1998 [place], [place] with LIMA to LAD, SVG to OM, SVG to PD-PL.   OM graft occluded 10/12/2005 by CTA    SCC (squamous cell carcinoma) 11/11/2016   in situ- R anti helix (CX35FU), well diff-R upper crust of helix (CX35FU)   Sleep apnea    Ventricular tachycardia (HCC) 10/15   VT at 188 bpm- unstable requiring external defibrillation   Past Surgical History:  Procedure Laterality Date   APPENDECTOMY     COLONOSCOPY  06/11/2010   [place]; mild nonspecific erythematous mucosa in the descending colon s/p biopsied, diverticulosis, internal hemorrhoids.  Pathology with focal active colitis, nonspecific, main consideration includes acute infectious colitis.  NSAID induced colitis and/or Crohn's disease also in the differential but less likely.   CORONARY ARTERY BYPASS GRAFT  1999    IMPLANTABLE CARDIOVERTER DEFIBRILLATOR IMPLANT N/A 07/31/2014   MDT Melvyn Neth XT DR ICD implanted by Dr Johney Frame for secondary treatent of VT   LEFT HEART CATHETERIZATION WITH CORONARY/GRAFT ANGIOGRAM N/A 07/30/2014   Procedure: LEFT HEART CATHETERIZATION WITH Isabel Caprice;  Surgeon: Othella Boyer, MD;  Location: [facility] CATH LAB;  Service: Cardiovascular;  Laterality: N/A;    ROS- all systems are reviewed and negative except as per HPI above  Current Outpatient Medications  Medication Sig Dispense Refill   Ascorbic Acid (VITAMIN C) 1000 MG tablet Take 1,000 mg by mouth daily.     atorvastatin (LIPITOR) 80 MG tablet TAKE (1) TABLET BY MOUTH AT BEDTIME. 90 tablet 1   benzonatate (TESSALON) 200 MG capsule Take 1 capsule (200 mg total) by mouth 3 (three) times daily as needed for cough. 30 capsule 0   cetirizine (ZYRTEC) 10 MG tablet Take 10 mg by mouth daily.     clopidogrel (PLAVIX) 75 MG tablet Take 1 tablet (75 mg total) by mouth daily. 90 tablet 1   Coenzyme Q10 50 MG CAPS Take 1 capsule by mouth daily.     dicyclomine (BENTYL) 20 MG tablet Take 20 mg by mouth 3 (three) times daily as needed.     eplerenone (INSPRA) 25 MG tablet TAKE ONE TABLET BY MOUTH ONCE DAILY. 90 tablet 1   fenofibrate 160 MG tablet Take 160 mg by mouth daily.     fluticasone (FLONASE) 50 MCG/ACT nasal spray Place 2 sprays into  both nostrils daily. (Patient taking differently: Place 2 sprays into both nostrils as needed.) 16 g 6   folic acid (FOLVITE) 800 MCG tablet Take 800 mcg by mouth daily.      hydrochlorothiazide (MICROZIDE) 12.5 MG capsule TAKE (1) CAPSULE BY MOUTH ONCE DAILY AS NEEDED *FOR WEIGHT GAIN 3 LBS OR MORE IN 24 HOURS / 5 LBS A WEEK / SWELLING* 30 capsule 5   MAGNESIUM GLYCINATE PO Take 200 mg by mouth in the morning and at bedtime.     Melatonin 5 MG TABS Take 5 mg by mouth at bedtime.      metoprolol succinate (TOPROL-XL) 50 MG 24 hr tablet Take 1 tablet (50 mg total) by mouth daily. 90 tablet 1    nitroGLYCERIN (NITROSTAT) 0.4 MG SL tablet Place 1 tablet (0.4 mg total) under the tongue every 5 (five) minutes x 3 doses as needed for chest pain (if no relief after 2nd dose, proceed to ED or call 911). 25 tablet 3   Omega-3 Fatty Acids (FISH OIL) 1000 MG CAPS Take 1 capsule by mouth daily.     OVER THE COUNTER MEDICATION 1,250 mg daily. Extra Virgin IT trainer. Gel capsule.     pantoprazole (PROTONIX) 20 MG tablet Take 20 mg by mouth daily.     ramipril (ALTACE) 10 MG capsule TAKE 1 CAPSULE BY MOUTH ONCE DAILY. 90 capsule 1   sildenafil (VIAGRA) 50 MG tablet Take 1 tablet (50 mg total) by mouth daily as needed for erectile dysfunction. 20 tablet 3   sotalol (BETAPACE) 80 MG tablet Take 80 mg by mouth 2 (two) times daily.     Vitamin D, Cholecalciferol, 25 MCG (1000 UT) CAPS Take by mouth.      Vitamin E 400 UNITS TABS Take 400 Units by mouth daily.      No current facility-administered medications for this visit.    Physical Exam: Vitals:   02/24/23 1302 02/24/23 1308  BP: (!) 146/80 (!) 146/80  Pulse:  (!) 58  SpO2:  97%  Weight: 186 lb 9.6 oz (84.6 kg)   Height: 5\' 8"  (1.727 m)     Gen: Appears comfortable, well-nourished CV: RRR, no dependent edema The device site is normal -- no tenderness, edema, drainage, redness, threatened erosion. Pulm: breathing easily   ICD interrogation- reviewed in detail today,  See PACEART report  ekg tracing ordered today is personally reviewed and shows sinus with atrial-paced beats, PVCs, Qtc 457 msec  Wt Readings from Last 3 Encounters:  02/24/23 186 lb 9.6 oz (84.6 kg)  11/21/22 185 lb 3.2 oz (84 kg)  05/16/22 181 lb 12.8 oz (82.5 kg)    Assessment and Plan:  1.  Chronic systolic dysfunction/ CAD/ ischemic CM euvolemic today Stable on an appropriate medical regimen Normal ICD function See Pace Art report No changes today he is not device dependant today followed in ICM device clinic  2. VT Controlled with sotalol Qt is  stable Labs from 1/21 reviewed. Renal function normal  3. OSA Uses dental appliance  Risks, benefits and potential toxicities for medications prescribed and/or refilled reviewed with patient today.   Return in 12 months Follow with Dr Wyline Mood as scheduled  Maurice Small, MD 02/24/2023 1:40 PM

## 2023-02-24 NOTE — Patient Instructions (Signed)
Medication Instructions:  Continue all current medications.  Labwork: none  Testing/Procedures: none  Follow-Up: 1 year - Dr.  Mealor   Any Other Special Instructions Will Be Listed Below (If Applicable).   If you need a refill on your cardiac medications before your next appointment, please call your pharmacy.; 

## 2023-03-27 ENCOUNTER — Ambulatory Visit: Payer: Medicare Other | Attending: Cardiovascular Disease

## 2023-03-27 DIAGNOSIS — I5022 Chronic systolic (congestive) heart failure: Secondary | ICD-10-CM | POA: Diagnosis not present

## 2023-03-27 DIAGNOSIS — Z95810 Presence of automatic (implantable) cardiac defibrillator: Secondary | ICD-10-CM | POA: Diagnosis not present

## 2023-03-29 NOTE — Progress Notes (Signed)
EPIC Encounter for ICM Monitoring  Patient Name: Paul [name] is a 72 y.o. male Date: 03/29/2023 Primary Care Physican: Eartha Inch, MD Primary Cardiologist: Branch Electrophysiologist: Mealor 08/24/2022 Weight: 179 lbs 09/29/2023 Weight: 179.6 lbs 11/10/2022 Weight: 177.1 lbs 12/14/2022 Weight: 178 lbs 01/18/2023 Weight: 180.8 lbs 02/22/2023   Weight: 181 lbs 03/29/2023   Weight: 182 lbs   Time in AT/AF   0.0 hr/day (0.0%)           Spoke with patient and heart failure questions reviewed.  Transmission results reviewed.  Pt asymptomatic for fluid accumulation.  Reports feeling well at this time and voices no complaints.      Optivol thoracic impedance suggesting normal fluid levels with the exception 5/27-5/31.    Prescribed: Hydrochlorothiazide 12.5 mg Take 1 capsule (12.5 mg total) by mouth as needed (weight gain 3 lbs or more in 24 hours, 5 lbs in a week or swelling).     Labs: 09/30/2021 Creatinine 1.16, BUN 14, Potassium 4.7, Sodium 141, GFR 28 A complete set of results can be found in Results Review.   Recommendations:  No changes and encouraged to call if experiencing any fluid symptoms.   Follow-up plan: ICM clinic phone appointment on 05/01/2023.  91 day device clinic remote transmission 07/06/2023.     EP/Cardiology Office Visits:   02/23/2024 with Dr Nelly Laurence.   Copy of ICM check sent to Dr. Nelly Laurence.   3 month ICM trend: 03/27/2023.    12-14 Month ICM trend:     Karie Soda, RN 03/29/2023 3:20 PM

## 2023-03-31 ENCOUNTER — Encounter: Payer: PPO | Admitting: Internal Medicine

## 2023-04-05 ENCOUNTER — Ambulatory Visit (INDEPENDENT_AMBULATORY_CARE_PROVIDER_SITE_OTHER): Payer: Medicare Other

## 2023-04-05 DIAGNOSIS — I472 Ventricular tachycardia, unspecified: Secondary | ICD-10-CM | POA: Diagnosis not present

## 2023-04-05 LAB — CUP PACEART REMOTE DEVICE CHECK
Battery Remaining Longevity: 17 mo
Battery Voltage: 2.91 V
Brady Statistic AP VP Percent: 0 %
Brady Statistic AP VS Percent: 3.79 %
Brady Statistic AS VP Percent: 0.06 %
Brady Statistic AS VS Percent: 96.14 %
Brady Statistic RA Percent Paced: 3.79 %
Brady Statistic RV Percent Paced: 0.07 %
Date Time Interrogation Session: 20240612012304
HighPow Impedance: 77 Ohm
Implantable Lead Connection Status: 753985
Implantable Lead Connection Status: 753985
Implantable Lead Implant Date: 20151008
Implantable Lead Implant Date: 20151008
Implantable Lead Location: 753859
Implantable Lead Location: 753860
Implantable Lead Model: 5076
Implantable Lead Model: 6935
Implantable Pulse Generator Implant Date: 20151008
Lead Channel Impedance Value: 285 Ohm
Lead Channel Impedance Value: 361 Ohm
Lead Channel Impedance Value: 456 Ohm
Lead Channel Pacing Threshold Amplitude: 1.125 V
Lead Channel Pacing Threshold Amplitude: 2.25 V
Lead Channel Pacing Threshold Pulse Width: 0.4 ms
Lead Channel Pacing Threshold Pulse Width: 0.4 ms
Lead Channel Sensing Intrinsic Amplitude: 1.5 mV
Lead Channel Sensing Intrinsic Amplitude: 1.5 mV
Lead Channel Sensing Intrinsic Amplitude: 6.75 mV
Lead Channel Sensing Intrinsic Amplitude: 6.75 mV
Lead Channel Setting Pacing Amplitude: 2.5 V
Lead Channel Setting Pacing Amplitude: 2.5 V
Lead Channel Setting Pacing Pulse Width: 0.8 ms
Lead Channel Setting Sensing Sensitivity: 0.3 mV
Zone Setting Status: 755011
Zone Setting Status: 755011

## 2023-04-10 ENCOUNTER — Other Ambulatory Visit: Payer: Self-pay | Admitting: *Deleted

## 2023-04-10 ENCOUNTER — Encounter: Payer: Self-pay | Admitting: Cardiovascular Disease

## 2023-04-10 DIAGNOSIS — I472 Ventricular tachycardia, unspecified: Secondary | ICD-10-CM

## 2023-04-10 DIAGNOSIS — Z95810 Presence of automatic (implantable) cardiac defibrillator: Secondary | ICD-10-CM

## 2023-04-10 MED ORDER — SOTALOL HCL 80 MG PO TABS: 80.0000 mg | ORAL_TABLET | Freq: Two times a day (BID) | ORAL | 6 refills | Status: DC

## 2023-04-21 ENCOUNTER — Telehealth: Payer: Self-pay

## 2023-04-21 NOTE — Telephone Encounter (Signed)
Spoke with patient and previously discussed at [facility] call regarding changing to the EP office in [place] and agreed to see Dr Ladona Ridgel which will be more convenient.  His wife Gavin Pound would also like to change to the [place] office to see Dr Ladona Ridgel.  Advised will make arrangements to have he and his wife switched to Dr Ladona Ridgel in [place].  He and his wife would both like 6 month appointments which would be around [facility] 2024.

## 2023-05-01 ENCOUNTER — Ambulatory Visit: Payer: Medicare Other | Attending: Cardiovascular Disease

## 2023-05-01 DIAGNOSIS — Z95810 Presence of automatic (implantable) cardiac defibrillator: Secondary | ICD-10-CM | POA: Diagnosis not present

## 2023-05-01 DIAGNOSIS — I5022 Chronic systolic (congestive) heart failure: Secondary | ICD-10-CM | POA: Diagnosis not present

## 2023-05-02 NOTE — Progress Notes (Signed)
Remote ICD transmission.   

## 2023-05-03 NOTE — Progress Notes (Signed)
EPIC Encounter for ICM Monitoring  Patient Name: Paul [name] is a 72 y.o. male Date: 05/03/2023 Primary Care Physican: Eartha Inch, MD Primary Cardiologist: Branch Electrophysiologist: Mealor 01/18/2023 Weight: 180.8 lbs 02/22/2023   Weight: 181 lbs 03/29/2023   Weight: 182 lbs 05/03/2023 Weight: 181.2 lbs   Time in AT/AF   0.0 hr/day (0.0%)           Spoke with patient and heart failure questions reviewed.  Transmission results reviewed.  Pt asymptomatic for fluid accumulation.  Reports feeling well at this time and voices no complaints.      Optivol thoracic impedance suggesting normal fluid levels within the last month.    Prescribed: Hydrochlorothiazide 12.5 mg Take 1 capsule (12.5 mg total) by mouth as needed (weight gain 3 lbs or more in 24 hours, 5 lbs in a week or swelling).     Labs: 09/30/2021 Creatinine 1.16, BUN 14, Potassium 4.7, Sodium 141, GFR 28 A complete set of results can be found in Results Review.   Recommendations:  No changes and encouraged to call if experiencing any fluid symptoms.   Follow-up plan: ICM clinic phone appointment on 06/05/2023.  91 day device clinic remote transmission 07/05/2023.     EP/Cardiology Office Visits:   02/23/2024 with Dr Nelly Laurence.   Copy of ICM check sent to Dr. Nelly Laurence.   3 month ICM trend: 05/01/2023.    12-14 Month ICM trend:     Karie Soda, RN 05/03/2023 3:02 PM

## 2023-05-04 NOTE — Telephone Encounter (Signed)
Patient is requesting provider switch from Dr. Nelly Laurence in [place] to Dr. Ladona Ridgel in [place].  Please advise if this is acceptable

## 2023-05-08 ENCOUNTER — Emergency Department (HOSPITAL_COMMUNITY)
Admission: EM | Admit: 2023-05-08 | Discharge: 2023-05-08 | Disposition: A | Discharge status: Home / Self Care | Payer: Medicare Other | Attending: Emergency Medicine | Admitting: Emergency Medicine

## 2023-05-08 ENCOUNTER — Emergency Department (HOSPITAL_COMMUNITY): Payer: Medicare Other

## 2023-05-08 ENCOUNTER — Other Ambulatory Visit: Payer: Self-pay

## 2023-05-08 ENCOUNTER — Encounter (HOSPITAL_COMMUNITY): Payer: Self-pay | Admitting: Emergency Medicine

## 2023-05-08 DIAGNOSIS — Z79899 Other long term (current) drug therapy: Secondary | ICD-10-CM | POA: Insufficient documentation

## 2023-05-08 DIAGNOSIS — I509 Heart failure, unspecified: Secondary | ICD-10-CM | POA: Insufficient documentation

## 2023-05-08 DIAGNOSIS — Z95810 Presence of automatic (implantable) cardiac defibrillator: Secondary | ICD-10-CM | POA: Diagnosis not present

## 2023-05-08 DIAGNOSIS — R079 Chest pain, unspecified: Secondary | ICD-10-CM | POA: Diagnosis not present

## 2023-05-08 DIAGNOSIS — Z7902 Long term (current) use of antithrombotics/antiplatelets: Secondary | ICD-10-CM | POA: Diagnosis not present

## 2023-05-08 DIAGNOSIS — Z951 Presence of aortocoronary bypass graft: Secondary | ICD-10-CM | POA: Insufficient documentation

## 2023-05-08 LAB — CBC
HCT: 41.3 % (ref 39.0–52.0)
Hemoglobin: 14.4 g/dL (ref 13.0–17.0)
MCH: 34 pg (ref 26.0–34.0)
MCHC: 34.9 g/dL (ref 30.0–36.0)
MCV: 97.4 fL (ref 80.0–100.0)
Platelets: 152 10*3/uL (ref 150–400)
RBC: 4.24 MIL/uL (ref 4.22–5.81)
RDW: 13.2 % (ref 11.5–15.5)
WBC: 5.6 10*3/uL (ref 4.0–10.5)
nRBC: 0 % (ref 0.0–0.2)

## 2023-05-08 LAB — PROTIME-INR
INR: 1.1 (ref 0.8–1.2)
Prothrombin Time: 14.5 seconds (ref 11.4–15.2)

## 2023-05-08 LAB — BASIC METABOLIC PANEL
Anion gap: 11 (ref 5–15)
BUN: 15 mg/dL (ref 8–23)
CO2: 23 mmol/L (ref 22–32)
Calcium: 9.2 mg/dL (ref 8.9–10.3)
Chloride: 97 mmol/L — ABNORMAL LOW (ref 98–111)
Creatinine, Ser: 1.29 mg/dL — ABNORMAL HIGH (ref 0.61–1.24)
GFR, Estimated: 59 mL/min — ABNORMAL LOW (ref >60)
Glucose, Bld: 87 mg/dL (ref 70–99)
Potassium: 4.7 mmol/L (ref 3.5–5.1)
Sodium: 131 mmol/L — ABNORMAL LOW (ref 135–145)

## 2023-05-08 LAB — TROPONIN I (HIGH SENSITIVITY)
Troponin I (High Sensitivity): 8 ng/L (ref <18)
Troponin I (High Sensitivity): 10 ng/L (ref <18)

## 2023-05-08 NOTE — ED Triage Notes (Signed)
Pt via POV c/o chest pain x 1 hour PTA. He states the pain has been sharp under his left breast area, intermittent, not reproducible, rated 8/10 briefly and then subsides. Pain does not radiate and pt denies n/v/diaphoresis/dizziness/SOB. Pt took 1g tylenol PTA. 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

## 2023-05-08 NOTE — ED Provider Notes (Signed)
[place] EMERGENCY DEPARTMENT AT [facility] Provider Note   CSN: [id number] Arrival date & time: 05/08/23  1629     History {Add pertinent medical, surgical, social history, OB history to HPI:1} Chief Complaint  Patient presents with   Chest Pain    Nuno Brubacher is a 72 y.o. male.  He has a history of cardiac disease AICD CHF CABG and V. tach.  He has been doing well from cardiac standpoint.  Today he said he was plunging the toilet vigorously.  A little while later while driving he experienced a sharp left-sided chest stabbing pain.  It was brief.  He has had it before.  It happened 3-4 more times a few minutes apart.  It has not happened since.  He said this is very different from his cardiac events which were crushing and will chest pain radiating to his arm with shortness of breath.  He is also had an episode of his AICD firing and he said this felt very different than that.  He feels back to baseline now but just wanted to be sure with his prior cardiac history.  No shortness of breath diaphoresis nausea vomiting.  No numbness or weakness.  The history is provided by the patient.  Chest Pain Pain location:  L chest Pain quality: stabbing   Pain radiates to:  Does not radiate Pain severity:  Moderate Onset quality:  Sudden Timing:  Sporadic Progression:  Resolved Chronicity:  Recurrent Relieved by:  None tried Worsened by:  Nothing Ineffective treatments:  None tried Associated symptoms: no abdominal pain, no cough, no diaphoresis, no dizziness, no nausea, no shortness of breath and no vomiting   Risk factors: coronary artery disease        Home Medications Prior to Admission medications   Medication Sig Start Date End Date Taking? Authorizing Provider  Ascorbic Acid (VITAMIN C) 1000 MG tablet Take 1,000 mg by mouth daily.    [provider]  atorvastatin (LIPITOR) 80 MG tablet TAKE (1) TABLET BY MOUTH AT BEDTIME. 02/26/21   Branch, Dorothe Pea, MD   benzonatate (TESSALON) 200 MG capsule Take 1 capsule (200 mg total) by mouth 3 (three) times daily as needed for cough. 03/04/22   Domenick Gong, MD  cetirizine (ZYRTEC) 10 MG tablet Take 10 mg by mouth daily.    [provider]  clopidogrel (PLAVIX) 75 MG tablet Take 1 tablet (75 mg total) by mouth daily. 02/13/23   Antoine Poche, MD  Coenzyme Q10 50 MG CAPS Take 1 capsule by mouth daily.    [provider]  dicyclomine (BENTYL) 20 MG tablet Take 20 mg by mouth 3 (three) times daily as needed. 06/29/21   [provider]  eplerenone (INSPRA) 25 MG tablet TAKE ONE TABLET BY MOUTH ONCE DAILY. 10/03/22   Antoine Poche, MD  fenofibrate 160 MG tablet Take 160 mg by mouth daily.    [provider]  fluticasone (FLONASE) 50 MCG/ACT nasal spray Place 2 sprays into both nostrils daily. Patient taking differently: Place 2 sprays into both nostrils as needed. 10/21/16   Sheliah Hatch, MD  folic acid (FOLVITE) 800 MCG tablet Take 800 mcg by mouth daily.     [provider]  hydrochlorothiazide (MICROZIDE) 12.5 MG capsule TAKE (1) CAPSULE BY MOUTH ONCE DAILY AS NEEDED *FOR WEIGHT GAIN 3 LBS OR MORE IN 24 HOURS / 5 LBS A WEEK / SWELLING* 05/26/22   Branch, Dorothe Pea, MD  MAGNESIUM GLYCINATE PO  Take 200 mg by mouth in the morning and at bedtime.    [provider]  Melatonin 5 MG TABS Take 5 mg by mouth at bedtime.     [provider]  metoprolol succinate (TOPROL-XL) 50 MG 24 hr tablet Take 1 tablet (50 mg total) by mouth daily. 02/13/23   Antoine Poche, MD  nitroGLYCERIN (NITROSTAT) 0.4 MG SL tablet Place 1 tablet (0.4 mg total) under the tongue every 5 (five) minutes x 3 doses as needed for chest pain (if no relief after 2nd dose, proceed to ED or call 911). 07/25/22   Antoine Poche, MD  Omega-3 Fatty Acids (FISH OIL) 1000 MG CAPS Take 1 capsule by mouth daily.    [provider]  OVER THE COUNTER MEDICATION 1,250 mg  daily. Extra Virgin IT trainer. Gel capsule.    [provider]  pantoprazole (PROTONIX) 20 MG tablet Take 20 mg by mouth daily.    [provider]  ramipril (ALTACE) 10 MG capsule TAKE 1 CAPSULE BY MOUTH ONCE DAILY. 01/23/23   Antoine Poche, MD  sildenafil (VIAGRA) 50 MG tablet Take 1 tablet (50 mg total) by mouth daily as needed for erectile dysfunction. 08/31/21   Antoine Poche, MD  sotalol (BETAPACE) 80 MG tablet Take 1 tablet (80 mg total) by mouth 2 (two) times daily. 04/10/23   Mealor, Roberts Gaudy, MD  Vitamin D, Cholecalciferol, 25 MCG (1000 UT) CAPS Take by mouth.     [provider]  Vitamin E 400 UNITS TABS Take 400 Units by mouth daily.     [provider]      Allergies    Amiodarone, Carvedilol, Escitalopram oxalate, Lasix [furosemide], Niacin and related, Penicillins, and Spironolactone    Review of Systems   Review of Systems  Constitutional:  Negative for diaphoresis.  Respiratory:  Negative for cough and shortness of breath.   Cardiovascular:  Positive for chest pain.  Gastrointestinal:  Negative for abdominal pain, nausea and vomiting.  Neurological:  Negative for dizziness.    Physical Exam Updated Vital Signs BP (!) 151/81 (BP Location: Right Arm)   Pulse 62   Temp 98.4 F (36.9 C) (Oral)   Resp 14   Ht 5\' 8"  (1.727 m)   Wt 82.1 kg   SpO2 99%   BMI 27.52 kg/m  Physical Exam Vitals and nursing note reviewed.  Constitutional:      General: He is not in acute distress.    Appearance: He is well-developed.  HENT:     Head: Normocephalic and atraumatic.  Eyes:     Conjunctiva/sclera: Conjunctivae normal.  Cardiovascular:     Rate and Rhythm: Normal rate and regular rhythm.     Heart sounds: Normal heart sounds. No murmur heard.    Comments: AICD left upper chest without any surrounding erythema or tenderness. Pulmonary:     Effort: Pulmonary effort is normal. No respiratory distress.     Breath sounds: Normal  breath sounds.  Chest:     Chest wall: No tenderness.  Abdominal:     Palpations: Abdomen is soft.     Tenderness: There is no abdominal tenderness.  Musculoskeletal:        General: No swelling.     Cervical back: Neck supple.     Right lower leg: No tenderness. No edema.     Left lower leg: No tenderness. No edema.  Skin:    General: Skin is warm and dry.  Capillary Refill: Capillary refill takes less than 2 seconds.  Neurological:     General: No focal deficit present.     Mental Status: He is alert.     ED Results / Procedures / Treatments   Labs (all labs ordered are listed, but only abnormal results are displayed) Labs Reviewed  BASIC METABOLIC PANEL - Abnormal; Notable for the following components:      Result Value   Sodium 131 (*)    Chloride 97 (*)    Creatinine, Ser 1.29 (*)    GFR, Estimated 59 (*)    All other components within normal limits  CBC  PROTIME-INR  TROPONIN I (HIGH SENSITIVITY)  TROPONIN I (HIGH SENSITIVITY)    EKG EKG Interpretation Date/Time:  Monday May 08 2023 16:40:10 EDT Ventricular Rate:  60 PR Interval:  180 QRS Duration:  138 QT Interval:  464 QTC Calculation: 464 R Axis:   87  Text Interpretation: Normal sinus rhythm Non-specific intra-ventricular conduction block Abnormal ECG When compared with ECG of 13-Nov-2015 23:35, No significant change since last tracing Confirmed by Meridee Score [phone]) on 05/08/2023 5:09:39 PM  Radiology DG Chest 2 View  Result Date: 05/08/2023 CLINICAL DATA:  Chest pain. EXAM: CHEST - 2 VIEW COMPARISON:  Chest x-ray May 12, 23. FINDINGS: Borderline enlargement of the cardiac silhouette, similar. CABG and median sternotomy. Left subclavian approach cardiac rhythm maintenance device. No consolidation. No visible pleural effusions or pneumothorax. IMPRESSION: No active cardiopulmonary disease. Electronically Signed   By: Feliberto Harts M.D.   On: 05/08/2023 17:00    Procedures Procedures   {Document cardiac monitor, telemetry assessment procedure when appropriate:1}  Medications Ordered in ED Medications - No data to display  ED Course/ Medical Decision Making/ A&P   {   Click here for ABCD2, HEART and other calculatorsREFRESH Note before signing :1}                          Medical Decision Making Amount and/or Complexity of Data Reviewed Labs: ordered. Radiology: ordered.   This patient complains of ***; this involves an extensive number of treatment Options and is a complaint that carries with it a high risk of complications and morbidity. The differential includes ***  I ordered, reviewed and interpreted labs, which included *** I ordered medication *** and reviewed PMP when indicated. I ordered imaging studies which included *** and I independently    visualized and interpreted imaging which showed *** Additional history obtained from *** Previous records obtained and reviewed *** I consulted *** and discussed lab and imaging findings and discussed disposition.  Cardiac monitoring reviewed, *** Social determinants considered, *** Critical Interventions: ***  After the interventions stated above, I reevaluated the patient and found *** Admission and further testing considered, ***   {Document critical care time when appropriate:1} {Document review of labs and clinical decision tools ie heart score, Chads2Vasc2 etc:1}  {Document your independent review of radiology images, and any outside records:1} {Document your discussion with family members, caretakers, and with consultants:1} {Document social determinants of health affecting pt's care:1} {Document your decision making why or why not admission, treatments were needed:1} Final Clinical Impression(s) / ED Diagnoses Final diagnoses:  None    Rx / DC Orders ED Discharge Orders     None

## 2023-05-08 NOTE — Discharge Instructions (Signed)
You were seen in the emergency department for multiple episodes of from sharp stabbing left-sided chest pain.  You had blood work EKG and chest x-ray that did not show an obvious explanation for your symptoms.  There was no evidence of heart attack.  Please follow-up with your cardiologist and continue your regular medications.  Return to the emergency department if any worsening or concerning symptoms.

## 2023-05-31 ENCOUNTER — Encounter: Payer: Self-pay | Admitting: Cardiology

## 2023-05-31 ENCOUNTER — Ambulatory Visit: Payer: Medicare Other | Attending: Cardiology | Admitting: Cardiology

## 2023-05-31 VITALS — BP 122/84 | HR 60 | Ht 68.0 in | Wt 185.2 lb

## 2023-05-31 DIAGNOSIS — I25810 Atherosclerosis of coronary artery bypass graft(s) without angina pectoris: Secondary | ICD-10-CM

## 2023-05-31 DIAGNOSIS — I5022 Chronic systolic (congestive) heart failure: Secondary | ICD-10-CM | POA: Diagnosis not present

## 2023-05-31 DIAGNOSIS — I255 Ischemic cardiomyopathy: Secondary | ICD-10-CM | POA: Diagnosis not present

## 2023-05-31 DIAGNOSIS — I472 Ventricular tachycardia, unspecified: Secondary | ICD-10-CM

## 2023-05-31 DIAGNOSIS — E782 Mixed hyperlipidemia: Secondary | ICD-10-CM

## 2023-05-31 DIAGNOSIS — I10 Essential (primary) hypertension: Secondary | ICD-10-CM

## 2023-05-31 MED ORDER — SILDENAFIL CITRATE 50 MG PO TABS: 50.0000 mg | ORAL_TABLET | Freq: Every day | ORAL | 3 refills | Status: AC | PRN

## 2023-05-31 NOTE — Patient Instructions (Addendum)
Medication Instructions:   Viagra refilled today  Continue all other current medications.   Labwork:  none  Testing/Procedures:  none  Follow-Up:  3 months   Any Other Special Instructions Will Be Listed Below (If Applicable).  Will send message to device clinic   If you need a refill on your cardiac medications before your next appointment, please call your pharmacy.

## 2023-05-31 NOTE — Progress Notes (Signed)
Clinical Summary Mr. Josephsen is a 72 y.o.male seen today for follow up of the following medical problems.    1. CAD/ICM/Chronic systolic HF - history of prior CABG in 1999 as reported below - from notes has not been able to afford entresto and also intolerant to lasix in the past, has used HCTZ as diuretic. Previous fatigue on coreg, has tolerated toprol. Gynecomastia on aldactone now on eplerenone - 03/2019 echo LVEF 30-35%, grade II dd -07/2014 cath as reported below - fluid well controlled with prn HCTZ   - ICD followed by EP  11/2021 echo: LVEF 30-35%, grade I dd, normal RV   - no SOB/DOE, no recent edema - compliant with meds   2. History of VT - followed by Dr Johney Frame - has been on sotalol, has ICD in place - amio allergy    - has had a feelling of "jolt' in his left breast - can be multiple at times in a row. Lasts just a few seconds.      3. 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, football and basketball Past Medical History:  Diagnosis Date   Basal cell carcinoma 07/17/2017   nod-L cheek (CX35FU)   BPH (benign prostatic hyperplasia)    CAD (coronary artery disease)    widely patent LAD LIMA graft, widely patent PDA PLR SVG, occluded Diag 1 OM 1 SVG by cardiac CT 2007    CHF (congestive heart failure) (HCC)    ECHO 03/2019: EF 30-35%   Fatty liver    GERD (gastroesophageal reflux disease)    Hyperlipidemia    IBS (irritable bowel syndrome)    ICD (implantable cardioverter-defibrillator) battery depletion    Ischemic cardiomyopathy    Prior inferior infarct EF 35% by ECHO 07/13/11 ; ECHO 03/2019: EF 30-35%   S/P CABG (coronary artery bypass graft)    CABG 06/1998 [place], [place] with LIMA to LAD, SVG to OM, SVG to PD-PL.   OM graft occluded 10/12/2005 by CTA    SCC (squamous  cell carcinoma) 11/11/2016   in situ- R anti helix (CX35FU), well diff-R upper crust of helix (CX35FU)   Sleep apnea    Ventricular tachycardia (HCC) 10/15   VT at 188 bpm- unstable requiring external defibrillation     Allergies  Allergen Reactions   Amiodarone     Thyroid issues   Carvedilol     Fatigue    Escitalopram Oxalate     unknown   Lasix [Furosemide]     Stomach upset and nausea   Niacin And Related     Flushing    Penicillins     Can take amoxicillin, and azithromycin but all others "upset my stomach"   Spironolactone     gynecomastia     Current Outpatient Medications  Medication Sig Dispense Refill   Ascorbic Acid (VITAMIN C) 1000 MG tablet Take 1,000 mg by mouth daily.     atorvastatin (LIPITOR) 80 MG tablet TAKE (1) TABLET BY MOUTH AT BEDTIME. 90 tablet 1   benzonatate (TESSALON) 200 MG capsule Take 1 capsule (200 mg total) by mouth 3 (three) times daily as needed for cough. 30 capsule 0   cetirizine (ZYRTEC)  10 MG tablet Take 10 mg by mouth daily.     clopidogrel (PLAVIX) 75 MG tablet Take 1 tablet (75 mg total) by mouth daily. 90 tablet 1   Coenzyme Q10 50 MG CAPS Take 1 capsule by mouth daily.     dicyclomine (BENTYL) 20 MG tablet Take 20 mg by mouth 3 (three) times daily as needed.     eplerenone (INSPRA) 25 MG tablet TAKE ONE TABLET BY MOUTH ONCE DAILY. 90 tablet 1   fenofibrate 160 MG tablet Take 160 mg by mouth daily.     fluticasone (FLONASE) 50 MCG/ACT nasal spray Place 2 sprays into both nostrils daily. (Patient taking differently: Place 2 sprays into both nostrils as needed.) 16 g 6   folic acid (FOLVITE) 800 MCG tablet Take 800 mcg by mouth daily.      hydrochlorothiazide (MICROZIDE) 12.5 MG capsule TAKE (1) CAPSULE BY MOUTH ONCE DAILY AS NEEDED *FOR WEIGHT GAIN 3 LBS OR MORE IN 24 HOURS / 5 LBS A WEEK / SWELLING* 30 capsule 5   MAGNESIUM GLYCINATE PO Take 200 mg by mouth in the morning and at bedtime.     Melatonin 5 MG TABS Take 5 mg by mouth  at bedtime.      metoprolol succinate (TOPROL-XL) 50 MG 24 hr tablet Take 1 tablet (50 mg total) by mouth daily. 90 tablet 1   nitroGLYCERIN (NITROSTAT) 0.4 MG SL tablet Place 1 tablet (0.4 mg total) under the tongue every 5 (five) minutes x 3 doses as needed for chest pain (if no relief after 2nd dose, proceed to ED or call 911). 25 tablet 3   Omega-3 Fatty Acids (FISH OIL) 1000 MG CAPS Take 1 capsule by mouth daily.     OVER THE COUNTER MEDICATION 1,250 mg daily. Extra Virgin IT trainer. Gel capsule.     pantoprazole (PROTONIX) 20 MG tablet Take 20 mg by mouth daily.     ramipril (ALTACE) 10 MG capsule TAKE 1 CAPSULE BY MOUTH ONCE DAILY. 90 capsule 1   sildenafil (VIAGRA) 50 MG tablet Take 1 tablet (50 mg total) by mouth daily as needed for erectile dysfunction. 20 tablet 3   sotalol (BETAPACE) 80 MG tablet Take 1 tablet (80 mg total) by mouth 2 (two) times daily. 60 tablet 6   Vitamin D, Cholecalciferol, 25 MCG (1000 UT) CAPS Take by mouth.      Vitamin E 400 UNITS TABS Take 400 Units by mouth daily.      No current facility-administered medications for this visit.     Past Surgical History:  Procedure Laterality Date   APPENDECTOMY     COLONOSCOPY  06/11/2010   [facility] Gainer; mild nonspecific erythematous mucosa in the descending colon s/p biopsied, diverticulosis, internal hemorrhoids.  Pathology with focal active colitis, nonspecific, main consideration includes acute infectious colitis.  NSAID induced colitis and/or Crohn's disease also in the differential but less likely.   CORONARY ARTERY BYPASS GRAFT  1999   IMPLANTABLE CARDIOVERTER DEFIBRILLATOR IMPLANT N/A 07/31/2014   MDT Melvyn Neth XT DR ICD implanted by Dr Johney Frame for secondary treatent of VT   LEFT HEART CATHETERIZATION WITH CORONARY/GRAFT ANGIOGRAM N/A 07/30/2014   Procedure: LEFT HEART CATHETERIZATION WITH Isabel Caprice;  Surgeon: Othella Boyer, MD;  Location: [facility] CATH LAB;  Service: Cardiovascular;  Laterality: N/A;      Allergies  Allergen Reactions   Amiodarone     Thyroid issues   Carvedilol     Fatigue    Escitalopram Oxalate  unknown   Lasix [Furosemide]     Stomach upset and nausea   Niacin And Related     Flushing    Penicillins     Can take amoxicillin, and azithromycin but all others "upset my stomach"   Spironolactone     gynecomastia      Family History  Problem Relation Age of Onset   Dementia Mother    Cerebral aneurysm Father    Pneumonia Brother    Stroke Maternal Aunt    Diabetes Neg Hx    Heart attack Neg Hx    Hypertension Neg Hx    Colon cancer Neg Hx      Social History Mr. Hensarling reports that he has never smoked. He has never used smokeless tobacco. Mr. Bartkiewicz reports current alcohol use of about 2.0 standard drinks of alcohol per week.   Review of Systems CONSTITUTIONAL: No weight loss, fever, chills, weakness or fatigue.  HEENT: Eyes: No visual loss, blurred vision, double vision or yellow sclerae.No hearing loss, sneezing, congestion, runny nose or sore throat.  SKIN: No rash or itching.  CARDIOVASCULAR: per hpi RESPIRATORY: No shortness of breath, cough or sputum.  GASTROINTESTINAL: No anorexia, nausea, vomiting or diarrhea. No abdominal pain or blood.  GENITOURINARY: No burning on urination, no polyuria NEUROLOGICAL: No headache, dizziness, syncope, paralysis, ataxia, numbness or tingling in the extremities. No change in bowel or bladder control.  MUSCULOSKELETAL: No muscle, back pain, joint pain or stiffness.  LYMPHATICS: No enlarged nodes. No history of splenectomy.  PSYCHIATRIC: No history of depression or anxiety.  ENDOCRINOLOGIC: No reports of sweating, cold or heat intolerance. No polyuria or polydipsia.  Marland Kitchen   Physical Examination Today's Vitals   05/31/23 1536  BP: 122/84  Pulse: 60  SpO2: 98%  Weight: 185 lb 3.2 oz (84 kg)  Height: 5\' 8"  (1.727 m)   Body mass index is 28.16 kg/m.  Gen: resting comfortably, no acute  distress HEENT: no scleral icterus, pupils equal round and reactive, no palptable cervical adenopathy,  CV: RRR, no mrg, no jvd Resp: Clear to auscultation bilaterally GI: abdomen is soft, non-tender, non-distended, normal bowel sounds, no hepatosplenomegaly MSK: extremities are warm, no edema.  Skin: warm, no rash Neuro:  no focal deficits Psych: appropriate affect   Diagnostic Studies  03/2019 echo IMPRESSIONS     1. Stage 1: 1: Basal inferolateral segment, mid anterolateral segment,  mid inferolateral segment, and basal inferior segment are abnormal.   2. The left ventricle has moderate-severely reduced systolic function,  with an ejection fraction of 30-35%. The cavity size was severely dilated.  Left ventricular diastolic Doppler parameters are consistent with  pseudonormalization.   3. Left atrial size was mild-moderately dilated.   4. The mitral valve is grossly normal.   5. The tricuspid valve is grossly normal.   6. The aortic valve is tricuspid. Mild thickening of the aortic valve.  Mild calcification of the aortic valve.   7. There is mild dilatation of the aortic root measuring 37 mm.    07/2014 cath IMPRESSIONS:   Severe native two-vessel coronary artery disease with occlusion of the mid LAD, severe stenosis prior to a moderate-sized diagonal Jericho Cieslik and moderate disease prior to the circumflex marginal Ahren Pettinger of the circumflex. Occluded right coronary artery distally with a severe proximal stenosis.  Patent saphenous vein graft to place the right coronary artery and patent mammary graft to LAD, previously occluded vein graft to the marginal system Potential sites of ischemia are the distal posterolateral  Shawnice Tilmon of the right coronary artery and moderate size diagonal Jamia Hoban   11/2021 echo 1. Left ventricular ejection fraction, by estimation, is 30 to 35%. The  left ventricle has moderately decreased function. The left ventricle  demonstrates regional wall motion  abnormalities (see scoring  diagram/findings for description). The left  ventricular internal cavity size was mildly to moderately dilated. Left  ventricular diastolic parameters are consistent with Grade I diastolic  dysfunction (impaired relaxation).   2. Right ventricular systolic function is normal. The right ventricular  size is normal. There is normal pulmonary artery systolic pressure. The  estimated right ventricular systolic pressure is 27.4 mmHg.   3. Left atrial size was moderately dilated.   4. The mitral valve is grossly normal. Mild mitral valve regurgitation.   5. The aortic valve is tricuspid. Aortic valve regurgitation is not  visualized. No aortic stenosis is present. Aortic valve mean gradient  measures 3.5 mmHg.   6. The inferior vena cava is normal in size with greater than 50%  respiratory variability, suggesting right atrial pressure of 3 mmHg.     Assessment and Plan   1. CAD/ICM/Chronic systolic HF - medical therapy limited by cost, he had recentlyt checked on costs of entresto and jardiance with his insurance. - euvolemic today without symptoms, continue current meds   2. History of VT - followed by EP, on sotalol and has ICD -no recent palpitations - continue to monitor   3. HTN - bp at goal, continue current meds  4. Hyperlipidemia -LDL at goal, continue current meds  5. Chest pain - atypical electrical shooting like pain for just a second, can be repetitive - ask for early device check. If benign perhaps an atypical form of angina, consider exercise nuclear stress test.        Antoine Poche, M.D.

## 2023-06-05 ENCOUNTER — Ambulatory Visit: Payer: Medicare Other | Attending: Cardiovascular Disease

## 2023-06-05 DIAGNOSIS — I5022 Chronic systolic (congestive) heart failure: Secondary | ICD-10-CM | POA: Diagnosis not present

## 2023-06-05 DIAGNOSIS — Z95810 Presence of automatic (implantable) cardiac defibrillator: Secondary | ICD-10-CM

## 2023-06-09 ENCOUNTER — Encounter: Payer: Self-pay | Admitting: Cardiology

## 2023-06-09 ENCOUNTER — Telehealth: Payer: Self-pay

## 2023-06-09 NOTE — Telephone Encounter (Signed)
No abnormalities noted, MyChart message sent to patient.

## 2023-06-09 NOTE — Telephone Encounter (Signed)
ICM Call to patient.  He advised Dr Wyline Mood asked for review of 06/05/2023 remote transmission due to patient was seen in the office for what he describes as a feeling of "jolt' in his left breast which occurred multiple times in a row on several occasions.  The jolts only lasted a lasts a few seconds.   Advised patient will have device clinic triage to review 8/12 [facility] report for any abnormalities and send to Dr Wyline Mood.

## 2023-06-09 NOTE — Progress Notes (Signed)
EPIC Encounter for ICM Monitoring  Patient Name: Paul [name] is a 72 y.o. male Date: 06/09/2023 Primary Care Physican: Eartha Inch, MD Primary Cardiologist: Branch Electrophysiologist:  Ladona Ridgel 01/18/2023 Weight: 180.8 lbs 02/22/2023   Weight: 181 lbs 03/29/2023   Weight: 182 lbs 05/03/2023 Weight: 181.2 lbs 06/09/2023 Weight: 181 lbs   Time in AT/AF   0.0 hr/day (0.0%)           Spoke with patient and heart failure questions reviewed.  Transmission results reviewed.  Pt asymptomatic for fluid accumulation.  He had ED visit for chest pain and then followed up with Dr Wyline Mood.  Dr Wyline Mood did not have anything abnormal and wanted the 8/12 remote transmission reviewed by device clinic.  Phone note sent to device clinic for transmission review and advised to forward to Dr Wyline Mood.    Optivol thoracic impedance suggesting normal fluid levels within the last month.    Prescribed: Hydrochlorothiazide 12.5 mg Take 1 capsule (12.5 mg total) by mouth as needed (weight gain 3 lbs or more in 24 hours, 5 lbs in a week or swelling).     Labs: 05/08/2023 Creatinine 1.29, BUN 15, Potassium 4.7, Sodium 131, GFR 59 A complete set of results can be found in Results Review.   Recommendations:  No changes and encouraged to call if experiencing any fluid symptoms.     Follow-up plan: ICM clinic phone appointment on 07/10/2023.  91 day device clinic remote transmission 07/05/2023.     EP/Cardiology Office Visits:   09/12/2023 with Dr Wyline Mood.   Pt is scheduling appt with Dr Ladona Ridgel for 6 month follow up (switching to [place] office from [place] office).     Copy of ICM check sent to Dr. Ladona Ridgel.   3 month ICM trend: 06/05/2023.    12-14 Month ICM trend:     Karie Soda, RN 06/09/2023 1:35 PM

## 2023-06-17 ENCOUNTER — Other Ambulatory Visit: Payer: Self-pay | Admitting: Cardiology

## 2023-06-19 ENCOUNTER — Other Ambulatory Visit: Payer: Self-pay | Admitting: Cardiology

## 2023-06-19 ENCOUNTER — Encounter: Payer: Self-pay | Admitting: Cardiology

## 2023-06-19 MED ORDER — NITROGLYCERIN 0.4 MG SL SUBL: 0.4000 mg | SUBLINGUAL_TABLET | SUBLINGUAL | 3 refills | Status: AC | PRN

## 2023-07-05 ENCOUNTER — Ambulatory Visit (INDEPENDENT_AMBULATORY_CARE_PROVIDER_SITE_OTHER): Payer: Medicare Other

## 2023-07-05 DIAGNOSIS — I5022 Chronic systolic (congestive) heart failure: Secondary | ICD-10-CM

## 2023-07-05 DIAGNOSIS — I255 Ischemic cardiomyopathy: Secondary | ICD-10-CM | POA: Diagnosis not present

## 2023-07-05 LAB — CUP PACEART REMOTE DEVICE CHECK
Battery Remaining Longevity: 14 mo
Battery Voltage: 2.9 V
Brady Statistic AP VP Percent: 0.01 %
Brady Statistic AP VS Percent: 5.39 %
Brady Statistic AS VP Percent: 0.08 %
Brady Statistic AS VS Percent: 94.52 %
Brady Statistic RA Percent Paced: 5.36 %
Brady Statistic RV Percent Paced: 0.09 %
Date Time Interrogation Session: 20240911043724
HighPow Impedance: 72 Ohm
Implantable Lead Connection Status: 753985
Implantable Lead Connection Status: 753985
Implantable Lead Implant Date: 20151008
Implantable Lead Implant Date: 20151008
Implantable Lead Location: 753859
Implantable Lead Location: 753860
Implantable Lead Model: 5076
Implantable Lead Model: 6935
Implantable Pulse Generator Implant Date: 20151008
Lead Channel Impedance Value: 285 Ohm
Lead Channel Impedance Value: 304 Ohm
Lead Channel Impedance Value: 418 Ohm
Lead Channel Pacing Threshold Amplitude: 1.25 V
Lead Channel Pacing Threshold Amplitude: 1.5 V
Lead Channel Pacing Threshold Pulse Width: 0.4 ms
Lead Channel Pacing Threshold Pulse Width: 0.4 ms
Lead Channel Sensing Intrinsic Amplitude: 1.875 mV
Lead Channel Sensing Intrinsic Amplitude: 1.875 mV
Lead Channel Sensing Intrinsic Amplitude: 6.75 mV
Lead Channel Sensing Intrinsic Amplitude: 6.75 mV
Lead Channel Setting Pacing Amplitude: 2.5 V
Lead Channel Setting Pacing Amplitude: 2.5 V
Lead Channel Setting Pacing Pulse Width: 0.8 ms
Lead Channel Setting Sensing Sensitivity: 0.3 mV
Zone Setting Status: 755011
Zone Setting Status: 755011

## 2023-07-10 ENCOUNTER — Ambulatory Visit: Payer: Medicare Other | Attending: Internal Medicine

## 2023-07-10 DIAGNOSIS — I5022 Chronic systolic (congestive) heart failure: Secondary | ICD-10-CM | POA: Diagnosis not present

## 2023-07-10 DIAGNOSIS — Z95810 Presence of automatic (implantable) cardiac defibrillator: Secondary | ICD-10-CM | POA: Diagnosis not present

## 2023-07-13 NOTE — Progress Notes (Signed)
EPIC Encounter for ICM Monitoring  Patient Name: Paul [name] is a 72 y.o. male Date: 07/13/2023 Primary Care Physican: Eartha Inch, MD Primary Cardiologist: Branch Electrophysiologist:  Ladona Ridgel 01/18/2023 Weight: 180.8 lbs 02/22/2023   Weight: 181 lbs 03/29/2023   Weight: 182 lbs 05/03/2023 Weight: 181.2 lbs 06/09/2023 Weight: 181 lbs 07/13/2023 Weight: 181.8 lbs   Time in AT/AF   0.0 hr/day (0.0%)           Spoke with patient and heart failure questions reviewed.  Transmission results reviewed.  Pt asymptomatic for fluid accumulation.  Reports feeling well at this time and voices no complaints.     Optivol thoracic impedance suggesting normal fluid levels within the last month.    Prescribed: Hydrochlorothiazide 12.5 mg Take 1 capsule (12.5 mg total) by mouth as needed (weight gain 3 lbs or more in 24 hours, 5 lbs in a week or swelling).     Labs: 05/08/2023 Creatinine 1.29, BUN 15, Potassium 4.7, Sodium 131, GFR 59 A complete set of results can be found in Results Review.   Recommendations:  No changes and encouraged to call if experiencing any fluid symptoms.     Follow-up plan: ICM clinic phone appointment on 08/14/2023.  91 day device clinic remote transmission 10/04/2023.     EP/Cardiology Office Visits:   09/12/2023 with Dr Wyline Mood.   09/14/2023 with Dr Ladona Ridgel.     Copy of ICM check sent to Dr. Ladona Ridgel.   3 month ICM trend: 07/10/2023.    12-14 Month ICM trend:     Karie Soda, RN 07/13/2023 11:15 AM

## 2023-07-21 NOTE — Progress Notes (Signed)
Remote ICD transmission.   

## 2023-07-22 ENCOUNTER — Other Ambulatory Visit: Payer: Self-pay | Admitting: Cardiology

## 2023-07-31 ENCOUNTER — Telehealth: Payer: Self-pay

## 2023-07-31 NOTE — Telephone Encounter (Signed)
Spoke with patient regarding [employer] CPT code 82956.  Advised [facility] billing person explained that [employer] uses the correct CPT code but words it as diagnostic.  He's had numerous conversations with [employer] and tried appeals as well.  He understands the information provided.  He will be changing insurance plans at enrollment.  He appreciated investigating it as well.

## 2023-08-09 ENCOUNTER — Other Ambulatory Visit: Payer: Self-pay | Admitting: *Deleted

## 2023-08-09 ENCOUNTER — Encounter: Payer: Self-pay | Admitting: Cardiology

## 2023-08-10 MED ORDER — METOPROLOL SUCCINATE ER 50 MG PO TB24: 50.0000 mg | ORAL_TABLET | Freq: Every day | ORAL | 2 refills | Status: DC

## 2023-08-10 NOTE — Telephone Encounter (Signed)
Per Branch-"yes can refill toprol"

## 2023-08-14 ENCOUNTER — Ambulatory Visit: Payer: Medicare Other | Attending: Internal Medicine

## 2023-08-14 DIAGNOSIS — Z95810 Presence of automatic (implantable) cardiac defibrillator: Secondary | ICD-10-CM

## 2023-08-14 DIAGNOSIS — I5022 Chronic systolic (congestive) heart failure: Secondary | ICD-10-CM

## 2023-08-16 ENCOUNTER — Telehealth: Payer: Self-pay

## 2023-08-16 NOTE — Progress Notes (Signed)
EPIC Encounter for ICM Monitoring  Patient Name: Paul [name] is a 72 y.o. male Date: 08/16/2023 Primary Care Physican: Eartha Inch, MD Primary Cardiologist: Branch Electrophysiologist:  Ladona Ridgel 01/18/2023 Weight: 180.8 lbs 02/22/2023   Weight: 181 lbs 03/29/2023   Weight: 182 lbs 05/03/2023 Weight: 181.2 lbs 06/09/2023 Weight: 181 lbs 07/13/2023 Weight: 181.8 lbs   Time in AT/AF   0.0 hr/day (0.0%)           Attempted call to patient and unable to reach.  Left detailed message per DPR regarding transmission. Transmission reviewed.    Optivol thoracic impedance suggesting normal fluid levels with the exception of possible fluid accumulation from 10/4-10/9.   Prescribed: Hydrochlorothiazide 12.5 mg Take 1 capsule (12.5 mg total) by mouth as needed (weight gain 3 lbs or more in 24 hours, 5 lbs in a week or swelling).     Labs: 05/08/2023 Creatinine 1.29, BUN 15, Potassium 4.7, Sodium 131, GFR 59 A complete set of results can be found in Results Review.   Recommendations:  Left voice mail with ICM number and encouraged to call if experiencing any fluid symptoms.   Follow-up plan: ICM clinic phone appointment on 09/18/2023.  91 day device clinic remote transmission 10/04/2023.     EP/Cardiology Office Visits:   09/12/2023 with Dr Wyline Mood.   09/14/2023 with Dr Ladona Ridgel.     Copy of ICM check sent to Dr. Ladona Ridgel.   3 month ICM trend: 08/14/2023.    12-14 Month ICM trend:     Karie Soda, RN 08/16/2023 2:47 PM

## 2023-08-16 NOTE — Telephone Encounter (Signed)
Remote ICM transmission received.  Attempted call to patient regarding ICM remote transmission and left detailed message per DPR.  Left ICM phone number and advised to return call for any fluid symptoms or questions. Next ICM remote transmission scheduled 09/18/2023.

## 2023-08-17 ENCOUNTER — Encounter: Payer: Self-pay | Admitting: Cardiology

## 2023-08-17 ENCOUNTER — Other Ambulatory Visit: Payer: Self-pay | Admitting: Cardiology

## 2023-08-17 MED ORDER — CLOPIDOGREL BISULFATE 75 MG PO TABS: 75.0000 mg | ORAL_TABLET | Freq: Every day | ORAL | 1 refills | Status: DC

## 2023-09-12 ENCOUNTER — Encounter: Payer: Self-pay | Admitting: Cardiology

## 2023-09-12 ENCOUNTER — Ambulatory Visit: Payer: Medicare Other | Attending: Cardiology | Admitting: Cardiology

## 2023-09-12 VITALS — BP 112/60 | HR 70 | Ht 68.0 in | Wt 188.6 lb

## 2023-09-12 DIAGNOSIS — I5022 Chronic systolic (congestive) heart failure: Secondary | ICD-10-CM

## 2023-09-12 DIAGNOSIS — I10 Essential (primary) hypertension: Secondary | ICD-10-CM

## 2023-09-12 DIAGNOSIS — I472 Ventricular tachycardia, unspecified: Secondary | ICD-10-CM | POA: Diagnosis not present

## 2023-09-12 DIAGNOSIS — I25810 Atherosclerosis of coronary artery bypass graft(s) without angina pectoris: Secondary | ICD-10-CM | POA: Diagnosis not present

## 2023-09-12 NOTE — Patient Instructions (Signed)
Medication Instructions:  Continue all current medications.  Labwork: none  Testing/Procedures: none  Follow-Up: 4 months   Any Other Special Instructions Will Be Listed Below (If Applicable).  If you need a refill on your cardiac medications before your next appointment, please call your pharmacy.\ 

## 2023-09-12 NOTE — Progress Notes (Signed)
Clinical Summary Mr. Marn is a 72 y.o.male seen today for follow up of the following medical problems.    1. CAD/ICM/Chronic systolic HF - history of prior CABG in 1999 as reported below - from notes has not been able to afford entresto and also intolerant to lasix in the past, has used HCTZ as diuretic. Previous fatigue on coreg, has tolerated toprol. Gynecomastia on aldactone now on eplerenone - 03/2019 echo LVEF 30-35%, grade II dd -07/2014 cath as reported below - fluid well controlled with prn HCTZ   - ICD followed by EP  11/2021 echo: LVEF 30-35%, grade I dd, normal RV     -no SOB/DOE, no recent edema - no chest pains - complaint with meds   2. History of VT - followed by Dr Johney Frame - has been on sotalol, has ICD in place - amio allergy    - no recent palpitations - normal device check    3. 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, football and basketball Past Medical History:  Diagnosis Date   Basal cell carcinoma 07/17/2017   nod-L cheek (CX35FU)   BPH (benign prostatic hyperplasia)    CAD (coronary artery disease)    widely patent LAD LIMA graft, widely patent PDA PLR SVG, occluded Diag 1 OM 1 SVG by cardiac CT 2007    CHF (congestive heart failure) (HCC)    ECHO 03/2019: EF 30-35%   Fatty liver    GERD (gastroesophageal reflux disease)    Hyperlipidemia    IBS (irritable bowel syndrome)    ICD (implantable cardioverter-defibrillator) battery depletion    Ischemic cardiomyopathy    Prior inferior infarct EF 35% by ECHO 07/13/11 ; ECHO 03/2019: EF 30-35%   S/P CABG (coronary artery bypass graft)    CABG 06/1998 [place], [place] with LIMA to LAD, SVG to OM, SVG to PD-PL.   OM graft occluded 10/12/2005 by CTA    SCC (squamous cell carcinoma) 11/11/2016   in situ- R anti helix  (CX35FU), well diff-R upper crust of helix (CX35FU)   Sleep apnea    Ventricular tachycardia (HCC) 10/15   VT at 188 bpm- unstable requiring external defibrillation     Allergies  Allergen Reactions   Amiodarone     Thyroid issues   Carvedilol     Fatigue    Escitalopram Oxalate     unknown   Lasix [Furosemide]     Stomach upset and nausea   Niacin And Related     Flushing    Penicillins     Can take amoxicillin, and azithromycin but all others "upset my stomach"   Spironolactone     gynecomastia     Current Outpatient Medications  Medication Sig Dispense Refill   Ascorbic Acid (VITAMIN C) 1000 MG tablet Take 1,000 mg by mouth daily.     atorvastatin (LIPITOR) 80 MG tablet TAKE (1) TABLET BY MOUTH AT BEDTIME. 90 tablet 1   benzonatate (TESSALON) 200 MG capsule Take 1 capsule (200 mg total) by mouth 3 (three) times daily as needed for cough. 30 capsule 0   cetirizine (ZYRTEC) 10 MG tablet Take 10 mg by mouth daily.  clopidogrel (PLAVIX) 75 MG tablet Take 1 tablet (75 mg total) by mouth daily. 90 tablet 1   Coenzyme Q10 50 MG CAPS Take 1 capsule by mouth daily.     dicyclomine (BENTYL) 20 MG tablet Take 20 mg by mouth 3 (three) times daily as needed.     eplerenone (INSPRA) 25 MG tablet TAKE ONE TABLET BY MOUTH ONCE DAILY. 90 tablet 2   fenofibrate 160 MG tablet Take 160 mg by mouth daily.     fluticasone (FLONASE) 50 MCG/ACT nasal spray Place 2 sprays into both nostrils daily. (Patient taking differently: Place 2 sprays into both nostrils as needed.) 16 g 6   folic acid (FOLVITE) 800 MCG tablet Take 800 mcg by mouth daily.      glycopyrrolate (ROBINUL) 2 MG tablet Take 2 mg by mouth 2 (two) times daily.     hydrochlorothiazide (MICROZIDE) 12.5 MG capsule TAKE (1) CAPSULE BY MOUTH ONCE DAILY AS NEEDED *FOR WEIGHT GAIN 3 LBS OR MORE IN 24 HOURS / 5 LBS A WEEK / SWELLING* 30 capsule 5   MAGNESIUM GLYCINATE PO Take 200 mg by mouth in the morning and at bedtime.     Melatonin  5 MG TABS Take 5 mg by mouth at bedtime.      metoprolol succinate (TOPROL-XL) 50 MG 24 hr tablet Take 1 tablet (50 mg total) by mouth daily. 90 tablet 2   nitroGLYCERIN (NITROSTAT) 0.4 MG SL tablet Place 1 tablet (0.4 mg total) under the tongue every 5 (five) minutes x 3 doses as needed for chest pain (if no relief after 2nd dose, proceed to ED or call 911). 25 tablet 3   Omega-3 Fatty Acids (FISH OIL) 1000 MG CAPS Take 1 capsule by mouth daily.     OVER THE COUNTER MEDICATION 1,250 mg daily. Extra Virgin IT trainer. Gel capsule.     pantoprazole (PROTONIX) 20 MG tablet Take 20 mg by mouth daily.     ramipril (ALTACE) 10 MG capsule TAKE 1 CAPSULE BY MOUTH ONCE DAILY. 90 capsule 1   sildenafil (VIAGRA) 50 MG tablet Take 1 tablet (50 mg total) by mouth daily as needed for erectile dysfunction. 20 tablet 3   sotalol (BETAPACE) 80 MG tablet Take 1 tablet (80 mg total) by mouth 2 (two) times daily. 60 tablet 6   Vitamin D, Cholecalciferol, 25 MCG (1000 UT) CAPS Take by mouth.      Vitamin E 400 UNITS TABS Take 400 Units by mouth daily.      No current facility-administered medications for this visit.     Past Surgical History:  Procedure Laterality Date   APPENDECTOMY     COLONOSCOPY  06/11/2010   [facility]; mild nonspecific erythematous mucosa in the descending colon s/p biopsied, diverticulosis, internal hemorrhoids.  Pathology with focal active colitis, nonspecific, main consideration includes acute infectious colitis.  NSAID induced colitis and/or Crohn's disease also in the differential but less likely.   CORONARY ARTERY BYPASS GRAFT  1999   IMPLANTABLE CARDIOVERTER DEFIBRILLATOR IMPLANT N/A 07/31/2014   MDT Melvyn Neth XT DR ICD implanted by Dr Johney Frame for secondary treatent of VT   LEFT HEART CATHETERIZATION WITH CORONARY/GRAFT ANGIOGRAM N/A 07/30/2014   Procedure: LEFT HEART CATHETERIZATION WITH Isabel Caprice;  Surgeon: Othella Boyer, MD;  Location: [facility] CATH LAB;  Service:  Cardiovascular;  Laterality: N/A;     Allergies  Allergen Reactions   Amiodarone     Thyroid issues   Carvedilol     Fatigue  Escitalopram Oxalate     unknown   Lasix [Furosemide]     Stomach upset and nausea   Niacin And Related     Flushing    Penicillins     Can take amoxicillin, and azithromycin but all others "upset my stomach"   Spironolactone     gynecomastia      Family History  Problem Relation Age of Onset   Dementia Mother    Cerebral aneurysm Father    Pneumonia Brother    Stroke Maternal Aunt    Diabetes Neg Hx    Heart attack Neg Hx    Hypertension Neg Hx    Colon cancer Neg Hx      Social History Mr. Angle reports that he has never smoked. He has never used smokeless tobacco. Mr. Hempel reports current alcohol use of about 2.0 standard drinks of alcohol per week.   Review of Systems CONSTITUTIONAL: No weight loss, fever, chills, weakness or fatigue.  HEENT: Eyes: No visual loss, blurred vision, double vision or yellow sclerae.No hearing loss, sneezing, congestion, runny nose or sore throat.  SKIN: No rash or itching.  CARDIOVASCULAR: per hpi RESPIRATORY: No shortness of breath, cough or sputum.  GASTROINTESTINAL: No anorexia, nausea, vomiting or diarrhea. No abdominal pain or blood.  GENITOURINARY: No burning on urination, no polyuria NEUROLOGICAL: No headache, dizziness, syncope, paralysis, ataxia, numbness or tingling in the extremities. No change in bowel or bladder control.  MUSCULOSKELETAL: No muscle, back pain, joint pain or stiffness.  LYMPHATICS: No enlarged nodes. No history of splenectomy.  PSYCHIATRIC: No history of depression or anxiety.  ENDOCRINOLOGIC: No reports of sweating, cold or heat intolerance. No polyuria or polydipsia.  Marland Kitchen   Physical Examination Today's Vitals   09/12/23 1326  BP: 112/60  Pulse: 70  SpO2: 96%  Weight: 188 lb 9.6 oz (85.5 kg)  Height: 5\' 8"  (1.727 m)   Body mass index is 28.68 kg/m.  Gen:  resting comfortably, no acute distress HEENT: no scleral icterus, pupils equal round and reactive, no palptable cervical adenopathy,  CV: RRR, no m/rg, no jvd Resp: Clear to auscultation bilaterally GI: abdomen is soft, non-tender, non-distended, normal bowel sounds, no hepatosplenomegaly MSK: extremities are warm, no edema.  Skin: warm, no rash Neuro:  no focal deficits Psych: appropriate affect   Diagnostic Studies  03/2019 echo IMPRESSIONS     1. Stage 1: 1: Basal inferolateral segment, mid anterolateral segment,  mid inferolateral segment, and basal inferior segment are abnormal.   2. The left ventricle has moderate-severely reduced systolic function,  with an ejection fraction of 30-35%. The cavity size was severely dilated.  Left ventricular diastolic Doppler parameters are consistent with  pseudonormalization.   3. Left atrial size was mild-moderately dilated.   4. The mitral valve is grossly normal.   5. The tricuspid valve is grossly normal.   6. The aortic valve is tricuspid. Mild thickening of the aortic valve.  Mild calcification of the aortic valve.   7. There is mild dilatation of the aortic root measuring 37 mm.    07/2014 cath IMPRESSIONS:   Severe native two-vessel coronary artery disease with occlusion of the mid LAD, severe stenosis prior to a moderate-sized diagonal Ghina Bittinger and moderate disease prior to the circumflex marginal Avilyn Virtue of the circumflex. Occluded right coronary artery distally with a severe proximal stenosis.  Patent saphenous vein graft to place the right coronary artery and patent mammary graft to LAD, previously occluded vein graft to the marginal system Potential sites  of ischemia are the distal posterolateral Joseeduardo Brix of the right coronary artery and moderate size diagonal Alecxis Baltzell   11/2021 echo 1. Left ventricular ejection fraction, by estimation, is 30 to 35%. The  left ventricle has moderately decreased function. The left ventricle   demonstrates regional wall motion abnormalities (see scoring  diagram/findings for description). The left  ventricular internal cavity size was mildly to moderately dilated. Left  ventricular diastolic parameters are consistent with Grade I diastolic  dysfunction (impaired relaxation).   2. Right ventricular systolic function is normal. The right ventricular  size is normal. There is normal pulmonary artery systolic pressure. The  estimated right ventricular systolic pressure is 27.4 mmHg.   3. Left atrial size was moderately dilated.   4. The mitral valve is grossly normal. Mild mitral valve regurgitation.   5. The aortic valve is tricuspid. Aortic valve regurgitation is not  visualized. No aortic stenosis is present. Aortic valve mean gradient  measures 3.5 mmHg.   6. The inferior vena cava is normal in size with greater than 50%  respiratory variability, suggesting right atrial pressure of 3 mmHg.     Assessment and Plan   1. CAD/ICM/Chronic systolic HF - medical therapy limited by cost, he had recentlyt checked on costs of entresto and jardiance with his insurance.  - new insurance plan starts January, likely revisit potential med changes at next f/u - no symptoms, euvolemic today. Continue current meds  2. History of VT - followed by EP, on sotalol and has ICD - no symptoms, has EP appt later this week   3. HTN - he is at goal, continue current meds   4. Hyperlipidemia -lipids at goal, continue current meds        Antoine Poche, M.D

## 2023-09-14 ENCOUNTER — Encounter: Payer: Self-pay | Admitting: Internal Medicine

## 2023-09-14 ENCOUNTER — Ambulatory Visit: Payer: Medicare Other | Attending: Internal Medicine | Admitting: Internal Medicine

## 2023-09-14 VITALS — BP 122/66 | HR 62 | Ht 68.0 in | Wt 185.0 lb

## 2023-09-14 DIAGNOSIS — I255 Ischemic cardiomyopathy: Secondary | ICD-10-CM | POA: Diagnosis not present

## 2023-09-14 LAB — CUP PACEART INCLINIC DEVICE CHECK
Date Time Interrogation Session: 20241121145419
Implantable Lead Connection Status: 753985
Implantable Lead Connection Status: 753985
Implantable Lead Implant Date: 20151008
Implantable Lead Implant Date: 20151008
Implantable Lead Location: 753859
Implantable Lead Location: 753860
Implantable Lead Model: 5076
Implantable Lead Model: 6935
Implantable Pulse Generator Implant Date: 20151008

## 2023-09-14 NOTE — Progress Notes (Signed)
HPI Paul [name] returns for additional eval. He is a pleasant 72 y.o. male who had seen Dr. Fawn Kirk and then [facility] who now presents today for routine electrophysiology followup.  Since last being seen in our clinic, the patient reports doing very well. He denies chest pain or sob. No syncope. He denies any ICD therapies.  Allergies  Allergen Reactions   Amiodarone     Thyroid issues   Carvedilol     Fatigue    Escitalopram Oxalate     unknown   Lasix [Furosemide]     Stomach upset and nausea   Niacin And Related     Flushing    Penicillins     Can take amoxicillin, and azithromycin but all others "upset my stomach"   Spironolactone     gynecomastia     Current Outpatient Medications  Medication Sig Dispense Refill   Ascorbic Acid (VITAMIN C) 1000 MG tablet Take 1,000 mg by mouth daily.     atorvastatin (LIPITOR) 80 MG tablet TAKE (1) TABLET BY MOUTH AT BEDTIME. 90 tablet 1   benzonatate (TESSALON) 200 MG capsule Take 1 capsule (200 mg total) by mouth 3 (three) times daily as needed for cough. 30 capsule 0   cetirizine (ZYRTEC) 10 MG tablet Take 10 mg by mouth daily.     clopidogrel (PLAVIX) 75 MG tablet Take 1 tablet (75 mg total) by mouth daily. 90 tablet 1   Coenzyme Q10 50 MG CAPS Take 1 capsule by mouth daily.     eplerenone (INSPRA) 25 MG tablet TAKE ONE TABLET BY MOUTH ONCE DAILY. 90 tablet 2   fenofibrate 160 MG tablet Take 160 mg by mouth daily.     fluticasone (FLONASE) 50 MCG/ACT nasal spray Place 2 sprays into both nostrils daily. (Patient taking differently: Place 2 sprays into both nostrils as needed.) 16 g 6   folic acid (FOLVITE) 800 MCG tablet Take 800 mcg by mouth daily.      glycopyrrolate (ROBINUL) 2 MG tablet Take 2 mg by mouth 2 (two) times daily.     hydrochlorothiazide (MICROZIDE) 12.5 MG capsule TAKE (1) CAPSULE BY MOUTH ONCE DAILY AS NEEDED *FOR WEIGHT GAIN 3 LBS OR MORE IN 24 HOURS / 5 LBS A WEEK / SWELLING* 30 capsule 5   MAGNESIUM GLYCINATE PO Take  200 mg by mouth in the morning and at bedtime.     Melatonin 5 MG TABS Take 5 mg by mouth at bedtime.      metoprolol succinate (TOPROL-XL) 50 MG 24 hr tablet Take 1 tablet (50 mg total) by mouth daily. 90 tablet 2   nitroGLYCERIN (NITROSTAT) 0.4 MG SL tablet Place 1 tablet (0.4 mg total) under the tongue every 5 (five) minutes x 3 doses as needed for chest pain (if no relief after 2nd dose, proceed to ED or call 911). 25 tablet 3   Omega-3 Fatty Acids (FISH OIL) 1000 MG CAPS Take 1 capsule by mouth daily.     OVER THE COUNTER MEDICATION 1,250 mg daily. Extra Virgin IT trainer. Gel capsule.     pantoprazole (PROTONIX) 20 MG tablet Take 20 mg by mouth daily.     ramipril (ALTACE) 10 MG capsule TAKE 1 CAPSULE BY MOUTH ONCE DAILY. 90 capsule 1   sildenafil (VIAGRA) 50 MG tablet Take 1 tablet (50 mg total) by mouth daily as needed for erectile dysfunction. 20 tablet 3   sotalol (BETAPACE) 80 MG tablet Take 1 tablet (80 mg total) by  mouth 2 (two) times daily. 60 tablet 6   Vitamin D, Cholecalciferol, 25 MCG (1000 UT) CAPS Take by mouth.      Vitamin E 400 UNITS TABS Take 400 Units by mouth daily.      No current facility-administered medications for this visit.     Past Medical History:  Diagnosis Date   Basal cell carcinoma 07/17/2017   nod-L cheek (CX35FU)   BPH (benign prostatic hyperplasia)    CAD (coronary artery disease)    widely patent LAD LIMA graft, widely patent PDA PLR SVG, occluded Diag 1 OM 1 SVG by cardiac CT 2007    CHF (congestive heart failure) (HCC)    ECHO 03/2019: EF 30-35%   Fatty liver    GERD (gastroesophageal reflux disease)    Hyperlipidemia    IBS (irritable bowel syndrome)    ICD (implantable cardioverter-defibrillator) battery depletion    Ischemic cardiomyopathy    Prior inferior infarct EF 35% by ECHO 07/13/11 ; ECHO 03/2019: EF 30-35%   S/P CABG (coronary artery bypass graft)    CABG 06/1998 [place], [place] with LIMA to LAD, SVG to OM, SVG to PD-PL.   OM graft  occluded 10/12/2005 by CTA    SCC (squamous cell carcinoma) 11/11/2016   in situ- R anti helix (CX35FU), well diff-R upper crust of helix (CX35FU)   Sleep apnea    Ventricular tachycardia (HCC) 10/15   VT at 188 bpm- unstable requiring external defibrillation    ROS:   All systems reviewed and negative except as noted in the HPI.   Past Surgical History:  Procedure Laterality Date   APPENDECTOMY     COLONOSCOPY  06/11/2010   [facility]; mild nonspecific erythematous mucosa in the descending colon s/p biopsied, diverticulosis, internal hemorrhoids.  Pathology with focal active colitis, nonspecific, main consideration includes acute infectious colitis.  NSAID induced colitis and/or Crohn's disease also in the differential but less likely.   CORONARY ARTERY BYPASS GRAFT  1999   IMPLANTABLE CARDIOVERTER DEFIBRILLATOR IMPLANT N/A 07/31/2014   MDT Melvyn Neth XT DR ICD implanted by Dr Johney Frame for secondary treatent of VT   LEFT HEART CATHETERIZATION WITH CORONARY/GRAFT ANGIOGRAM N/A 07/30/2014   Procedure: LEFT HEART CATHETERIZATION WITH Isabel Caprice;  Surgeon: Othella Boyer, MD;  Location: [facility] CATH LAB;  Service: Cardiovascular;  Laterality: N/A;     Family History  Problem Relation Age of Onset   Dementia Mother    Cerebral aneurysm Father    Pneumonia Brother    Stroke Maternal Aunt    Diabetes Neg Hx    Heart attack Neg Hx    Hypertension Neg Hx    Colon cancer Neg Hx      Social History   Socioeconomic History   Marital status: Married    Spouse name: Gavin Pound   Number of children: 2   Years of education: Not on file   Highest education level: Not on file  Occupational History   Occupation: retired  Tobacco Use   Smoking status: Never   Smokeless tobacco: Never  Vaping Use   Vaping status: Never Used  Substance and Sexual Activity   Alcohol use: Yes    Alcohol/week: 2.0 standard drinks of alcohol    Types: 2 Glasses of wine per week    Comment: Couple  beers a day and wine with dinner.    Drug use: No   Sexual activity: Not on file  Other Topics Concern   Not on file  Social History Narrative  Not on file   Social Determinants of Health   Financial Resource Strain: Low Risk  (04/10/2023)   Received from [facility], [facility]   Overall Financial Resource Strain (CARDIA)    Difficulty of Paying Living Expenses: Not hard at all  Food Insecurity: No Food Insecurity (04/10/2023)   Received from [facility], [facility]   Hunger Vital Sign    Worried About Running Out of Food in the Last Year: Never true    Ran Out of Food in the Last Year: Never true  Transportation Needs: No Transportation Needs (04/10/2023)   Received from [facility], [facility]   PRAPARE - Transportation    Lack of Transportation (Medical): No    Lack of Transportation (Non-Medical): No  Physical Activity: Sufficiently Active (04/10/2023)   Received from [facility], [facility]   Exercise Vital Sign    Days of Exercise per Week: 3 days    Minutes of Exercise per Session: 50 min  Stress: No Stress Concern Present (04/10/2023)   Received from [facility] - Occupational Stress Questionnaire    Feeling of Stress : Only a little  Social Connections: Socially Integrated (04/10/2023)   Received from [facility], [facility]   Social Network    How would you rate your social network (family, work, friends)?: Good participation with social networks  Intimate Partner Violence: Not At Risk (04/10/2023)   Received from [facility], [facility]   HITS    Over the last 12 months how often did your partner physically hurt you?: Never    Over the last 12 months how often did your partner insult you or talk down to you?: Never    Over the last 12 months how often did your partner threaten you with physical harm?: Never    Over the last 12 months how often did your partner scream or curse at  you?: Never     BP 122/66   Pulse 62   Ht 5\' 8"  (1.727 m)   Wt 185 lb (83.9 kg)   SpO2 94%   BMI 28.13 kg/m   Physical Exam:  Well appearing NAD HEENT: Unremarkable Neck:  No JVD, no thyromegally Lymphatics:  No adenopathy Back:  No CVA tenderness Lungs:  Clear with no wheezes HEART:  Regular rate rhythm, no murmurs, no rubs, no clicks Abd:  soft, positive bowel sounds, no organomegally, no rebound, no guarding Ext:  2 plus pulses, no edema, no cyanosis, no clubbing Skin:  No rashes no nodules Neuro:  CN II through XII intact, motor grossly intact  DEVICE  Normal device function.  See PaceArt for details.   Assess/Plan:  1.  Chronic systolic dysfunction/ CAD/ ischemic CM euvolemic today Stable on an appropriate medical regimen Normal ICD function See Pace Art report No changes today he is not device dependant today followed in ICM device clinic   2. VT Controlled with sotalol Qt is stable   3. OSA Uses dental appliance     Return in 12 months Follow with Dr Wyline Mood as scheduled  Dorathy Daft

## 2023-09-14 NOTE — Patient Instructions (Signed)

## 2023-09-18 ENCOUNTER — Ambulatory Visit: Payer: Medicare Other | Attending: Internal Medicine

## 2023-09-18 DIAGNOSIS — Z95810 Presence of automatic (implantable) cardiac defibrillator: Secondary | ICD-10-CM

## 2023-09-18 DIAGNOSIS — I5022 Chronic systolic (congestive) heart failure: Secondary | ICD-10-CM

## 2023-09-19 NOTE — Progress Notes (Signed)
EPIC Encounter for ICM Monitoring  Patient Name: Paul [name] is a 72 y.o. male Date: 09/19/2023 Primary Care Physican: Eartha Inch, MD Primary Cardiologist: Branch Electrophysiologist:  Ladona Ridgel 01/18/2023 Weight: 180.8 lbs 02/22/2023   Weight: 181 lbs 03/29/2023   Weight: 182 lbs 05/03/2023 Weight: 181.2 lbs 06/09/2023 Weight: 181 lbs 07/13/2023 Weight: 181.8 lbs 09/19/2023 Weight: 181 lbs   Time in AT/AF   0.0 hr/day (0.0%)           Spoke with patient and heart failure questions reviewed.  Transmission results reviewed.  Pt asymptomatic for fluid accumulation.  Reports feeling well at this time and voices no complaints.  Weight increased from 181.5 to 184 lbs during decreased impedance.    Optivol thoracic impedance suggesting possible fluid accumulation starting 11/16 and returned to normal 11/25.   Prescribed: Hydrochlorothiazide 12.5 mg Take 1 capsule (12.5 mg total) by mouth as needed (weight gain 3 lbs or more in 24 hours, 5 lbs in a week or swelling).     Labs: 05/08/2023 Creatinine 1.29, BUN 15, Potassium 4.7, Sodium 131, GFR 59 A complete set of results can be found in Results Review.   Recommendations:  No changes and encouraged to call if experiencing any fluid symptoms.   Follow-up plan: ICM clinic phone appointment on 10/23/2023.  91 day device clinic remote transmission 10/04/2023.     EP/Cardiology Office Visits:   12/26/2023 with Dr Wyline Mood.   09/08/2024 with Dr Ladona Ridgel.     Copy of ICM check sent to Dr. Ladona Ridgel.    3 month ICM trend: 09/18/2023.    12-14 Month ICM trend:     Karie Soda, RN 09/19/2023 4:30 PM

## 2023-09-22 ENCOUNTER — Other Ambulatory Visit: Payer: Self-pay | Admitting: Cardiology

## 2023-10-04 ENCOUNTER — Ambulatory Visit (INDEPENDENT_AMBULATORY_CARE_PROVIDER_SITE_OTHER): Payer: Medicare Other

## 2023-10-04 DIAGNOSIS — I255 Ischemic cardiomyopathy: Secondary | ICD-10-CM

## 2023-10-04 LAB — CUP PACEART REMOTE DEVICE CHECK
Battery Remaining Longevity: 11 mo
Battery Voltage: 2.89 V
Brady Statistic AP VP Percent: 0.01 %
Brady Statistic AP VS Percent: 7.65 %
Brady Statistic AS VP Percent: 0.08 %
Brady Statistic AS VS Percent: 92.26 %
Brady Statistic RA Percent Paced: 7.55 %
Brady Statistic RV Percent Paced: 0.1 %
Date Time Interrogation Session: 20241211012405
HighPow Impedance: 78 Ohm
Implantable Lead Connection Status: 753985
Implantable Lead Connection Status: 753985
Implantable Lead Implant Date: 20151008
Implantable Lead Implant Date: 20151008
Implantable Lead Location: 753859
Implantable Lead Location: 753860
Implantable Lead Model: 5076
Implantable Lead Model: 6935
Implantable Pulse Generator Implant Date: 20151008
Lead Channel Impedance Value: 304 Ohm
Lead Channel Impedance Value: 361 Ohm
Lead Channel Impedance Value: 475 Ohm
Lead Channel Pacing Threshold Amplitude: 1.25 V
Lead Channel Pacing Threshold Amplitude: 1.625 V
Lead Channel Pacing Threshold Pulse Width: 0.4 ms
Lead Channel Pacing Threshold Pulse Width: 0.4 ms
Lead Channel Sensing Intrinsic Amplitude: 1.5 mV
Lead Channel Sensing Intrinsic Amplitude: 1.5 mV
Lead Channel Sensing Intrinsic Amplitude: 8.375 mV
Lead Channel Sensing Intrinsic Amplitude: 8.375 mV
Lead Channel Setting Pacing Amplitude: 2.5 V
Lead Channel Setting Pacing Amplitude: 2.5 V
Lead Channel Setting Pacing Pulse Width: 0.8 ms
Lead Channel Setting Sensing Sensitivity: 0.3 mV
Zone Setting Status: 755011
Zone Setting Status: 755011

## 2023-10-22 ENCOUNTER — Other Ambulatory Visit: Payer: Self-pay | Admitting: Cardiovascular Disease

## 2023-10-22 DIAGNOSIS — Z95810 Presence of automatic (implantable) cardiac defibrillator: Secondary | ICD-10-CM

## 2023-10-22 DIAGNOSIS — I472 Ventricular tachycardia, unspecified: Secondary | ICD-10-CM

## 2023-10-23 ENCOUNTER — Ambulatory Visit: Payer: Medicare Other | Attending: Internal Medicine

## 2023-10-23 DIAGNOSIS — I5022 Chronic systolic (congestive) heart failure: Secondary | ICD-10-CM | POA: Diagnosis not present

## 2023-10-23 DIAGNOSIS — Z95810 Presence of automatic (implantable) cardiac defibrillator: Secondary | ICD-10-CM | POA: Diagnosis not present

## 2023-11-10 NOTE — Progress Notes (Signed)
Remote ICD transmission.   

## 2023-11-27 ENCOUNTER — Ambulatory Visit: Payer: Medicare Other | Attending: Internal Medicine

## 2023-11-27 DIAGNOSIS — Z95810 Presence of automatic (implantable) cardiac defibrillator: Secondary | ICD-10-CM

## 2023-11-27 DIAGNOSIS — I5022 Chronic systolic (congestive) heart failure: Secondary | ICD-10-CM

## 2023-11-30 ENCOUNTER — Telehealth: Payer: Self-pay

## 2023-11-30 NOTE — Telephone Encounter (Signed)
 Remote ICM transmission received.  Attempted call to patient regarding ICM remote transmission and left detailed message per DPR.  Left ICM phone number and advised to return call for any fluid symptoms or questions. Next ICM remote transmission scheduled 01/01/2024.

## 2023-12-26 ENCOUNTER — Ambulatory Visit: Payer: Medicare Other | Admitting: Cardiology

## 2024-01-01 ENCOUNTER — Ambulatory Visit: Payer: Medicare Other | Attending: Internal Medicine

## 2024-01-01 DIAGNOSIS — I5022 Chronic systolic (congestive) heart failure: Secondary | ICD-10-CM

## 2024-01-01 DIAGNOSIS — Z95810 Presence of automatic (implantable) cardiac defibrillator: Secondary | ICD-10-CM | POA: Diagnosis not present

## 2024-01-03 ENCOUNTER — Ambulatory Visit (INDEPENDENT_AMBULATORY_CARE_PROVIDER_SITE_OTHER): Payer: Medicare Other

## 2024-01-03 ENCOUNTER — Telehealth: Payer: Self-pay

## 2024-01-03 DIAGNOSIS — I255 Ischemic cardiomyopathy: Secondary | ICD-10-CM

## 2024-01-03 NOTE — Telephone Encounter (Signed)
 Remote ICM transmission received.  Attempted call to patient regarding ICM remote transmission and left detailed message per DPR.  Left ICM phone number and advised to return call for any fluid symptoms or questions. Next ICM remote transmission scheduled 02/05/2024.

## 2024-01-03 NOTE — Progress Notes (Signed)
 EPIC Encounter for ICM Monitoring  Patient Name: Paul [name] is a 73 y.o. male Date: 01/03/2024 Primary Care Physican: Eartha Inch, MD Primary Cardiologist: Branch Electrophysiologist:  Ladona Ridgel 06/09/2023 Weight: 181 lbs 07/13/2023 Weight: 181.8 lbs 09/19/2023 Weight: 181 lbs 10/27/2023 Weight: 182.5 lbs   Time in AT/AF   0.0 hr/day (0.0%)  Battery Longevity: 8 months           Attempted call to patient and unable to reach.  Left detailed message per DPR regarding transmission.  Transmission results reviewed.    Optivol thoracic impedance suggesting normal fluid levels with the exception of possible fluid accumulation from 2/17-3/3.   Prescribed: Hydrochlorothiazide 12.5 mg Take 1 capsule (12.5 mg total) by mouth as needed (weight gain 3 lbs or more in 24 hours, 5 lbs in a week or swelling).     Labs: 05/08/2023 Creatinine 1.29, BUN 15, Potassium 4.7, Sodium 131, GFR 59 A complete set of results can be found in Results Review.   Recommendations:  Left voice mail with ICM number and encouraged to call if experiencing any fluid symptoms.   Follow-up plan: ICM clinic phone appointment on 02/05/2024.  91 day device clinic remote transmission 04/03/2024.     EP/Cardiology Office Visits:  02/27/2024 with Dr Wyline Mood.   Recall 09/08/2024 with Dr Ladona Ridgel.     Copy of ICM check sent to Dr. Ladona Ridgel.     3 month ICM trend: 01/01/2024.    12-14 Month ICM trend:     Karie Soda, RN 01/03/2024 2:18 PM

## 2024-01-08 ENCOUNTER — Other Ambulatory Visit: Payer: Self-pay | Admitting: Cardiology

## 2024-01-09 ENCOUNTER — Encounter: Payer: Self-pay | Admitting: Internal Medicine

## 2024-01-09 LAB — CUP PACEART REMOTE DEVICE CHECK
Battery Remaining Longevity: 10 mo
Battery Voltage: 2.87 V
Brady Statistic AP VP Percent: 0.05 %
Brady Statistic AP VS Percent: 8.81 %
Brady Statistic AS VP Percent: 0.1 %
Brady Statistic AS VS Percent: 91.03 %
Brady Statistic RA Percent Paced: 8.68 %
Brady Statistic RV Percent Paced: 0.16 %
Date Time Interrogation Session: 20250313101106
HighPow Impedance: 71 Ohm
Implantable Lead Connection Status: 753985
Implantable Lead Connection Status: 753985
Implantable Lead Implant Date: 20151008
Implantable Lead Implant Date: 20151008
Implantable Lead Location: 753859
Implantable Lead Location: 753860
Implantable Lead Model: 5076
Implantable Lead Model: 6935
Implantable Pulse Generator Implant Date: 20151008
Lead Channel Impedance Value: 285 Ohm
Lead Channel Impedance Value: 342 Ohm
Lead Channel Impedance Value: 456 Ohm
Lead Channel Pacing Threshold Amplitude: 1.25 V
Lead Channel Pacing Threshold Amplitude: 1.75 V
Lead Channel Pacing Threshold Pulse Width: 0.4 ms
Lead Channel Pacing Threshold Pulse Width: 0.4 ms
Lead Channel Sensing Intrinsic Amplitude: 1.375 mV
Lead Channel Sensing Intrinsic Amplitude: 1.375 mV
Lead Channel Sensing Intrinsic Amplitude: 8.75 mV
Lead Channel Sensing Intrinsic Amplitude: 8.75 mV
Lead Channel Setting Pacing Amplitude: 2.5 V
Lead Channel Setting Pacing Amplitude: 2.5 V
Lead Channel Setting Pacing Pulse Width: 0.8 ms
Lead Channel Setting Sensing Sensitivity: 0.3 mV
Zone Setting Status: 755011
Zone Setting Status: 755011

## 2024-01-18 ENCOUNTER — Encounter: Payer: Self-pay | Admitting: Cardiology

## 2024-01-18 ENCOUNTER — Other Ambulatory Visit: Payer: Self-pay | Admitting: Cardiology

## 2024-01-18 MED ORDER — RAMIPRIL 10 MG PO CAPS: 10.0000 mg | ORAL_CAPSULE | Freq: Every day | ORAL | 1 refills | Status: AC

## 2024-01-27 ENCOUNTER — Other Ambulatory Visit: Payer: Self-pay | Admitting: Cardiology

## 2024-02-05 ENCOUNTER — Ambulatory Visit: Attending: Internal Medicine

## 2024-02-05 DIAGNOSIS — Z95810 Presence of automatic (implantable) cardiac defibrillator: Secondary | ICD-10-CM

## 2024-02-05 DIAGNOSIS — I5022 Chronic systolic (congestive) heart failure: Secondary | ICD-10-CM | POA: Diagnosis not present

## 2024-02-07 NOTE — Progress Notes (Signed)
 EPIC Encounter for ICM Monitoring  Patient Name: Paul [name] is a 73 y.o. male Date: 02/07/2024 Primary Care Physican: Emaline Handsome, MD Primary Cardiologist: Branch Electrophysiologist:  Carolynne Citron 06/09/2023 Weight: 181 lbs 07/13/2023 Weight: 181.8 lbs 09/19/2023 Weight: 181 lbs 10/27/2023 Weight: 182.5 lbs 02/07/2024 Weight: 181-182 lbs   Time in AT/AF   0.0 hr/day (0.0%) VT-NS (>4 beats, >167 bpm)  5   Battery Longevity: 10 months          Spoke with patient and heart failure questions reviewed.  Transmission results reviewed.  Pt asymptomatic for fluid accumulation.  Reports feeling well at this time and voices no complaints.     Optivol thoracic impedance suggesting normal fluid levels with the exception of possible fluid accumulation from 4/3-4/11.   Prescribed: Hydrochlorothiazide 12.5 mg Take 1 capsule (12.5 mg total) by mouth as needed (weight gain 3 lbs or more in 24 hours, 5 lbs in a week or swelling).     Labs: 05/08/2023 Creatinine 1.29, BUN 15, Potassium 4.7, Sodium 131, GFR 59 A complete set of results can be found in Results Review.   Recommendations:  No changes and encouraged to call if experiencing any fluid symptoms.   Follow-up plan: ICM clinic phone appointment on 03/11/2024.  91 day device clinic remote transmission 04/03/2024.     EP/Cardiology Office Visits:  02/27/2024 with Dr Amanda Jungling.   Recall 09/08/2024 with Dr Carolynne Citron.     Copy of ICM check sent to Dr. Carolynne Citron.    3 month ICM trend: 02/05/2024.    12-14 Month ICM trend:     Almyra Jain, RN 02/07/2024 5:10 PM

## 2024-02-19 NOTE — Addendum Note (Signed)
 Addended by: Lott Rouleau A on: 02/19/2024 01:46 PM   Modules accepted: Orders

## 2024-02-19 NOTE — Progress Notes (Signed)
 Remote ICD transmission.

## 2024-02-23 ENCOUNTER — Encounter: Payer: Medicare Other | Admitting: Cardiovascular Disease

## 2024-02-27 ENCOUNTER — Encounter: Payer: Self-pay | Admitting: Cardiology

## 2024-02-27 ENCOUNTER — Ambulatory Visit: Attending: Cardiology | Admitting: Cardiology

## 2024-02-27 VITALS — BP 130/74 | HR 62 | Ht 68.0 in | Wt 186.8 lb

## 2024-02-27 DIAGNOSIS — E782 Mixed hyperlipidemia: Secondary | ICD-10-CM

## 2024-02-27 DIAGNOSIS — I5022 Chronic systolic (congestive) heart failure: Secondary | ICD-10-CM

## 2024-02-27 DIAGNOSIS — I10 Essential (primary) hypertension: Secondary | ICD-10-CM

## 2024-02-27 DIAGNOSIS — I25810 Atherosclerosis of coronary artery bypass graft(s) without angina pectoris: Secondary | ICD-10-CM

## 2024-02-27 DIAGNOSIS — I472 Ventricular tachycardia, unspecified: Secondary | ICD-10-CM

## 2024-02-27 NOTE — Progress Notes (Signed)
 Clinical Summary Paul [name] is a 73 y.o.male seen today for follow up of the following medical problems.    1. CAD/ICM/Chronic systolic HF - history of prior CABG in 1999 as reported below - from notes has not been able to afford entresto  and also intolerant to lasix  in the past, has used HCTZ as diuretic. Previous fatigue on coreg, has tolerated toprol . Gynecomastia on aldactone  now on eplerenone  - 03/2019 echo LVEF 30-35%, grade II dd -07/2014 cath as reported below  - ICD followed by EP  11/2021 echo: LVEF 30-35%, grade I dd, normal RV     - no chest pains, no SOB/DOE. No LE edema - compliant with meds - recently checked on prices of the jardiance and entresto  per his check   2. History of VT - followed by Dr Nunzio Belch - has been on sotalol , has ICD in place - amio allergy    - no recent palpitations - 12/2023 device check was normal - QTC is 480 today     3. 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, football and basketball Past Medical History:  Diagnosis Date   Basal cell carcinoma 07/17/2017   nod-L cheek (CX35FU)   BPH (benign prostatic hyperplasia)    CAD (coronary artery disease)    widely patent LAD LIMA graft, widely patent PDA PLR SVG, occluded Diag 1 OM 1 SVG by cardiac CT 2007    CHF (congestive heart failure) (HCC)    ECHO 03/2019: EF 30-35%   Fatty liver    GERD (gastroesophageal reflux disease)    Hyperlipidemia    IBS (irritable bowel syndrome)    ICD (implantable cardioverter-defibrillator) battery depletion    Ischemic cardiomyopathy    Prior inferior infarct EF 35% by ECHO 07/13/11 ; ECHO 03/2019: EF 30-35%   S/P CABG (coronary artery bypass graft)    CABG 06/1998 [place], [place] with LIMA to LAD, SVG to OM, SVG to PD-PL.   OM  graft occluded 10/12/2005 by CTA    SCC (squamous cell carcinoma) 11/11/2016   in situ- R anti helix (CX35FU), well diff-R upper crust of helix (CX35FU)   Sleep apnea    Ventricular tachycardia (HCC) 10/15   VT at 188 bpm- unstable requiring external defibrillation     Allergies  Allergen Reactions   Amiodarone      Thyroid  issues   Carvedilol     Fatigue    Escitalopram Oxalate     unknown   Lasix  [Furosemide ]     Stomach upset and nausea   Niacin And Related     Flushing    Penicillins     Can take amoxicillin , and azithromycin but all others "upset my stomach"   Spironolactone      gynecomastia     Current Outpatient Medications  Medication Sig Dispense Refill   Ascorbic Acid (VITAMIN C) 1000 MG tablet Take 1,000 mg by mouth daily.     atorvastatin  (LIPITOR ) 80 MG tablet TAKE (1) TABLET BY MOUTH AT BEDTIME. 90 tablet 1   benzonatate  (TESSALON ) 200 MG capsule Take 1 capsule (200 mg total) by mouth 3 (three) times daily  as needed for cough. 30 capsule 0   cetirizine (ZYRTEC) 10 MG tablet Take 10 mg by mouth daily.     clopidogrel  (PLAVIX ) 75 MG tablet Take 1 tablet (75 mg total) by mouth daily. 90 tablet 2   Coenzyme Q10 50 MG CAPS Take 1 capsule by mouth daily.     eplerenone  (INSPRA ) 25 MG tablet TAKE ONE TABLET BY MOUTH ONCE DAILY. 90 tablet 2   fenofibrate  160 MG tablet Take 160 mg by mouth daily.     fluticasone  (FLONASE ) 50 MCG/ACT nasal spray Place 2 sprays into both nostrils daily. (Patient taking differently: Place 2 sprays into both nostrils as needed.) 16 g 6   folic acid  (FOLVITE ) 800 MCG tablet Take 800 mcg by mouth daily.      glycopyrrolate (ROBINUL) 2 MG tablet Take 2 mg by mouth 2 (two) times daily.     hydrochlorothiazide  (MICROZIDE ) 12.5 MG capsule TAKE (1) CAPSULE BY MOUTH ONCE DAILY AS NEEDED *FOR WEIGHT GAIN 3 LBS OR MORE IN 24 HOURS / 5 LBS A WEEK / SWELLING* 30 capsule 5   MAGNESIUM  GLYCINATE PO Take 200 mg by mouth in the morning and at bedtime.      Melatonin 5 MG TABS Take 5 mg by mouth at bedtime.      metoprolol  succinate (TOPROL -XL) 50 MG 24 hr tablet Take 1 tablet (50 mg total) by mouth daily. 90 tablet 2   nitroGLYCERIN  (NITROSTAT ) 0.4 MG SL tablet Place 1 tablet (0.4 mg total) under the tongue every 5 (five) minutes x 3 doses as needed for chest pain (if no relief after 2nd dose, proceed to ED or call 911). 25 tablet 3   Omega-3 Fatty Acids (FISH OIL) 1000 MG CAPS Take 1 capsule by mouth daily.     OVER THE COUNTER MEDICATION 1,250 mg daily. Extra Virgin Olive Oil. Gel capsule.     pantoprazole  (PROTONIX ) 20 MG tablet Take 20 mg by mouth daily.     ramipril  (ALTACE ) 10 MG capsule Take 1 capsule (10 mg total) by mouth daily. 90 capsule 1   sildenafil  (VIAGRA ) 50 MG tablet Take 1 tablet (50 mg total) by mouth daily as needed for erectile dysfunction. 20 tablet 3   sotalol  (BETAPACE ) 80 MG tablet Take 1 tablet (80 mg total) by mouth 2 (two) times daily. 180 tablet 3   Vitamin D, Cholecalciferol, 25 MCG (1000 UT) CAPS Take by mouth.      Vitamin E 400 UNITS TABS Take 400 Units by mouth daily.      No current facility-administered medications for this visit.     Past Surgical History:  Procedure Laterality Date   APPENDECTOMY     COLONOSCOPY  06/11/2010   Paul [name]; mild nonspecific erythematous mucosa in the descending colon s/p biopsied, diverticulosis, internal hemorrhoids.  Pathology with focal active colitis, nonspecific, main consideration includes acute infectious colitis.  NSAID induced colitis and/or Crohn's disease also in the differential but less likely.   CORONARY ARTERY BYPASS GRAFT  1999   IMPLANTABLE CARDIOVERTER DEFIBRILLATOR IMPLANT N/A 07/31/2014   MDT Zelma Hidden XT DR ICD implanted by Dr Nunzio Belch for secondary treatent of VT   LEFT HEART CATHETERIZATION WITH CORONARY/GRAFT ANGIOGRAM N/A 07/30/2014   Procedure: LEFT HEART CATHETERIZATION WITH Estella Helling;  Surgeon: Dorsey Gault, MD;  Location: [facility] CATH  LAB;  Service: Cardiovascular;  Laterality: N/A;     Allergies  Allergen Reactions   Amiodarone      Thyroid  issues   Carvedilol  Fatigue    Escitalopram Oxalate     unknown   Lasix  [Furosemide ]     Stomach upset and nausea   Niacin And Related     Flushing    Penicillins     Can take amoxicillin , and azithromycin but all others "upset my stomach"   Spironolactone      gynecomastia      Family History  Problem Relation Age of Onset   Dementia Mother    Cerebral aneurysm Father    Pneumonia Brother    Stroke Maternal Aunt    Diabetes Neg Hx    Heart attack Neg Hx    Hypertension Neg Hx    Colon cancer Neg Hx      Social History Mr. Mccrillis reports that he has never smoked. He has never used smokeless tobacco. Mr. Hajj reports current alcohol use of about 2.0 standard drinks of alcohol per week.    Physical Examination Today's Vitals   02/27/24 1256  BP: 130/74  Pulse: 62  SpO2: 98%  Weight: 186 lb 12.8 oz (84.7 kg)  Height: 5\' 8"  (1.727 m)   Body mass index is 28.4 kg/m.  Gen: resting comfortably, no acute distress HEENT: no scleral icterus, pupils equal round and reactive, no palptable cervical adenopathy,  CV: RRR, no m/rg, no jvd Resp: Clear to auscultation bilaterally GI: abdomen is soft, non-tender, non-distended, normal bowel sounds, no hepatosplenomegaly MSK: extremities are warm, no edema.  Skin: warm, no rash Neuro:  no focal deficits Psych: appropriate affect   Diagnostic Studies   03/2019 echo IMPRESSIONS     1. Stage 1: 1: Basal inferolateral segment, mid anterolateral segment,  mid inferolateral segment, and basal inferior segment are abnormal.   2. The left ventricle has moderate-severely reduced systolic function,  with an ejection fraction of 30-35%. The cavity size was severely dilated.  Left ventricular diastolic Doppler parameters are consistent with  pseudonormalization.   3. Left atrial size was mild-moderately  dilated.   4. The mitral valve is grossly normal.   5. The tricuspid valve is grossly normal.   6. The aortic valve is tricuspid. Mild thickening of the aortic valve.  Mild calcification of the aortic valve.   7. There is mild dilatation of the aortic root measuring 37 mm.    07/2014 cath IMPRESSIONS:   Severe native two-vessel coronary artery disease with occlusion of the mid LAD, severe stenosis prior to a moderate-sized diagonal Paul [name] and moderate disease prior to the circumflex marginal Paul [name] of the circumflex. Occluded right coronary artery distally with a severe proximal stenosis.  Patent saphenous vein graft to place the right coronary artery and patent mammary graft to LAD, previously occluded vein graft to the marginal system Potential sites of ischemia are the distal posterolateral Paul [name] of the right coronary artery and moderate size diagonal Paul [name]   11/2021 echo 1. Left ventricular ejection fraction, by estimation, is 30 to 35%. The  left ventricle has moderately decreased function. The left ventricle  demonstrates regional wall motion abnormalities (see scoring  diagram/findings for description). The left  ventricular internal cavity size was mildly to moderately dilated. Left  ventricular diastolic parameters are consistent with Grade I diastolic  dysfunction (impaired relaxation).   2. Right ventricular systolic function is normal. The right ventricular  size is normal. There is normal pulmonary artery systolic pressure. The  estimated right ventricular systolic pressure is 27.4 mmHg.   3. Left atrial size was moderately dilated.   4. The mitral valve is grossly  normal. Mild mitral valve regurgitation.   5. The aortic valve is tricuspid. Aortic valve regurgitation is not  visualized. No aortic stenosis is present. Aortic valve mean gradient  measures 3.5 mmHg.   6. The inferior vena cava is normal in size with greater than 50%  respiratory variability, suggesting right  atrial pressure of 3 mmHg.    Assessment and Plan   1. CAD/ICM/Chronic systolic HF - medical therapy limited by cost, he checked again on costs of entresto  and jardiance with his insurance and remains too expensive.   - no recent symptoms, he is euvolemic today - continue current meds   2. History of VT - followed by EP, on sotalol  and has ICD - recent normal device check - no symptoms, continue to monitor   3. HTN -bp is at goal, continue current meds   4. Hyperlipidemia -lipids are at goal,he will continue current meds    Laurann Pollock, M.D.

## 2024-02-27 NOTE — Patient Instructions (Signed)
 Medication Instructions:  Continue all current medications.   Labwork: none  Testing/Procedures: none  Follow-Up: 6 months   Any Other Special Instructions Will Be Listed Below (If Applicable).   If you need a refill on your cardiac medications before your next appointment, please call your pharmacy.

## 2024-03-09 ENCOUNTER — Other Ambulatory Visit: Payer: Self-pay | Admitting: Cardiology

## 2024-03-11 ENCOUNTER — Ambulatory Visit: Attending: Internal Medicine

## 2024-03-11 DIAGNOSIS — Z95810 Presence of automatic (implantable) cardiac defibrillator: Secondary | ICD-10-CM

## 2024-03-11 DIAGNOSIS — I5022 Chronic systolic (congestive) heart failure: Secondary | ICD-10-CM | POA: Diagnosis not present

## 2024-03-13 NOTE — Progress Notes (Signed)
 EPIC Encounter for ICM Monitoring  Patient Name: Paul [name] is a 73 y.o. male Date: 03/13/2024 Primary Care Physican: Emaline Handsome, MD Primary Cardiologist: Branch Electrophysiologist:  Carolynne Citron 06/09/2023 Weight: 181 lbs 07/13/2023 Weight: 181.8 lbs 09/19/2023 Weight: 181 lbs 10/27/2023 Weight: 182.5 lbs 02/07/2024 Weight: 181-182 lbs 03/13/2024 Weight: 182 lbs   Time in AT/AF   0.0 hr/day (0.0%)   Battery Longevity: 9 months          Spoke with patient and heart failure questions reviewed.  Transmission results reviewed.  Pt asymptomatic for fluid accumulation.  Reports feeling well at this time and voices no complaints.     Optivol thoracic impedance suggesting normal fluid levels within the last month.   Prescribed: Hydrochlorothiazide  12.5 mg Take 1 capsule (12.5 mg total) by mouth as needed (weight gain 3 lbs or more in 24 hours, 5 lbs in a week or swelling).     Labs: 05/08/2023 Creatinine 1.29, BUN 15, Potassium 4.7, Sodium 131, GFR 59 A complete set of results can be found in Results Review.   Recommendations:  No changes and encouraged to call if experiencing any fluid symptoms.   Follow-up plan: ICM clinic phone appointment on 05/13/2024.  91 day device clinic remote transmission 04/03/2024.     EP/Cardiology Office Visits:  Recall 08/25/2024 with Dr Amanda Jungling.   Recall 09/08/2024 with Dr Carolynne Citron.     Copy of ICM check sent to Dr. Carolynne Citron.   3 month ICM trend: 03/11/2024.    12-14 Month ICM trend:     Almyra Jain, RN 03/13/2024 3:02 PM

## 2024-03-22 ENCOUNTER — Other Ambulatory Visit: Payer: Self-pay

## 2024-03-22 ENCOUNTER — Encounter: Payer: Self-pay | Admitting: Cardiology

## 2024-03-22 MED ORDER — EPLERENONE 25 MG PO TABS: 25.0000 mg | ORAL_TABLET | Freq: Every day | ORAL | 2 refills | Status: AC

## 2024-04-03 ENCOUNTER — Ambulatory Visit (INDEPENDENT_AMBULATORY_CARE_PROVIDER_SITE_OTHER): Payer: Medicare Other

## 2024-04-03 DIAGNOSIS — I472 Ventricular tachycardia, unspecified: Secondary | ICD-10-CM

## 2024-04-03 LAB — CUP PACEART REMOTE DEVICE CHECK
Battery Remaining Longevity: 6 mo
Battery Voltage: 2.86 V
Brady Statistic AP VP Percent: 0.01 %
Brady Statistic AP VS Percent: 6.67 %
Brady Statistic AS VP Percent: 0.09 %
Brady Statistic AS VS Percent: 93.23 %
Brady Statistic RA Percent Paced: 6.63 %
Brady Statistic RV Percent Paced: 0.1 %
Date Time Interrogation Session: 20250611073725
HighPow Impedance: 66 Ohm
Implantable Lead Connection Status: 753985
Implantable Lead Connection Status: 753985
Implantable Lead Implant Date: 20151008
Implantable Lead Implant Date: 20151008
Implantable Lead Location: 753859
Implantable Lead Location: 753860
Implantable Lead Model: 5076
Implantable Lead Model: 6935
Implantable Pulse Generator Implant Date: 20151008
Lead Channel Impedance Value: 285 Ohm
Lead Channel Impedance Value: 342 Ohm
Lead Channel Impedance Value: 418 Ohm
Lead Channel Pacing Threshold Amplitude: 1.25 V
Lead Channel Pacing Threshold Amplitude: 1.875 V
Lead Channel Pacing Threshold Pulse Width: 0.4 ms
Lead Channel Pacing Threshold Pulse Width: 0.4 ms
Lead Channel Sensing Intrinsic Amplitude: 1.375 mV
Lead Channel Sensing Intrinsic Amplitude: 1.375 mV
Lead Channel Sensing Intrinsic Amplitude: 8.125 mV
Lead Channel Sensing Intrinsic Amplitude: 8.125 mV
Lead Channel Setting Pacing Amplitude: 2.5 V
Lead Channel Setting Pacing Amplitude: 2.5 V
Lead Channel Setting Pacing Pulse Width: 0.8 ms
Lead Channel Setting Sensing Sensitivity: 0.3 mV
Zone Setting Status: 755011
Zone Setting Status: 755011

## 2024-04-05 ENCOUNTER — Ambulatory Visit: Payer: Self-pay | Admitting: Internal Medicine

## 2024-05-11 ENCOUNTER — Encounter: Payer: Self-pay | Admitting: Cardiology

## 2024-05-13 ENCOUNTER — Ambulatory Visit: Attending: Internal Medicine

## 2024-05-13 ENCOUNTER — Other Ambulatory Visit: Payer: Self-pay

## 2024-05-13 DIAGNOSIS — Z95810 Presence of automatic (implantable) cardiac defibrillator: Secondary | ICD-10-CM

## 2024-05-13 DIAGNOSIS — I5022 Chronic systolic (congestive) heart failure: Secondary | ICD-10-CM

## 2024-05-13 MED ORDER — METOPROLOL SUCCINATE ER 50 MG PO TB24: 50.0000 mg | ORAL_TABLET | Freq: Every day | ORAL | 2 refills | Status: AC

## 2024-05-13 NOTE — Telephone Encounter (Signed)
 RX sent to requested Pharmacy

## 2024-05-16 NOTE — Progress Notes (Signed)
 EPIC Encounter for ICM Monitoring  Patient Name: Paul [name] is a 73 y.o. male Date: 05/16/2024 Primary Care Physican: Sophronia Ozell BROCKS, MD Primary Cardiologist: Branch Electrophysiologist:  Waddell 06/09/2023 Weight: 181 lbs 07/13/2023 Weight: 181.8 lbs 09/19/2023 Weight: 181 lbs 10/27/2023 Weight: 182.5 lbs 02/07/2024 Weight: 181-182 lbs 03/13/2024 Weight: 182 lbs 05/16/2024 Weight:  182-183 lbs   Time in AT/AF   0.0 hr/day (0.0%)   Battery Longevity: 8 months          Spoke with patient and heart failure questions reviewed.  Transmission results reviewed.  Pt asymptomatic for fluid accumulation.  Reports feeling well at this time and voices no complaints.     Optivol thoracic impedance suggesting normal fluid levels within the last month.   Prescribed: Hydrochlorothiazide  12.5 mg Take 1 capsule (12.5 mg total) by mouth as needed (weight gain 3 lbs or more in 24 hours, 5 lbs in a week or swelling).     Labs: 05/08/2023 Creatinine 1.29, BUN 15, Potassium 4.7, Sodium 131, GFR 59 A complete set of results can be found in Results Review.   Recommendations:  No changes and encouraged to call if experiencing any fluid symptoms.   Follow-up plan: ICM clinic phone appointment on 06/17/2024.  91 day device clinic remote transmission 07/03/2024.     EP/Cardiology Office Visits:  09/03/2024 with Dr Alvan.   09/10/2024 with Dr Waddell.     Copy of ICM check sent to Dr. Waddell.    3 month ICM trend: 05/13/2024.    12-14 Month ICM trend:     [name], RN 05/16/2024 2:48 PM

## 2024-05-30 NOTE — Progress Notes (Signed)
 Remote ICD transmission.

## 2024-05-30 NOTE — Addendum Note (Signed)
 Addended by: Torre Pikus A on: 05/30/2024 02:49 PM   Modules accepted: Orders

## 2024-06-17 ENCOUNTER — Ambulatory Visit: Attending: Internal Medicine

## 2024-06-17 DIAGNOSIS — Z95810 Presence of automatic (implantable) cardiac defibrillator: Secondary | ICD-10-CM | POA: Diagnosis not present

## 2024-06-17 DIAGNOSIS — I5022 Chronic systolic (congestive) heart failure: Secondary | ICD-10-CM

## 2024-06-20 NOTE — Progress Notes (Signed)
 EPIC Encounter for ICM Monitoring  Patient Name: Paul [name] is a 73 y.o. male Date: 06/20/2024 Primary Care Physican: Sophronia Ozell BROCKS, MD Primary Cardiologist: Branch Electrophysiologist:  Waddell 06/09/2023 Weight: 181 lbs 07/13/2023 Weight: 181.8 lbs 09/19/2023 Weight: 181 lbs 10/27/2023 Weight: 182.5 lbs 02/07/2024 Weight: 181-182 lbs 03/13/2024 Weight: 182 lbs 05/16/2024 Weight:  182-183 lbs 06/20/2024 Weight:  183-184 lbs   Time in AT/AF   0.0 hr/day (0.0%)   Battery Longevity: 6 months          Spoke with patient and heart failure questions reviewed.  Transmission results reviewed.  Pt asymptomatic for fluid accumulation.  Reports feeling well at this time and voices no complaints.  Unsure what may have caused decreased impedance.  Visiting new grandson in September.    Optivol thoracic impedance suggesting normal fluid levels with the exception of possible fluid accumulation from 8/4-8/14.   Prescribed: Hydrochlorothiazide  12.5 mg Take 1 capsule (12.5 mg total) by mouth as needed (weight gain 3 lbs or more in 24 hours, 5 lbs in a week or swelling).     Labs: 05/08/2023 Creatinine 1.29, BUN 15, Potassium 4.7, Sodium 131, GFR 59 A complete set of results can be found in Results Review.   Recommendations:  No changes and encouraged to call if experiencing any fluid symptoms.   Follow-up plan: ICM clinic phone appointment on 07/18/2024.  91 day device clinic remote transmission 07/03/2024.     EP/Cardiology Office Visits:  09/03/2024 with Dr Alvan.   09/10/2024 with Dr Waddell.     Copy of ICM check sent to Dr. Waddell.    3 month ICM trend: 06/17/2024.    12-14 Month ICM trend:     [name], RN 06/20/2024 3:31 PM

## 2024-07-03 ENCOUNTER — Ambulatory Visit (INDEPENDENT_AMBULATORY_CARE_PROVIDER_SITE_OTHER): Payer: Medicare Other

## 2024-07-03 DIAGNOSIS — I5022 Chronic systolic (congestive) heart failure: Secondary | ICD-10-CM

## 2024-07-03 LAB — CUP PACEART REMOTE DEVICE CHECK
Battery Remaining Longevity: 3 mo
Battery Voltage: 2.84 V
Brady Statistic AP VP Percent: 0.02 %
Brady Statistic AP VS Percent: 5.15 %
Brady Statistic AS VP Percent: 0.11 %
Brady Statistic AS VS Percent: 94.72 %
Brady Statistic RA Percent Paced: 5.15 %
Brady Statistic RV Percent Paced: 0.14 %
Date Time Interrogation Session: 20250910084345
HighPow Impedance: 71 Ohm
Implantable Lead Connection Status: 753985
Implantable Lead Connection Status: 753985
Implantable Lead Implant Date: 20151008
Implantable Lead Implant Date: 20151008
Implantable Lead Location: 753859
Implantable Lead Location: 753860
Implantable Lead Model: 5076
Implantable Lead Model: 6935
Implantable Pulse Generator Implant Date: 20151008
Lead Channel Impedance Value: 304 Ohm
Lead Channel Impedance Value: 361 Ohm
Lead Channel Impedance Value: 418 Ohm
Lead Channel Pacing Threshold Amplitude: 1.25 V
Lead Channel Pacing Threshold Amplitude: 2.125 V
Lead Channel Pacing Threshold Pulse Width: 0.4 ms
Lead Channel Pacing Threshold Pulse Width: 0.4 ms
Lead Channel Sensing Intrinsic Amplitude: 1.5 mV
Lead Channel Sensing Intrinsic Amplitude: 1.5 mV
Lead Channel Sensing Intrinsic Amplitude: 9.875 mV
Lead Channel Sensing Intrinsic Amplitude: 9.875 mV
Lead Channel Setting Pacing Amplitude: 2.5 V
Lead Channel Setting Pacing Amplitude: 2.5 V
Lead Channel Setting Pacing Pulse Width: 0.8 ms
Lead Channel Setting Sensing Sensitivity: 0.3 mV
Zone Setting Status: 755011
Zone Setting Status: 755011

## 2024-07-09 ENCOUNTER — Ambulatory Visit: Payer: Self-pay | Admitting: Internal Medicine

## 2024-07-12 ENCOUNTER — Telehealth: Payer: Self-pay

## 2024-07-12 NOTE — Telephone Encounter (Signed)
 ICM call to patient.  Advised will need to reschedule 9/25 ICM follow up.  Remote transmission rescheduled for 08/05/2024.

## 2024-07-12 NOTE — Progress Notes (Signed)
Remote ICD Transmission.

## 2024-07-18 ENCOUNTER — Encounter

## 2024-08-01 ENCOUNTER — Encounter

## 2024-08-01 ENCOUNTER — Ambulatory Visit

## 2024-08-02 LAB — CUP PACEART REMOTE DEVICE CHECK
Battery Remaining Longevity: 5 mo
Battery Voltage: 2.84 V
Brady Statistic AP VP Percent: 0.02 %
Brady Statistic AP VS Percent: 5.74 %
Brady Statistic AS VP Percent: 0.12 %
Brady Statistic AS VS Percent: 94.12 %
Brady Statistic RA Percent Paced: 5.74 %
Brady Statistic RV Percent Paced: 0.14 %
Date Time Interrogation Session: 20251010101602
HighPow Impedance: 73 Ohm
Implantable Lead Connection Status: 753985
Implantable Lead Connection Status: 753985
Implantable Lead Implant Date: 20151008
Implantable Lead Implant Date: 20151008
Implantable Lead Location: 753859
Implantable Lead Location: 753860
Implantable Lead Model: 5076
Implantable Lead Model: 6935
Implantable Pulse Generator Implant Date: 20151008
Lead Channel Impedance Value: 285 Ohm
Lead Channel Impedance Value: 342 Ohm
Lead Channel Impedance Value: 418 Ohm
Lead Channel Pacing Threshold Amplitude: 1.25 V
Lead Channel Pacing Threshold Amplitude: 1.5 V
Lead Channel Pacing Threshold Pulse Width: 0.4 ms
Lead Channel Pacing Threshold Pulse Width: 0.4 ms
Lead Channel Sensing Intrinsic Amplitude: 1.375 mV
Lead Channel Sensing Intrinsic Amplitude: 1.375 mV
Lead Channel Sensing Intrinsic Amplitude: 8.875 mV
Lead Channel Sensing Intrinsic Amplitude: 8.875 mV
Lead Channel Setting Pacing Amplitude: 2.5 V
Lead Channel Setting Pacing Amplitude: 2.5 V
Lead Channel Setting Pacing Pulse Width: 0.8 ms
Lead Channel Setting Sensing Sensitivity: 0.3 mV
Zone Setting Status: 755011
Zone Setting Status: 755011

## 2024-08-05 ENCOUNTER — Ambulatory Visit: Attending: Internal Medicine

## 2024-08-05 DIAGNOSIS — Z95810 Presence of automatic (implantable) cardiac defibrillator: Secondary | ICD-10-CM | POA: Diagnosis not present

## 2024-08-05 DIAGNOSIS — I5022 Chronic systolic (congestive) heart failure: Secondary | ICD-10-CM

## 2024-08-07 ENCOUNTER — Ambulatory Visit: Payer: Self-pay | Admitting: Internal Medicine

## 2024-08-09 NOTE — Progress Notes (Signed)
 EPIC Encounter for ICM Monitoring  Patient Name: Paul [name] is a 73 y.o. male Date: 08/09/2024 Primary Care Physican: Sophronia Ozell BROCKS, MD Primary Cardiologist: Branch Electrophysiologist:  Waddell 06/09/2023 Weight: 181 lbs 07/13/2023 Weight: 181.8 lbs 09/19/2023 Weight: 181 lbs 10/27/2023 Weight: 182.5 lbs 02/07/2024 Weight: 181-182 lbs 03/13/2024 Weight: 182 lbs 05/16/2024 Weight:  182-183 lbs 06/20/2024 Weight:  183-184 lbs   Time in AT/AF   0.0 hr/day (0.0%)   Battery Longevity: 5 months          Spoke with patient and heart failure questions reviewed.  Transmission results reviewed.  Pt asymptomatic for fluid accumulation.  Reports feeling well at this time and voices no complaints.  Pt was on vacation during decreased impedance.     Optivol thoracic impedance suggesting normal fluid levels with the exception of possible fluid accumulation from 07/22/2024-07/28/2024.   Prescribed: Hydrochlorothiazide  12.5 mg Take 1 capsule (12.5 mg total) by mouth as needed (weight gain 3 lbs or more in 24 hours, 5 lbs in a week or swelling).     Labs: 07/19/2024 Creatinine 1.26, BUN 11, Potassium 5.0, Sodium 135, GFR 60 Care Everywhere  01/29/2024 Creatinine 1.25, BUN 10, Potassium 4.8, Sodium 135, GFR 61 Care Everywhere  A complete set of results can be found in Results Review.   Recommendations:  No changes and encouraged to call if experiencing any fluid symptoms.   Follow-up plan: ICM clinic phone appointment on 09/23/2024.  91 day device clinic remote transmission 10/02/2024.     EP/Cardiology Office Visits:  09/03/2024 with Dr Alvan.   09/10/2024 with Dr Waddell.     Copy of ICM check sent to Dr. Waddell.    Remote monitoring is medically necessary for Heart Failure Management.    Daily Thoracic Impedance ICM trend: 05/06/2024 through 08/05/2024.    12-14 Month Thoracic Impedance ICM trend:     [name], RN 08/09/2024 11:57 AM

## 2024-09-01 ENCOUNTER — Encounter

## 2024-09-01 LAB — CUP PACEART REMOTE DEVICE CHECK
Battery Remaining Longevity: 5 mo
Battery Voltage: 2.8 V
Brady Statistic AP VP Percent: 0.02 %
Brady Statistic AP VS Percent: 4.17 %
Brady Statistic AS VP Percent: 0.1 %
Brady Statistic AS VS Percent: 95.72 %
Brady Statistic RA Percent Paced: 4.16 %
Brady Statistic RV Percent Paced: 0.12 %
Date Time Interrogation Session: 20251109043724
HighPow Impedance: 71 Ohm
Implantable Lead Connection Status: 753985
Implantable Lead Connection Status: 753985
Implantable Lead Implant Date: 20151008
Implantable Lead Implant Date: 20151008
Implantable Lead Location: 753859
Implantable Lead Location: 753860
Implantable Lead Model: 5076
Implantable Lead Model: 6935
Implantable Pulse Generator Implant Date: 20151008
Lead Channel Impedance Value: 285 Ohm
Lead Channel Impedance Value: 361 Ohm
Lead Channel Impedance Value: 418 Ohm
Lead Channel Pacing Threshold Amplitude: 1.25 V
Lead Channel Pacing Threshold Amplitude: 2.25 V
Lead Channel Pacing Threshold Pulse Width: 0.4 ms
Lead Channel Pacing Threshold Pulse Width: 0.4 ms
Lead Channel Sensing Intrinsic Amplitude: 1.25 mV
Lead Channel Sensing Intrinsic Amplitude: 1.25 mV
Lead Channel Sensing Intrinsic Amplitude: 8 mV
Lead Channel Sensing Intrinsic Amplitude: 8 mV
Lead Channel Setting Pacing Amplitude: 2.5 V
Lead Channel Setting Pacing Amplitude: 2.5 V
Lead Channel Setting Pacing Pulse Width: 0.8 ms
Lead Channel Setting Sensing Sensitivity: 0.3 mV
Zone Setting Status: 755011
Zone Setting Status: 755011

## 2024-09-02 ENCOUNTER — Encounter

## 2024-09-02 ENCOUNTER — Ambulatory Visit: Payer: Self-pay | Admitting: Internal Medicine

## 2024-09-03 ENCOUNTER — Encounter: Payer: Self-pay | Admitting: Cardiology

## 2024-09-03 ENCOUNTER — Ambulatory Visit: Attending: Cardiology | Admitting: Cardiology

## 2024-09-03 VITALS — BP 122/62 | HR 71 | Ht 68.0 in | Wt 193.2 lb

## 2024-09-03 DIAGNOSIS — I5022 Chronic systolic (congestive) heart failure: Secondary | ICD-10-CM

## 2024-09-03 DIAGNOSIS — E782 Mixed hyperlipidemia: Secondary | ICD-10-CM

## 2024-09-03 DIAGNOSIS — I25810 Atherosclerosis of coronary artery bypass graft(s) without angina pectoris: Secondary | ICD-10-CM

## 2024-09-03 DIAGNOSIS — I472 Ventricular tachycardia, unspecified: Secondary | ICD-10-CM | POA: Diagnosis not present

## 2024-09-03 DIAGNOSIS — I10 Essential (primary) hypertension: Secondary | ICD-10-CM | POA: Diagnosis not present

## 2024-09-03 NOTE — Progress Notes (Signed)
 Clinical Summary Paul [name] is a 73 y.o.male seen today for follow up of the following medical problems.    1. CAD/ICM/Chronic systolic HF - history of prior CABG in 1999 as reported below - from notes has not been able to afford entresto  and also intolerant to lasix  in the past, has used HCTZ as diuretic. Previous fatigue on coreg, has tolerated toprol . Gynecomastia on aldactone  now on eplerenone  - 03/2019 echo LVEF 30-35%, grade II dd -07/2014 cath as reported below  - ICD followed by EP  11/2021 echo: LVEF 30-35%, grade I dd, normal RV       - 08/2024 normal device check.  - no chest pains, no SOB/DOE - compliant with meds      2. History of VT - followed by Dr Kelsie - has been on sotalol , has ICD in place - amio allergy    - no palpitations, no presyncope or syncope - 08/2024 normal ICD device check.      3. 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, football and basketball   Past Medical History:  Diagnosis Date   Basal cell carcinoma 07/17/2017   nod-L cheek (CX35FU)   BPH (benign prostatic hyperplasia)    CAD (coronary artery disease)    widely patent LAD LIMA graft, widely patent PDA PLR SVG, occluded Diag 1 OM 1 SVG by cardiac CT 2007    CHF (congestive heart failure) (HCC)    ECHO 03/2019: EF 30-35%   Fatty liver    GERD (gastroesophageal reflux disease)    Hyperlipidemia    IBS (irritable bowel syndrome)    ICD (implantable cardioverter-defibrillator) battery depletion    Ischemic cardiomyopathy    Prior inferior infarct EF 35% by ECHO 07/13/11 ; ECHO 03/2019: EF 30-35%   S/P CABG (coronary artery bypass graft)    CABG 06/1998 [place], [place] with LIMA to LAD, SVG to OM, SVG to PD-PL.   OM graft occluded 10/12/2005 by CTA    SCC (squamous cell carcinoma) 11/11/2016   in situ- R anti helix (CX35FU), well diff-R upper crust of helix (CX35FU)    Sleep apnea    Ventricular tachycardia (HCC) 10/15   VT at 188 bpm- unstable requiring external defibrillation     Allergies  Allergen Reactions   Amiodarone      Thyroid  issues   Carvedilol     Fatigue    Escitalopram Oxalate     unknown   Lasix  [Furosemide ]     Stomach upset and nausea   Niacin And Related     Flushing    Penicillins     Can take amoxicillin , and azithromycin but all others upset my stomach   Spironolactone      gynecomastia     Current Outpatient Medications  Medication Sig Dispense Refill   Ascorbic Acid (VITAMIN C) 1000 MG tablet Take 1,000 mg by mouth daily.     atorvastatin  (LIPITOR ) 80 MG tablet TAKE (1) TABLET BY MOUTH AT BEDTIME. 90 tablet 1   augmented betamethasone dipropionate (DIPROLENE-AF) 0.05 % cream Apply topically daily as needed.     benzonatate  (TESSALON ) 200 MG capsule Take 1 capsule (200 mg total) by mouth 3 (three) times daily as needed for cough. 30 capsule 0   cetirizine (ZYRTEC) 10 MG tablet Take 10 mg by mouth daily.  clopidogrel  (PLAVIX ) 75 MG tablet Take 1 tablet (75 mg total) by mouth daily. 90 tablet 2   Coenzyme Q10 50 MG CAPS Take 1 capsule by mouth daily.     eplerenone  (INSPRA ) 25 MG tablet Take 1 tablet (25 mg total) by mouth daily. 90 tablet 2   fenofibrate  160 MG tablet Take 160 mg by mouth daily.     fluticasone  (FLONASE ) 50 MCG/ACT nasal spray Place 2 sprays into both nostrils daily. (Patient taking differently: Place 2 sprays into both nostrils as needed.) 16 g 6   folic acid  (FOLVITE ) 800 MCG tablet Take 800 mcg by mouth daily.      glycopyrrolate (ROBINUL) 2 MG tablet Take 2 mg by mouth 2 (two) times daily.     hydrochlorothiazide  (MICROZIDE ) 12.5 MG capsule TAKE (1) CAPSULE BY MOUTH ONCE DAILY AS NEEDED *FOR WEIGHT GAIN 3 LBS OR MORE IN 24 HOURS / 5 LBS A WEEK / SWELLING* 30 capsule 5   MAGNESIUM  GLYCINATE PO Take 200 mg by mouth in the morning and at bedtime.     Melatonin 5 MG TABS Take 5 mg by mouth at  bedtime.      metoprolol  succinate (TOPROL -XL) 50 MG 24 hr tablet Take 1 tablet (50 mg total) by mouth daily. 90 tablet 2   nitroGLYCERIN  (NITROSTAT ) 0.4 MG SL tablet Place 1 tablet (0.4 mg total) under the tongue every 5 (five) minutes x 3 doses as needed for chest pain (if no relief after 2nd dose, proceed to ED or call 911). 25 tablet 3   Omega-3 Fatty Acids (FISH OIL) 1000 MG CAPS Take 1 capsule by mouth daily.     OVER THE COUNTER MEDICATION 1,250 mg daily. Extra Virgin Olive Oil. Gel capsule.     pantoprazole  (PROTONIX ) 20 MG tablet Take 20 mg by mouth daily.     ramipril  (ALTACE ) 10 MG capsule Take 1 capsule (10 mg total) by mouth daily. 90 capsule 1   sildenafil  (VIAGRA ) 50 MG tablet Take 1 tablet (50 mg total) by mouth daily as needed for erectile dysfunction. 20 tablet 3   sotalol  (BETAPACE ) 80 MG tablet Take 1 tablet (80 mg total) by mouth 2 (two) times daily. 180 tablet 3   Vitamin D, Cholecalciferol, 25 MCG (1000 UT) CAPS Take by mouth.      Vitamin E 400 UNITS TABS Take 400 Units by mouth daily.      No current facility-administered medications for this visit.     Past Surgical History:  Procedure Laterality Date   APPENDECTOMY     COLONOSCOPY  06/11/2010   Jolynn Pack; mild nonspecific erythematous mucosa in the descending colon s/p biopsied, diverticulosis, internal hemorrhoids.  Pathology with focal active colitis, nonspecific, main consideration includes acute infectious colitis.  NSAID induced colitis and/or Crohn's disease also in the differential but less likely.   CORONARY ARTERY BYPASS GRAFT  1999   IMPLANTABLE CARDIOVERTER DEFIBRILLATOR IMPLANT N/A 07/31/2014   MDT Sylvan XT DR ICD implanted by Dr Kelsie for secondary treatent of VT   LEFT HEART CATHETERIZATION WITH CORONARY/GRAFT ANGIOGRAM N/A 07/30/2014   Procedure: LEFT HEART CATHETERIZATION WITH EL BILE;  Surgeon: Elsie GORMAN Somerset, MD;  Location: [facility] CATH LAB;  Service: Cardiovascular;  Laterality:  N/A;     Allergies  Allergen Reactions   Amiodarone      Thyroid  issues   Carvedilol     Fatigue    Escitalopram Oxalate     unknown   Lasix  [Furosemide ]     Stomach  upset and nausea   Niacin And Related     Flushing    Penicillins     Can take amoxicillin , and azithromycin but all others upset my stomach   Spironolactone      gynecomastia      Family History  Problem Relation Age of Onset   Dementia Mother    Cerebral aneurysm Father    Pneumonia Brother    Stroke Maternal Aunt    Diabetes Neg Hx    Heart attack Neg Hx    Hypertension Neg Hx    Colon cancer Neg Hx      Social History Paul [name] reports that he has never smoked. He has never used smokeless tobacco. Paul [name] reports current alcohol use of about 2.0 standard drinks of alcohol per week.    Physical Examination Today's Vitals   09/03/24 1339  BP: 122/62  Pulse: 71  SpO2: 97%  Weight: 193 lb 3.2 oz (87.6 kg)  Height: 5' 8 (1.727 m)   Body mass index is 29.38 kg/m.  Gen: resting comfortably, no acute distress HEENT: no scleral icterus, pupils equal round and reactive, no palptable cervical adenopathy,  CV: RRR, no m/rg, no jvd Resp: Clear to auscultation bilaterally GI: abdomen is soft, non-tender, non-distended, normal bowel sounds, no hepatosplenomegaly MSK: extremities are warm, no edema.  Skin: warm, no rash Neuro:  no focal deficits Psych: appropriate affect   Diagnostic Studies 03/2019 echo IMPRESSIONS     1. Stage 1: 1: Basal inferolateral segment, mid anterolateral segment,  mid inferolateral segment, and basal inferior segment are abnormal.   2. The left ventricle has moderate-severely reduced systolic function,  with an ejection fraction of 30-35%. The cavity size was severely dilated.  Left ventricular diastolic Doppler parameters are consistent with  pseudonormalization.   3. Left atrial size was mild-moderately dilated.   4. The mitral valve is grossly normal.    5. The tricuspid valve is grossly normal.   6. The aortic valve is tricuspid. Mild thickening of the aortic valve.  Mild calcification of the aortic valve.   7. There is mild dilatation of the aortic root measuring 37 mm.    07/2014 cath IMPRESSIONS:   Severe native two-vessel coronary artery disease with occlusion of the mid LAD, severe stenosis prior to a moderate-sized diagonal Sirenia Whitis and moderate disease prior to the circumflex marginal Lauriann Milillo of the circumflex. Occluded right coronary artery distally with a severe proximal stenosis.  Patent saphenous vein graft to place the right coronary artery and patent mammary graft to LAD, previously occluded vein graft to the marginal system Potential sites of ischemia are the distal posterolateral Xara Paulding of the right coronary artery and moderate size diagonal Tifanny Dollens   11/2021 echo 1. Left ventricular ejection fraction, by estimation, is 30 to 35%. The  left ventricle has moderately decreased function. The left ventricle  demonstrates regional wall motion abnormalities (see scoring  diagram/findings for description). The left  ventricular internal cavity size was mildly to moderately dilated. Left  ventricular diastolic parameters are consistent with Grade I diastolic  dysfunction (impaired relaxation).   2. Right ventricular systolic function is normal. The right ventricular  size is normal. There is normal pulmonary artery systolic pressure. The  estimated right ventricular systolic pressure is 27.4 mmHg.   3. Left atrial size was moderately dilated.   4. The mitral valve is grossly normal. Mild mitral valve regurgitation.   5. The aortic valve is tricuspid. Aortic valve regurgitation is not  visualized. No aortic  stenosis is present. Aortic valve mean gradient  measures 3.5 mmHg.   6. The inferior vena cava is normal in size with greater than 50%  respiratory variability, suggesting right atrial pressure of 3 mmHg.    Assessment and Plan   1. CAD/ICM/Chronic systolic HF - medical therapy limited by cost, he checked again on costs of entresto  and jardiance with his insurance and remains too expensive.    -no symptoms, continue current meds   2. History of VT - followed by EP, on sotalol  and has ICD - normal device check earlier this month - has EP f/u next week - continue current therapy. EKG today SR, QTc 480   3. HTN -at goal, continue current meds   4. Hyperlipidemia -lipids are at goal, continue current meds     Dorn PHEBE Ross, M.D.

## 2024-09-03 NOTE — Patient Instructions (Signed)
 Medication Instructions:  Continue all current medications.   Labwork: none  Testing/Procedures: none  Follow-Up: 6 months   Any Other Special Instructions Will Be Listed Below (If Applicable).   If you need a refill on your cardiac medications before your next appointment, please call your pharmacy.

## 2024-09-10 ENCOUNTER — Ambulatory Visit: Attending: Internal Medicine | Admitting: Internal Medicine

## 2024-09-10 ENCOUNTER — Encounter: Payer: Self-pay | Admitting: Internal Medicine

## 2024-09-10 VITALS — BP 126/78 | HR 66 | Ht 68.0 in | Wt 190.6 lb

## 2024-09-10 DIAGNOSIS — I5022 Chronic systolic (congestive) heart failure: Secondary | ICD-10-CM

## 2024-09-10 LAB — CUP PACEART INCLINIC DEVICE CHECK
Date Time Interrogation Session: 20251118211906
Eval Rhythm: 4
Implantable Lead Connection Status: 753985
Implantable Lead Connection Status: 753985
Implantable Lead Implant Date: 20151008
Implantable Lead Implant Date: 20151008
Implantable Lead Location: 753859
Implantable Lead Location: 753860
Implantable Lead Model: 5076
Implantable Lead Model: 6935
Implantable Pulse Generator Implant Date: 20151008

## 2024-09-10 NOTE — Patient Instructions (Signed)

## 2024-09-10 NOTE — Progress Notes (Signed)
 HPI Mr. Proby returns for additional eval. He is a pleasant 73 y.o. male who had seen Dr. MILUS and then [facility] who now presents today for routine electrophysiology followup.  Since last being seen in our clinic, the patient reports doing very well. He denies chest pain or sob. No syncope. He denies any ICD therapies.  Allergies  Allergen Reactions   Amiodarone      Thyroid  issues   Carvedilol     Fatigue    Escitalopram Oxalate     unknown   Lasix  [Furosemide ]     Stomach upset and nausea   Niacin And Related     Flushing    Penicillins     Can take amoxicillin , and azithromycin but all others upset my stomach   Spironolactone      gynecomastia     Current Outpatient Medications  Medication Sig Dispense Refill   Ascorbic Acid (VITAMIN C) 1000 MG tablet Take 1,000 mg by mouth daily.     atorvastatin  (LIPITOR ) 80 MG tablet TAKE (1) TABLET BY MOUTH AT BEDTIME. 90 tablet 1   augmented betamethasone dipropionate (DIPROLENE-AF) 0.05 % cream Apply topically daily as needed.     benzonatate  (TESSALON ) 200 MG capsule Take 1 capsule (200 mg total) by mouth 3 (three) times daily as needed for cough. 30 capsule 0   cetirizine (ZYRTEC) 10 MG tablet Take 10 mg by mouth daily.     clopidogrel  (PLAVIX ) 75 MG tablet Take 1 tablet (75 mg total) by mouth daily. 90 tablet 2   Coenzyme Q10 50 MG CAPS Take 1 capsule by mouth daily.     eplerenone  (INSPRA ) 25 MG tablet Take 1 tablet (25 mg total) by mouth daily. 90 tablet 2   fenofibrate  160 MG tablet Take 160 mg by mouth daily.     fluticasone  (FLONASE ) 50 MCG/ACT nasal spray Place 2 sprays into both nostrils daily. (Patient taking differently: Place 2 sprays into both nostrils as needed.) 16 g 6   folic acid  (FOLVITE ) 800 MCG tablet Take 800 mcg by mouth daily.      glycopyrrolate (ROBINUL) 2 MG tablet Take 2 mg by mouth 2 (two) times daily.     hydrochlorothiazide  (MICROZIDE ) 12.5 MG capsule TAKE (1) CAPSULE BY MOUTH ONCE DAILY AS NEEDED  *FOR WEIGHT GAIN 3 LBS OR MORE IN 24 HOURS / 5 LBS A WEEK / SWELLING* 30 capsule 5   MAGNESIUM  GLYCINATE PO Take 200 mg by mouth in the morning and at bedtime.     Melatonin 5 MG TABS Take 5 mg by mouth at bedtime.      metoprolol  succinate (TOPROL -XL) 50 MG 24 hr tablet Take 1 tablet (50 mg total) by mouth daily. 90 tablet 2   nitroGLYCERIN  (NITROSTAT ) 0.4 MG SL tablet Place 1 tablet (0.4 mg total) under the tongue every 5 (five) minutes x 3 doses as needed for chest pain (if no relief after 2nd dose, proceed to ED or call 911). 25 tablet 3   Omega-3 Fatty Acids (FISH OIL) 1000 MG CAPS Take 1 capsule by mouth daily.     OVER THE COUNTER MEDICATION 1,250 mg daily. Extra Virgin Olive Oil. Gel capsule.     pantoprazole  (PROTONIX ) 20 MG tablet Take 20 mg by mouth daily.     ramipril  (ALTACE ) 10 MG capsule Take 1 capsule (10 mg total) by mouth daily. 90 capsule 1   sildenafil  (VIAGRA ) 50 MG tablet Take 1 tablet (50 mg total) by mouth daily as needed  for erectile dysfunction. 20 tablet 3   sotalol  (BETAPACE ) 80 MG tablet Take 1 tablet (80 mg total) by mouth 2 (two) times daily. 180 tablet 3   Vitamin D, Cholecalciferol, 25 MCG (1000 UT) CAPS Take by mouth.      Vitamin E 400 UNITS TABS Take 400 Units by mouth daily.      No current facility-administered medications for this visit.     Past Medical History:  Diagnosis Date   Basal cell carcinoma 07/17/2017   nod-L cheek (CX35FU)   BPH (benign prostatic hyperplasia)    CAD (coronary artery disease)    widely patent LAD LIMA graft, widely patent PDA PLR SVG, occluded Diag 1 OM 1 SVG by cardiac CT 2007    CHF (congestive heart failure) (HCC)    ECHO 03/2019: EF 30-35%   Fatty liver    GERD (gastroesophageal reflux disease)    Hyperlipidemia    IBS (irritable bowel syndrome)    ICD (implantable cardioverter-defibrillator) battery depletion    Ischemic cardiomyopathy    Prior inferior infarct EF 35% by ECHO 07/13/11 ; ECHO 03/2019: EF 30-35%    S/P CABG (coronary artery bypass graft)    CABG 06/1998 [place], [place] with LIMA to LAD, SVG to OM, SVG to PD-PL.   OM graft occluded 10/12/2005 by CTA    SCC (squamous cell carcinoma) 11/11/2016   in situ- R anti helix (CX35FU), well diff-R upper crust of helix (CX35FU)   Sleep apnea    Ventricular tachycardia (HCC) 10/15   VT at 188 bpm- unstable requiring external defibrillation    ROS:   All systems reviewed and negative except as noted in the HPI.   Past Surgical History:  Procedure Laterality Date   APPENDECTOMY     COLONOSCOPY  06/11/2010   Jolynn Pack; mild nonspecific erythematous mucosa in the descending colon s/p biopsied, diverticulosis, internal hemorrhoids.  Pathology with focal active colitis, nonspecific, main consideration includes acute infectious colitis.  NSAID induced colitis and/or Crohn's disease also in the differential but less likely.   CORONARY ARTERY BYPASS GRAFT  1999   IMPLANTABLE CARDIOVERTER DEFIBRILLATOR IMPLANT N/A 07/31/2014   MDT Sylvan XT DR ICD implanted by Dr Kelsie for secondary treatent of VT   LEFT HEART CATHETERIZATION WITH CORONARY/GRAFT ANGIOGRAM N/A 07/30/2014   Procedure: LEFT HEART CATHETERIZATION WITH EL BILE;  Surgeon: Elsie GORMAN Somerset, MD;  Location: [facility] CATH LAB;  Service: Cardiovascular;  Laterality: N/A;     Family History  Problem Relation Age of Onset   Dementia Mother    Cerebral aneurysm Father    Pneumonia Brother    Stroke Maternal Aunt    Diabetes Neg Hx    Heart attack Neg Hx    Hypertension Neg Hx    Colon cancer Neg Hx      Social History   Socioeconomic History   Marital status: Married    Spouse name: Barnie   Number of children: 2   Years of education: Not on file   Highest education level: Not on file  Occupational History   Occupation: retired  Tobacco Use   Smoking status: Never   Smokeless tobacco: Never  Vaping Use   Vaping status: Never Used  Substance and Sexual Activity    Alcohol use: Not Currently    Alcohol/week: 2.0 standard drinks of alcohol    Types: 2 Glasses of wine per week    Comment: Couple beers a day and wine with dinner.    Drug use: No  Sexual activity: Not on file  Other Topics Concern   Not on file  Social History Narrative   Not on file   Social Drivers of Health   Financial Resource Strain: Low Risk  (01/27/2024)   Received from [facility]   Overall Financial Resource Strain (CARDIA)    Difficulty of Paying Living Expenses: Not hard at all  Food Insecurity: No Food Insecurity (01/27/2024)   Received from [facility]   Hunger Vital Sign    Within the past 12 months, you worried that your food would run out before you got the money to buy more.: Never true    Within the past 12 months, the food you bought just didn't last and you didn't have money to get more.: Never true  Transportation Needs: No Transportation Needs (01/27/2024)   Received from [facility] - Transportation    Lack of Transportation (Medical): No    Lack of Transportation (Non-Medical): No  Physical Activity: Insufficiently Active (01/27/2024)   Received from [facility]   Exercise Vital Sign    On average, how many days per week do you engage in moderate to strenuous exercise (like a brisk walk)?: 3 days    On average, how many minutes do you engage in exercise at this level?: 30 min  Stress: No Stress Concern Present (01/27/2024)   Received from [facility] - Occupational Stress Questionnaire    Feeling of Stress : Not at all  Social Connections: Socially Integrated (01/27/2024)   Received from [facility]   Social Network    How would you rate your social network (family, work, friends)?: Good participation with social networks  Intimate Partner Violence: Not At Risk (01/27/2024)   Received from [facility]   HITS    Over the last 12 months how often did your partner physically hurt you?: Never     Over the last 12 months how often did your partner insult you or talk down to you?: Never    Over the last 12 months how often did your partner threaten you with physical harm?: Never    Over the last 12 months how often did your partner scream or curse at you?: Never     BP 126/78 (BP Location: Right Arm, Cuff Size: Normal)   Pulse 66   Ht 5' 8 (1.727 m)   Wt 190 lb 9.6 oz (86.5 kg)   SpO2 97%   BMI 28.98 kg/m   Physical Exam:  Well appearing NAD HEENT: Unremarkable Neck:  No JVD, no thyromegally Lymphatics:  No adenopathy Back:  No CVA tenderness Lungs:  Clear with no wheezes HEART:  Regular rate rhythm, no murmurs, no rubs, no clicks Abd:  soft, positive bowel sounds, no organomegally, no rebound, no guarding Ext:  2 plus pulses, no edema, no cyanosis, no clubbing Skin:  No rashes no nodules Neuro:  CN II through XII intact, motor grossly intact   DEVICE  Normal device function.  See PaceArt for details. Approaching ERI.  Assess/Plan:  1.  Chronic systolic dysfunction/ CAD/ ischemic CM euvolemic today Stable on an appropriate medical regimen Normal ICD function See Pace Art report No changes today he is not device dependant today followed in ICM device clinic   2. VT Controlled with sotalol  Qt is stable   3. OSA Uses dental appliance   Danelle Waddell COME

## 2024-09-23 ENCOUNTER — Ambulatory Visit: Attending: Internal Medicine

## 2024-09-23 DIAGNOSIS — Z95810 Presence of automatic (implantable) cardiac defibrillator: Secondary | ICD-10-CM | POA: Diagnosis not present

## 2024-09-23 DIAGNOSIS — I5022 Chronic systolic (congestive) heart failure: Secondary | ICD-10-CM

## 2024-09-24 NOTE — Progress Notes (Signed)
 EPIC Encounter for ICM Monitoring  Patient Name: Paul [name] Reasons is a 73 y.o. male Date: 09/24/2024 Primary Care Physican: Beam, Lamar POUR, MD Primary Cardiologist: Branch Electrophysiologist:  Waddell 05/16/2024 Weight:  182-183 lbs 06/20/2024 Weight:  183-184 lbs 09/24/2024 Weight: 183-184 lbs   Time in AT/AF   0.0 hr/day (0.0%)   Battery Longevity: 3 months          Spoke with patient and heart failure questions reviewed.  Transmission results reviewed.  Pt asymptomatic for fluid accumulation.   Most recent impedance more likely due to holiday foods but he feels fine.     Optivol thoracic impedance suggesting possible fluid accumulation starting 09/13/2024 and returning close to baseline 09/23/2024.  Also suggesting possible fluid accumulation from 08/17/2024-09/02/2024.   Prescribed: Hydrochlorothiazide  12.5 mg Take 1 capsule (12.5 mg total) by mouth as needed (weight gain 3 lbs or more in 24 hours, 5 lbs in a week or swelling).     Labs: 07/19/2024 Creatinine 1.26, BUN 11, Potassium 5.0, Sodium 135, GFR 60 Care Everywhere  01/29/2024 Creatinine 1.25, BUN 10, Potassium 4.8, Sodium 135, GFR 61 Care Everywhere  A complete set of results can be found in Results Review.   Recommendations:  No changes and encouraged to call if experiencing any fluid symptoms.   Follow-up plan: ICM clinic phone appointment on 11/04/2024.  91 day device clinic remote transmission 10/02/2024.     EP/Cardiology Office Visits:  Recall 03/02/2025 with Dr Alvan.   Recall 09/05/2025 with Dr Almetta.     Copy of ICM check sent to Dr. Waddell.      Remote monitoring is medically necessary for Heart Failure Management.    Daily Thoracic Impedance ICM trend: 06/24/2024 through 09/23/2024.    12-14 Month Thoracic Impedance ICM trend:     [name], RN 09/24/2024 10:45 AM

## 2024-10-02 ENCOUNTER — Ambulatory Visit

## 2024-10-03 ENCOUNTER — Ambulatory Visit: Payer: Self-pay | Admitting: Internal Medicine

## 2024-10-03 LAB — CUP PACEART REMOTE DEVICE CHECK
Battery Remaining Longevity: 2 mo
Battery Voltage: 2.83 V
Brady Statistic AP VP Percent: 0.02 %
Brady Statistic AP VS Percent: 3.91 %
Brady Statistic AS VP Percent: 0.11 %
Brady Statistic AS VS Percent: 95.97 %
Brady Statistic RA Percent Paced: 3.91 %
Brady Statistic RV Percent Paced: 0.13 %
Date Time Interrogation Session: 20251211123342
HighPow Impedance: 70 Ohm
Implantable Lead Connection Status: 753985
Implantable Lead Connection Status: 753985
Implantable Lead Implant Date: 20151008
Implantable Lead Implant Date: 20151008
Implantable Lead Location: 753859
Implantable Lead Location: 753860
Implantable Lead Model: 5076
Implantable Lead Model: 6935
Implantable Pulse Generator Implant Date: 20151008
Lead Channel Impedance Value: 285 Ohm
Lead Channel Impedance Value: 361 Ohm
Lead Channel Impedance Value: 418 Ohm
Lead Channel Pacing Threshold Amplitude: 1.25 V
Lead Channel Pacing Threshold Amplitude: 1.875 V
Lead Channel Pacing Threshold Pulse Width: 0.4 ms
Lead Channel Pacing Threshold Pulse Width: 0.4 ms
Lead Channel Sensing Intrinsic Amplitude: 1.25 mV
Lead Channel Sensing Intrinsic Amplitude: 1.25 mV
Lead Channel Sensing Intrinsic Amplitude: 7.5 mV
Lead Channel Sensing Intrinsic Amplitude: 7.5 mV
Lead Channel Setting Pacing Amplitude: 2.5 V
Lead Channel Setting Pacing Amplitude: 2.5 V
Lead Channel Setting Pacing Pulse Width: 0.8 ms
Lead Channel Setting Sensing Sensitivity: 0.3 mV
Zone Setting Status: 755011
Zone Setting Status: 755011

## 2024-10-09 NOTE — Progress Notes (Signed)
 Remote ICD Transmission

## 2024-11-02 ENCOUNTER — Ambulatory Visit

## 2024-11-03 ENCOUNTER — Other Ambulatory Visit: Payer: Self-pay | Admitting: Cardiovascular Disease

## 2024-11-03 DIAGNOSIS — I472 Ventricular tachycardia, unspecified: Secondary | ICD-10-CM

## 2024-11-03 DIAGNOSIS — Z95810 Presence of automatic (implantable) cardiac defibrillator: Secondary | ICD-10-CM

## 2024-11-04 ENCOUNTER — Ambulatory Visit: Attending: Student in an Organized Health Care Education/Training Program

## 2024-11-04 DIAGNOSIS — Z95810 Presence of automatic (implantable) cardiac defibrillator: Secondary | ICD-10-CM

## 2024-11-04 DIAGNOSIS — I5022 Chronic systolic (congestive) heart failure: Secondary | ICD-10-CM | POA: Diagnosis not present

## 2024-11-05 ENCOUNTER — Ambulatory Visit: Payer: Self-pay | Admitting: Student in an Organized Health Care Education/Training Program

## 2024-11-05 LAB — CUP PACEART REMOTE DEVICE CHECK
Battery Remaining Longevity: 2 mo
Battery Voltage: 2.83 V
Brady Statistic AP VP Percent: 0.02 %
Brady Statistic AP VS Percent: 4.43 %
Brady Statistic AS VP Percent: 0.12 %
Brady Statistic AS VS Percent: 95.44 %
Brady Statistic RA Percent Paced: 4.44 %
Brady Statistic RV Percent Paced: 0.14 %
Date Time Interrogation Session: 20260110044223
HighPow Impedance: 71 Ohm
Implantable Lead Connection Status: 753985
Implantable Lead Connection Status: 753985
Implantable Lead Implant Date: 20151008
Implantable Lead Implant Date: 20151008
Implantable Lead Location: 753859
Implantable Lead Location: 753860
Implantable Lead Model: 5076
Implantable Lead Model: 6935
Implantable Pulse Generator Implant Date: 20151008
Lead Channel Impedance Value: 285 Ohm
Lead Channel Impedance Value: 342 Ohm
Lead Channel Impedance Value: 418 Ohm
Lead Channel Pacing Threshold Amplitude: 1.25 V
Lead Channel Pacing Threshold Amplitude: 2 V
Lead Channel Pacing Threshold Pulse Width: 0.4 ms
Lead Channel Pacing Threshold Pulse Width: 0.4 ms
Lead Channel Sensing Intrinsic Amplitude: 1.25 mV
Lead Channel Sensing Intrinsic Amplitude: 1.25 mV
Lead Channel Sensing Intrinsic Amplitude: 9.125 mV
Lead Channel Sensing Intrinsic Amplitude: 9.125 mV
Lead Channel Setting Pacing Amplitude: 2.5 V
Lead Channel Setting Pacing Amplitude: 2.5 V
Lead Channel Setting Pacing Pulse Width: 0.8 ms
Lead Channel Setting Sensing Sensitivity: 0.3 mV
Zone Setting Status: 755011
Zone Setting Status: 755011

## 2024-11-06 NOTE — Progress Notes (Signed)
 EPIC Encounter for ICM Monitoring  Patient Name: Nash Bolls is a 74 y.o. male Date: 11/06/2024 Primary Care Physican: Beam, Lamar POUR, MD Primary Cardiologist: Branch Electrophysiologist:  Almetta 05/16/2024 Weight:  182-183 lbs 06/20/2024 Weight:  183-184 lbs 09/24/2024 Weight: 183-184 lbs   Time in AT/AF   0.0 hr/day (0.0%)   Battery Longevity: 2 months          Spoke with patient and heart failure questions reviewed.  Transmission results reviewed.  Pt asymptomatic for fluid accumulation.   He has a cold at this time but is feeling better.    Optivol thoracic impedance suggesting normal fluid levels within the last month.   Prescribed: Hydrochlorothiazide  12.5 mg Take 1 capsule (12.5 mg total) by mouth as needed (weight gain 3 lbs or more in 24 hours, 5 lbs in a week or swelling).     Labs: 07/19/2024 Creatinine 1.26, BUN 11, Potassium 5.0, Sodium 135, GFR 60 Care Everywhere  01/29/2024 Creatinine 1.25, BUN 10, Potassium 4.8, Sodium 135, GFR 61 Care Everywhere  A complete set of results can be found in Results Review.   Recommendations:  No changes and encouraged to call if experiencing any fluid symptoms.   Follow-up plan: ICM clinic phone appointment on 12/05/2024 (battery check 12/03/2024).  91 day device clinic remote transmission 01/04/2024.     EP/Cardiology Office Visits:  Recall 03/02/2025 with Dr Alvan.   Recall 09/05/2025 with Dr Almetta.     Copy of ICM check sent to Dr. Almetta.   Remote monitoring is medically necessary for Heart Failure Management.    Daily Thoracic Impedance ICM trend: 08/05/2024 through 11/04/2024.    12-14 Month Thoracic Impedance ICM trend:     [name], RN 11/06/2024 11:58 AM

## 2024-11-11 ENCOUNTER — Other Ambulatory Visit: Payer: Self-pay | Admitting: Cardiology

## 2024-11-18 MED ORDER — CLOPIDOGREL BISULFATE 75 MG PO TABS: 75.0000 mg | ORAL_TABLET | Freq: Every day | ORAL | 3 refills | Status: AC

## 2024-11-18 NOTE — Telephone Encounter (Signed)
 CBC and CMP completed on 07/19/24 Care Everywhere

## 2024-11-19 ENCOUNTER — Other Ambulatory Visit: Payer: Self-pay | Admitting: Cardiology

## 2024-11-21 NOTE — Progress Notes (Signed)
 31 day ICM Remote transmission canceled due to [facility] clinic is on hold until further notice.  91 day remote monitoring will continue per protocol.

## 2024-12-03 ENCOUNTER — Encounter

## 2024-12-05 ENCOUNTER — Ambulatory Visit

## 2025-01-03 ENCOUNTER — Encounter

## 2025-02-03 ENCOUNTER — Ambulatory Visit

## 2025-03-06 ENCOUNTER — Ambulatory Visit

## 2025-04-06 ENCOUNTER — Ambulatory Visit
